# Patient Record
Sex: Male | Born: 1960 | Race: White | Hispanic: No | Marital: Single | Smoking: Never smoker
Health system: Southern US, Community
[De-identification: ages and names within clinical notes are randomized; demographics above are authoritative.]

## PROBLEM LIST (undated history)

## (undated) DIAGNOSIS — I272 Pulmonary hypertension, unspecified: Secondary | ICD-10-CM

## (undated) DIAGNOSIS — I10 Essential (primary) hypertension: Secondary | ICD-10-CM

## (undated) DIAGNOSIS — E119 Type 2 diabetes mellitus without complications: Secondary | ICD-10-CM

## (undated) DIAGNOSIS — G4733 Obstructive sleep apnea (adult) (pediatric): Secondary | ICD-10-CM

## (undated) DIAGNOSIS — I38 Endocarditis, valve unspecified: Secondary | ICD-10-CM

## (undated) DIAGNOSIS — I428 Other cardiomyopathies: Secondary | ICD-10-CM

## (undated) DIAGNOSIS — I453 Trifascicular block: Secondary | ICD-10-CM

## (undated) DIAGNOSIS — I4892 Unspecified atrial flutter: Secondary | ICD-10-CM

## (undated) DIAGNOSIS — I519 Heart disease, unspecified: Secondary | ICD-10-CM

## (undated) DIAGNOSIS — N289 Disorder of kidney and ureter, unspecified: Secondary | ICD-10-CM

## (undated) HISTORY — DX: Heart disease, unspecified: I51.9

## (undated) HISTORY — DX: Morbid (severe) obesity due to excess calories: E66.01

## (undated) HISTORY — DX: Unspecified atrial flutter: I48.92

## (undated) HISTORY — DX: Endocarditis, valve unspecified: I38

## (undated) HISTORY — DX: Essential (primary) hypertension: I10

## (undated) HISTORY — DX: Disorder of kidney and ureter, unspecified: N28.9

## (undated) HISTORY — DX: Pulmonary hypertension, unspecified: I27.20

## (undated) HISTORY — DX: Trifascicular block: I45.3

## (undated) HISTORY — DX: Type 2 diabetes mellitus without complications: E11.9

## (undated) HISTORY — DX: Obstructive sleep apnea (adult) (pediatric): G47.33

## (undated) HISTORY — DX: Other cardiomyopathies: I42.8

---

## 2004-10-31 ENCOUNTER — Ambulatory Visit: Payer: Self-pay | Admitting: Cardiology

## 2006-03-12 ENCOUNTER — Ambulatory Visit: Payer: Self-pay | Admitting: Cardiology

## 2006-03-15 ENCOUNTER — Ambulatory Visit: Payer: Self-pay | Admitting: Cardiology

## 2006-03-22 ENCOUNTER — Ambulatory Visit: Payer: Self-pay | Admitting: Cardiology

## 2006-03-29 ENCOUNTER — Ambulatory Visit: Payer: Self-pay | Admitting: Cardiology

## 2006-04-05 ENCOUNTER — Ambulatory Visit: Payer: Self-pay | Admitting: Cardiology

## 2006-04-12 ENCOUNTER — Ambulatory Visit: Payer: Self-pay | Admitting: Cardiology

## 2006-04-20 ENCOUNTER — Ambulatory Visit: Payer: Self-pay | Admitting: Cardiology

## 2006-04-26 ENCOUNTER — Ambulatory Visit: Payer: Self-pay | Admitting: Cardiology

## 2006-05-03 ENCOUNTER — Ambulatory Visit: Payer: Self-pay | Admitting: Cardiology

## 2006-05-10 ENCOUNTER — Ambulatory Visit: Payer: Self-pay | Admitting: Cardiology

## 2006-05-17 ENCOUNTER — Ambulatory Visit: Payer: Self-pay | Admitting: Cardiology

## 2006-05-24 ENCOUNTER — Ambulatory Visit: Payer: Self-pay | Admitting: Cardiology

## 2006-05-28 ENCOUNTER — Ambulatory Visit: Payer: Self-pay | Admitting: Cardiology

## 2006-05-31 ENCOUNTER — Ambulatory Visit: Payer: Self-pay | Admitting: Cardiology

## 2006-06-02 ENCOUNTER — Ambulatory Visit: Payer: Self-pay | Admitting: Cardiology

## 2006-06-09 ENCOUNTER — Ambulatory Visit: Payer: Self-pay | Admitting: Cardiology

## 2006-06-23 ENCOUNTER — Ambulatory Visit: Payer: Self-pay | Admitting: Cardiology

## 2006-07-07 ENCOUNTER — Ambulatory Visit: Payer: Self-pay | Admitting: Cardiology

## 2006-07-14 ENCOUNTER — Ambulatory Visit: Payer: Self-pay | Admitting: Cardiology

## 2006-07-28 ENCOUNTER — Ambulatory Visit: Payer: Self-pay | Admitting: Cardiology

## 2006-08-04 ENCOUNTER — Ambulatory Visit: Payer: Self-pay | Admitting: Cardiology

## 2006-08-16 ENCOUNTER — Ambulatory Visit: Payer: Self-pay | Admitting: Cardiology

## 2006-08-27 ENCOUNTER — Ambulatory Visit: Payer: Self-pay | Admitting: Cardiology

## 2006-09-06 ENCOUNTER — Encounter: Payer: Self-pay | Admitting: Cardiology

## 2006-09-07 ENCOUNTER — Ambulatory Visit: Payer: Self-pay | Admitting: Cardiology

## 2006-09-17 ENCOUNTER — Ambulatory Visit: Payer: Self-pay | Admitting: Cardiology

## 2006-09-27 ENCOUNTER — Ambulatory Visit: Payer: Self-pay | Admitting: Cardiology

## 2006-10-01 ENCOUNTER — Ambulatory Visit: Payer: Self-pay | Admitting: Cardiology

## 2006-10-01 ENCOUNTER — Inpatient Hospital Stay (HOSPITAL_BASED_OUTPATIENT_CLINIC_OR_DEPARTMENT_OTHER): Admission: RE | Admit: 2006-10-01 | Discharge: 2006-10-01 | Payer: Self-pay | Admitting: Cardiology

## 2006-10-15 ENCOUNTER — Ambulatory Visit: Payer: Self-pay | Admitting: Cardiology

## 2006-11-08 ENCOUNTER — Ambulatory Visit: Payer: Self-pay | Admitting: Cardiology

## 2006-11-15 ENCOUNTER — Ambulatory Visit: Payer: Self-pay | Admitting: Cardiology

## 2006-12-01 ENCOUNTER — Ambulatory Visit: Payer: Self-pay | Admitting: Cardiology

## 2006-12-22 ENCOUNTER — Ambulatory Visit: Payer: Self-pay | Admitting: Cardiology

## 2007-01-05 ENCOUNTER — Ambulatory Visit: Payer: Self-pay | Admitting: Physician Assistant

## 2007-01-13 ENCOUNTER — Ambulatory Visit: Payer: Self-pay | Admitting: Physician Assistant

## 2007-01-28 ENCOUNTER — Ambulatory Visit: Payer: Self-pay | Admitting: Physician Assistant

## 2007-02-07 ENCOUNTER — Ambulatory Visit: Payer: Self-pay | Admitting: Cardiology

## 2007-02-16 ENCOUNTER — Ambulatory Visit: Payer: Self-pay | Admitting: Cardiology

## 2007-02-21 ENCOUNTER — Ambulatory Visit: Payer: Self-pay | Admitting: Physician Assistant

## 2007-03-07 ENCOUNTER — Ambulatory Visit: Payer: Self-pay | Admitting: Cardiology

## 2007-04-04 ENCOUNTER — Ambulatory Visit: Payer: Self-pay | Admitting: Cardiology

## 2007-05-04 ENCOUNTER — Ambulatory Visit: Payer: Self-pay | Admitting: Physician Assistant

## 2007-05-18 ENCOUNTER — Ambulatory Visit: Payer: Self-pay | Admitting: Cardiology

## 2007-06-08 ENCOUNTER — Ambulatory Visit: Payer: Self-pay | Admitting: Cardiology

## 2007-06-22 ENCOUNTER — Ambulatory Visit: Payer: Self-pay | Admitting: Cardiology

## 2007-07-13 ENCOUNTER — Ambulatory Visit: Payer: Self-pay | Admitting: Cardiology

## 2007-08-10 ENCOUNTER — Ambulatory Visit: Payer: Self-pay | Admitting: Cardiology

## 2007-09-15 ENCOUNTER — Ambulatory Visit: Payer: Self-pay | Admitting: Cardiology

## 2007-10-13 ENCOUNTER — Ambulatory Visit: Payer: Self-pay | Admitting: Cardiology

## 2007-11-03 ENCOUNTER — Ambulatory Visit: Payer: Self-pay | Admitting: Cardiology

## 2008-02-10 ENCOUNTER — Ambulatory Visit: Payer: Self-pay | Admitting: Cardiology

## 2008-03-05 ENCOUNTER — Ambulatory Visit: Payer: Self-pay | Admitting: Cardiology

## 2008-03-23 ENCOUNTER — Ambulatory Visit: Payer: Self-pay | Admitting: Cardiology

## 2008-04-24 ENCOUNTER — Ambulatory Visit: Payer: Self-pay | Admitting: Cardiology

## 2008-05-08 ENCOUNTER — Ambulatory Visit: Payer: Self-pay | Admitting: Cardiology

## 2008-06-01 ENCOUNTER — Ambulatory Visit: Payer: Self-pay | Admitting: Cardiology

## 2008-06-22 ENCOUNTER — Ambulatory Visit: Payer: Self-pay | Admitting: Cardiology

## 2008-06-25 ENCOUNTER — Ambulatory Visit: Payer: Self-pay | Admitting: Cardiology

## 2008-06-29 ENCOUNTER — Ambulatory Visit: Payer: Self-pay | Admitting: Cardiology

## 2008-07-20 ENCOUNTER — Ambulatory Visit: Payer: Self-pay | Admitting: Cardiology

## 2008-08-21 ENCOUNTER — Ambulatory Visit: Payer: Self-pay | Admitting: Cardiology

## 2008-08-22 ENCOUNTER — Encounter: Payer: Self-pay | Admitting: Cardiology

## 2008-08-25 ENCOUNTER — Encounter: Payer: Self-pay | Admitting: Cardiology

## 2008-08-31 ENCOUNTER — Ambulatory Visit: Payer: Self-pay | Admitting: Cardiology

## 2008-09-04 ENCOUNTER — Ambulatory Visit: Payer: Self-pay | Admitting: Cardiology

## 2008-09-10 ENCOUNTER — Encounter: Payer: Self-pay | Admitting: Cardiology

## 2008-09-12 ENCOUNTER — Ambulatory Visit: Payer: Self-pay | Admitting: Cardiology

## 2008-10-02 ENCOUNTER — Ambulatory Visit: Payer: Self-pay | Admitting: Cardiology

## 2008-10-18 ENCOUNTER — Ambulatory Visit: Payer: Self-pay | Admitting: Cardiology

## 2008-10-23 ENCOUNTER — Ambulatory Visit: Payer: Self-pay | Admitting: Cardiology

## 2008-11-13 ENCOUNTER — Ambulatory Visit: Payer: Self-pay | Admitting: Cardiology

## 2008-11-15 ENCOUNTER — Encounter: Payer: Self-pay | Admitting: Cardiology

## 2008-12-04 ENCOUNTER — Ambulatory Visit: Payer: Self-pay | Admitting: Cardiology

## 2008-12-18 ENCOUNTER — Ambulatory Visit: Payer: Self-pay | Admitting: Cardiology

## 2008-12-24 ENCOUNTER — Encounter: Payer: Self-pay | Admitting: Cardiology

## 2009-01-15 ENCOUNTER — Ambulatory Visit: Payer: Self-pay | Admitting: Cardiology

## 2009-02-05 ENCOUNTER — Ambulatory Visit: Payer: Self-pay | Admitting: Cardiology

## 2009-03-05 ENCOUNTER — Ambulatory Visit: Payer: Self-pay

## 2009-04-01 ENCOUNTER — Encounter: Payer: Self-pay | Admitting: *Deleted

## 2009-04-02 ENCOUNTER — Ambulatory Visit: Payer: Self-pay | Admitting: Cardiology

## 2009-04-02 LAB — CONVERTED CEMR LAB: POC INR: 2.5

## 2009-04-30 ENCOUNTER — Ambulatory Visit: Payer: Self-pay | Admitting: Cardiology

## 2009-05-06 DIAGNOSIS — I10 Essential (primary) hypertension: Secondary | ICD-10-CM

## 2009-05-06 DIAGNOSIS — I429 Cardiomyopathy, unspecified: Secondary | ICD-10-CM

## 2009-05-06 DIAGNOSIS — I4892 Unspecified atrial flutter: Secondary | ICD-10-CM

## 2009-05-17 ENCOUNTER — Ambulatory Visit: Payer: Self-pay | Admitting: Cardiology

## 2009-05-17 DIAGNOSIS — I872 Venous insufficiency (chronic) (peripheral): Secondary | ICD-10-CM | POA: Insufficient documentation

## 2009-05-24 ENCOUNTER — Encounter: Payer: Self-pay | Admitting: Cardiology

## 2009-05-28 ENCOUNTER — Ambulatory Visit: Payer: Self-pay | Admitting: Cardiology

## 2009-05-28 LAB — CONVERTED CEMR LAB: POC INR: 2.2

## 2009-06-04 ENCOUNTER — Encounter (INDEPENDENT_AMBULATORY_CARE_PROVIDER_SITE_OTHER): Payer: Self-pay | Admitting: *Deleted

## 2009-06-25 ENCOUNTER — Ambulatory Visit: Payer: Self-pay | Admitting: Cardiology

## 2009-06-25 LAB — CONVERTED CEMR LAB: POC INR: 2.6

## 2009-07-26 ENCOUNTER — Ambulatory Visit: Payer: Self-pay | Admitting: Cardiology

## 2009-07-26 LAB — CONVERTED CEMR LAB: POC INR: 2.1

## 2009-09-03 ENCOUNTER — Ambulatory Visit: Payer: Self-pay | Admitting: Cardiology

## 2009-10-01 ENCOUNTER — Ambulatory Visit: Payer: Self-pay | Admitting: Cardiology

## 2009-10-22 ENCOUNTER — Ambulatory Visit: Payer: Self-pay | Admitting: Cardiology

## 2009-10-22 LAB — CONVERTED CEMR LAB: POC INR: 3.5

## 2009-11-08 ENCOUNTER — Ambulatory Visit: Payer: Self-pay | Admitting: Cardiology

## 2009-12-10 ENCOUNTER — Ambulatory Visit: Payer: Self-pay | Admitting: Cardiology

## 2009-12-18 ENCOUNTER — Encounter: Payer: Self-pay | Admitting: Cardiology

## 2009-12-24 ENCOUNTER — Ambulatory Visit: Payer: Self-pay | Admitting: Cardiology

## 2009-12-25 ENCOUNTER — Encounter (INDEPENDENT_AMBULATORY_CARE_PROVIDER_SITE_OTHER): Payer: Self-pay | Admitting: *Deleted

## 2010-01-07 ENCOUNTER — Ambulatory Visit: Payer: Self-pay | Admitting: Cardiology

## 2010-01-07 LAB — CONVERTED CEMR LAB: POC INR: 2.8

## 2010-01-15 ENCOUNTER — Encounter (INDEPENDENT_AMBULATORY_CARE_PROVIDER_SITE_OTHER): Payer: Self-pay | Admitting: *Deleted

## 2010-02-07 ENCOUNTER — Ambulatory Visit: Payer: Self-pay | Admitting: Cardiology

## 2010-03-07 ENCOUNTER — Ambulatory Visit: Payer: Self-pay | Admitting: Cardiology

## 2010-03-07 LAB — CONVERTED CEMR LAB: POC INR: 3

## 2010-04-04 ENCOUNTER — Ambulatory Visit: Payer: Self-pay | Admitting: Cardiology

## 2010-04-04 LAB — CONVERTED CEMR LAB: POC INR: 3

## 2010-05-02 ENCOUNTER — Ambulatory Visit: Payer: Self-pay | Admitting: Cardiology

## 2010-05-02 LAB — CONVERTED CEMR LAB: POC INR: 3.1

## 2010-06-03 ENCOUNTER — Ambulatory Visit: Payer: Self-pay | Admitting: Cardiology

## 2010-06-03 ENCOUNTER — Encounter: Payer: Self-pay | Admitting: Cardiology

## 2010-06-03 DIAGNOSIS — I4891 Unspecified atrial fibrillation: Secondary | ICD-10-CM

## 2010-06-04 ENCOUNTER — Encounter (INDEPENDENT_AMBULATORY_CARE_PROVIDER_SITE_OTHER): Payer: Self-pay | Admitting: *Deleted

## 2010-06-06 ENCOUNTER — Encounter: Payer: Self-pay | Admitting: Cardiology

## 2010-06-20 ENCOUNTER — Ambulatory Visit: Payer: Self-pay | Admitting: Cardiology

## 2010-07-01 ENCOUNTER — Encounter: Payer: Self-pay | Admitting: Cardiology

## 2010-07-04 ENCOUNTER — Encounter (INDEPENDENT_AMBULATORY_CARE_PROVIDER_SITE_OTHER): Payer: Self-pay | Admitting: *Deleted

## 2010-09-16 NOTE — Letter (Signed)
Summary: Appointment -missed  [facility] at [facility] [address]   [place], [place] [address]   Phone: [phone]  Fax: [phone]     July 01, 2010 MRN: [id number]     HARJOT DIBELLO [address] [place], [place]  [address]     Dear Mr. LEDFORD,  Our records indicate you missed your appointment on July 01, 2010                        with coumadin clinic.   It is very important that we reach you to reschedule this appointment. We look forward to participating in your health care needs.   Please contact us at the number listed above at your earliest convenience to reschedule this appointment.   Sincerely,    Glass blower/designer

## 2010-09-16 NOTE — Medication Information (Signed)
Summary: RX Folder/ PRADAXA  RX Folder/ PRADAXA   Imported By: Dorise Hiss 06/09/2010 15:18:21  _____________________________________________________________________  External Attachment:    Type:   Image     Comment:   External Document

## 2010-09-16 NOTE — Medication Information (Signed)
Summary: ccr-lr  Anticoagulant Therapy  Managed by: Vashti Hey, RN Referring MD: Lewayne Bunting PCP: Larina Earthly MD: Andee Lineman MD, Michelle Piper Indication 1: Atrial Fibrillation (ICD-427.31) [facility] [place] Site: [facility] INR POC 3.0  Dietary changes: no    Health status changes: no    Bleeding/hemorrhagic complications: no    Recent/future hospitalizations: no    Any changes in medication regimen? no    Recent/future dental: no  Any missed doses?: no       Is patient compliant with meds? yes       Allergies: No Known Drug Allergies  Anticoagulation Management History:      The patient is taking warfarin and comes in today for a routine follow up visit.  Negative risk factors for bleeding include an age less than 55 years old.  The bleeding index is 'low risk'.  Positive CHADS2 values include History of HTN.  Negative CHADS2 values include Age > 37 years old.  The start date was 07/17/2006.  Anticoagulation responsible provider: Andee Lineman MD, Michelle Piper.  INR POC: 3.0.  Cuvette Lot#: [id number].  Exp: 10/11.    Anticoagulation Management Assessment/Plan:      The patient's current anticoagulation dose is Warfarin sodium 5 mg tabs: take 2 tablets by mouth once daily as directed.  The target INR is 2 - 3.  The next INR is due 10/01/2009.  Anticoagulation instructions were given to patient.  Results were reviewed/authorized by Vashti Hey, RN.  He was notified by Vashti Hey RN.         Prior Anticoagulation Instructions: INR 2.1 Continue coumadin 10mg  once daily except 12.5mg  on Mondays  Current Anticoagulation Instructions: INR 3.0 Continue coumadin 10mg  once daily except 12.5mg  on Mondays

## 2010-09-16 NOTE — Medication Information (Signed)
Summary: ccr-lr  Anticoagulant Therapy  Managed by: Vashti Hey, RN Referring MD: Lewayne Bunting PCP: Larina Earthly MD: Andee Lineman MD, Michelle Piper Indication 1: Atrial Fibrillation (ICD-427.31) Lab Used: [facility] [place] Site: [facility] Clinic INR POC 2.9  Dietary changes: no    Health status changes: no    Bleeding/hemorrhagic complications: no    Recent/future hospitalizations: no    Any changes in medication regimen? no    Recent/future dental: no  Any missed doses?: no       Is patient compliant with meds? yes       Allergies: No Known Drug Allergies  Anticoagulation Management History:      The patient is taking warfarin and comes in today for a routine follow up visit.  Negative risk factors for bleeding include an age less than 32 years old.  The bleeding index is 'low risk'.  Positive CHADS2 values include History of HTN.  Negative CHADS2 values include Age > 21 years old.  The start date was 07/17/2006.  Anticoagulation responsible provider: Andee Lineman MD, Michelle Piper.  INR POC: 2.9.  Cuvette Lot#: [id number].  Exp: 03/2011.    Anticoagulation Management Assessment/Plan:      The patient's current anticoagulation dose is Warfarin sodium 5 mg tabs: take 2 tablets by mouth once daily as directed.  The target INR is 2 - 3.  The next INR is due 07/01/2010.  Anticoagulation instructions were given to patient.  Results were reviewed/authorized by Vashti Hey, RN.  He was notified by Vashti Hey RN.         Prior Anticoagulation Instructions: INR 3.1 Decrease dose to 10mg  once daily except 12.5mg  on Tuesdays  Current Anticoagulation Instructions: INR 2.9 Continue coumadin 10mg  once daily except 12.5mg  on Tuesdays

## 2010-09-16 NOTE — Letter (Signed)
Summary: Generic Engineer, agricultural at [facility] [address]   [place], [place] [address]   Phone: [phone]  Fax: [phone]        June 04, 2010 MRN: [id number]    DEAVON PODGORSKI [address] [place], [place]  [address]    Dear Mr. RUBEN,   Zigmund Gottron is a copy of your order for the 2 D echo ordered by Dr. Andee Lineman.  If this date and time are not suitable with you please call our office and I will be happy to re-schedule this time for you.        Sincerely,  Zachary George Patient Care Coordinator

## 2010-09-16 NOTE — Medication Information (Signed)
Summary: ccr-at [facility] appt-lr  Anticoagulant Therapy  Managed by: Vashti Hey, RN Referring MD: Lewayne Bunting PCP: Larina Earthly MD: Andee Lineman MD, Michelle Piper Indication 1: Atrial Fibrillation (ICD-427.31) Lab Used: [facility] [place] Site: [facility] INR POC 1.4  Dietary changes: no    Health status changes: no    Bleeding/hemorrhagic complications: no    Recent/future hospitalizations: no    Any changes in medication regimen? no    Recent/future dental: no  Any missed doses?: no       Is patient compliant with meds? yes       Allergies: No Known Drug Allergies  Anticoagulation Management History:      The patient is taking warfarin and comes in today for a routine follow up visit.  Negative risk factors for bleeding include an age less than 24 years old.  The bleeding index is 'low risk'.  Positive CHADS2 values include History of HTN.  Negative CHADS2 values include Age > 60 years old.  The start date was 07/17/2006.  Anticoagulation responsible provider: Andee Lineman MD, Michelle Piper.  INR POC: 1.4.  Cuvette Lot#: [id number].  Exp: 10/11.    Anticoagulation Management Assessment/Plan:      The patient's current anticoagulation dose is Warfarin sodium 5 mg tabs: take 2 tablets by mouth once daily as directed.  The target INR is 2 - 3.  The next INR is due 12/24/2009.  Anticoagulation instructions were given to patient.  Results were reviewed/authorized by Vashti Hey, RN.  He was notified by Vashti Hey RN.         Prior Anticoagulation Instructions: INR 2.7 Continue coumadin 10mg  once daily except 5mg  on Wednesdays and Saturdays  Current Anticoagulation Instructions: INR 1.4 Take coumadin 15mg  tonight then increase dose to 10mg  once daily

## 2010-09-16 NOTE — Medication Information (Signed)
Summary: ccr-lr  Anticoagulant Therapy  Managed by: Vashti Hey, RN Referring MD: Lewayne Bunting PCP: Larina Earthly MD: Antoine Poche MD, Fayrene Fearing Indication 1: Atrial Fibrillation (ICD-427.31) Lab Used: [facility] [place] Site: [facility] Care Clinic INR POC 3.5  Dietary changes: no    Health status changes: no    Bleeding/hemorrhagic complications: no    Recent/future hospitalizations: no    Any changes in medication regimen? no    Recent/future dental: no  Any missed doses?: no       Is patient compliant with meds? yes       Allergies: No Known Drug Allergies  Anticoagulation Management History:      The patient is taking warfarin and comes in today for a routine follow up visit.  Negative risk factors for bleeding include an age less than 52 years old.  The bleeding index is 'low risk'.  Positive CHADS2 values include History of HTN.  Negative CHADS2 values include Age > 52 years old.  The start date was 07/17/2006.  Anticoagulation responsible provider: Antoine Poche MD, Fayrene Fearing.  INR POC: 3.5.  Cuvette Lot#: [id number].  Exp: 10/11.    Anticoagulation Management Assessment/Plan:      The patient's current anticoagulation dose is Warfarin sodium 5 mg tabs: take 2 tablets by mouth once daily as directed.  The target INR is 2 - 3.  The next INR is due 10/22/2009.  Anticoagulation instructions were given to patient.  Results were reviewed/authorized by Vashti Hey, RN.  He was notified by Vashti Hey RN.         Prior Anticoagulation Instructions: INR 3.0 Continue coumadin 10mg  once daily except 12.5mg  on Mondays  Current Anticoagulation Instructions: INR 3.5 Pt has taken coumadin today Take coumadin 2.5mg  tomorrow, then decrease dose to 10mg  once daily

## 2010-09-16 NOTE — Medication Information (Signed)
Summary: ccr-lr  Anticoagulant Therapy  Managed by: Weston Brass, PharmD Referring MD: Lewayne Bunting PCP: Larina Earthly MD: Andee Lineman MD, Michelle Piper Indication 1: Atrial Fibrillation (ICD-427.31) Lab Used: [facility] [place] Site: [facility] Care Clinic INR POC 2.4  Dietary changes: no    Health status changes: no    Bleeding/hemorrhagic complications: no    Recent/future hospitalizations: no    Any changes in medication regimen? no    Recent/future dental: no  Any missed doses?: no       Is patient compliant with meds? yes       Allergies: No Known Drug Allergies  Anticoagulation Management History:      The patient is taking warfarin and comes in today for a routine follow up visit.  Negative risk factors for bleeding include an age less than 57 years old.  The bleeding index is 'low risk'.  Positive CHADS2 values include History of HTN.  Negative CHADS2 values include Age > 79 years old.  The start date was 07/17/2006.  Anticoagulation responsible provider: Andee Lineman MD, Michelle Piper.  INR POC: 2.4.  Cuvette Lot#: [id number].  Exp: 03/2011.    Anticoagulation Management Assessment/Plan:      The patient's current anticoagulation dose is Warfarin sodium 5 mg tabs: take 2 tablets by mouth once daily as directed.  The target INR is 2 - 3.  The next INR is due 03/07/2010.  Anticoagulation instructions were given to patient.  Results were reviewed/authorized by Weston Brass, PharmD.  He was notified by Weston Brass PharmD.         Prior Anticoagulation Instructions: INR 2.8 Continue coumadin 10mg  once daily except 12.5mg  on Tuesdays and Fridays  Current Anticoagulation Instructions: INR 2.4  Continue same dose of 2 tablets every day except 2 1/2 tablets on Tuesday and Friday.

## 2010-09-16 NOTE — Medication Information (Signed)
Summary: Coumadin Clinic  Anticoagulant Therapy  Managed by: Inactive Referring MD: Lewayne Bunting PCP: Larina Earthly MD: Andee Lineman MD, Michelle Piper Indication 1: Atrial Fibrillation (ICD-427.31) Lab Used: [facility] [place] Site: [facility] Care Clinic          Comments: Pt switched to Pradaxa per Dr Andee Lineman.  Allergies: No Known Drug Allergies  Anticoagulation Management History:      Negative risk factors for bleeding include an age less than 23 years old.  The bleeding index is 'low risk'.  Positive CHADS2 values include History of HTN.  Negative CHADS2 values include Age > 70 years old.  The start date was 07/17/2006.  Anticoagulation responsible provider: Andee Lineman MD, Michelle Piper.  Exp: 03/2011.    Anticoagulation Management Assessment/Plan:      The patient's current anticoagulation dose is Warfarin sodium 5 mg tabs: take 2 tablets by mouth once daily as directed.  The target INR is 2 - 3.  The next INR is due 07/01/2010.  Anticoagulation instructions were given to patient.  Results were reviewed/authorized by Inactive.         Prior Anticoagulation Instructions: INR 2.9 Continue coumadin 10mg  once daily except 12.5mg  on Tuesdays

## 2010-09-16 NOTE — Letter (Signed)
Summary: Appointment- Rescheduled  [facility] at [facility] [address]   [place], [place] [address]   Phone: [phone]  Fax: [phone]     January 15, 2010 MRN: [id number]     [name] [address] [place], [place]  [address]     Dear Mr. CROMARTIE,   Due to a change in our office schedule, your appointment on February 04, 2010 at 9:15 a.m. must be changed.  Your new appointment is February 07, 2010 at 9:15 a.m.   We look forward to participating in your health care needs.       Sincerely,  Glass blower/designer

## 2010-09-16 NOTE — Letter (Signed)
Summary: Engineer, materials at [facility]  [address]   [place], [place] [address]   Phone: [phone]  Fax: [phone]        July 04, 2010 MRN: [id number]   Carl ZANE [address] [place], [place]  [address]   Dear Mr. [name],  Your test ordered by Selena Batten has been reviewed by your physician (or physician assistant) and was found to be normal or stable. Your physician (or physician assistant) felt no changes were needed at this time.  __X__ Echocardiogram  ____ Cardiac Stress Test  ____ Lab Work  ____ Peripheral vascular study of arms, legs or neck  ____ CT scan or X-ray  ____ Lung or Breathing test  ____ Other:   Thank you.   Laray Anger, M.D., F.A.C.C.

## 2010-09-16 NOTE — Medication Information (Signed)
Summary: ccr-lr  Anticoagulant Therapy  Managed by: Vashti Hey, RN Referring MD: Lewayne Bunting PCP: Larina Earthly MD: Andee Lineman MD, Michelle Piper Indication 1: Atrial Fibrillation (ICD-427.31) Lab Used: [facility] [place] Site: [facility] Care Clinic INR POC 2.7  Dietary changes: no    Health status changes: no    Bleeding/hemorrhagic complications: no    Recent/future hospitalizations: no    Any changes in medication regimen? no    Recent/future dental: no  Any missed doses?: no       Is patient compliant with meds? yes       Allergies: No Known Drug Allergies  Anticoagulation Management History:      The patient is taking warfarin and comes in today for a routine follow up visit.  Negative risk factors for bleeding include an age less than 95 years old.  The bleeding index is 'low risk'.  Positive CHADS2 values include History of HTN.  Negative CHADS2 values include Age > 63 years old.  The start date was 07/17/2006.  Anticoagulation responsible provider: Andee Lineman MD, Michelle Piper.  INR POC: 2.7.  Cuvette Lot#: [id number].  Exp: 10/11.    Anticoagulation Management Assessment/Plan:      The patient's current anticoagulation dose is Warfarin sodium 5 mg tabs: take 2 tablets by mouth once daily as directed.  The target INR is 2 - 3.  The next INR is due 12/10/2009.  Anticoagulation instructions were given to patient.  Results were reviewed/authorized by Vashti Hey, RN.  He was notified by Vashti Hey RN.         Prior Anticoagulation Instructions: INR 3.5 Pt has lost 30+ pounds Decrease coumadin to 10mg  qd except 5mg  on Wednesdays and Saturdays   Current Anticoagulation Instructions: INR 2.7 Continue coumadin 10mg  once daily except 5mg  on Wednesdays and Saturdays

## 2010-09-16 NOTE — Medication Information (Signed)
Summary: ccr-lr  Anticoagulant Therapy  Managed by: Vashti Hey, RN Referring MD: Lewayne Bunting PCP: Larina Earthly MD: Andee Lineman MD, Michelle Piper Indication 1: Atrial Fibrillation (ICD-427.31) Lab Used: [facility] [place] Site: [facility] of Care Clinic INR POC 1.7  Dietary changes: no    Health status changes: no    Bleeding/hemorrhagic complications: no    Recent/future hospitalizations: no    Any changes in medication regimen? no    Recent/future dental: no  Any missed doses?: no       Is patient compliant with meds? yes       Allergies: No Known Drug Allergies  Anticoagulation Management History:      The patient is taking warfarin and comes in today for a routine follow up visit.  Negative risk factors for bleeding include an age less than 52 years old.  The bleeding index is 'low risk'.  Positive CHADS2 values include History of HTN.  Negative CHADS2 values include Age > 74 years old.  The start date was 07/17/2006.  Anticoagulation responsible provider: Andee Lineman MD, Michelle Piper.  INR POC: 1.7.  Cuvette Lot#: [id number].  Exp: 10/11.    Anticoagulation Management Assessment/Plan:      The patient's current anticoagulation dose is Warfarin sodium 5 mg tabs: take 2 tablets by mouth once daily as directed.  The target INR is 2 - 3.  The next INR is due 01/07/2010.  Anticoagulation instructions were given to patient.  Results were reviewed/authorized by Vashti Hey, RN.  He was notified by Vashti Hey RN.         Prior Anticoagulation Instructions: INR 1.4 Take coumadin 15mg  tonight then increase dose to 10mg  once daily   Current Anticoagulation Instructions: INR 1.7 Take coumadin 15mg  tonight then increase dose to 10mg  once daily except 12.5mg  on Tuesdays and Fridays

## 2010-09-16 NOTE — Medication Information (Signed)
Summary: ccr-lr  Anticoagulant Therapy  Managed by: Vashti Hey, RN Referring MD: Lewayne Bunting PCP: Larina Earthly MD: Andee Lineman MD, Michelle Piper Indication 1: Atrial Fibrillation (ICD-427.31) Lab Used: [facility] [place] Site: [facility] Clinic INR POC 3.0  Dietary changes: no    Health status changes: no    Bleeding/hemorrhagic complications: no    Recent/future hospitalizations: no    Any changes in medication regimen? no    Recent/future dental: no  Any missed doses?: no       Is patient compliant with meds? yes       Allergies: No Known Drug Allergies  Anticoagulation Management History:      The patient is taking warfarin and comes in today for a routine follow up visit.  Negative risk factors for bleeding include an age less than 65 years old.  The bleeding index is 'low risk'.  Positive CHADS2 values include History of HTN.  Negative CHADS2 values include Age > 3 years old.  The start date was 07/17/2006.  Anticoagulation responsible provider: Andee Lineman MD, Michelle Piper.  INR POC: 3.0.  Cuvette Lot#: [id number].  Exp: 03/2011.    Anticoagulation Management Assessment/Plan:      The patient's current anticoagulation dose is Warfarin sodium 5 mg tabs: take 2 tablets by mouth once daily as directed.  The target INR is 2 - 3.  The next INR is due 05/02/2010.  Anticoagulation instructions were given to patient.  Results were reviewed/authorized by Vashti Hey, RN.  He was notified by Vashti Hey RN.         Prior Anticoagulation Instructions: INR 3.0 Continue coumadin 10mg  once daily except 12.5mg  on Tuesdays and Fridays  Current Anticoagulation Instructions: Same as Prior Instructions.

## 2010-09-16 NOTE — Medication Information (Signed)
Summary: ccr-lr  Anticoagulant Therapy  Managed by: Vashti Hey, RN Referring MD: Lewayne Bunting PCP: Larina Earthly MD: Andee Lineman MD, Michelle Piper Indication 1: Atrial Fibrillation (ICD-427.31) Lab Used: [facility] [place] Site: [facility] Clinic INR POC 3.5  Dietary changes: no    Health status changes: no    Bleeding/hemorrhagic complications: no    Recent/future hospitalizations: no    Any changes in medication regimen? no    Recent/future dental: no  Any missed doses?: no       Is patient compliant with meds? yes       Allergies: No Known Drug Allergies  Anticoagulation Management History:      The patient is taking warfarin and comes in today for a routine follow up visit.  Negative risk factors for bleeding include an age less than 77 years old.  The bleeding index is 'low risk'.  Positive CHADS2 values include History of HTN.  Negative CHADS2 values include Age > 80 years old.  The start date was 07/17/2006.  Anticoagulation responsible provider: Andee Lineman MD, Michelle Piper.  INR POC: 3.5.  Cuvette Lot#: [id number].  Exp: 10/11.    Anticoagulation Management Assessment/Plan:      The patient's current anticoagulation dose is Warfarin sodium 5 mg tabs: take 2 tablets by mouth once daily as directed.  The target INR is 2 - 3.  The next INR is due 11/08/2009.  Anticoagulation instructions were given to patient.  Results were reviewed/authorized by Vashti Hey, RN.  He was notified by Vashti Hey RN.         Prior Anticoagulation Instructions: INR 3.5 Pt has taken coumadin today Take coumadin 2.5mg  tomorrow, then decrease dose to 10mg  once daily   Current Anticoagulation Instructions: INR 3.5 Pt has lost 30+ pounds Decrease coumadin to 10mg  qd except 5mg  on Wednesdays and Saturdays

## 2010-09-16 NOTE — Assessment & Plan Note (Signed)
Summary: 6 MO FU PER APRIL REMINDER-SRS  Medications Added CLONIDINE HCL 0.1 MG TABS (CLONIDINE HCL) take 2 tabs every morning and 1 tab at bedtime JANUVIA 100 MG TABS (SITAGLIPTIN PHOSPHATE) Take 1 tablet by mouth once a day DIOVAN 320 MG TABS (VALSARTAN) Take 1 tablet by mouth once a day AMLODIPINE BESYLATE 10 MG TABS (AMLODIPINE BESYLATE) Take 1 tablet by mouth once a day      Allergies Added: NKDA  Visit Type:  Follow-up Primary Provider:  Sherryll Burger  CC:  follow-up visit.  History of Present Illness: the patient is a 50 year old male with a history of nonischemic cardiac myopathy, hypertension, chronic diastolic heart failure and atrial flutter. The patient also has history of chronic venous insufficiency. Her last office visit he has weeping lesions and significant swelling of the lower extremities. He was given a combination of hydrochlorothiazide and Lasix. He  had a dramatic improvement in his lower extremity edemathe dropped 20 pounds in weight and has increased his exercise tolerance dramatically. He reports no shortness of breath orthopnea PND he reports no palpitations or syncope. The patient states he has felt the best he has felt in a long time. No recent blood work however has been done on high-dose diuretic therapy. EKG today demonstrates that he remains in normal sinus rhythm.  Preventive Screening-Counseling & Management  Alcohol-Tobacco     Smoking Status: never  Current Medications (verified): 1)  Warfarin Sodium 5 Mg Tabs (Warfarin Sodium) .... Take 2 Tablets By Mouth Once Daily As Directed 2)  Klor-Con M20 20 Meq Cr-Tabs (Potassium Chloride Crys Cr) .... Take 1 Tablet By Mouth Three Times A Day 3)  Novolog 100 Unit/ml Soln (Insulin Aspart) .... 35u Subcutaneously Two Times A Day 4)  Lantus 100 Unit/ml Soln (Insulin Glargine) .... 50u Subcutaneously Two Times A Day 5)  Clonidine Hcl 0.1 Mg Tabs (Clonidine Hcl) .... Take 2 Tabs Every Morning and 1 Tab At Bedtime 6)   Gemfibrozil 600 Mg Tabs (Gemfibrozil) .... Take 1 Tablet By Mouth Two Times A Day 7)  Glyburide-Metformin 5-500 Mg Tabs (Glyburide-Metformin) .... Take2 Tablet By Mouth Two Times A Day 8)  Niaspan 500 Mg Cr-Tabs (Niacin (Antihyperlipidemic)) .... Take 1 Tablet By Mouth Once A Day At Bedtime 9)  Furosemide 80 Mg Tabs (Furosemide) .... Take 1&1/2 Tablet By Mouth Two Times A Day 10)  Digoxin 0.25 Mg Tabs (Digoxin) .... Take 1 Tablet By Mouth Once A Day 11)  Allopurinol 100 Mg Tabs (Allopurinol) .... Take 1 Tablet By Mouth Once A Day 12)  Metoprolol Tartrate 100 Mg Tabs (Metoprolol Tartrate) .... Take 1 Tablet By Mouth Two Times A Day 13)  Lipitor 10 Mg Tabs (Atorvastatin Calcium) .... Take 1 Tablet By Mouth Once A Day 14)  Januvia 100 Mg Tabs (Sitagliptin Phosphate) .... Take 1 Tablet By Mouth Once A Day 15)  Hydrochlorothiazide 12.5 Mg Tabs (Hydrochlorothiazide) .... Take 1 Tablet By Mouth Two Times A Day 16)  Diovan 320 Mg Tabs (Valsartan) .... Take 1 Tablet By Mouth Once A Day 17)  Amlodipine Besylate 10 Mg Tabs (Amlodipine Besylate) .... Take 1 Tablet By Mouth Once A Day  Allergies (verified): No Known Drug Allergies  Comments:  Nurse/Medical Assistant: The patient's medications and allergies were reviewed with the patient and were updated in the Medication and Allergy Lists. List reviewed.  Past History:  Past Medical History: Last updated: 05/06/2009 HYPERTENSION, UNSPECIFIED (ICD-401.9) OBESITY-MORBID (>100') (ICD-278.01) ATRIAL FLUTTER (ICD-427.32) CARDIOMYOPATHY, SECONDARY (ICD-425.9) Type 2 diabetes mellitus OSA  Family  History: Last updated: 05/17/2009 noncontributory  Social History: Last updated: 05/06/2009 Disabled  Tobacco Use - No.  Alcohol Use - no  Risk Factors: Smoking Status: never (12/10/2009)  Clinical Review Panels:  Echocardiogram Echocardiogram 1. The estimated ejection fraction is 30-35%.          2. There is moderate to severely decreased left  ventricular systolic         function.          3. There is global hypokinesis of the left ventricle with minor         regional variation.         4. The left atrium is mildly dilated.         5. There is no evidence of aortic regurgitation.         6. There is no evidence of mitral regurgitation.         7. There is no evidence of tricuspid valve regurgitation.         8. The pericardium appears normal.         9. The inferior vena cava appears normal. (06/25/2008)    Review of Systems       The patient complains of weight gain/loss and leg swelling.  The patient denies fatigue, malaise, fever, vision loss, decreased hearing, hoarseness, chest pain, palpitations, shortness of breath, prolonged cough, wheezing, sleep apnea, coughing up blood, abdominal pain, blood in stool, nausea, vomiting, diarrhea, heartburn, incontinence, blood in urine, muscle weakness, joint pain, rash, skin lesions, headache, fainting, dizziness, depression, anxiety, enlarged lymph nodes, easy bruising or bleeding, and environmental allergies.    Vital Signs:  Patient profile:   50 year old male Height:      71 inches Weight:      302 pounds Pulse rate:   80 / minute BP sitting:   153 / 80  (left arm) Cuff size:   large  Vitals Entered By: Carlye Grippe (December 10, 2009 9:33 AM) CC: follow-up visit   Physical Exam  Additional Exam:  General: Well-developed, well-nourished in no distress head: Normocephalic and atraumatic eyes PERRLA/EOMI intact, conjunctiva and lids normal nose: No deformity or lesions mouth normal dentition, normal posterior pharynx neck: Supple, no JVD.  No masses, thyromegaly or abnormal cervical nodes lungs: Normal breath sounds bilaterally without wheezing.  Normal percussion heart: regular rate and rhythm with normal S1 and S2, no S3 or S4.  PMI is normal.  No pathological murmurs abdomen: Normal bowel sounds, abdomen is soft and nontender without masses, organomegaly or hernias  noted.  No hepatosplenomegaly musculoskeletal: Back normal, normal gait muscle strength and tone normal pulsus: Pulse is normal in all 4 extremities Extremities: 1+ peripheral pitting edema, chronic venous insufficiency neurologic: Alert and oriented x 3 skin: Intact without lesions or rashes cervical nodes: No significant adenopathy psychologic: Normal affect    EKG  Procedure date:  12/10/2009  Findings:      normal sinus rhythm normal EKG  Impression & Recommendations:  Problem # 1:  VENOUS INSUFFICIENCY, LEGS (ICD-459.81) patient has significant venous insufficiency of his lower extremity edema has significantly improved with combination of Lopid and Dyazide diuretic. We'll follow up with BMET  Problem # 2:  HYPERTENSION, UNSPECIFIED (ICD-401.9) blood pressure remains poorly controlled increase patient's clonidine 0.2 mg in the morning and 0.1 mg the afternoon His updated medication list for this problem includes:    Clonidine Hcl 0.1 Mg Tabs (Clonidine hcl) .[name]... Take 2 tabs every morning and 1 tab  at bedtime    Furosemide 80 Mg Tabs (Furosemide) .[name]... Take 1&1/2 tablet by mouth two times a day    Metoprolol Tartrate 100 Mg Tabs (Metoprolol tartrate) .[name]... Take 1 tablet by mouth two times a day    Hydrochlorothiazide 12.5 Mg Tabs (Hydrochlorothiazide) .[name]... Take 1 tablet by mouth two times a day    Diovan 320 Mg Tabs (Valsartan) .[name]... Take 1 tablet by mouth once a day    Amlodipine Besylate 10 Mg Tabs (Amlodipine besylate) .[name]... Take 1 tablet by mouth once a day  Future Orders: T-Basic Metabolic Panel [id number]) ... 12/17/2009  Problem # 3:  ATRIAL FLUTTER (ICD-427.32) patient remains in normal sinus rhythm. Continue warfarin His updated medication list for this problem includes:    Warfarin Sodium 5 Mg Tabs (Warfarin sodium) .[name]... Take 2 tablets by mouth once daily as directed    Digoxin 0.25 Mg Tabs (Digoxin) .[name]... Take 1 tablet by mouth once a day    Metoprolol  Tartrate 100 Mg Tabs (Metoprolol tartrate) .[name]... Take 1 tablet by mouth two times a day  Orders: EKG w/ Interpretation (93000)  Problem # 4:  CARDIOMYOPATHY, SECONDARY (ICD-425.9) no evidence of volume overload or heart failure. Continue current medical therapy His updated medication list for this problem includes:    Warfarin Sodium 5 Mg Tabs (Warfarin sodium) .[name]... Take 2 tablets by mouth once daily as directed    Furosemide 80 Mg Tabs (Furosemide) .[name]... Take 1&1/2 tablet by mouth two times a day    Digoxin 0.25 Mg Tabs (Digoxin) .[name]... Take 1 tablet by mouth once a day    Metoprolol Tartrate 100 Mg Tabs (Metoprolol tartrate) .[name]... Take 1 tablet by mouth two times a day    Hydrochlorothiazide 12.5 Mg Tabs (Hydrochlorothiazide) .[name]... Take 1 tablet by mouth two times a day    Diovan 320 Mg Tabs (Valsartan) .[name]... Take 1 tablet by mouth once a day    Amlodipine Besylate 10 Mg Tabs (Amlodipine besylate) .[name]... Take 1 tablet by mouth once a day  Patient Instructions: 1)  Increase Clonidine to 0.2mg  every morning and 0.1mg  at bedtime 2)  Labs:  BMET in one week 3)  Follow up in  6 months Prescriptions: CLONIDINE HCL 0.1 MG TABS (CLONIDINE HCL) take 2 tabs every morning and 1 tab at bedtime  #90 x 6   Entered by:   Hoover Brunette, LPN   Authorized by:   Lewayne Bunting, MD, [facility]   Signed by:   Hoover Brunette, LPN on 08/19/7251   Method used:   Electronically to        [facility] [address] (retail)       [address]       [place], [place]  [address]       Ph: [phone] or [phone]       Fax: [phone]   RxID:   [id number]

## 2010-09-16 NOTE — Assessment & Plan Note (Signed)
Summary: 68month/rm  Medications Added PRADAXA 150 MG CAPS (DABIGATRAN ETEXILATE MESYLATE) Take 1 tablet by mouth two times a day CLONIDINE HCL 0.2 MG TABS (CLONIDINE HCL) Take 1 tablet by mouth two times a day      Allergies Added: NKDA  Visit Type:  Follow-up Primary Provider:  Sherryll Burger   History of Present Illness: the patient is a 50 year old male with a history of nonischemic cardiomyopathy, ejection fraction 30-35%, hypertension, chronic diastolic heart failure and prior history of atrial flutter. Patient also has a history of chronic venous insufficiency. His lower extremity edema has dramatically improved with higher dose Lasix. He also wears compression stockings. He had recent blood work done which showed a normal creatinine and normal potassium despite being on 2 diuretics. He states it is overall doing well he has no chest pain shortness of breath orthopnea PND. The patient requested today in the office to be switched to dabigatran. I discussed with the patient the risk and benefits of the treatment and given his normal renal function he can be started on dabigatran 150 mg p.o. b.i.d. The patient is due for repeat echocardiogram to reassess his ejection fraction.  Preventive Screening-Counseling & Management  Alcohol-Tobacco     Smoking Status: never  Current Medications (verified): 1)  Pradaxa 150 Mg Caps (Dabigatran Etexilate Mesylate) .... Take 1 Tablet By Mouth Two Times A Day 2)  Klor-Con M20 20 Meq Cr-Tabs (Potassium Chloride Crys Cr) .... Take 1 Tablet By Mouth Three Times A Day 3)  Novolog 100 Unit/ml Soln (Insulin Aspart) .... 35u Subcutaneously Two Times A Day 4)  Lantus 100 Unit/ml Soln (Insulin Glargine) .... 50u Subcutaneously Two Times A Day 5)  Clonidine Hcl 0.2 Mg Tabs (Clonidine Hcl) .... Take 1 Tablet By Mouth Two Times A Day 6)  Gemfibrozil 600 Mg Tabs (Gemfibrozil) .... Take 1 Tablet By Mouth Two Times A Day 7)  Glyburide-Metformin 5-500 Mg Tabs  (Glyburide-Metformin) .... Take2 Tablet By Mouth Two Times A Day 8)  Niaspan 500 Mg Cr-Tabs (Niacin (Antihyperlipidemic)) .... Take 1 Tablet By Mouth Once A Day At Bedtime 9)  Furosemide 80 Mg Tabs (Furosemide) .... Take 1&1/2 Tablet By Mouth Two Times A Day 10)  Digoxin 0.25 Mg Tabs (Digoxin) .... Take 1 Tablet By Mouth Once A Day 11)  Allopurinol 100 Mg Tabs (Allopurinol) .... Take 1 Tablet By Mouth Once A Day 12)  Metoprolol Tartrate 100 Mg Tabs (Metoprolol Tartrate) .... Take 1 Tablet By Mouth Two Times A Day 13)  Lipitor 10 Mg Tabs (Atorvastatin Calcium) .... Take 1 Tablet By Mouth Once A Day 14)  Hydrochlorothiazide 12.5 Mg Tabs (Hydrochlorothiazide) .... Take 1 Tablet By Mouth Two Times A Day 15)  Diovan 320 Mg Tabs (Valsartan) .... Take 1 Tablet By Mouth Once A Day 16)  Amlodipine Besylate 10 Mg Tabs (Amlodipine Besylate) .... Take 1 Tablet By Mouth Once A Day  Allergies (verified): No Known Drug Allergies  Comments:  Nurse/Medical Assistant: The patient's medication bottles and allergies were reviewed with the patient and were updated in the Medication and Allergy Lists.  Past History:  Family History: Last updated: 05/17/2009 noncontributory  Social History: Last updated: 05/06/2009 Disabled  Tobacco Use - No.  Alcohol Use - no  Risk Factors: Smoking Status: never (06/03/2010)  Past Medical History: HYPERTENSION, UNSPECIFIED (ICD-401.9) OBESITY-MORBID (>100') (ICD-278.01) ATRIAL FLUTTER (ICD-427.32) CARDIOMYOPATHY, SECONDARY (ICD-425.9) Type 2 diabetes mellitus OSA Laboratory work May 2011 glucose 338, creatinine 1.15, potassium 4.6  Review of Systems  The patient complains of weight gain/loss.  The patient denies fatigue, malaise, fever, vision loss, decreased hearing, hoarseness, chest pain, palpitations, shortness of breath, prolonged cough, wheezing, sleep apnea, coughing up blood, abdominal pain, blood in stool, nausea, vomiting, diarrhea, heartburn,  incontinence, blood in urine, muscle weakness, joint pain, leg swelling, rash, skin lesions, headache, fainting, dizziness, depression, anxiety, enlarged lymph nodes, easy bruising or bleeding, and environmental allergies.    Vital Signs:  Patient profile:   50 year old male Height:      71 inches Weight:      307 pounds BMI:     42.97 Pulse rate:   78 / minute BP sitting:   145 / 79  (left arm) Cuff size:   large  Vitals Entered By: Carlye Grippe (June 03, 2010 10:38 AM)  Nutrition Counseling: Patient's BMI is greater than 25 and therefore counseled on weight management options.  Physical Exam  Additional Exam:  General: Well-developed, well-nourished in no distress head: Normocephalic and atraumatic eyes PERRLA/EOMI intact, conjunctiva and lids normal nose: No deformity or lesions mouth normal dentition, normal posterior pharynx neck: Supple, no JVD.  No masses, thyromegaly or abnormal cervical nodes lungs: Normal breath sounds bilaterally without wheezing.  Normal percussion heart: regular rate and rhythm with normal S1 and S2, no S3 or S4.  PMI is normal.  No pathological murmurs abdomen: Normal bowel sounds, abdomen is soft and nontender without masses, organomegaly or hernias noted.  No hepatosplenomegaly musculoskeletal: Back normal, normal gait muscle strength and tone normal pulsus: Pulse is normal in all 4 extremities Extremities: 1+ peripheral pitting edema, chronic venous insufficiency neurologic: Alert and oriented x 3 skin: Intact without lesions or rashes cervical nodes: No significant adenopathy psychologic: Normal affect    EKG  Procedure date:  06/03/2010  Findings:      normal sinus rhythm. Nonspecific interventricular conduction delay. Heart rate 74  Impression & Recommendations:  Problem # 1:  VENOUS INSUFFICIENCY, LEGS (ICD-459.81) much improved with wearing compression stockings. No lower extremity edema  Problem # 2:  HYPERTENSION,  UNSPECIFIED (ICD-401.9) blood pressure is under reasonable control and we will make no medication changes presently His updated medication list for this problem includes:    Clonidine Hcl 0.2 Mg Tabs (Clonidine hcl) .[name]... Take 1 tablet by mouth two times a day    Furosemide 80 Mg Tabs (Furosemide) .[name]... Take 1&1/2 tablet by mouth two times a day    Metoprolol Tartrate 100 Mg Tabs (Metoprolol tartrate) .[name]... Take 1 tablet by mouth two times a day    Hydrochlorothiazide 12.5 Mg Tabs (Hydrochlorothiazide) .[name]... Take 1 tablet by mouth two times a day    Diovan 320 Mg Tabs (Valsartan) .[name]... Take 1 tablet by mouth once a day    Amlodipine Besylate 10 Mg Tabs (Amlodipine besylate) .[name]... Take 1 tablet by mouth once a day  Problem # 3:  ATRIAL FLUTTER (ICD-427.32) patient remains in normal sinus rhythm.the patient was discontinue Coumadin and switch to dabigatran. I recommended to the patient to stop Coumadin for 5 days and then start dabigatran 150 mg p.o. b.i.d. His updated medication list for this problem includes:    Digoxin 0.25 Mg Tabs (Digoxin) .[name]... Take 1 tablet by mouth once a day    Metoprolol Tartrate 100 Mg Tabs (Metoprolol tartrate) .[name]... Take 1 tablet by mouth two times a day  Orders: EKG w/ Interpretation (93000)  Problem # 4:  CARDIOMYOPATHY, SECONDARY (ICD-425.9) the patient has no heart failure symptoms but will need  a follow up echocardiogram to reassess his ejection fraction His updated medication list for this problem includes:    Furosemide 80 Mg Tabs (Furosemide) .[name]... Take 1&1/2 tablet by mouth two times a day    Digoxin 0.25 Mg Tabs (Digoxin) .[name]... Take 1 tablet by mouth once a day    Metoprolol Tartrate 100 Mg Tabs (Metoprolol tartrate) .[name]... Take 1 tablet by mouth two times a day    Hydrochlorothiazide 12.5 Mg Tabs (Hydrochlorothiazide) .[name]... Take 1 tablet by mouth two times a day    Diovan 320 Mg Tabs (Valsartan) .[name]... Take 1 tablet by mouth once a day    Amlodipine  Besylate 10 Mg Tabs (Amlodipine besylate) .[name]... Take 1 tablet by mouth once a day  Other Orders: 2-D Echocardiogram (2D Echo)  Patient Instructions: 1)  Stop Coumadin 2)  Begin Pradaxa 150mg  two times a day - 5 days after stopping coumadin 3)  Echo 4)  Follow up in  6 months Prescriptions: PRADAXA 150 MG CAPS (DABIGATRAN ETEXILATE MESYLATE) Take 1 tablet by mouth two times a day  #60 x 6   Entered by:   Hoover Brunette, LPN   Authorized by:   Lewayne Bunting, MD, [facility]   Signed by:   Hoover Brunette, LPN on 14/78/2956   Method used:   Electronically to        [facility] [address] (retail)       [address]       [place], [place]  [address]       Ph: [phone] or [phone]       Fax: [phone]   RxID:   [id number]

## 2010-09-16 NOTE — Medication Information (Signed)
Summary: rov/sp  Anticoagulant Therapy  Managed by: Vashti Hey, RN Referring MD: Lewayne Bunting PCP: Larina Earthly MD: Andee Lineman MD, Michelle Piper Indication 1: Atrial Fibrillation (ICD-427.31) [facility] Used[facility] [place] Site: [facility] INR POC 3.0  Dietary changes: no    Health status changes: no    Bleeding/hemorrhagic complications: no    Recent/future hospitalizations: no    Any changes in medication regimen? no    Recent/future dental: no  Any missed doses?: no       Is patient compliant with meds? yes       Allergies: No Known Drug Allergies  Anticoagulation Management History:      The patient is taking warfarin and comes in today for a routine follow up visit.  Negative risk factors for bleeding include an age less than 51 years old.  The bleeding index is 'low risk'.  Positive CHADS2 values include History of HTN.  Negative CHADS2 values include Age > 62 years old.  The start date was 07/17/2006.  Anticoagulation responsible provider: Andee Lineman MD, Michelle Piper.  INR POC: 3.0.  Cuvette Lot#: [id number].  Exp: 03/2011.    Anticoagulation Management Assessment/Plan:      The patient's current anticoagulation dose is Warfarin sodium 5 mg tabs: take 2 tablets by mouth once daily as directed.  The target INR is 2 - 3.  The next INR is due 04/04/2010.  Anticoagulation instructions were given to patient.  Results were reviewed/authorized by Vashti Hey, RN.  He was notified by Vashti Hey RN.         Prior Anticoagulation Instructions: INR 2.4  Continue same dose of 2 tablets every day except 2 1/2 tablets on Tuesday and Friday.   Current Anticoagulation Instructions: INR 3.0 Continue coumadin 10mg  once daily except 12.5mg  on Tuesdays and Fridays

## 2010-09-16 NOTE — Medication Information (Signed)
Summary: ccr-lr  Anticoagulant Therapy  Managed by: Vashti Hey, RN Referring MD: Lewayne Bunting PCP: Larina Earthly MD: Andee Lineman MD, Michelle Piper Indication 1: Atrial Fibrillation (ICD-427.31) Lab Used[facility] [place] Site: [facility] INR POC 3.1  Dietary changes: no    Health status changes: no    Bleeding/hemorrhagic complications: no    Recent/future hospitalizations: no    Any changes in medication regimen? no    Recent/future dental: no  Any missed doses?: no       Is patient compliant with meds? yes       Allergies: No Known Drug Allergies  Anticoagulation Management History:      The patient is taking warfarin and comes in today for a routine follow up visit.  Negative risk factors for bleeding include an age less than 52 years old.  The bleeding index is 'low risk'.  Positive CHADS2 values include History of HTN.  Negative CHADS2 values include Age > 52 years old.  The start date was 07/17/2006.  Anticoagulation responsible Tatanisha Cuthbert: Andee Lineman MD, Michelle Piper.  INR POC: 3.1.  Cuvette Lot#: [id number].  Exp: 03/2011.    Anticoagulation Management Assessment/Plan:      The patient's current anticoagulation dose is Warfarin sodium 5 mg tabs: take 2 tablets by mouth once daily as directed.  The target INR is 2 - 3.  The next INR is due 06/03/2010.  Anticoagulation instructions were given to patient.  Results were reviewed/authorized by Vashti Hey, RN.  He was notified by Vashti Hey RN.         Prior Anticoagulation Instructions: INR 3.0 Continue coumadin 10mg  once daily except 12.5mg  on Tuesdays and Fridays  Current Anticoagulation Instructions: INR 3.1 Decrease dose to 10mg  once daily except 12.5mg  on Tuesdays

## 2010-09-16 NOTE — Letter (Signed)
Summary: Engineer, materials at [facility]  [address]   [place], [place] [address]   Phone: [phone]  Fax: [phone]        Dec 25, 2009 MRN: [id number]   [name] [address] [place], [place]  [address]   Dear Mr. [name],  Your test ordered by [name] has been reviewed by your physician (or physician assistant) and was found to be normal or stable. Your physician (or physician assistant) felt no changes were needed at this time.  ____ Echocardiogram  ____ Cardiac Stress Test  __X__ Lab Work  - poorly controlled blood sugar, follow with primary MD   ____ Peripheral vascular study of arms, legs or neck  ____ CT scan or X-ray  ____ Lung or Breathing test  ____ Other:   Thank you.   Hoover Brunette, LPN    Duane Boston, M.D., F.A.C.C. Thressa Sheller, M.D., F.A.C.C. Oneal Grout, M.D., F.A.C.C. Cheree Ditto, M.D., F.A.C.C. Daiva Nakayama, M.D., F.A.C.C. Kenney Houseman, M.D., F.A.C.C. Jeanne Ivan, PA-C

## 2010-09-16 NOTE — Medication Information (Signed)
Summary: ccr-lr  Anticoagulant Therapy  Managed by: Vashti Hey, RN Referring MD: Lewayne Bunting PCP: Larina Earthly MD: Andee Lineman MD, Michelle Piper Indication 1: Atrial Fibrillation (ICD-427.31) Lab Used: [facility] [place] Site: [facility] Clinic INR POC 2.8  Dietary changes: no    Health status changes: no    Bleeding/hemorrhagic complications: no    Recent/future hospitalizations: no    Any changes in medication regimen? no    Recent/future dental: no  Any missed doses?: no       Is patient compliant with meds? yes       Allergies: No Known Drug Allergies  Anticoagulation Management History:      The patient is taking warfarin and comes in today for a routine follow up visit.  Negative risk factors for bleeding include an age less than 12 years old.  The bleeding index is 'low risk'.  Positive CHADS2 values include History of HTN.  Negative CHADS2 values include Age > 77 years old.  The start date was 07/17/2006.  Anticoagulation responsible provider: Andee Lineman MD, Michelle Piper.  INR POC: 2.8.  Cuvette Lot#: [id number].  Exp: 10/11.    Anticoagulation Management Assessment/Plan:      The patient's current anticoagulation dose is Warfarin sodium 5 mg tabs: take 2 tablets by mouth once daily as directed.  The target INR is 2 - 3.  The next INR is due 02/04/2010.  Anticoagulation instructions were given to patient.  Results were reviewed/authorized by Vashti Hey, RN.  He was notified by Vashti Hey RN.         Prior Anticoagulation Instructions: INR 1.7 Take coumadin 15mg  tonight then increase dose to 10mg  once daily except 12.5mg  on Tuesdays and Fridays  Current Anticoagulation Instructions: INR 2.8 Continue coumadin 10mg  once daily except 12.5mg  on Tuesdays and Fridays

## 2010-12-09 ENCOUNTER — Encounter: Payer: Self-pay | Admitting: *Deleted

## 2010-12-10 ENCOUNTER — Ambulatory Visit (INDEPENDENT_AMBULATORY_CARE_PROVIDER_SITE_OTHER): Payer: Medicare Other | Admitting: Cardiology

## 2010-12-10 ENCOUNTER — Encounter: Payer: Self-pay | Admitting: Cardiology

## 2010-12-10 VITALS — BP 158/78 | HR 84 | Ht 72.0 in | Wt 295.0 lb

## 2010-12-10 DIAGNOSIS — I10 Essential (primary) hypertension: Secondary | ICD-10-CM

## 2010-12-10 DIAGNOSIS — I4892 Unspecified atrial flutter: Secondary | ICD-10-CM

## 2010-12-10 DIAGNOSIS — Z7901 Long term (current) use of anticoagulants: Secondary | ICD-10-CM

## 2010-12-10 DIAGNOSIS — I4891 Unspecified atrial fibrillation: Secondary | ICD-10-CM

## 2010-12-10 DIAGNOSIS — I429 Cardiomyopathy, unspecified: Secondary | ICD-10-CM

## 2010-12-10 MED ORDER — DABIGATRAN ETEXILATE MESYLATE 150 MG PO CAPS: 150.0000 mg | ORAL_CAPSULE | Freq: Two times a day (BID) | ORAL | Status: DC

## 2010-12-10 MED ORDER — DIGOXIN 250 MCG PO TABS: 250.0000 ug | ORAL_TABLET | Freq: Every day | ORAL | Status: DC

## 2010-12-10 MED ORDER — NIACIN ER (ANTIHYPERLIPIDEMIC) 500 MG PO TBCR: 500.0000 mg | EXTENDED_RELEASE_TABLET | Freq: Every day | ORAL | Status: DC

## 2010-12-10 MED ORDER — PRAZOSIN HCL 1 MG PO CAPS: 1.0000 mg | ORAL_CAPSULE | Freq: Two times a day (BID) | ORAL | Status: DC

## 2010-12-10 NOTE — Progress Notes (Signed)
HPI The patient is a 50 year old male with a history of nonischemic cardiomyopathy ejection fraction 30-35%, hypertension chronic diastolic heart failure prior history of atrial flutter. EKG today demonstrates normal sinus rhythm otherwise normal tracing. The patient has history of chronic venous insufficiency causing lower extremity edema which has significantly improved however with high dose Lasix. He also wears compression stockings. Laboratory work has been followed and has a normal potassium despite being on 2 diuretics. 3 previous office visit the patient was also switched to dabigatran 150 mouth twice a day. The patient was scheduled for repeat echocardiogram to reassess his ejection fraction. The patient is doing well. He denies any chest pain. He has no shortness of breath. He has lost 30 pounds in the last year and a half. He has no residual peripheral edema.  No Known Allergies  Current Outpatient Prescriptions on File Prior to Visit  Medication Sig Dispense Refill  . allopurinol (ZYLOPRIM) 100 MG tablet Take 100 mg by mouth daily.        [name] amLODipine (NORVASC) 10 MG tablet Take 10 mg by mouth daily.        [name] atorvastatin (LIPITOR) 10 MG tablet Take 10 mg by mouth daily.        . cloNIDine (CATAPRES) 0.2 MG tablet Take 0.2 mg by mouth 2 (two) times daily.        . furosemide (LASIX) 80 MG tablet Take 120 mg by mouth 2 (two) times daily.        [name] gemfibrozil (LOPID) 600 MG tablet Take 600 mg by mouth 2 (two) times daily before a meal.        . glyBURIDE-metformin (GLUCOVANCE) 5-500 MG per tablet Take 2 tablets by mouth 2 (two) times daily with a meal.        . hydrochlorothiazide (,MICROZIDE/HYDRODIURIL,) 12.5 MG capsule Take 12.5 mg by mouth 2 (two) times daily.        . insulin aspart (NOVOLOG) 100 UNIT/ML injection Inject 20 Units into the skin 2 (two) times daily before a meal.       . insulin glargine (LANTUS) 100 UNIT/ML injection Inject 80 Units into the skin every morning.         . metoprolol (LOPRESSOR) 100 MG tablet Take 100 mg by mouth 2 (two) times daily.        . potassium chloride SA (K-DUR,KLOR-CON) 20 MEQ tablet Take 20 mEq by mouth 3 (three) times daily.        . valsartan (DIOVAN) 320 MG tablet Take 320 mg by mouth daily.        [name] DISCONTD: dabigatran (PRADAXA) 150 MG CAPS Take 150 mg by mouth every 12 (twelve) hours.        [name] DISCONTD: digoxin (LANOXIN) 0.25 MG tablet Take 250 mcg by mouth daily.        [name] DISCONTD: niacin (NIASPAN) 500 MG CR tablet Take 500 mg by mouth at bedtime.          Past Medical History  Diagnosis Date  . Unspecified essential hypertension   . Morbid obesity   . Atrial flutter  10/01/2006     CARDIAC CATHETERIZATION  . Secondary cardiomyopathy, unspecified   . Type II or unspecified type diabetes mellitus without mention of complication, not stated as uncontrolled   . OSA (obstructive sleep apnea)   . Nonischemic cardiomyopathy     ejection fraction 30-35%,   . Shortness of breath   . Acute on chronic systolic heart failure   .  Unspecified disorder of kidney and ureter     No past surgical history on file.  Family History  Problem Relation Age of Onset  . Asthma Father 14    DECEASED    History   Social History  . Marital Status: Married    Spouse Name: N/A    Number of Children: N/A  . Years of Education: N/A   Occupational History  . DISABLED    Social History Main Topics  . Smoking status: Never Smoker   . Smokeless tobacco: Never Used  . Alcohol Use: No  . Drug Use: No  . Sexually Active: Not on file   Other Topics Concern  . Not on file   Social History Narrative  . No narrative on file   Review of systems:Pertinent positives as outlined above. The remainder of the 18  point review of systems is negative  PHYSICAL EXAM BP 158/78  Pulse 84  Ht 6' (1.829 m)  Wt 295 lb (133.811 kg)  BMI 40.01 kg/m2  General: Well-developed, well-nourished in no distress Head: Normocephalic and  atraumatic Eyes:PERRLA/EOMI intact, conjunctiva and lids normal Ears: No deformity or lesions Mouth:normal dentition, normal posterior pharynx Neck: Supple, no JVD.  No masses, thyromegaly or abnormal cervical nodes Lungs: Normal breath sounds bilaterally without wheezing.  Normal percussion Cardiac: regular rate and rhythm with normal S1 and S2, no S3 or S4.  PMI is normal.  No pathological murmurs Abdomen: Normal bowel sounds, abdomen is soft and nontender without masses, organomegaly or hernias noted.  No hepatosplenomegaly MSK: Back normal, normal gait muscle strength and tone normal Vascular: Pulse is normal in all 4 extremities Extremities: No peripheral pitting edema Neurologic: Alert and oriented x 3 Skin: Intact without lesions or rashes Lymphatics: No significant adenopathy Psychologic: Normal affect  ECG: Normal sinus rhythm no acute abnormalities.  ASSESSMENT AND PLAN

## 2010-12-10 NOTE — Assessment & Plan Note (Signed)
Patient remains in normal sinus rhythm. He reports no palpitations.

## 2010-12-10 NOTE — Assessment & Plan Note (Signed)
Blood pressure is poorly controlled. The patient actually confirms that his blood pressure readings have been high at home also. We will start him on praises in 1 mg by mouth twice a day. I've asked the patient to monitor blood pressure and to forward it to our our practice

## 2010-12-10 NOTE — Assessment & Plan Note (Signed)
The patient is doing well. He reports no heart failure symptoms. He has lost a significant amount of weight. He has improved with Lasix.

## 2010-12-10 NOTE — Patient Instructions (Signed)
   Begin Prazosin 1mg  twice a day   Your physician recommends that you go to the [facility] for lab work - CBC, BMET If the results of your test are normal or stable, you will receive a letter.  If they are abnormal, the nurse will contact you by phone. Your physician wants you to follow up in: 6 months.  You will receive a reminder letter in the mail one-two months in advance.  If you don't receive a letter, please call our office to schedule the follow up appointment

## 2010-12-30 NOTE — Assessment & Plan Note (Signed)
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  The patient is status post successful cardioversion in  November 2007 and has maintained normal sinus rhythm.  He denies any  chest pain or shortness of breath.  He is now able to walk 2 miles a  day, a dramatic improvement from last year.  He does this on a daily  basis and walks at the track.  He denies any chest pain, orthopnea, PND,  palpitations, or syncope.   MEDICATIONS:  Listed in the chart and include from a cardiac perspective  Diovan 320 p.o. daily, Lasix 120 mg p.o. b.i.d., warfarin as directed,  Lipitor 10 mg p.o. daily, digoxin 0.25 mg p.o. daily, metoprolol 200 mg  p.o. daily, and amlodipine 5 mg daily.   PHYSICAL EXAMINATION:  VITAL SIGNS:  Blood pressure 128/84, heart rate  117, and weight is 309 pounds.  NECK:  Normal carotid upstrokes.  No carotid bruits.  LUNGS:  Clear breath sounds bilaterally.  HEART:  Regular rate and rhythm.  Normal sinus rhythm.  No murmurs, rub,  or gallops.  ABDOMEN:  Soft, nontender, no rebound or guarding.  Good bowel sounds.  EXTREMITIES:  No cyanosis, clubbing, or edema.   PROBLEM LIST:  1. Nonischemic cardiomyopathy.      a.     Normal coronary arteries by catheterization.      b.     Ejection fraction 30%.  2. History of atrial flutter status post successful cardioversion,      November 2007.  3. Chronic Coumadin anticoagulation.  4. CHADS score 3.  5. Obstructive sleep apnea, on CPAP.  6. Morbid obesity.  7. Type 2 diabetes mellitus.  8. Hypertension,  uncontrolled.   PLAN:  1. The patient's blood pressure repeat was 175/95 and it is largely      uncontrolled.  I have increased his Norvasc to 10 mg p.o. daily.  2. Continues his current medical regimen with close follow up of blood      pressure with possible      change of Lasix to hydrochlorothiazide for better blood pressure      control.  3. The patient will have laboratory work done.  This can be done at      next visit with CBC, CMET and thyroid function study as well as      lipid panel.     Learta Codding, MD,FACC  Electronically Signed    GED/MedQ  DD: 02/10/2008  DT: 02/11/2008  Job #: [id number]

## 2010-12-30 NOTE — Assessment & Plan Note (Signed)
[facility]                          [facility] NOTE   NAME:[name], [name]                      MRN:          [id number]  DATE:09/04/2008                            DOB:          11/29/1960    HISTORY OF PRESENT ILLNESS:  The patient is a 50 year old male with  history of nonischemic cardiomyopathy.  Ejection fraction was recently  noted at 35% by echocardiogram in November 2009.  He also has a history  of atrial flutter.  He has maintained normal sinus rhythm after  cardioversion.  The patient states that over the last 3 weeks he  developed congestion.  Ever since that time he felt he has put on weight  and indeed his weight has increased by almost 26 pounds since his last  office visit.  He is feeling more short of breath.  He has orthopnea and  PND.  His abdominal girth has increased and he has significant lower  extremity edema.  He went to see Dr. Sherril Croon and he was switched to  [facility], but unfortunately he has not had much benefit in regards to  diuresis.  He used to be on Lasix 120 mg twice a day prior to that.   MEDICATIONS:  1. NovoLog 25 units daily.  2. Diovan 320 mg p.o. daily.  3. Warfarin 5 mg as directed.  4. Lipitor 10 mg p.o. daily.  5. Digoxin 0.25 mg daily.  6. Januvia 100 mg p.o. daily.  7. Metoprolol 100 mg p.o. b.i.d.  8. Glyburide/metformin 5/500 2 tabs p.o. b.i.d.  9. Gemfibrozil 600 mg p.o. b.i.d.  10.Niaspan 500 mg p.o. at bedtime.  11.Amlodipine 10 mg p.o. daily.  12.Furosemide 20 mg 3 times p.o. b.i.d.  13.Allopurinol 100 mg p.o. daily.  14.Potassium 20 mEq p.o. daily.  15.Lantus 50 units daily.   PHYSICAL EXAMINATION:  VITAL SIGNS:  Blood pressure 156/80, heart rate  80, and weight 330 pounds.  NECK:  No carotid upstroke.  JVD is markedly elevated.  No thyromegaly.  Normal carotid upstroke.  LUNGS:  Crackles bilaterally at both bases.  HEART:  Regular rate and rhythm.  Normal S1 and S2.  There is an  S3  present.  There is no pathological murmurs.  ABDOMEN:  Markedly distended with no rebound or guarding.  EXTREMITIES:  He has 3-4+ pitting edema.   PROBLEMS:  1. Volume overload in the setting of nonischemic cardiomyopathy by      catheterization.  2. Ejection fraction 35%.  3. Atrial flutter, status post successful cardioversion in November      2007.  4. Chronic Coumadin anticoagulation.  5. [place] score of 3.  6. Obstructive sleep apnea on CPAP.  7. Morbid obesity.  8. Type 2 diabetes mellitus.  9. Hypertension, uncontrolled.   PLAN:  1. The patient is clearly markedly volume overloaded.  He is not      responding to his Demadex.  We will use a combination of      hydrochlorothiazide and Lasix.  I have asked him to increase his      potassium to 20 mEq p.o. t.i.d.  and we will do a BMET next Monday.  2. The patient was also warned that if he starts feeling weak with      this regimen that he might be losing a significant amount of      potassium, he needs to see [place] earlier and also set up an RN visit      in 10 days for him and a followup visit with me in 1 month.  3. I told him that I think he should rapidly improve on this more      aggressive diuretic regimen.     Learta Codding, MD,FACC  Electronically Signed    GED/MedQ  DD: 09/04/2008  DT: 09/04/2008  Job #: [id number]

## 2011-01-01 ENCOUNTER — Other Ambulatory Visit: Payer: Self-pay | Admitting: Cardiology

## 2011-01-02 NOTE — Assessment & Plan Note (Signed)
[facility]                          [facility]   NAME:Oberholzer, STEPEN PRINS                      MRN:          [id number]  DATE:11/08/2006                            DOB:          [birth year]    PRIMARY CARDIOLOGIST:  Learta Codding, MD, [facility]   REASON FOR VISIT:  Post-catheterization followup. Please refer to Dr.  Margarita Mail complete clinic note of February 11 for full details.   The patient returns after undergoing elective cardiac catheterization on  February 15, by Dr. Randa Evens, in followup of persistent  cardiomyopathy with no previous history of cardiac catheterization. The  angiogram revealed normal coronary arteries, but with persistent LV  dysfunction (EF of 30%) with global hypokinesis. No significant valvular  abnormalities were noted. Dr. Samule Ohm concluded that this represented non-  ischemic cardiomyopathy and recommended continued medical therapy.   The patient now presents at followup. He continues to feel great after  undergoing successful D/C cardioversion in November 2007. He has resumed  Coumadin since his catheterization, with an INR level of 4.5 today. He  notes no complications of the right groin incision site.   CURRENT MEDICATIONS:  Extensive, as previously outlined.   PHYSICAL EXAMINATION:  Blood pressure is 150/86, pulse 89 and regular.  Weight is 315.6.  GENERAL: A 50 year old male, obese, sitting upright in no distress.  HEENT: Normocephalic, atraumatic.  NECK: Palpable bilateral carotid pulses without bruits.  LUNGS:  Clear to auscultation in all fields.  HEART: Regular rate and rhythm (S1, S2). Soft S4. No murmurs.  ABDOMEN: Protuberant, nontender.  EXTREMITIES: Right groin stable with no ecchymosis, hematoma, or bruit  on auscultation; intact femoral pulse. Minimally palpable right  posterior tibialis pulses with chronic bilateral lower extremity edema  and chronic venous stasis changes.   IMPRESSION:  1. Non-ischemic cardiomyopathy.      a.     Angiographically normal coronary arteries by recent       catheterization.      b.     Ejection fraction of 30% with global hypokinesis.  2. History of atrial flutter.      a.     Status post successful D/C cardioversion in November of       2007.  3. Chronic Coumadin anticoagulation.      a.     [place] score: 3.  4. Obstructive sleep apnea/on continuous positive airway pressure      (CPAP).  5. Morbid obesity.  6. Type 2 diabetes mellitus.  7. Hypertension.   PLAN:  1. Discontinue aspirin, given that the patient is on longterm Coumadin      and has no documented coronary artery disease.  2. Reassess left ventricular function in 3 months with a repeat 2D      echo. If persistently depressed, then we will need to consider      proceeding with implantable cardioverter defibrillator (ICD)      implantation.  3. Schedule return clinic followup with Dr. Andee Lineman in three months for      review of echo results and further recommendations.      Gene Serpe,  PA-C  Electronically Signed      Learta Codding, MD,FACC  Electronically Signed   GS/MedQ  DD: 11/08/2006  DT: 11/08/2006  Job #: [id number]   cc:   Kirstie Peri, MD

## 2011-01-02 NOTE — Cardiovascular Report (Signed)
NAMEJOESEPH, VERVILLE NO.:  [id number]   MEDICAL RECORD NO.:  [id number]          PATIENT TYPE:  OIB   LOCATION:  1961                         FACILITY:  [facility]   PHYSICIAN:  Salvadore Farber, MD  DATE OF BIRTH:  12/19/1960   DATE OF PROCEDURE:  10/01/2006  DATE OF DISCHARGE:                            CARDIAC CATHETERIZATION   PROCEDURE:  Left heart catheterization, left ventriculography, coronary  angiography.   INDICATIONS:  [name] is an obese 50 year old gentleman with  hypertension, hypercholesterolemia, and diabetes mellitus.  He recently  suffered atrial flutter for which he was cardioverted.  However, after  cardioversion, he remains dyspneic with exertion. Echo demonstrated an  ejection fraction of 30%. Adenosine Cardiolite demonstrated a dilated  left ventricle and ejection fraction of 32% and several areas of  decreased tracer uptake in both the inferior and anterior walls.  He was  therefore referred for diagnostic angiography to exclude an ischemic  etiology to his cardiomyopathy.   PROCEDURAL TECHNIQUE:  Informed consent was obtained.  Under 1%  lidocaine local anesthesia, a 4-French sheath was placed in the right  common femoral artery using modified Seldinger technique.  Diagnostic  angiography and ventriculography were performed using JL-5, 3-D RCA, and  pigtail catheters.  All catheter exchanges were performed over a wire.  The sheath was removed and manual compression applied.  Complete  hemostasis was obtained.  The patient was then transferred to holding  room in stable condition having tolerated the procedure well.   COMPLICATIONS:  None.   FINDINGS:  1. LV:  138/8/20.  EF 30% with global hypokinesis.  2. No aortic stenosis or mitral regurgitation.  3. Left main:  Angiographically normal.  4. LAD:  Moderate-sized vessel giving rise to a single diagonal.  It      is angiographically normal.  5. Circumflex:  Large, dominant vessel  giving rise to two obtuse      marginals, a posterior left ventricular branch and a PDA.  It is      angiographically normal.  6. RCA:  Moderate-sized nondominant vessel.  It is angiographically      normal.   IMPRESSION/RECOMMENDATIONS:  1. Angiographically normal coronary arteries.  2. EF 30% with global hypokinesis.  3. Moderately elevated left ventricular end-diastolic pressure.  4. No mitral regurgitation.   The patient thus has a nonischemic cardiomyopathy.  Will continue  current medical therapy.      Salvadore Farber, MD  Electronically Signed     WED/MEDQ  D:  10/01/2006  T:  10/01/2006  Job:  [id number]   cc:   Learta Codding, MD,FACC  Kirstie Peri, MD

## 2011-01-02 NOTE — Assessment & Plan Note (Signed)
[facility]                          [facility]   NAME:[name], [name]                        MRN:          [id number]  DATE:08/27/2006                            DOB:          [birth year]    HISTORY OF PRESENT ILLNESS:  The patient is a 50 year old male with a  presumed nonischemic cardiomyopathy, previously followed by Dr.  Dietrich Pates. The patient was seen several months ago by Dr. Myrtis Ser and found  to be in atrial flutter. The patient has been started on Coumadin and in  the interim has undergone a T cardioversion. The patient has maintained  normal sinus rhythm. He states that he has dramatically improved. He is  now able to walk a mile and a half everyday without any dyspnea. He  denies any orthopnea, or PND. He also had an Apnea Linkwhich was  suggestive of obstructive sleep apnea. The patient tells me that he  never had  a prior cardiac catheterization or a Cardiolite study . In  essence we really do not known if the patient has an  ischemic or  nonischemic cardiomyopathy. Of note that the patient does have  underlying diabetes mellitus. He had a follow up echocardiographic study  done after his cardioversion. The EF remained around 25%.   MEDICATIONS:  1. Lantus 40 units b.i.d.  2. Novolog 30 units b.i.d.  3. Byetta 10 mg p.o. b.i.d.  4. Furosemide 120 b.i.d.  5. Glyburide/Metformin  5/500 two b.i.d.  6. Warfarin 5 mg as directed.  7. Allopurinol 100 mg p.o. q. Day.  8. Diovan 160 mg p.o. q. Day.  9. Lipitor 10 mg p.o. q. Day.  10.Digoxin 0.25 q. Day.  11.Gemfibrozil 600 mg p.o. q. Day.  12.Metoprolol 20 mg p.o. q. Day.  13.Amlodipine 5 mg q. Day.  14.Aspirin 81 mg p.o. q. Day.  15.Multivitamin.   PHYSICAL EXAMINATION:  VITAL SIGNS: Blood pressure is 130/72, heart rate  is 85 beats per minute.  GENERAL: Overweight white male with no apparent distress.  HEENT: Pupils are equal.  NECK: Supple, normal carotid upstrokes. No  carotid bruits.  LUNGS: Clear breath sounds bilaterally.  HEART: Regular rate and rhythm, normal S1, S2, no murmurs.  ABDOMEN: Soft.  EXTREMITY EXAM: No cyanosis, clubbing, or edema.   PROBLEM LIST:  1. Dilated cardiomyopathy of unclear etiology, ischemic versus      nonischemic.      a.     Ejection fraction at 25% post cardiac catheterization.      b.     Segmental wall motion abnormalities noted during his last       echocardiographic study.  2. Atrial flutter status post cardioversion.  3. Coumadin anticoagulation.  4. Possible sleep apnea with positive apnea on monitor.  5. History of clinical congestive heart failure, improved.  6. Diabetes mellitus.   PLAN:  1. The patient has markedly improved and has remained in normal sinus      rhythm. He can continue on his current medical regimen.  2. INR is controlled and is at 2.5 today and Coumadin can be  continued.  3. Patient will be set up for formal sleep study as his apnea      ApneaLink  monitor is suggestive of obstructive sleep apnea. This      may be contributing to his cardiomyopathy and/or his hypertension.  4. The patient was formerly seen by Dr. Dietrich Pates.  I have reviewed the      records from [place] and there is no evidence that a Cardiolite      study or ischemia workup was done. The last echocardiographic study      suggested wall motion abnormalities and is concerning for ischemic      heart disease. I have taken the liberty to schedule the patient for      a adenosine Cardiolite study with low level exercise. If abnormal      we would likely proceed with cardiac catheterization to establish      etiology of the patient's cardiomyopathy.  5. Followup echocardiographic study will be done in 3 months to see if      there is any improvement in his ejection fraction post atrial      flutter cardioversion.  6. Aspirin can be discontinued in the meanwhile while patient is on      Coumadin.  7. Patient will  followup with [place] in 3 months.     Learta Codding, MD,FACC  Electronically Signed    GED/MedQ  DD: 08/27/2006  DT: 08/27/2006  Job #: [id number]

## 2011-01-02 NOTE — Assessment & Plan Note (Signed)
[facility]                            [facility]   NAME:Orser, Wyvonne Lenz                       MRN:          [id number]  DATE:03/12/2006                            DOB:          [birth year]    Mr. Molinari has a history of a cardiomyopathy.  He has seen Dr. Dietrich Pates in  the past in the [place] office.  I have one letter from there.  I do not  have all of the rest of the records.  Mr. Diguglielmo lives in [place] and he would  like to have his care here and he is seeing Dr. Sherryll Burger for primary care.   The patient has increased shortness of breath over the past 6-8 weeks.  He  had an echo done on February 08, 2006 showing that his ejection fraction is in  the 20% range.  There was question that his rhythm at that time was atrial  flutter.  The patient gives a history of PND and orthopnea and exertional  shortness of breath.   PAST MEDICAL HISTORY:   ALLERGIES:  No known drug allergies.   MEDICATIONS:  1.  Allopurinol.  2.  Glyburide.  3.  Metformin.  4.  Gemfibrozil.  5.  Lipitor.  6.  Spironolactone.  7.  Diovan.  8.  Toprol-XL 200 mg.  9.  Amlodipine.  10. Lasix 80 b.i.d.  11. Potassium 20.  12. Bieta for diabetes.  13. Lantus.  14. NovoLog.   OTHER MEDICAL PROBLEMS:  See the complete list below.   SOCIAL HISTORY:  The patient does not smoke.  He is disabled.   FAMILY HISTORY:  Father died at age 52, possibly of asthma.   REVIEW OF SYSTEMS:  Today, he is not having any major GI or GU symptoms.  His main symptoms are related to his shortness of breath.   Otherwise, review of systems is negative.   PHYSICAL EXAM:  Blood pressure today is 118/72.  His pulse is 115.  Weight  is 317 pounds.  The patient is oriented to person, time and place and his  affect is normal.  LUNGS:  Reveal question of rales.  HEENT:  Reveals no xanthelasma.  He has normal extraocular motion.  There is  no jugular venous distension.  There are no carotid  bruits.  CARDIAC:  Exam reveals an S1, S2, with tachycardia.  ABDOMEN:  Obese but soft.  He does have 1-2+ peripheral edema.   EKG reveals that there is a rate of 115 and this may well be atrial flutter.   PROBLEMS:  1.  History of nonischemic cardiomyopathy.  Recent echo showed 20%.  We will      obtain more of the records from the [place] office.  2.  Obesity.  3.  Hypertension.  4.  Diabetes.  5.  Increased heart rate, most probably atrial flutter.  I want to add      Lanoxin because I am hesitant to add Cardizem with his severe left      ventricular dysfunction.  However, I need to document his recent  creatinine and potassium before this can be started.  We are waiting for      some of these labs and if we cannot obtain them, will check them today.      His Lasix dose will be increased from 80 b.i.d. to 120 mg b.i.d.  The      patient does drink an excessive amount of fluid and I have asked him to      cut this down despite his thirst.  I have encouraged him to get sugar-      free candy for his thirst.  Because of the change in the medicines and      the tachycardia and his marked shortness of breath, we will see him back      in the office on Monday or Tuesday of next week for very early follow up      to continue to manage his problems.                                   Luis Abed, MD, [facility]   JDK/MedQ  DD:  03/12/2006  DT:  03/12/2006  Job #:  [id number]   cc:   Dr. Sherryll Burger

## 2011-01-02 NOTE — Assessment & Plan Note (Signed)
[facility]                          [facility] NOTE   NAME:[name], [name]                        MRN:          [id number]  DATE:09/27/2006                            DOB:          11/14/1960    PRIMARY CARE PHYSICIAN:  Kirstie Peri, M.D.   HISTORY OF PRESENT ILLNESS:  The patient is a 50 year old male with a  history of previously presumed nonischemic cardiomyopathy.  He was  previously followed by Dr. Dietrich Pates and more recently by Dr. Myrtis Ser.  During the last visit with Dr. Myrtis Ser the patient was found to be in  atrial flutter.  The patient was started on Coumadin and in the interim  has already undergone D cardioversion which I performed in November  2007.  The patient has maintained normal sinus rhythm.  Prior to his  cardioversion the patient was rather dyspneic on minimal exertion but  felt that he had significant improvement after he maintained normal  sinus rhythm.  I saw him back in followup on August 27, 2006.  At that  time he stated he was able to walk a mile-and-a-half every day without  any chest pain or dyspnea.  We were also concerned at that time the  patient had sleep apnea and an apnea link was applied.  This was very  suggestive of sleep apnea.  Subsequently, the patient underwent sleep  study which was markedly positive.  He has been scheduled for titration  of a CPAP mask later this month.   However, I was concerned about this patient having an ischemic  cardiomyopathy given his multiple cardiac risk factors.  Also, more  recently, he now has developed some increased dyspnea on exertion  without any frank chest pain.  Because of the fact that the patient  never had an ischemia evaluation, and also because a prior  echocardiographic study demonstrated segmental abnormalities, we  performed a nuclear imaging study.  The study was read by Dr. Myrtis Ser and  this was an adenosine infusion study.  Dr. Myrtis Ser reported that the  nuclear images revealed a dilated ventricle with an ejection fraction of  32%.  He felt that there were several areas of decreased tracer uptake  both in inferior wall and anterior wall that could be suggestive of  ischemia.   The patient now returns to the office for followup and to discuss these  results with anticipation of proceeding with a cardiac catheterization.   MEDICATIONS:  1. Lantus insulin 40 units b.i.d.  2. NovoLog 30 units b.i.d.  3. Byetta 10 mg p.o. b.i.d.  4. Furosemide 120 mg p.o. b.i.d.  5. Glyburide/metformin 5/500 two tablets b.i.d.  6. Warfarin as directed.  7. Allopurinol 100 mg p.o. daily.  8. Diovan 160 mg p.o. daily.  9. Lipitor 10 mg p.o. daily.  10.Digoxin 0.25 mg p.o. daily.  11.Gemfibrozil 600 mg p.o. daily.  12.Metoprolol XL 200 mg p.o. daily.  13.Amlodipine 5 mg p.o. daily.  14.Aspirin 81 mg a day.  15.Multivitamin.   PHYSICAL EXAMINATION:  VITAL SIGNS:  Blood pressure 160/94, heart rate  is 96 beats per minute  GENERAL:  Overweight white male but in no apparent distress.  HEENT:  Pupils isocoric, conjunctivae clear.  NECK:  Supple, normal carotid upstroke, no definite carotid bruits.  JVD  is approximately 7 cm.  LUNGS:  Clear breath sounds bilaterally.  HEART:  Regular rate and rhythm.  Distant heart sounds with normal S1,  S2.  No pathological murmurs.  ABDOMEN:  Soft and nontender with no rebound or guarding but quite  protuberant.  EXTREMITIES:  Reveals no cyanosis, clubbing or edema.  Dorsalis pedis  and posterior tibial pulses are palpable.   PROBLEM LIST:  1. Dilated cardiomyopathy of unclear etiology.      a.     Rule out ischemic versus nonischemic cardiomyopathy.      b.     Ejection fraction 25% post cardioversion.      c.     Abnormal Cardiolite imaging study with an ejection fraction       of 32% and decreased uptake in anterior and inferior wall       suggestive of ischemia.      d.     Segmental wall motion abnormalities  noted during       echocardiography.  2. History of atrial flutter status post cardioversion November 2007.  3. Coumadin anticoagulation.  4. Sleep apnea.      a.     Positive apnea link.      b.     Positive formal sleep study, scheduled for titration of       CPAP.  5. History of clinical congestive heart failure; see details above.  6. Diabetes mellitus.  7. Hypertension.   PLAN:  1. I recommended to the patient today to proceed with cardiac      catheterization given his abnormal echocardiographic study with      wall motion abnormalities as well as Cardiolite study with multiple      areas suggestive of ischemia.  Previously, the thought was this      patient maybe could have had a tachycardia-induced cardiomyopathy      or heart failure associated with sleep apnea.  I think this is less      likely in that I am concerned that the patient has significant      coronary artery disease.  2. INR will be checked today in the office and I anticipate the      patient can stop his Coumadin and cardiac catheterization can be      scheduled for this Friday.  3. A 12-lead electrocardiogram will also be obtained in the office      today as the patient is somewhat tachycardic, but I do not think he      is back in atrial flutter.  4. Metformin will be held for the patient's cardiac catheterization.      Aspirin was discontinued in the interim while the patient was on      Coumadin.  5. The patient will see me back in 2 weeks and in the interim will      proceed with a left and right heart catheterization.     Learta Codding, MD,FACC  Electronically Signed    GED/MedQ  DD: 09/27/2006  DT: 09/27/2006  Job #: [id number]   cc:   Kirstie Peri, MD

## 2011-01-07 ENCOUNTER — Other Ambulatory Visit: Payer: Self-pay | Admitting: Cardiology

## 2011-02-16 ENCOUNTER — Other Ambulatory Visit: Payer: Self-pay | Admitting: Cardiology

## 2011-05-20 ENCOUNTER — Other Ambulatory Visit: Payer: Self-pay | Admitting: Cardiology

## 2011-05-28 ENCOUNTER — Encounter: Payer: Self-pay | Admitting: Cardiology

## 2011-05-28 ENCOUNTER — Ambulatory Visit (INDEPENDENT_AMBULATORY_CARE_PROVIDER_SITE_OTHER): Payer: Medicare Other | Admitting: Cardiology

## 2011-05-28 VITALS — BP 126/80 | HR 77 | Ht 72.0 in | Wt 247.0 lb

## 2011-05-28 DIAGNOSIS — I4892 Unspecified atrial flutter: Secondary | ICD-10-CM

## 2011-05-28 DIAGNOSIS — I429 Cardiomyopathy, unspecified: Secondary | ICD-10-CM

## 2011-05-28 DIAGNOSIS — I10 Essential (primary) hypertension: Secondary | ICD-10-CM

## 2011-05-28 DIAGNOSIS — R7989 Other specified abnormal findings of blood chemistry: Secondary | ICD-10-CM

## 2011-05-28 DIAGNOSIS — R0602 Shortness of breath: Secondary | ICD-10-CM

## 2011-05-28 DIAGNOSIS — I5022 Chronic systolic (congestive) heart failure: Secondary | ICD-10-CM

## 2011-05-28 DIAGNOSIS — I4891 Unspecified atrial fibrillation: Secondary | ICD-10-CM

## 2011-05-28 NOTE — Assessment & Plan Note (Signed)
I suspect the patient's ejection fraction may have significantly improved in the setting of a dramatic lifestyle changes with weight loss and blood pressure control. We will obtain a one-time cardiac MRI with contrast enhancement to evaluate ejection fraction. In the past I have specifically restrain myself for recommending an EP evaluation for possible ICD implantation. Given the fact that he is a nonischemic cardiomyopathy with stable ejection fraction as matter fact with slight improvement in ejection fraction I felt the best option was to have the patient continue with therapeutic lifestyle changes and then make a decision based on cardiac MRI which will be performed the next couple weeks.

## 2011-05-28 NOTE — Assessment & Plan Note (Signed)
Patient remains in normal sinus rhythm. 

## 2011-05-28 NOTE — Assessment & Plan Note (Signed)
Blood pressure well controlled. May need to decrease blood pressure medications in the future.

## 2011-05-28 NOTE — Patient Instructions (Signed)
   Cardiac MRI If the results of your test are normal or stable, you will receive a letter.  If they are abnormal, the nurse will contact you by phone. Your physician wants you to follow up in:  1 year.  You will receive a reminder letter in the mail one-two months in advance.  If you don't receive a letter, please call our office to schedule the follow up appointment

## 2011-05-28 NOTE — Progress Notes (Signed)
History of present illness:  The patient is a 50 year old male with a prior history of nonischemic cardiomyopathy, status post cardiac catheterization in 2008. His ejection fraction has been 30-35%. He has not required any admissions for heart failure. He has a history of atrial flutter but has remained in normal sinus rhythm. He is also on [facility] therapy with dabigatran. He reports no bleeding complications. The patient also has obstructive sleep apnea and is on CPAP.  The patient is doing remarkably well. His lower extremity edema has for the most part resolved. In the last office visit he lost 30 pounds and now he has lost another 60 pounds. He states that he is not snacking anymore and limits himself to 3 meals a day and has started exercising. The patient has made a remarkable turnaround in his lifestyle and management of his weight.  He denies any orthopnea PND palpitations or syncope. He stable from a cardiovascular perspective in his electrocardiac today also showed maintenance of normal sinus rhythm.   Allergies, family history and social history: As documented in chart and reviewed  Medications: Documented and reviewed in chart.  Past medical history: Reviewed and see problem list below   Review of systems: No nausea and vomiting. No melena hematochezia. No dysuria frequency. No palpitations or syncope. No visual changes.   Physical examination : Vital signs documented below General: Well-nourished white male with significant voluntary weight loss in no distress HEENT: Normal carotid upstroke no carotid bruits. No thyromegaly nonnodular thyroid. EOMI, PERRLA Lungs: Clear breath sounds bilaterally, no wheezing Heart: Regular rate and rhythm with normal S1-S2 no murmur rubs or gallops Abdomen: Soft nontender no rebound or guarding and good bowel sounds Extremity exam: No cyanosis clubbing or edema Neurologic: Alert and oriented x3, grossly nonfocal Psychiatric: Normal  affect Vascular exam: Normal peripheral pulses bilaterally Skin: oker dermatitis both lower extremities secondary to chronic venous insufficiency

## 2011-06-03 ENCOUNTER — Other Ambulatory Visit: Payer: Self-pay | Admitting: Cardiology

## 2011-06-03 DIAGNOSIS — R0602 Shortness of breath: Secondary | ICD-10-CM

## 2011-06-09 ENCOUNTER — Telehealth: Payer: Self-pay | Admitting: Cardiology

## 2011-06-19 ENCOUNTER — Other Ambulatory Visit: Payer: Self-pay | Admitting: *Deleted

## 2011-06-19 MED ORDER — FUROSEMIDE 80 MG PO TABS: 80.0000 mg | ORAL_TABLET | Freq: Two times a day (BID) | ORAL | Status: DC

## 2011-06-19 NOTE — Progress Notes (Signed)
Addended by: Murriel Hopper on: 06/19/2011 11:35 AM   Modules accepted: Orders

## 2011-06-23 NOTE — Telephone Encounter (Signed)
MRI was cancelled at this time.

## 2011-06-30 ENCOUNTER — Other Ambulatory Visit: Payer: Self-pay | Admitting: *Deleted

## 2011-06-30 DIAGNOSIS — R7989 Other specified abnormal findings of blood chemistry: Secondary | ICD-10-CM

## 2011-06-30 DIAGNOSIS — Z79899 Other long term (current) drug therapy: Secondary | ICD-10-CM

## 2011-06-30 DIAGNOSIS — E875 Hyperkalemia: Secondary | ICD-10-CM

## 2011-06-30 DIAGNOSIS — I4891 Unspecified atrial fibrillation: Secondary | ICD-10-CM

## 2011-06-30 MED ORDER — DIGOXIN 250 MCG PO TABS: ORAL_TABLET | ORAL | Status: DC

## 2011-07-08 ENCOUNTER — Other Ambulatory Visit: Payer: Self-pay | Admitting: *Deleted

## 2011-07-10 ENCOUNTER — Encounter: Payer: Self-pay | Admitting: Cardiology

## 2011-07-10 ENCOUNTER — Other Ambulatory Visit: Payer: Self-pay | Admitting: Cardiology

## 2011-07-15 ENCOUNTER — Ambulatory Visit (HOSPITAL_COMMUNITY)
Admission: RE | Admit: 2011-07-15 | Discharge: 2011-07-15 | Disposition: A | Discharge status: Home / Self Care | Payer: Medicare Other | Source: Ambulatory Visit | Attending: Cardiology | Admitting: Cardiology

## 2011-07-15 DIAGNOSIS — I428 Other cardiomyopathies: Secondary | ICD-10-CM

## 2011-07-15 MED ORDER — GADOBENATE DIMEGLUMINE 529 MG/ML IV SOLN
40.0000 mL | Freq: Once | INTRAVENOUS | Status: AC
  Administered 2011-07-15: 40 mL via INTRAVENOUS

## 2011-07-20 ENCOUNTER — Other Ambulatory Visit: Payer: Self-pay | Admitting: Cardiology

## 2011-07-27 ENCOUNTER — Institutional Professional Consult (permissible substitution): Payer: Medicare Other | Admitting: Internal Medicine

## 2011-07-29 ENCOUNTER — Ambulatory Visit (INDEPENDENT_AMBULATORY_CARE_PROVIDER_SITE_OTHER): Payer: Medicare Other | Admitting: Internal Medicine

## 2011-07-29 ENCOUNTER — Encounter: Payer: Self-pay | Admitting: Internal Medicine

## 2011-07-29 DIAGNOSIS — I519 Heart disease, unspecified: Secondary | ICD-10-CM

## 2011-07-29 DIAGNOSIS — I10 Essential (primary) hypertension: Secondary | ICD-10-CM

## 2011-07-29 DIAGNOSIS — I429 Cardiomyopathy, unspecified: Secondary | ICD-10-CM

## 2011-07-29 NOTE — Assessment & Plan Note (Signed)
Stable No change required today  

## 2011-07-29 NOTE — Assessment & Plan Note (Signed)
As above.

## 2011-07-29 NOTE — Progress Notes (Signed)
Primary Care Physician: Kirstie Peri, MD Referring Physician:  Dr Lucia Gaskins [name] is a 50 y.o. male with a h/o chronic systolic dysfunction and a nonischemic CM (EF 31%) who presents today for EP consultation.  He reports having a nonischemic CM for about 20 years.  He was initially managed by Dr Dietrich Pates.  He underwent cath by Dr Samule Ohm 10/04/2006 which revealed EF 30% with angiographically normal coronary arteries.  He has been managed with optimal therapy but continues to have a depressed ejection fraction.  Recent MRI reveals EF 315. He reports that he can walk 1-2 miles at a slow pace but becomes SOB with moderate activity.  He denies symptoms of palpitations, chest pain, orthopnea, PND, lower extremity edema, dizziness, presyncope, syncope, or neurologic sequela. The patient is tolerating medications without difficulties and is otherwise without complaint today.   Past Medical History  Diagnosis Date  . Hypertension   . Morbid obesity   . Atrial flutter  10/01/2006     CARDIAC CATHETERIZATION  . Type II or unspecified type diabetes mellitus without mention of complication, not stated as uncontrolled   . OSA (obstructive sleep apnea)     compliant with CPAP  . Nonischemic cardiomyopathy     ejection fraction 30-35%,   . Chronic systolic dysfunction of left ventricle   . Unspecified disorder of kidney and ureter    Past Surgical History  Procedure Date  . None     Current Outpatient Prescriptions  Medication Sig Dispense Refill  . allopurinol (ZYLOPRIM) 100 MG tablet Take 100 mg by mouth daily.        [name] amLODipine (NORVASC) 10 MG tablet Take 10 mg by mouth daily.        [name] atorvastatin (LIPITOR) 10 MG tablet Take 10 mg by mouth daily.        . cloNIDine (CATAPRES) 0.2 MG tablet Take 0.2 mg by mouth 2 (two) times daily.        . dabigatran (PRADAXA) 150 MG CAPS Take 1 capsule (150 mg total) by mouth every 12 (twelve) hours.  60 capsule  6  . digoxin (LANOXIN) 0.25 MG tablet  Take 1/2 tab (0.125mg ) daily      . furosemide (LASIX) 80 MG tablet Take 1 tablet (80 mg total) by mouth 2 (two) times daily.      [name] gemfibrozil (LOPID) 600 MG tablet Take 600 mg by mouth 2 (two) times daily before a meal.        . glyBURIDE-metformin (GLUCOVANCE) 5-500 MG per tablet Take 2 tablets by mouth 2 (two) times daily with a meal.        . hydrochlorothiazide (,MICROZIDE/HYDRODIURIL,) 12.5 MG capsule TAKE (1) CAPSULE TWICE DAILY.  60 capsule  6  . insulin aspart (NOVOLOG) 100 UNIT/ML injection Inject 10 Units into the skin 2 (two) times daily before a meal.       . insulin detemir (LEVEMIR) 100 UNIT/ML injection Inject 70 Units into the skin 2 (two) times daily.        . metoprolol (LOPRESSOR) 100 MG tablet Take 100 mg by mouth 2 (two) times daily.        [name] NIASPAN 500 MG CR tablet TAKE ONE TABLET AT BEDTIME  30 each  6  . prazosin (MINIPRESS) 1 MG capsule TAKE (1) CAPSULE TWICE DAILY.  60 capsule  6  . valsartan (DIOVAN) 320 MG tablet Take 320 mg by mouth daily.          No Known  Allergies  History   Social History  . Marital Status: Married    Spouse Name: N/A    Number of Children: N/A  . Years of Education: N/A   Occupational History  . DISABLED    Social History Main Topics  . Smoking status: Never Smoker   . Smokeless tobacco: Never Used  . Alcohol Use: No  . Drug Use: No  . Sexually Active: Not on file   Other Topics Concern  . Not on file   Social History Narrative   Pt lives in [place] alone.  Disabled.  Previously worked in Designer, fashion/clothing and drove a truck.    Family History  Problem Relation Age of Onset  . Asthma Father 61    DECEASED  . Diabetes      ROS- All systems are reviewed and negative except as per the HPI above  Physical Exam: Filed Vitals:   07/29/11 1420  BP: 140/64  Pulse: 81  Height: 5\' 11"  (1.803 m)  Weight: 264 lb (119.75 kg)    GEN- The patient is well appearing, alert and oriented x 3 today.   Head- normocephalic,  atraumatic Eyes-  Sclera clear, conjunctiva pink Ears- hearing intact Oropharynx- clear Neck- supple, no JVP Lymph- no cervical lymphadenopathy Lungs- Clear to ausculation bilaterally, normal work of breathing Heart- Regular rate and rhythm, no murmurs, rubs or gallops, PMI not laterally displaced GI- soft, NT, ND, + BS Extremities- no clubbing, cyanosis, or edema MS- no significant deformity or atrophy Skin- no rash or lesion Psych- euthymic mood, full affect Neuro- strength and sensation are intact  EKG today reveals sinus rhythm 81 bpm, LA, incomplete LBBB (QRS ) Echo and MRI reviewed Assessment and Plan:

## 2011-07-29 NOTE — Assessment & Plan Note (Signed)
The patient has a nonischemic CM (EF 31%) and NYHA Class II/III CHF.[name] At this time, he meets SCD-HeFT criteria for ICD implantation for primary prevention of sudden death.  Though he has an incomplete LBBB, his QRS is only .  He is therefore not a candidate for CRT.  Risks, benefits, alternatives to ICD implantation were discussed in detail with the patient today. The patient  understands that the risks include but are not limited to bleeding, infection, pneumothorax, perforation, tamponade, vascular damage, renal failure, MI, stroke, death, inappropriate shocks, and lead dislodgement and wishes to further contemplate this option.  He states that he is "leaning towards not having an ICD implanted". I have provided him with an educational handout about ICDs.  He will further contemplate this option and contact me if he wishes to proceed. He will resume regular cardiology care with Dr Andee Lineman.

## 2011-07-29 NOTE — Patient Instructions (Signed)
Your physician recommends that you schedule a follow-up appointment as needed  

## 2011-08-06 ENCOUNTER — Other Ambulatory Visit: Payer: Self-pay | Admitting: Cardiology

## 2011-08-12 ENCOUNTER — Other Ambulatory Visit: Payer: Self-pay | Admitting: Cardiology

## 2011-09-28 ENCOUNTER — Other Ambulatory Visit: Payer: Self-pay | Admitting: Cardiology

## 2011-09-29 ENCOUNTER — Other Ambulatory Visit: Payer: Self-pay | Admitting: Cardiology

## 2011-12-25 ENCOUNTER — Telehealth: Payer: Self-pay | Admitting: *Deleted

## 2011-12-25 DIAGNOSIS — R0602 Shortness of breath: Secondary | ICD-10-CM

## 2011-12-25 DIAGNOSIS — R609 Edema, unspecified: Secondary | ICD-10-CM

## 2011-12-25 NOTE — Telephone Encounter (Signed)
Patient given appointment to see Gene next Wednesday on 12/30/11 @1 :40pm. Patient informed. Nurse informed patient that if he get's increased sob or swelling becomes intolerable, he should go to ED for evaluation.

## 2011-12-25 NOTE — Telephone Encounter (Signed)
Patient left message on nurse's voicemail this morning at 11:17 am requesting an appointment or to fluid medications adjusted because of some increase swelling in ankles and feet. Patient denied having chest pain, dizziness but said he was having small amount of sob.

## 2011-12-27 NOTE — Telephone Encounter (Signed)
Patient should come on Monday to Short Stay and get 80mg  IV lasix and PO HCTZ 50 mg 30 min befor IV lasix. The have labs in the AM stat the day he sees Gene, BMET and BNP.

## 2011-12-28 NOTE — Telephone Encounter (Signed)
Patient notified and verbalized understanding.   Placed call to day hospital & patient may come on over now.  Will notify pt and send order.

## 2011-12-28 NOTE — Telephone Encounter (Signed)
Addended by: Murriel Hopper on: 12/28/2011 12:29 PM   Modules accepted: Orders

## 2011-12-28 NOTE — Telephone Encounter (Signed)
Please handle this for me.

## 2011-12-28 NOTE — Telephone Encounter (Signed)
Left message to return call 

## 2011-12-29 NOTE — Telephone Encounter (Signed)
Addended by: Murriel Hopper on: 12/29/2011 02:18 PM   Modules accepted: Orders

## 2011-12-29 NOTE — Telephone Encounter (Signed)
Orders for BMET & BNP sent to lab.  To be done prior to OV tomorrow evening with Gene.

## 2011-12-30 ENCOUNTER — Ambulatory Visit (INDEPENDENT_AMBULATORY_CARE_PROVIDER_SITE_OTHER): Payer: Medicare Other | Admitting: Physician Assistant

## 2011-12-30 ENCOUNTER — Encounter: Payer: Self-pay | Admitting: Physician Assistant

## 2011-12-30 ENCOUNTER — Encounter: Payer: Self-pay | Admitting: Cardiology

## 2011-12-30 VITALS — BP 119/66 | HR 100 | Temp 98.5°F | Ht 72.0 in | Wt 284.0 lb

## 2011-12-30 DIAGNOSIS — R600 Localized edema: Secondary | ICD-10-CM

## 2011-12-30 DIAGNOSIS — I10 Essential (primary) hypertension: Secondary | ICD-10-CM

## 2011-12-30 DIAGNOSIS — I4891 Unspecified atrial fibrillation: Secondary | ICD-10-CM

## 2011-12-30 DIAGNOSIS — R609 Edema, unspecified: Secondary | ICD-10-CM

## 2011-12-30 DIAGNOSIS — I4892 Unspecified atrial flutter: Secondary | ICD-10-CM

## 2011-12-30 DIAGNOSIS — I5023 Acute on chronic systolic (congestive) heart failure: Secondary | ICD-10-CM

## 2011-12-30 NOTE — Assessment & Plan Note (Signed)
Well-controlled. Continue current hypertensive regimen.

## 2011-12-30 NOTE — Assessment & Plan Note (Addendum)
Patient appears to have recurrent atrial flutter, with atypical pattern, at approximately 100 bpm. Plan is to hospitalize today and proceed with DCCV tomorrow, with Dr. Andee Lineman. Patient has been treated with long term Pradaxa. Will check TSH level.

## 2011-12-30 NOTE — Assessment & Plan Note (Addendum)
Patient will be admitted directly to [facility] for aggressive IV Lasix diuresis with 80 mg twice a day. A 2-D echo will be ordered for reassessment of LVF. Patient will need close monitoring of volume status with strict I/Os and daily weights. Followup labs in a.m. for monitoring of electrolytes and renal function. Will check a BNP level, as well. Patient presents with 20 pound weight gain, and I suspect that this is secondary to development of recurrent atrial dysrhythmia (probable atypical atrial flutter), with underlying known cardiomyopathy (EF approximately 30%).  Following discussion with Dr. Andee Lineman, additional recommendation as follows: Addition of Aldactone 25 mg daily, hold Norvasc, and insure Pradaxa dose taken this evening and in a.m. Will also add Unasyn 3 g IV every 6 hours for treatment of cellulitis.

## 2011-12-30 NOTE — Patient Instructions (Signed)
   Admit to [facility] today.

## 2011-12-30 NOTE — Progress Notes (Addendum)
HPI: Patient seen as an add-on for evaluation of dyspnea and progressive LE edema.  He states that he was in his USOH until approximately 2 weeks ago, when he developed worsening bilateral LE edema, particularly on the left. He has also experienced worsening DOE, but denies PND orthopnea. He has always slept in a recliner. He denies chest pain. He also reports palpitations in recent past, referred to as "fluttering".  Patient reports history of atrial fibrillation, status post successful DCCV approximately 5 years ago, here at [facility]. He was in NSR when last seen here in clinic, October '12, by Dr. Andee Lineman. He was subsequently seen by Dr. Johney Frame in December for EP evaluation. He was noted to have had NICM for approximately 20 years, with angiographically normal coronary arteries; EF 30%, by catheterization, in 2008. A recent MRI yielded EF 31%. Although the patient met criteria for ICD implantation for primary prevention of SCD, he was not a candidate for CRT, given that QRS was only 0.12 seconds. Dr. Johney Frame also noted that the patient was disinclined to proceed with an ICD, at that point in time.  Patient was treated with 80 mg IV Lasix/50 mg PO HCTZ yesterday in Short Stay, per Dr. Margarita Mail recommendation. Followup labs this a.m.: Potassium 3.9, BUN 27, creatinine 0.9. BNP 250.  Patient has gained 20 pounds since last OV.  No Known Allergies  Current Outpatient Prescriptions  Medication Sig Dispense Refill  . allopurinol (ZYLOPRIM) 100 MG tablet Take 100 mg by mouth daily.        [name] amLODipine (NORVASC) 10 MG tablet Take 10 mg by mouth daily.        [name] atorvastatin (LIPITOR) 10 MG tablet Take 10 mg by mouth daily.        . cloNIDine (CATAPRES) 0.2 MG tablet Take 0.2 mg by mouth 2 (two) times daily.        . digoxin (LANOXIN) 0.25 MG tablet Take one-half by mouth daily   15 tablet  6  . furosemide (LASIX) 80 MG tablet Take 1 tablet (80 mg total) by mouth 2 (two) times daily.  60 tablet  6  .  gemfibrozil (LOPID) 600 MG tablet Take 600 mg by mouth 2 (two) times daily before a meal.        . glyBURIDE-metformin (GLUCOVANCE) 5-500 MG per tablet Take 2 tablets by mouth 2 (two) times daily with a meal.        . hydrochlorothiazide (,MICROZIDE/HYDRODIURIL,) 12.5 MG capsule TAKE (1) CAPSULE TWICE DAILY.  60 capsule  6  . insulin aspart (NOVOLOG) 100 UNIT/ML injection Inject 10 Units into the skin 2 (two) times daily before a meal.       . insulin detemir (LEVEMIR) 100 UNIT/ML injection Inject 70 Units into the skin 2 (two) times daily.        . metoprolol (LOPRESSOR) 100 MG tablet Take 100 mg by mouth 2 (two) times daily.        [name] NIASPAN 500 MG CR tablet TAKE ONE TABLET AT BEDTIME  30 each  6  . PRADAXA 150 MG CAPS TAKE 1 CAPSULE EVERY 12 HOURS  60 capsule  6  . prazosin (MINIPRESS) 1 MG capsule TAKE (1) CAPSULE TWICE DAILY.  60 capsule  6  . valsartan (DIOVAN) 320 MG tablet Take 320 mg by mouth daily.          Past Medical History  Diagnosis Date  . Hypertension   . Morbid obesity   . Atrial flutter  10/01/2006  CARDIAC CATHETERIZATION  . Type II or unspecified type diabetes mellitus without mention of complication, not stated as uncontrolled   . OSA (obstructive sleep apnea)     compliant with CPAP  . Nonischemic cardiomyopathy     ejection fraction 30-35%,   . Chronic systolic dysfunction of left ventricle   . Unspecified disorder of kidney and ureter     Past Surgical History  Procedure Date  . None     History   Social History  . Marital Status: Married    Spouse Name: N/A    Number of Children: N/A  . Years of Education: N/A   Occupational History  . DISABLED    Social History Main Topics  . Smoking status: Never Smoker   . Smokeless tobacco: Never Used  . Alcohol Use: No  . Drug Use: No  . Sexually Active: Not on file   Other Topics Concern  . Not on file   Social History Narrative   Pt lives in [place] alone.  Disabled.  Previously worked in  Designer, fashion/clothing and drove a truck.    Family History  Problem Relation Age of Onset  . Asthma Father 8    DECEASED  . Diabetes      ROS: no nausea, vomiting; no fever, chills; no melena, hematochezia; no claudication  PHYSICAL EXAM: BP 119/66  Pulse 100  Temp 98.5 F (36.9 C)  Ht 6' (1.829 m)  Wt 284 lb (128.822 kg)  BMI 38.52 kg/m2  SpO2 97% GENERAL: 51 year old male, obese; NAD HEENT: NCAT, PERRLA, EOMI; sclera clear; no xanthelasma NECK: Positive JVD; no carotid bruits LUNGS: Diminished breath sounds, but no crackles or wheezes  CARDIAC: Regular rhythm, increased rate (S1, S2); 2-3/6 systolic ejection murmur at base; no diastolic blow; no S3 gallop ABDOMEN: Protuberant EXTREMETIES: 3-4+ bilateral peripheral edema (L > R); moderate size area of erythema on the medial aspect of LLB SKIN: warm/dry; no obvious rash/lesions MUSCULOSKELETAL: no joint deformity NEURO: no focal deficit; NL affect   EKG: reviewed and available in Electronic Records   ASSESSMENT & PLAN:  No problem-specific assessment & plan notes found for this encounter.   Gene Yola Paradiso, PAC  Patient seen and examined with Gene Gabby Rackers, PA-C.  Counseling was provided regarding the current medical condition and included: . Diagnosis, impressions, prognosis, recommended diagnostic studies  . Risks and benefits of treatment options  . Instructions for management, treatment and/or follow-up care  . Importance of compliance with treatment, risk factor reduction  . Patient and/or family education    Time spent counseling was 15 minutes and recorded in the Problem List documeted by Gene Emmanuelle Hibbitts , PA-C  [facility] 01/03/2012 12:50 PM

## 2011-12-30 NOTE — Assessment & Plan Note (Signed)
Patient has asymmetric LE edema, worse on left. We'll order venous Dopplers to rule out DVT. Patient also has probable cellulitis of LLE, of which we will defer management to Dr. Sherryll Burger.

## 2011-12-31 DIAGNOSIS — I5023 Acute on chronic systolic (congestive) heart failure: Secondary | ICD-10-CM

## 2012-01-04 ENCOUNTER — Other Ambulatory Visit: Payer: Self-pay | Admitting: *Deleted

## 2012-01-04 DIAGNOSIS — R609 Edema, unspecified: Secondary | ICD-10-CM

## 2012-01-04 DIAGNOSIS — I2510 Atherosclerotic heart disease of native coronary artery without angina pectoris: Secondary | ICD-10-CM

## 2012-01-04 DIAGNOSIS — I10 Essential (primary) hypertension: Secondary | ICD-10-CM

## 2012-01-07 ENCOUNTER — Ambulatory Visit (INDEPENDENT_AMBULATORY_CARE_PROVIDER_SITE_OTHER): Payer: Medicare Other | Admitting: *Deleted

## 2012-01-07 ENCOUNTER — Encounter: Payer: Self-pay | Admitting: *Deleted

## 2012-01-07 VITALS — BP 107/65 | Ht 72.0 in | Wt 276.0 lb

## 2012-01-07 DIAGNOSIS — I4891 Unspecified atrial fibrillation: Secondary | ICD-10-CM

## 2012-01-07 NOTE — Progress Notes (Signed)
Patient presents to office today for EKG per phone dictation. EKG done and routed to MD.

## 2012-01-12 ENCOUNTER — Telehealth: Payer: Self-pay | Admitting: *Deleted

## 2012-01-12 DIAGNOSIS — E875 Hyperkalemia: Secondary | ICD-10-CM

## 2012-01-12 NOTE — Telephone Encounter (Signed)
Message copied by Eustace Moore on Tue Jan 12, 2012  3:39 PM ------      Message from: Learta Codding      Created: Sat Jan 09, 2012  7:49 PM       Decrease K in diet. Repeat BMET in 3-5 days to recheck K+

## 2012-01-12 NOTE — Telephone Encounter (Signed)
Left message for patient to call office on both home and mobile numbers.

## 2012-01-13 NOTE — Telephone Encounter (Signed)
Patient informed and verbalized understanding of plan. Lab order faxed to [facility] lab. 

## 2012-01-29 ENCOUNTER — Telehealth: Payer: Self-pay | Admitting: *Deleted

## 2012-01-29 NOTE — Telephone Encounter (Signed)
Message copied by Eustace Moore on Fri Jan 29, 2012  9:26 AM ------      Message from: Learta Codding      Created: Tue Jan 19, 2012  7:35 PM       Labs excellent creatine improved k stable ntd

## 2012-01-29 NOTE — Telephone Encounter (Signed)
Patient informed. 

## 2012-02-06 ENCOUNTER — Other Ambulatory Visit: Payer: Self-pay | Admitting: Cardiology

## 2012-02-15 ENCOUNTER — Ambulatory Visit (INDEPENDENT_AMBULATORY_CARE_PROVIDER_SITE_OTHER): Payer: Medicare Other | Admitting: Internal Medicine

## 2012-02-15 ENCOUNTER — Other Ambulatory Visit: Payer: Self-pay | Admitting: *Deleted

## 2012-02-15 ENCOUNTER — Encounter: Payer: Self-pay | Admitting: *Deleted

## 2012-02-15 ENCOUNTER — Encounter: Payer: Self-pay | Admitting: Internal Medicine

## 2012-02-15 VITALS — BP 119/70 | HR 63 | Ht 71.0 in | Wt 290.4 lb

## 2012-02-15 DIAGNOSIS — I10 Essential (primary) hypertension: Secondary | ICD-10-CM

## 2012-02-15 DIAGNOSIS — I4892 Unspecified atrial flutter: Secondary | ICD-10-CM

## 2012-02-15 DIAGNOSIS — I5023 Acute on chronic systolic (congestive) heart failure: Secondary | ICD-10-CM

## 2012-02-15 DIAGNOSIS — Z01810 Encounter for preprocedural cardiovascular examination: Secondary | ICD-10-CM

## 2012-02-15 DIAGNOSIS — R0602 Shortness of breath: Secondary | ICD-10-CM

## 2012-02-15 MED ORDER — FUROSEMIDE 80 MG PO TABS: 80.0000 mg | ORAL_TABLET | Freq: Three times a day (TID) | ORAL | Status: DC

## 2012-02-15 NOTE — Progress Notes (Signed)
 Primary Care Physician: SHAH,ASHISH, MD Referring Physician:  Dr DeGent   [name] is a 51 y.o. male with a h/o nonischemic CM who presents with recently diagnosed atrial flutter.  He has been seen by me 53/12 at which time we discussed ICD implantation.  He declined ICD at that time.  He did well until May when he developed progressive SOB and edema.  He was noted to have typical appearing atrial flutter and was therefore cardioverted by Dr DeGent at [facility].  He was placed on pradaxa and amiodarone.  He was also placed on lasix with plans to see me today for EP consultation regarding his atrial flutter. He remains in sinus rhythm but continues to have difficulty with volume overload.  He has gained an additional 6 pounds since his discharge.  He reports moderate SOB with activity as well as worsening BLE edema.  He reports compliance with lasix.  Today, he denies symptoms of palpitations, chest pain,  orthopnea, PND,   presyncope, syncope, or neurologic sequela. The patient is tolerating medications without difficulties and is otherwise without complaint today.   Past Medical History  Diagnosis Date  . Hypertension   . Morbid obesity   . Atrial flutter  10/01/2006, 12/30/11  . Type II or unspecified type diabetes mellitus without mention of complication, not stated as uncontrolled   . OSA (obstructive sleep apnea)     compliant with CPAP  . Nonischemic cardiomyopathy     ejection fraction 30-35%,   . Chronic systolic dysfunction of left ventricle   . Unspecified disorder of kidney and ureter   . RBBB, anterior fascicular block and incomplete LBBB    Past Surgical History  Procedure Date  . None     Current Outpatient Prescriptions  Medication Sig Dispense Refill  . allopurinol (ZYLOPRIM) 100 MG tablet Take 100 mg by mouth daily.        . amiodarone (PACERONE) 200 MG tablet Take 200 mg by mouth 2 (two) times daily.      . amLODipine (NORVASC) 10 MG tablet Take 10 mg by  mouth daily.        . atorvastatin (LIPITOR) 10 MG tablet Take 10 mg by mouth daily.        . cloNIDine (CATAPRES) 0.2 MG tablet Take 0.2 mg by mouth 2 (two) times daily.        . digoxin (LANOXIN) 0.25 MG tablet Take one-half by mouth daily   15 tablet  6  . furosemide (LASIX) 80 MG tablet Take 1 tablet (80 mg total) by mouth 2 (two) times daily.  60 tablet  6  . gemfibrozil (LOPID) 600 MG tablet Take 600 mg by mouth 2 (two) times daily before a meal.        . glimepiride (AMARYL) 2 MG tablet Take 2 mg by mouth daily before breakfast.      . glyBURIDE-metformin (GLUCOVANCE) 5-500 MG per tablet Take 2 tablets by mouth 2 (two) times daily with a meal.        . insulin aspart (NOVOLOG) 100 UNIT/ML injection Inject 10 Units into the skin 2 (two) times daily before a meal.       . insulin detemir (LEVEMIR) 100 UNIT/ML injection Inject 70 Units into the skin 2 (two) times daily.        . metoprolol (LOPRESSOR) 100 MG tablet Take 100 mg by mouth 2 (two) times daily.        . NIASPAN 500 MG CR tablet TAKE   ONE TABLET AT BEDTIME  30 each  6  . PRADAXA 150 MG CAPS TAKE 1 CAPSULE EVERY 12 HOURS  60 capsule  6  . prazosin (MINIPRESS) 1 MG capsule TAKE (1) CAPSULE TWICE DAILY.  60 capsule  6  . saxagliptin HCl (ONGLYZA) 5 MG TABS tablet Take 5 mg by mouth daily.      . valsartan (DIOVAN) 320 MG tablet Take 320 mg by mouth daily.          No Known Allergies  History   Social History  . Marital Status: Married    Spouse Name: N/A    Number of Children: N/A  . Years of Education: N/A   Occupational History  . DISABLED    Social History Main Topics  . Smoking status: Never Smoker   . Smokeless tobacco: Never Used  . Alcohol Use: No  . Drug Use: No  . Sexually Active: Not on file   Other Topics Concern  . Not on file   Social History Narrative   Pt lives in [place] alone.  Disabled.  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    Family History  Problem Relation Age of Onset  .  Asthma Father 33    DECEASED  . Diabetes      ROS- All systems are reviewed and negative except as per the HPI above  Physical Exam: Filed Vitals:   02/15/12 0845  BP: 119/70  Pulse: 63  Height: 5' 11" (1.803 m)  Weight: 290 lb 6.4 oz (131.725 kg)    GEN- The patient is overweight appearing, alert and oriented x 3 today.   Head- normocephalic, atraumatic Eyes-  Sclera clear, conjunctiva pink Ears- hearing intact Oropharynx- clear Neck- supple, JVP 10cm Lungs- Clear to ausculation bilaterally, normal work of breathing Heart- Regular rate and rhythm, no murmurs, rubs or gallops, PMI not laterally displaced GI- soft, NT, ND, + BS Extremities- no clubbing, cyanosis, 2+ pitting edema MS- no significant deformity or atrophy Skin- no rash or lesion Psych- euthymic mood, full affect Neuro- strength and sensation are intact  EKG today revals sinus rhythm 62 bpm, PR 242, incomplete LBBB (QRS 118), Qtc 466  Assessment and Plan:  

## 2012-02-15 NOTE — Assessment & Plan Note (Signed)
The patient has recurrent typical appearing atrial flutter.  He does not tolerate atrial flutter due to worsening CHF. Therapeutic strategies for atrial flutter including medicine and ablation were discussed in detail with the patient today. Risk, benefits, and alternatives to EP study and radiofrequency ablation were also discussed in detail today. These risks include but are not limited to stroke, bleeding, vascular damage, tamponade, perforation, damage to the heart and other structures, AV block requiring pacemaker, worsening renal function, and death.  I have informed him that with first degree AV block and incomplete LBBB, risks for AV block are increased.   The patient understands these risk and wishes to proceed.  We will therefore proceed with catheter ablation at the next available time.  He will require anesthesia for the procedure. I have informed him that he should continue uninterupted pradaxa in the interim.

## 2012-02-15 NOTE — Assessment & Plan Note (Signed)
He remains volume overloaded. I have spoken with Dr Andee Lineman this am.  Presently, our plan is to give IV lasix today.  He will also increase lasix to 80mg  BID and return for BMET and follow-up with Dr Andee Lineman later this week.

## 2012-02-15 NOTE — Patient Instructions (Addendum)
   [facility] Las[facility] 80[facility]    Flutter Ablation - see info sheet given

## 2012-02-15 NOTE — Assessment & Plan Note (Signed)
Stable No change required today  

## 2012-02-22 ENCOUNTER — Other Ambulatory Visit: Payer: Self-pay | Admitting: Cardiology

## 2012-02-22 ENCOUNTER — Encounter (HOSPITAL_COMMUNITY): Payer: Self-pay | Admitting: Pharmacy Technician

## 2012-02-26 ENCOUNTER — Encounter: Payer: Self-pay | Admitting: Internal Medicine

## 2012-03-03 MED ORDER — SODIUM CHLORIDE 0.9 % IV SOLN
INTRAVENOUS | Status: DC
  Administered 2012-03-04: 1000 mL via INTRAVENOUS

## 2012-03-04 ENCOUNTER — Ambulatory Visit (HOSPITAL_COMMUNITY)
Admission: RE | Admit: 2012-03-04 | Discharge: 2012-03-04 | Disposition: A | Discharge status: Home / Self Care | Payer: Medicare Other | Source: Ambulatory Visit | Attending: Internal Medicine | Admitting: Internal Medicine

## 2012-03-04 ENCOUNTER — Encounter (HOSPITAL_COMMUNITY): Payer: Self-pay | Admitting: Anesthesiology

## 2012-03-04 ENCOUNTER — Ambulatory Visit (HOSPITAL_COMMUNITY): Payer: Medicare Other | Admitting: Anesthesiology

## 2012-03-04 ENCOUNTER — Encounter (HOSPITAL_COMMUNITY)
Admission: RE | Disposition: A | Discharge status: Home / Self Care | Payer: Self-pay | Source: Ambulatory Visit | Attending: Internal Medicine

## 2012-03-04 DIAGNOSIS — I4510 Unspecified right bundle-branch block: Secondary | ICD-10-CM | POA: Insufficient documentation

## 2012-03-04 DIAGNOSIS — Z7901 Long term (current) use of anticoagulants: Secondary | ICD-10-CM

## 2012-03-04 DIAGNOSIS — I519 Heart disease, unspecified: Secondary | ICD-10-CM | POA: Diagnosis present

## 2012-03-04 DIAGNOSIS — I4892 Unspecified atrial flutter: Secondary | ICD-10-CM | POA: Diagnosis present

## 2012-03-04 DIAGNOSIS — I428 Other cardiomyopathies: Secondary | ICD-10-CM | POA: Insufficient documentation

## 2012-03-04 DIAGNOSIS — G4733 Obstructive sleep apnea (adult) (pediatric): Secondary | ICD-10-CM | POA: Insufficient documentation

## 2012-03-04 DIAGNOSIS — I5022 Chronic systolic (congestive) heart failure: Secondary | ICD-10-CM | POA: Insufficient documentation

## 2012-03-04 DIAGNOSIS — I429 Cardiomyopathy, unspecified: Secondary | ICD-10-CM | POA: Diagnosis present

## 2012-03-04 DIAGNOSIS — E119 Type 2 diabetes mellitus without complications: Secondary | ICD-10-CM | POA: Insufficient documentation

## 2012-03-04 DIAGNOSIS — I10 Essential (primary) hypertension: Secondary | ICD-10-CM | POA: Diagnosis present

## 2012-03-04 DIAGNOSIS — I509 Heart failure, unspecified: Secondary | ICD-10-CM | POA: Insufficient documentation

## 2012-03-04 HISTORY — PX: ATRIAL FLUTTER ABLATION: SHX5733

## 2012-03-04 LAB — GLUCOSE, CAPILLARY: Glucose-Capillary: 209 mg/dL — ABNORMAL HIGH (ref 70–99)

## 2012-03-04 LAB — BASIC METABOLIC PANEL
CO2: 22 mEq/L (ref 19–32)
Calcium: 9.2 mg/dL (ref 8.4–10.5)
Glucose, Bld: 187 mg/dL — ABNORMAL HIGH (ref 70–99)
Sodium: 136 mEq/L (ref 135–145)

## 2012-03-04 SURGERY — ATRIAL FLUTTER ABLATION
Anesthesia: Monitor Anesthesia Care

## 2012-03-04 MED ORDER — ATORVASTATIN CALCIUM 10 MG PO TABS: 10.0000 mg | ORAL_TABLET | Freq: Every day | ORAL | Status: DC

## 2012-03-04 MED ORDER — ONDANSETRON HCL 4 MG/2ML IJ SOLN: 4.0000 mg | Freq: Four times a day (QID) | INTRAMUSCULAR | Status: DC | PRN

## 2012-03-04 MED ORDER — IRBESARTAN 300 MG PO TABS: 300.0000 mg | ORAL_TABLET | Freq: Every day | ORAL | Status: DC

## 2012-03-04 MED ORDER — SODIUM CHLORIDE 0.9 % IV SOLN
INTRAVENOUS | Status: DC | PRN
  Administered 2012-03-04: 07:00:00 via INTRAVENOUS

## 2012-03-04 MED ORDER — AMLODIPINE BESYLATE 10 MG PO TABS
10.0000 mg | ORAL_TABLET | Freq: Every day | ORAL | Status: DC
  Administered 2012-03-04: 10 mg via ORAL
  Filled 2012-03-04: qty 1

## 2012-03-04 MED ORDER — HYDROCODONE-ACETAMINOPHEN 5-325 MG PO TABS: 1.0000 | ORAL_TABLET | ORAL | Status: DC | PRN

## 2012-03-04 MED ORDER — METOPROLOL TARTRATE 100 MG PO TABS
100.0000 mg | ORAL_TABLET | Freq: Two times a day (BID) | ORAL | Status: DC
  Administered 2012-03-04: 100 mg via ORAL
  Filled 2012-03-04 (×2): qty 1

## 2012-03-04 MED ORDER — GLYBURIDE 5 MG PO TABS
10.0000 mg | ORAL_TABLET | Freq: Two times a day (BID) | ORAL | Status: DC
  Administered 2012-03-04: 10 mg via ORAL
  Filled 2012-03-04 (×2): qty 2

## 2012-03-04 MED ORDER — SODIUM CHLORIDE 0.9 % IJ SOLN: 3.0000 mL | Freq: Two times a day (BID) | INTRAMUSCULAR | Status: DC

## 2012-03-04 MED ORDER — SODIUM CHLORIDE 0.9 % IV SOLN: 250.0000 mL | INTRAVENOUS | Status: DC | PRN

## 2012-03-04 MED ORDER — PRAZOSIN HCL 1 MG PO CAPS
1.0000 mg | ORAL_CAPSULE | Freq: Two times a day (BID) | ORAL | Status: DC
  Filled 2012-03-04 (×2): qty 1

## 2012-03-04 MED ORDER — FENTANYL CITRATE 0.05 MG/ML IJ SOLN
INTRAMUSCULAR | Status: DC | PRN
  Administered 2012-03-04 (×2): 50 ug via INTRAVENOUS

## 2012-03-04 MED ORDER — PROPOFOL 10 MG/ML IV EMUL
INTRAVENOUS | Status: DC | PRN
  Administered 2012-03-04: 200 mg via INTRAVENOUS

## 2012-03-04 MED ORDER — GLYBURIDE-METFORMIN 5-500 MG PO TABS: 2.0000 | ORAL_TABLET | Freq: Two times a day (BID) | ORAL | Status: DC

## 2012-03-04 MED ORDER — INSULIN DETEMIR 100 UNIT/ML ~~LOC~~ SOLN
62.0000 [IU] | Freq: Two times a day (BID) | SUBCUTANEOUS | Status: DC
  Administered 2012-03-04: 62 [IU] via SUBCUTANEOUS

## 2012-03-04 MED ORDER — SODIUM CHLORIDE 0.9 % IJ SOLN: 3.0000 mL | INTRAMUSCULAR | Status: DC | PRN

## 2012-03-04 MED ORDER — CLONIDINE HCL 0.2 MG PO TABS
0.2000 mg | ORAL_TABLET | Freq: Two times a day (BID) | ORAL | Status: DC
  Filled 2012-03-04: qty 1

## 2012-03-04 MED ORDER — ALLOPURINOL 300 MG PO TABS: 300.0000 mg | ORAL_TABLET | Freq: Every day | ORAL | Status: DC

## 2012-03-04 MED ORDER — INSULIN DETEMIR 100 UNIT/ML ~~LOC~~ SOLN
70.0000 [IU] | Freq: Two times a day (BID) | SUBCUTANEOUS | Status: DC
  Filled 2012-03-04: qty 10

## 2012-03-04 MED ORDER — METFORMIN HCL 500 MG PO TABS
1000.0000 mg | ORAL_TABLET | Freq: Two times a day (BID) | ORAL | Status: DC
  Administered 2012-03-04: 1000 mg via ORAL
  Filled 2012-03-04 (×2): qty 2

## 2012-03-04 MED ORDER — FUROSEMIDE 80 MG PO TABS
80.0000 mg | ORAL_TABLET | Freq: Three times a day (TID) | ORAL | Status: DC
  Administered 2012-03-04: 80 mg via ORAL
  Filled 2012-03-04 (×2): qty 1

## 2012-03-04 MED ORDER — GLYBURIDE 5 MG PO TABS
5.0000 mg | ORAL_TABLET | Freq: Two times a day (BID) | ORAL | Status: DC
  Filled 2012-03-04 (×2): qty 1

## 2012-03-04 MED ORDER — SUCCINYLCHOLINE CHLORIDE 20 MG/ML IJ SOLN
INTRAMUSCULAR | Status: DC | PRN
  Administered 2012-03-04: 100 mg via INTRAVENOUS

## 2012-03-04 MED ORDER — LINAGLIPTIN 5 MG PO TABS
5.0000 mg | ORAL_TABLET | Freq: Every day | ORAL | Status: DC
  Administered 2012-03-04: 5 mg via ORAL
  Filled 2012-03-04: qty 1

## 2012-03-04 MED ORDER — GEMFIBROZIL 600 MG PO TABS
600.0000 mg | ORAL_TABLET | Freq: Two times a day (BID) | ORAL | Status: DC
  Administered 2012-03-04: 600 mg via ORAL
  Filled 2012-03-04 (×2): qty 1

## 2012-03-04 MED ORDER — ACETAMINOPHEN 325 MG PO TABS: 650.0000 mg | ORAL_TABLET | ORAL | Status: DC | PRN

## 2012-03-04 MED ORDER — LIDOCAINE HCL (CARDIAC) 20 MG/ML IV SOLN
INTRAVENOUS | Status: DC | PRN
  Administered 2012-03-04: 50 mg via INTRAVENOUS

## 2012-03-04 MED ORDER — MIDAZOLAM HCL 5 MG/5ML IJ SOLN
INTRAMUSCULAR | Status: DC | PRN
  Administered 2012-03-04: 1 mg via INTRAVENOUS

## 2012-03-04 MED ORDER — DIGOXIN 125 MCG PO TABS: 0.1250 mg | ORAL_TABLET | Freq: Every day | ORAL | Status: DC

## 2012-03-04 MED ORDER — GLIMEPIRIDE 2 MG PO TABS
2.0000 mg | ORAL_TABLET | Freq: Every day | ORAL | Status: DC
  Administered 2012-03-04: 2 mg via ORAL
  Filled 2012-03-04: qty 1

## 2012-03-04 MED ORDER — LINAGLIPTIN 5 MG PO TABS
5.0000 mg | ORAL_TABLET | Freq: Every day | ORAL | Status: DC
  Filled 2012-03-04: qty 1

## 2012-03-04 MED ORDER — ONDANSETRON HCL 4 MG/2ML IJ SOLN
INTRAMUSCULAR | Status: DC | PRN
  Administered 2012-03-04: 4 mg via INTRAVENOUS

## 2012-03-04 MED ORDER — NIACIN ER (ANTIHYPERLIPIDEMIC) 500 MG PO TBCR
500.0000 mg | EXTENDED_RELEASE_TABLET | Freq: Every day | ORAL | Status: DC
  Filled 2012-03-04: qty 1

## 2012-03-04 MED ORDER — METFORMIN HCL 500 MG PO TABS
500.0000 mg | ORAL_TABLET | Freq: Two times a day (BID) | ORAL | Status: DC
  Filled 2012-03-04 (×2): qty 1

## 2012-03-04 MED ORDER — INSULIN ASPART 100 UNIT/ML ~~LOC~~ SOLN: 10.0000 [IU] | Freq: Two times a day (BID) | SUBCUTANEOUS | Status: DC

## 2012-03-04 MED ORDER — BUPIVACAINE HCL (PF) 0.25 % IJ SOLN
INTRAMUSCULAR | Status: AC
  Filled 2012-03-04: qty 60

## 2012-03-04 NOTE — Transfer of Care (Signed)
Immediate Anesthesia Transfer of Care Note  Patient: [name]  Procedure(s) Performed: Procedure(s) (LRB): ATRIAL FLUTTER ABLATION (N/A)  Patient Location: PACU  Anesthesia Type: General  Level of Consciousness: awake, alert  and oriented  Airway & Oxygen Therapy: Patient Spontanous Breathing and Patient connected to face mask oxygen  Post-op Assessment: Report given to PACU RN and Post -op Vital signs reviewed and stable  Post vital signs: Reviewed and stable  Complications: No apparent anesthesia complications

## 2012-03-04 NOTE — Preoperative (Signed)
Beta Blockers   Reason not to administer Beta Blockers:Lopressor last pm 2100

## 2012-03-04 NOTE — Op Note (Signed)
NAMEJOAB, CARDEN NO.:  [id number]  MEDICAL RECORD NO.:  [id number]  LOCATION:  [facility]                         FACILITY:  [facility]  PHYSICIAN:  Hillis Range, MD       DATE OF BIRTH:  02/25/1961  DATE OF PROCEDURE: DATE OF DISCHARGE:                              OPERATIVE REPORT   PREPROCEDURE DIAGNOSIS:  Atrial flutter.  POSTPROCEDURE DIAGNOSIS:  Atrial flutter.  PROCEDURES: 1. Comprehensive electrophysiology study. 2. Coronary sinus pacing and recording. 3. Mapping of supraventricular tachycardia. 4. Mapping of atrial flutter. 5. Ablation of atrial flutter. 6. Arrhythmia induction with pacing.  INTRODUCTION:  Mr. Hilliker is a pleasant 51 year old gentleman with a longstanding history of chronic systolic dysfunction.  He recently presented with acute decompensated congestive heart failure and was found to have typical atrial flutter.  He underwent cardioversion by Dr. Andee Lineman and was placed on amiodarone.  He was initiated on Pradaxa for anticoagulation.  He presents today for EP study and radiofrequency ablation.  DESCRIPTION OF PROCEDURE:  Informed written consent was obtained, and the patient was brought to the Electrophysiology Lab in the fasting state.  He was adequately sedated with general anesthesia as outlined in the anesthesia report.  The patient's right groin was prepped and draped in the usual sterile fashion by the EP lab staff.  Using a percutaneous Seldinger technique, one 6, one 7, and one 8-French hemostasis sheaths were placed in the right common femoral vein.  A 6-French decapolar Polaris X catheter was introduced through the right common femoral vein and advanced into the coronary sinus for recording and pacing from this location.  A 6-French quadripolar Josephson catheter was introduced through the right common femoral vein and advanced into the right ventricle for recording and pacing, but this catheter was then pulled back to the His  bundle location.  The patient presented to the electrophysiology lab in sinus rhythm.  His PR interval measured 199 msec with a QRS duration of 137 msec and a QT interval 434 msec.  His RR interval measured 815 msec.  His AH interval measured 118 msec with an HV interval of 54 msec.  Ventricular pacing was performed which revealed VA dissociation at baseline.  Rapid atrial pacing was performed which revealed an AV Wenckebach cycle length of 430 msec.  There was no evidence of PR greater than RR.  Rapid atrial pacing was continued down to a cycle length of 200 msec with no arrhythmias observed.  Multiple attempts to induce atrial flutter were made, however, this was unsuccessful today, likely due to the patient's recently initiation on amiodarone.  As the patient has typical atrial flutter documented, I felt that the most prudent approach at this point was to proceed with cavotricuspid isthmus ablation.  A 7-French [employer] II XP 8-mm ablation catheter was introduced through the right common femoral vein and advanced into the right atrium.  Atrial mapping along the cavotricuspid isthmus was performed.  This demonstrated a rather long isthmus.  A series of 8 radiofrequency applications were delivered along the cavotricuspid isthmus with a target temperature of 60 degrees at 50 watts for 120 seconds each.  During the 8th radiofrequency application, complete  bidirectional isthmus block was achieved with a stimulus to earliest atrial activation recorded bidirectional across the isthmus measuring 190 msec.  A dual decapolar circular mapping catheter was introduced through the right common femoral vein and advanced into the right atrium.  This Halo catheter was then positioned around the cavotricuspid isthmus.  Differential atrial pacing was again performed which revealed complete bidirectional isthmus block.  The patient was observed for 20 minutes without return of conduction  through the isthmus.  Following ablation, the AH interval measured 98 msec with an HV interval of 53 msec.  The AV Wenckebach cycle length remained 430 msec.  Rapid atrial pacing was again performed down to a cycle length of 250 msec without any arrhythmias observed today.  The procedure was therefore considered completed.  All catheters were removed and the sheaths were aspirated and flushed.  The sheaths were removed and hemostasis was assured.  There were no early apparent complications.  CONCLUSIONS: 1. Sinus rhythm upon presentation. 2. Atrial flutter, not induced today, likely due to chronic amiodarone     therapy. 3. Empiric cavotricuspid isthmus ablation was performed with complete     bidirectional isthmus block achieved. 4. No inducible arrhythmias following ablation. 5. No early apparent complications.     Hillis Range, MD     JA/MEDQ  D:  03/04/2012  T:  03/04/2012  Job:  [id number]  cc:   Learta Codding, MD,FACC

## 2012-03-04 NOTE — Brief Op Note (Signed)
03/04/2012  9:35 AM  PATIENT:  [name]  51 y.o. male  PRE-OPERATIVE DIAGNOSIS:  aflutter  POST-OPERATIVE DIAGNOSIS:  Atrial flutter  PROCEDURE:  Procedure(s) (LRB): ATRIAL FLUTTER ABLATION (N/A)  SURGEON:  Surgeon(s) and Role:    * Hillis Range, MD - Primary  PHYSICIAN ASSISTANT:   ASSISTANTS: none   ANESTHESIA:   general  EBL:     BLOOD ADMINISTERED:none  DRAINS: none   LOCAL MEDICATIONS USED:  NONE  SPECIMEN:  No Specimen  DISPOSITION OF SPECIMEN:  N/A  COUNTS:  YES  TOURNIQUET:  * No tourniquets in log *  DICTATION: .Other Dictation: Dictation Number [id number]  PLAN OF CARE: Place in observation  PATIENT DISPOSITION:  PACU - hemodynamically stable.   Delay start of Pharmacological VTE agent (>24hrs) due to surgical blood loss or risk of bleeding: not applicable

## 2012-03-04 NOTE — H&P (View-Only) (Signed)
Primary Care Physician: Kirstie Peri, MD Referring Physician:  Dr Lucia Gaskins [name] is a 51 y.o. male with a h/o nonischemic CM who presents with recently diagnosed atrial flutter.  He has been seen by me 12/12 at which time we discussed ICD implantation.  He declined ICD at that time.  He did well until May when he developed progressive SOB and edema.  He was noted to have typical appearing atrial flutter and was therefore cardioverted by Dr Andee Lineman at [facility].  He was placed on pradaxa and amiodarone.  He was also placed on lasix with plans to see me today for EP consultation regarding his atrial flutter. He remains in sinus rhythm but continues to have difficulty with volume overload.  He has gained an additional 6 pounds since his discharge.  He reports moderate SOB with activity as well as worsening BLE edema.  He reports compliance with lasix.  Today, he denies symptoms of palpitations, chest pain,  orthopnea, PND,   presyncope, syncope, or neurologic sequela. The patient is tolerating medications without difficulties and is otherwise without complaint today.   Past Medical History  Diagnosis Date  . Hypertension   . Morbid obesity   . Atrial flutter  10/01/2006, 12/30/11  . Type II or unspecified type diabetes mellitus without mention of complication, not stated as uncontrolled   . OSA (obstructive sleep apnea)     compliant with CPAP  . Nonischemic cardiomyopathy     ejection fraction 30-35%,   . Chronic systolic dysfunction of left ventricle   . Unspecified disorder of kidney and ureter   . RBBB, anterior fascicular block and incomplete LBBB    Past Surgical History  Procedure Date  . None     Current Outpatient Prescriptions  Medication Sig Dispense Refill  . allopurinol (ZYLOPRIM) 100 MG tablet Take 100 mg by mouth daily.        [name] amiodarone (PACERONE) 200 MG tablet Take 200 mg by mouth 2 (two) times daily.      [name] amLODipine (NORVASC) 10 MG tablet Take 10 mg by  mouth daily.        [name] atorvastatin (LIPITOR) 10 MG tablet Take 10 mg by mouth daily.        . cloNIDine (CATAPRES) 0.2 MG tablet Take 0.2 mg by mouth 2 (two) times daily.        . digoxin (LANOXIN) 0.25 MG tablet Take one-half by mouth daily   15 tablet  6  . furosemide (LASIX) 80 MG tablet Take 1 tablet (80 mg total) by mouth 2 (two) times daily.  60 tablet  6  . gemfibrozil (LOPID) 600 MG tablet Take 600 mg by mouth 2 (two) times daily before a meal.        . glimepiride (AMARYL) 2 MG tablet Take 2 mg by mouth daily before breakfast.      . glyBURIDE-metformin (GLUCOVANCE) 5-500 MG per tablet Take 2 tablets by mouth 2 (two) times daily with a meal.        . insulin aspart (NOVOLOG) 100 UNIT/ML injection Inject 10 Units into the skin 2 (two) times daily before a meal.       . insulin detemir (LEVEMIR) 100 UNIT/ML injection Inject 70 Units into the skin 2 (two) times daily.        . metoprolol (LOPRESSOR) 100 MG tablet Take 100 mg by mouth 2 (two) times daily.        [name] NIASPAN 500 MG CR tablet TAKE  ONE TABLET AT BEDTIME  30 each  6  . PRADAXA 150 MG CAPS TAKE 1 CAPSULE EVERY 12 HOURS  60 capsule  6  . prazosin (MINIPRESS) 1 MG capsule TAKE (1) CAPSULE TWICE DAILY.  60 capsule  6  . saxagliptin HCl (ONGLYZA) 5 MG TABS tablet Take 5 mg by mouth daily.      . valsartan (DIOVAN) 320 MG tablet Take 320 mg by mouth daily.          No Known Allergies  History   Social History  . Marital Status: Married    Spouse Name: N/A    Number of Children: N/A  . Years of Education: N/A   Occupational History  . DISABLED    Social History Main Topics  . Smoking status: Never Smoker   . Smokeless tobacco: Never Used  . Alcohol Use: No  . Drug Use: No  . Sexually Active: Not on file   Other Topics Concern  . Not on file   Social History Narrative   Pt lives in [place] alone.  Disabled.  Previously worked in Designer, fashion/clothing and drove a truck.    Family History  Problem Relation Age of Onset  .  Asthma Father 63    DECEASED  . Diabetes      ROS- All systems are reviewed and negative except as per the HPI above  Physical Exam: Filed Vitals:   02/15/12 0845  BP: 119/70  Pulse: 63  Height: 5\' 11"  (1.803 m)  Weight: 290 lb 6.4 oz (131.725 kg)    GEN- The patient is overweight appearing, alert and oriented x 3 today.   Head- normocephalic, atraumatic Eyes-  Sclera clear, conjunctiva pink Ears- hearing intact Oropharynx- clear Neck- supple, JVP 10cm Lungs- Clear to ausculation bilaterally, normal work of breathing Heart- Regular rate and rhythm, no murmurs, rubs or gallops, PMI not laterally displaced GI- soft, NT, ND, + BS Extremities- no clubbing, cyanosis, 2+ pitting edema MS- no significant deformity or atrophy Skin- no rash or lesion Psych- euthymic mood, full affect Neuro- strength and sensation are intact  EKG today revals sinus rhythm 62 bpm, PR 242, incomplete LBBB (QRS 118), Qtc 466  Assessment and Plan:

## 2012-03-04 NOTE — Interval H&P Note (Signed)
History and Physical Interval Note:  03/04/2012 7:20 AM  [name]  has presented today for surgery, with the diagnosis of aflutter  The various methods of treatment have been discussed with the patient and family. After consideration of risks, benefits and other options for treatment, the patient has consented to  Procedure(s) (LRB): ATRIAL FLUTTER ABLATION (N/A) as a surgical intervention .  The patient's history has been reviewed, patient examined, no change in status, stable for surgery.  I have reviewed the patient's chart and labs.  Questions were answered to the patient's satisfaction.     Hillis Range

## 2012-03-04 NOTE — Progress Notes (Signed)
Doing well s/p ablation No concerns He wants to go home.  We will stop amiodarone and pradaxa. Follow-up with me in 4 weeks.

## 2012-03-04 NOTE — Anesthesia Preprocedure Evaluation (Addendum)
Anesthesia Evaluation  Patient identified by MRN, date of birth, ID band Patient awake    Reviewed: Allergy & Precautions, H&P , NPO status , Patient's Chart, lab work & pertinent test results  Airway Mallampati: II TM Distance: >3 FB Neck ROM: full    Dental   Pulmonary shortness of breath, sleep apnea ,          Cardiovascular hypertension, +CHF + dysrhythmias Rhythm:irregular Rate:Normal     Neuro/Psych    GI/Hepatic   Endo/Other  Well Controlled, Type 2, Oral Hypoglycemic Agents and Insulin DependentMorbid obesity  Renal/GU      Musculoskeletal   Abdominal   Peds  Hematology   Anesthesia Other Findings   Reproductive/Obstetrics                          Anesthesia Physical Anesthesia Plan  ASA: III  Anesthesia Plan: General   Post-op Pain Management:    Induction: Intravenous  Airway Management Planned: Oral ETT and LMA  Additional Equipment:   Intra-op Plan:   Post-operative Plan: Extubation in OR  Informed Consent: I have reviewed the patients History and Physical, chart, labs and discussed the procedure including the risks, benefits and alternatives for the proposed anesthesia with the patient or authorized representative who has indicated his/her understanding and acceptance.     Plan Discussed with: CRNA, Anesthesiologist and Surgeon  Anesthesia Plan Comments:         Anesthesia Quick Evaluation

## 2012-03-04 NOTE — Progress Notes (Signed)
D/c instructions were reviewed with pt, education provided. Pt verbalized understanding of all instructions. Pt d/c'd via wheelchair with belongings with family.

## 2012-03-04 NOTE — Progress Notes (Signed)
Pt ambulated in hall with RN, tolerated well. Denied CP, SOB or dizziness. Has eaten dinner, site at level 0. Pt called his family and they will be here to pick him up.

## 2012-03-04 NOTE — Anesthesia Postprocedure Evaluation (Signed)
Anesthesia Post Note  Patient: [name]  Procedure(s) Performed: Procedure(s) (LRB): ATRIAL FLUTTER ABLATION (N/A)  Anesthesia type: General  Patient location: PACU  Post pain: Pain level controlled and Adequate analgesia  Post assessment: Post-op Vital signs reviewed, Patient's Cardiovascular Status Stable, Respiratory Function Stable, Patent Airway and Pain level controlled  Last Vitals:  Filed Vitals:   03/04/12 0548  BP: 161/88  Pulse: 97  Temp: 36.8 C  Resp: 18    Post vital signs: Reviewed and stable  Level of consciousness: awake, alert  and oriented  Complications: No apparent anesthesia complications

## 2012-03-07 ENCOUNTER — Other Ambulatory Visit: Payer: Self-pay | Admitting: Cardiology

## 2012-03-09 ENCOUNTER — Ambulatory Visit (INDEPENDENT_AMBULATORY_CARE_PROVIDER_SITE_OTHER): Payer: Medicare Other | Admitting: Physician Assistant

## 2012-03-09 ENCOUNTER — Encounter: Payer: Self-pay | Admitting: Physician Assistant

## 2012-03-09 VITALS — BP 136/75 | HR 79 | Ht 71.0 in | Wt 301.0 lb

## 2012-03-09 DIAGNOSIS — I4891 Unspecified atrial fibrillation: Secondary | ICD-10-CM

## 2012-03-09 DIAGNOSIS — I429 Cardiomyopathy, unspecified: Secondary | ICD-10-CM

## 2012-03-09 DIAGNOSIS — R600 Localized edema: Secondary | ICD-10-CM

## 2012-03-09 DIAGNOSIS — I4892 Unspecified atrial flutter: Secondary | ICD-10-CM

## 2012-03-09 DIAGNOSIS — R0602 Shortness of breath: Secondary | ICD-10-CM

## 2012-03-09 DIAGNOSIS — E785 Hyperlipidemia, unspecified: Secondary | ICD-10-CM

## 2012-03-09 DIAGNOSIS — R609 Edema, unspecified: Secondary | ICD-10-CM

## 2012-03-09 MED ORDER — SPIRONOLACTONE 25 MG PO TABS: 25.0000 mg | ORAL_TABLET | Freq: Every day | ORAL | Status: DC

## 2012-03-09 MED ORDER — TORSEMIDE 20 MG PO TABS: 40.0000 mg | ORAL_TABLET | Freq: Two times a day (BID) | ORAL | Status: DC

## 2012-03-09 NOTE — Progress Notes (Signed)
Primary Cardiologist: Lewayne Bunting, MD   HPI: Patient presents for early scheduled followup, per Dr. Johney Frame, for continued close monitoring/management of A/C SHF, in context of NICM. He also underwent recent successful RF ablation of recurrent, typical atrial flutter, by Dr. Johney Frame, on July 19.    when last seen, patient had a weight of 290 pounds, and appeared volume overloaded. Lasix was increased to current 3 times a day dosing regimen. Nevertheless, patient reports today that he notes no appreciable difference. In fact, his weight has increased (11 pounds by today's weight). Clinically, he denies any PND, orthopnea, or exacerbation of his chronic LE edema. He also reports only mild DOE, when walking a little brown.  Following his recent ablation procedure, he denies any tachycardia palpitations. He denies any complications of right groin incision site.  12-lead EKG today indicates NSR at 79 bpm, with first-degree AV block.   No Known Allergies  Current Outpatient Prescriptions  Medication Sig Dispense Refill  . allopurinol (ZYLOPRIM) 300 MG tablet Take 300 mg by mouth daily.      [name] atorvastatin (LIPITOR) 10 MG tablet Take 10 mg by mouth daily.        . cloNIDine (CATAPRES) 0.2 MG tablet Take 0.2 mg by mouth 2 (two) times daily.        . digoxin (LANOXIN) 0.25 MG tablet TAKE ONE-HALF (1/2) TABLET ONCE DAILY  15 tablet  3  . gemfibrozil (LOPID) 600 MG tablet Take 600 mg by mouth 2 (two) times daily before a meal.        . glimepiride (AMARYL) 2 MG tablet Take 2 mg by mouth daily before supper.       . glyBURIDE-metformin (GLUCOVANCE) 5-500 MG per tablet Take 2 tablets by mouth 2 (two) times daily with a meal.        . insulin aspart (NOVOLOG) 100 UNIT/ML injection Inject 10 Units into the skin 2 (two) times daily before a meal.       . insulin detemir (LEVEMIR) 100 UNIT/ML injection Inject 62 Units into the skin 2 (two) times daily.      . metoprolol (LOPRESSOR) 100 MG tablet Take 100 mg by  mouth 2 (two) times daily.        . niacin (NIASPAN) 500 MG CR tablet Take 500 mg by mouth at bedtime.      . prazosin (MINIPRESS) 1 MG capsule Take 1 mg by mouth 2 (two) times daily.      . saxagliptin HCl (ONGLYZA) 5 MG TABS tablet Take 5 mg by mouth daily.      . valsartan (DIOVAN) 320 MG tablet Take 320 mg by mouth daily.        [name] spironolactone (ALDACTONE) 25 MG tablet Take 1 tablet (25 mg total) by mouth daily.  30 tablet  6  . torsemide (DEMADEX) 20 MG tablet Take 2 tablets (40 mg total) by mouth 2 (two) times daily.  120 tablet  6    Past Medical History  Diagnosis Date  . Hypertension   . Morbid obesity   . Atrial flutter  10/01/2006, 12/30/11  . Type II or unspecified type diabetes mellitus without mention of complication, not stated as uncontrolled   . OSA (obstructive sleep apnea)     compliant with CPAP  . Nonischemic cardiomyopathy     ejection fraction 30-35%,   . Chronic systolic dysfunction of left ventricle   . Unspecified disorder of kidney and ureter   . RBBB, anterior fascicular block and incomplete  LBBB     Past Surgical History  Procedure Date  . None     History   Social History  . Marital Status: Married    Spouse Name: N/A    Number of Children: N/A  . Years of Education: N/A   Occupational History  . DISABLED    Social History Main Topics  . Smoking status: Never Smoker   . Smokeless tobacco: Never Used  . Alcohol Use: No  . Drug Use: No  . Sexually Active: Not on file   Other Topics Concern  . Not on file   Social History Narrative   Pt lives in [place] alone.  Disabled.  Previously worked in Designer, fashion/clothing and drove a truck.    Family History  Problem Relation Age of Onset  . Asthma Father 58    DECEASED  . Diabetes      ROS: no nausea, vomiting; no fever, chills; no melena, hematochezia; no claudication  PHYSICAL EXAM: BP 136/75  Pulse 79  Ht 5\' 11"  (1.803 m)  Wt 301 lb (136.533 kg)  BMI 41.98 kg/m2 GENERAL:  51 year old,  morbidly obese, sitting upright; NAD HEENT: NCAT, PERRLA, EOMI; sclera clear; no xanthelasma NECK: palpable bilateral carotid pulses, no bruits; no JVD; no TM LUNGS: CTA bilaterally CARDIAC: RRR (S1, S2);  short, 2-3/4 systolic ejection murmur at base; no rubs or gallops ABDOMEN: soft, non-tender; intact BS EXTREMETIES: 3-4+ bilateral pitting peripheral edema;bilateral erythema  SKIN: warm/dry; no obvious rash/lesions MUSCULOSKELETAL: no joint deformity NEURO: no focal deficit; NL affect   EKG: reviewed and available in Electronic Records   ASSESSMENT & PLAN:  Atrial flutter Maintaining NSR, status post recent RF ablation, 7/19, by Dr. Johney Frame. Patient was taken off Pradaxa, per Dr. Johney Frame. He is to followup with him next month, as scheduled.  Volume overload Patient has massive volume overload and, in fact, has actually gained 11 pounds since last OV, despite having Lasix increased to 3 times a day dosing regimen. I feel that this is his main problem, and that he actually is not in decompensated heart failure. He also informs me today that he has been on Lasix for approximately 20 years. I suspect that he has become less responsive to this medication, and therefore recommend substituting with Demadex 40 mg twice a day. We'll also place him back on Aldactone 25 mg daily, which I recommended when I last saw him in May. Recent followup labs yielded normal renal function, potassium 3.7. Will check labs today with CMET/BNP level, and repeat labs in one week. Will arrange for early clinic followup with me in 2 weeks.  Lower extremity edema Patient has long-standing, chronic bilateral LE edema. I not that he is on Norvasc, which he states he has been on for several years. I will discontinue this medication, given its increased risk for peripheral edema, and utilized other medications for management of his cardiomyopathy and HTN.  HLD (hyperlipidemia) Will reassess lipid status with a FLP. Patient  remains on low-dose Lipitor, and has DM, but no known CAD.[name] Recommended target LDL of 100 or less, if feasible.    Gene Reizy Dunlow, PAC

## 2012-03-09 NOTE — Assessment & Plan Note (Signed)
Patient has long-standing, chronic bilateral LE edema. I not that he is on Norvasc, which he states he has been on for several years. I will discontinue this medication, given its increased risk for peripheral edema, and utilized other medications for management of his cardiomyopathy and HTN.

## 2012-03-09 NOTE — Assessment & Plan Note (Signed)
Will reassess lipid status with a FLP. Patient remains on low-dose Lipitor, and has DM, but no known CAD.[name] Recommended target LDL of 100 or less, if feasible.

## 2012-03-09 NOTE — Patient Instructions (Signed)
   Stop Lasix  Begin Demadex (Torsemide) 40mg  twice a day    Begin Aldactone (Spironolactone) 25mg  daily  Stop Norvasc  Labs:  TODAY - CMET & BNP   Labs:  Do in 7-10 days - fasting lipid panel, BMET, & BNP  No potassium   Office will contact with results Follow up in  2 weeks

## 2012-03-09 NOTE — Assessment & Plan Note (Signed)
Maintaining NSR, status post recent RF ablation, 7/19, by Dr. Johney Frame. Patient was taken off Pradaxa, per Dr. Johney Frame. He is to followup with him next month, as scheduled.

## 2012-03-09 NOTE — Assessment & Plan Note (Signed)
Patient has massive volume overload and, in fact, has actually gained 11 pounds since last OV, despite having Lasix increased to 3 times a day dosing regimen. I feel that this is his main problem, and that he actually is not in decompensated heart failure. He also informs me today that he has been on Lasix for approximately 20 years. I suspect that he has become less responsive to this medication, and therefore recommend substituting with Demadex 40 mg twice a day. We'll also place him back on Aldactone 25 mg daily, which I recommended when I last saw him in May. Recent followup labs yielded normal renal function, potassium 3.7. Will check labs today with CMET/BNP level, and repeat labs in one week. Will arrange for early clinic followup with me in 2 weeks.

## 2012-03-10 ENCOUNTER — Telehealth: Payer: Self-pay | Admitting: *Deleted

## 2012-03-10 NOTE — Telephone Encounter (Signed)
Message copied by Lesle Chris on Thu Mar 10, 2012  1:39 PM ------      Message from: Prescott Parma C      Created: Thu Mar 10, 2012 12:23 PM       Labs reviewed, stable. Continue current medication regimen, pending repeat labs in 1 week.

## 2012-03-10 NOTE — Telephone Encounter (Signed)
Notes Recorded by Lesle Chris, LPN on 5/78/4696 at 1:38 PM Patient notified via voice mail.

## 2012-03-18 ENCOUNTER — Telehealth: Payer: Self-pay | Admitting: *Deleted

## 2012-03-18 NOTE — Telephone Encounter (Signed)
Message copied by Lesle Chris on Fri Mar 18, 2012  2:29 PM ------      Message from: Rande Brunt      Created: Fri Mar 18, 2012 12:50 PM       Renal fxn stable, but BNP up from 250 to 470. Will reassess clinical status during early f/u visit. LDL 77, continue current dose statin.

## 2012-03-18 NOTE — Telephone Encounter (Signed)
Notes Recorded by Lesle Chris, LPN on 10/17/4399 at 2:28 PM Patient notified and verbalized understanding. Already has follow up scheduled with Gene for 8/6. Notes Recorded by Lesle Chris, LPN on 0/09/7251 at 1:14 PM Left message to return call.

## 2012-03-23 ENCOUNTER — Encounter: Payer: Self-pay | Admitting: Physician Assistant

## 2012-03-23 ENCOUNTER — Ambulatory Visit (INDEPENDENT_AMBULATORY_CARE_PROVIDER_SITE_OTHER): Payer: Medicare Other | Admitting: Physician Assistant

## 2012-03-23 VITALS — BP 130/82 | HR 73 | Ht 71.0 in | Wt 322.8 lb

## 2012-03-23 DIAGNOSIS — R0602 Shortness of breath: Secondary | ICD-10-CM

## 2012-03-23 DIAGNOSIS — I4891 Unspecified atrial fibrillation: Secondary | ICD-10-CM

## 2012-03-23 DIAGNOSIS — I4892 Unspecified atrial flutter: Secondary | ICD-10-CM

## 2012-03-23 DIAGNOSIS — I428 Other cardiomyopathies: Secondary | ICD-10-CM

## 2012-03-23 DIAGNOSIS — R609 Edema, unspecified: Secondary | ICD-10-CM

## 2012-03-23 DIAGNOSIS — R601 Generalized edema: Secondary | ICD-10-CM

## 2012-03-23 DIAGNOSIS — I429 Cardiomyopathy, unspecified: Secondary | ICD-10-CM

## 2012-03-23 DIAGNOSIS — I10 Essential (primary) hypertension: Secondary | ICD-10-CM

## 2012-03-23 MED ORDER — TORSEMIDE 20 MG PO TABS: 60.0000 mg | ORAL_TABLET | Freq: Two times a day (BID) | ORAL | Status: DC

## 2012-03-23 MED ORDER — METOLAZONE 5 MG PO TABS: 5.0000 mg | ORAL_TABLET | Freq: Every day | ORAL | Status: DC

## 2012-03-23 NOTE — Patient Instructions (Addendum)
   Increase Demadex to 60mg  twice a day  - start the evening  Begin Zaroxolyn 5mg  daily - first dose in the morning  Labs:  BMET, BNP - do on Friday, 8/9 - morning   Decrease fluid intake  No added salt  Elevate legs when sitting Follow up on Friday, 8/9

## 2012-03-23 NOTE — Assessment & Plan Note (Signed)
After conferring with Dr. Myrtis Ser, recommendation is as follows: We'll increase Demadex to 60 mg twice a day, and add Zaroxolyn 5 mg every morning. Followup BMET/BNP profile Friday morning, prior to return OV with me. Patient once again advised to significantly limit his overall fluid intake, refrain from added salt, and elevate his legs up when seated. Of note, if we are unable to significantly reduce his market volume overload on an outpatient basis, then we will need to hospitalize him for aggressive diuretic management. The patient was in agreement with this plan.

## 2012-03-23 NOTE — Assessment & Plan Note (Signed)
Maintaining NSR by EKG, following recent RFA procedure. Patient to followup with Dr. Johney Frame on August 21, as scheduled.

## 2012-03-23 NOTE — Progress Notes (Addendum)
Primary Cardiologist: Lewayne Bunting, MD   HPI: Patient presents for early scheduled followup. When last seen, I substituted Lasix with Demadex for improved diuretic response, given that patient had actually gained 11 pounds since previous OV, and had been on Lasix for approximately 20 years.  Followup labs stable, with BUN 17, creatinine 0.9, and potassium 3.9. BNP, however, trended upward from 250 to 470, when last checked on August 1.  Clinically, unfortunately, patient continues to gain weight, now presenting with an additional 21 pounds (32 pounds since July 1). He is taking Demadex and reporting good diuretic response. However, he continues to experience progressive LE edema, SOB, and worsening orthopnea. He has been sleeping in a chair for several months now, even before his recent RFA procedure. He denies PND, CP, or tachycardia palpitations.  12-lead EKG today, reviewed by me, demonstrates continued NSR, following recent RFA procedure.  No Known Allergies  Current Outpatient Prescriptions  Medication Sig Dispense Refill  . allopurinol (ZYLOPRIM) 300 MG tablet Take 300 mg by mouth daily.      [name] atorvastatin (LIPITOR) 10 MG tablet Take 10 mg by mouth daily.        . cloNIDine (CATAPRES) 0.2 MG tablet Take 0.2 mg by mouth 2 (two) times daily.        [name] COLCRYS 0.6 MG tablet Take 0.6 mg by mouth as needed.       . digoxin (LANOXIN) 0.25 MG tablet TAKE ONE-HALF (1/2) TABLET ONCE DAILY  15 tablet  3  . gemfibrozil (LOPID) 600 MG tablet Take 600 mg by mouth 2 (two) times daily before a meal.        . glimepiride (AMARYL) 2 MG tablet Take 2 mg by mouth daily before supper.       . glyBURIDE-metformin (GLUCOVANCE) 5-500 MG per tablet Take 2 tablets by mouth 2 (two) times daily with a meal.        . HUMALOG 100 UNIT/ML injection Inject 20 Units into the skin 2 (two) times daily.       . insulin aspart (NOVOLOG) 100 UNIT/ML injection Inject 10 Units into the skin 2 (two) times daily before a meal.        . insulin detemir (LEVEMIR) 100 UNIT/ML injection Inject 62 Units into the skin 2 (two) times daily.      . metoprolol (LOPRESSOR) 100 MG tablet Take 100 mg by mouth 2 (two) times daily.        . niacin (NIASPAN) 500 MG CR tablet Take 500 mg by mouth at bedtime.      . prazosin (MINIPRESS) 1 MG capsule Take 1 mg by mouth 2 (two) times daily.      . saxagliptin HCl (ONGLYZA) 5 MG TABS tablet Take 5 mg by mouth daily.      [name] spironolactone (ALDACTONE) 25 MG tablet Take 1 tablet (25 mg total) by mouth daily.  30 tablet  6  . torsemide (DEMADEX) 20 MG tablet Take 3 tablets (60 mg total) by mouth 2 (two) times daily.  180 tablet  6  . valsartan (DIOVAN) 320 MG tablet Take 320 mg by mouth daily.        [name] DISCONTD: torsemide (DEMADEX) 20 MG tablet Take 2 tablets (40 mg total) by mouth 2 (two) times daily.  120 tablet  6  . metolazone (ZAROXOLYN) 5 MG tablet Take 1 tablet (5 mg total) by mouth daily.  30 tablet  6    Past Medical History  Diagnosis Date  .  Hypertension   . Morbid obesity   . Atrial flutter  10/01/2006, 12/30/11    Status post RF ablation, 7/13, Dr. Johney Frame  . Type II or unspecified type diabetes mellitus without mention of complication, not stated as uncontrolled   . OSA (obstructive sleep apnea)     compliant with CPAP  . Nonischemic cardiomyopathy     Normal coronary arteries, 2008  . Chronic systolic dysfunction of left ventricle     EF 20-25%, global HK; mild RVD, 2-D echo, 5/13  . Unspecified disorder of kidney and ureter   . RBBB, anterior fascicular block and incomplete LBBB   . Pulmonary hypertension     RVSP 50-55 mmHg, 5/13  . Valvular heart disease     Mild MR/TR, 2-D echo, 5/13    Past Surgical History  Procedure Date  . None     History   Social History  . Marital Status: Married    Spouse Name: N/A    Number of Children: N/A  . Years of Education: N/A   Occupational History  . DISABLED    Social History Main Topics  . Smoking status: Never  Smoker   . Smokeless tobacco: Never Used  . Alcohol Use: No  . Drug Use: No  . Sexually Active: Not on file   Other Topics Concern  . Not on file   Social History Narrative   Pt lives in [place] alone.  Disabled.  Previously worked in Designer, fashion/clothing and drove a truck.    Family History  Problem Relation Age of Onset  . Asthma Father 82    DECEASED  . Diabetes      ROS: no nausea, vomiting; no fever, chills; no melena, hematochezia; no claudication  PHYSICAL EXAM: BP 130/82  Pulse 73  Ht 5\' 11"  (1.803 m)  Wt 322 lb 12.8 oz (146.421 kg)  BMI 45.02 kg/m2  SpO2 98% GENERAL: 51 year old, morbidly obese, sitting upright; NAD  HEENT: NCAT, PERRLA, EOMI; sclera clear; no xanthelasma  NECK: palpable bilateral carotid pulses, no bruits; no JVD; no TM  LUNGS: CTA bilaterally  CARDIAC: RRR (S1, S2); short, 2-3/4 systolic ejection murmur at base; no rubs or gallops  ABDOMEN: Protuberant, diffuse pitting edema  EXTREMETIES: 3-4+ bilateral pitting peripheral edema above the knees;bilateral LE erythema; CVI changes  SKIN: warm/dry; no obvious rash/lesions  MUSCULOSKELETAL: no joint deformity  NEURO: no focal deficit; NL affect    EKG: reviewed and available in Electronic Records   ASSESSMENT & PLAN:  Volume overload After conferring with Dr. Myrtis Ser, recommendation is as follows: We'll increase Demadex to 60 mg twice a day, and add Zaroxolyn 5 mg every morning. Followup BMET/BNP profile Friday morning, prior to return OV with me. Patient once again advised to significantly limit his overall fluid intake, refrain from added salt, and elevate his legs up when seated. Of note, if we are unable to significantly reduce his market volume overload on an outpatient basis, then we will need to hospitalize him for aggressive diuretic management. The patient was in agreement with this plan.  Atrial flutter Maintaining NSR by EKG, following recent RFA procedure. Patient to followup with Dr. Johney Frame on  August 21, as scheduled.    Gene Lion Fernandez, PAC I discussed the case completely with Mr.Joziah Dollins. I examined the patient. I'm hopeful that we can obtain significant diuresis with a higher dose of diuretics. Jerral Bonito, MD

## 2012-03-25 ENCOUNTER — Ambulatory Visit (INDEPENDENT_AMBULATORY_CARE_PROVIDER_SITE_OTHER): Payer: Medicare Other | Admitting: Physician Assistant

## 2012-03-25 VITALS — BP 130/79 | HR 84 | Ht 71.0 in | Wt 313.0 lb

## 2012-03-25 DIAGNOSIS — I4891 Unspecified atrial fibrillation: Secondary | ICD-10-CM

## 2012-03-25 DIAGNOSIS — R601 Generalized edema: Secondary | ICD-10-CM

## 2012-03-25 DIAGNOSIS — R609 Edema, unspecified: Secondary | ICD-10-CM

## 2012-03-25 DIAGNOSIS — R0602 Shortness of breath: Secondary | ICD-10-CM

## 2012-03-25 NOTE — Progress Notes (Signed)
Primary Cardiologist: Lewayne Bunting, MD   HPI: Patient presents for 48-hour followup for continued close monitoring/management of anasarca/marked volume overload. Despite recent substitution of Lasix with Demadex, patient has continued to gain weight with a total of 32 pounds since July 1. At last OV, we added Zaroxolyn 5 mg daily and increased Demadex to 60 mg twice a day.   Followup labs: Potassium 4.0, BUN 17, creatinine 1.1 (0.9). BNP up slightly to 550 (470)  Clinically, patient reports significant improvement in energy and baseline DOE. He slept well last night, reporting no orthopnea. He has noted decreased abdominal girth and LE edema. He has diuresed 9 pounds, since last OV.    No Known Allergies  Current Outpatient Prescriptions  Medication Sig Dispense Refill  . allopurinol (ZYLOPRIM) 300 MG tablet Take 300 mg by mouth daily.      [name] atorvastatin (LIPITOR) 10 MG tablet Take 10 mg by mouth daily.        . cloNIDine (CATAPRES) 0.2 MG tablet Take 0.2 mg by mouth 2 (two) times daily.        [name] COLCRYS 0.6 MG tablet Take 0.6 mg by mouth as needed.       . digoxin (LANOXIN) 0.25 MG tablet TAKE ONE-HALF (1/2) TABLET ONCE DAILY  15 tablet  3  . gemfibrozil (LOPID) 600 MG tablet Take 600 mg by mouth 2 (two) times daily before a meal.        . glimepiride (AMARYL) 2 MG tablet Take 2 mg by mouth daily before supper.       . glyBURIDE-metformin (GLUCOVANCE) 5-500 MG per tablet Take 2 tablets by mouth 2 (two) times daily with a meal.        . HUMALOG 100 UNIT/ML injection Inject 20 Units into the skin 2 (two) times daily.       . insulin aspart (NOVOLOG) 100 UNIT/ML injection Inject 10 Units into the skin 2 (two) times daily before a meal.       . insulin detemir (LEVEMIR) 100 UNIT/ML injection Inject 62 Units into the skin 2 (two) times daily.      . metolazone (ZAROXOLYN) 5 MG tablet Take 1 tablet (5 mg total) by mouth daily.  30 tablet  6  . metoprolol (LOPRESSOR) 100 MG tablet Take 100 mg by  mouth 2 (two) times daily.        . niacin (NIASPAN) 500 MG CR tablet Take 500 mg by mouth at bedtime.      . prazosin (MINIPRESS) 1 MG capsule Take 1 mg by mouth 2 (two) times daily.      . saxagliptin HCl (ONGLYZA) 5 MG TABS tablet Take 5 mg by mouth daily.      [name] spironolactone (ALDACTONE) 25 MG tablet Take 1 tablet (25 mg total) by mouth daily.  30 tablet  6  . torsemide (DEMADEX) 20 MG tablet Take 3 tablets (60 mg total) by mouth 2 (two) times daily.  180 tablet  6  . valsartan (DIOVAN) 320 MG tablet Take 320 mg by mouth daily.          Past Medical History  Diagnosis Date  . Hypertension   . Morbid obesity   . Atrial flutter  10/01/2006, 12/30/11    Status post RF ablation, 7/13, Dr. Johney Frame  . Type II or unspecified type diabetes mellitus without mention of complication, not stated as uncontrolled   . OSA (obstructive sleep apnea)     compliant with CPAP  . Nonischemic cardiomyopathy  Normal coronary arteries, 2008  . Chronic systolic dysfunction of left ventricle     EF 20-25%, global HK; mild RVD, 2-D echo, 5/13  . Unspecified disorder of kidney and ureter   . RBBB, anterior fascicular block and incomplete LBBB   . Pulmonary hypertension     RVSP 50-55 mmHg, 5/13  . Valvular heart disease     Mild MR/TR, 2-D echo, 5/13    Past Surgical History  Procedure Date  . None     History   Social History  . Marital Status: Married    Spouse Name: N/A    Number of Children: N/A  . Years of Education: N/A   Occupational History  . DISABLED    Social History Main Topics  . Smoking status: Never Smoker   . Smokeless tobacco: Never Used  . Alcohol Use: No  . Drug Use: No  . Sexually Active: Not on file   Other Topics Concern  . Not on file   Social History Narrative   Pt lives in [place] alone.  Disabled.  Previously worked in Designer, fashion/clothing and drove a truck.    Family History  Problem Relation Age of Onset  . Asthma Father 55    DECEASED  . Diabetes       ROS: no nausea, vomiting; no fever, chills; no melena, hematochezia; no claudication  PHYSICAL EXAM: BP 130/79  Pulse 84  Ht 5\' 11"  (1.803 m)  Wt 313 lb (141.976 kg)  BMI 43.65 kg/m2  SpO2 96% GENERAL: 51 year old, morbidly obese, sitting upright; NAD  HEENT: NCAT, PERRLA, EOMI; sclera clear; no xanthelasma  NECK: palpable bilateral carotid pulses, no bruits; no JVD; no TM  LUNGS: CTA bilaterally  CARDIAC: RRR (S1, S2); short, 2-3/4 systolic ejection murmur at base; no rubs or gallops  ABDOMEN: Protuberant, diffuse pitting edema  EXTREMETIES: 3-4+ bilateral pitting peripheral edema above the knees;bilateral LE erythema; CVI changes  SKIN: warm/dry; no obvious rash/lesions  MUSCULOSKELETAL: no joint deformity  NEURO: no focal deficit; NL affect    EKG: reviewed and available in Electronic Records   ASSESSMENT & PLAN:  Anasarca Following review Dr. Myrtis Ser, recommendation is as follows: Continue Demadex at recent increased dosing regimen of 60 twice a day. However, will discontinue Zaroxolyn, so as to avoid marked overdiuresis (9 pounds/48 hrs). We'll plan early followup next week, again with a BMET/BNP level on morning of scheduled OV.    Gene Jasilyn Holderman, PAC

## 2012-03-25 NOTE — Patient Instructions (Addendum)
Your physician recommends that you schedule a follow-up appointment next week with Dr. Myrtis Ser. Your physician has recommended you make the following change in your medication: Stop metolazone(zaroxolyn). All other medications will remain the same. Your physician recommends that you return for lab work on the morning of your appointment that will be scheduled for next week at [facility].

## 2012-03-25 NOTE — Assessment & Plan Note (Signed)
Following review Dr. Myrtis Ser, recommendation is as follows: Continue Demadex at recent increased dosing regimen of 60 twice a day. However, will discontinue Zaroxolyn, so as to avoid marked overdiuresis (9 pounds/48 hrs). We'll plan early followup next week, again with a BMET/BNP level on morning of scheduled OV.

## 2012-04-01 ENCOUNTER — Encounter: Payer: Self-pay | Admitting: Physician Assistant

## 2012-04-01 ENCOUNTER — Ambulatory Visit (INDEPENDENT_AMBULATORY_CARE_PROVIDER_SITE_OTHER): Payer: Medicare Other | Admitting: Physician Assistant

## 2012-04-01 VITALS — BP 135/75 | HR 81 | Ht 71.0 in | Wt 293.8 lb

## 2012-04-01 DIAGNOSIS — R0602 Shortness of breath: Secondary | ICD-10-CM

## 2012-04-01 DIAGNOSIS — I509 Heart failure, unspecified: Secondary | ICD-10-CM

## 2012-04-01 DIAGNOSIS — R601 Generalized edema: Secondary | ICD-10-CM

## 2012-04-01 DIAGNOSIS — R609 Edema, unspecified: Secondary | ICD-10-CM

## 2012-04-01 NOTE — Assessment & Plan Note (Signed)
Patient continues to diuresis very nicely with recent addition of Demadex (substituted for long-standing Lasix therapy), having now diuresed 29 pounds from his peak of 322 pounds, on August 7. We'll continue this current diuretic regimen, which includes Aldactone, and repeat BMET/BNP level in 2 weeks. We'll plan return clinic followup with myself/Dr. Degent in one month, for continued close monitoring and management.

## 2012-04-01 NOTE — Progress Notes (Signed)
Primary Cardiologist: Lewayne Bunting, MD   HPI: Presents for early scheduled followup. When last seen, we recommended continuing Demadex at 60 twice a day and DC'ing Zaroxolyn, after only 2 days on the latter. This was following his excellent diuretic response with the addition of Zaroxolyn, presented with a 9 pound weight loss over the preceding 48 hours.   Followup labs, this a.m.: Potassium 4.2, BUN 27, creatinine 1.0; BNP 630 (550)  Since his last OV one week ago, he continues to report significant overall improvement. He has now lost an additional 20 pounds. He reports increased energy, with much less DOE, continued improvement of peripheral edema, and no orthopnea or PND. He continues to weigh himself daily, and refrains from added salt in his diet.  No Known Allergies  Current Outpatient Prescriptions  Medication Sig Dispense Refill  . allopurinol (ZYLOPRIM) 300 MG tablet Take 300 mg by mouth daily.      [name] atorvastatin (LIPITOR) 10 MG tablet Take 10 mg by mouth daily.        . cloNIDine (CATAPRES) 0.2 MG tablet Take 0.2 mg by mouth 2 (two) times daily.        [name] COLCRYS 0.6 MG tablet Take 0.6 mg by mouth as needed.       . digoxin (LANOXIN) 0.25 MG tablet TAKE ONE-HALF (1/2) TABLET ONCE DAILY  15 tablet  3  . gemfibrozil (LOPID) 600 MG tablet Take 600 mg by mouth 2 (two) times daily before a meal.        . glimepiride (AMARYL) 2 MG tablet Take 2 mg by mouth daily before supper.       . glyBURIDE-metformin (GLUCOVANCE) 5-500 MG per tablet Take 2 tablets by mouth 2 (two) times daily with a meal.        . HUMALOG 100 UNIT/ML injection Inject 20 Units into the skin 2 (two) times daily.       . insulin aspart (NOVOLOG) 100 UNIT/ML injection Inject 10 Units into the skin 2 (two) times daily before a meal.       . insulin detemir (LEVEMIR) 100 UNIT/ML injection Inject 62 Units into the skin 2 (two) times daily.      . metoprolol (LOPRESSOR) 100 MG tablet Take 100 mg by mouth 2 (two) times daily.         . niacin (NIASPAN) 500 MG CR tablet Take 500 mg by mouth at bedtime.      . prazosin (MINIPRESS) 1 MG capsule Take 1 mg by mouth 2 (two) times daily.      . saxagliptin HCl (ONGLYZA) 5 MG TABS tablet Take 5 mg by mouth daily.      [name] torsemide (DEMADEX) 20 MG tablet Take 3 tablets (60 mg total) by mouth 2 (two) times daily.  180 tablet  6  . valsartan (DIOVAN) 320 MG tablet Take 320 mg by mouth daily.        [name] spironolactone (ALDACTONE) 25 MG tablet Take 1 tablet (25 mg total) by mouth daily.  30 tablet  6    Past Medical History  Diagnosis Date  . Hypertension   . Morbid obesity   . Atrial flutter  10/01/2006, 12/30/11    Status post RF ablation, 7/13, Dr. Johney Frame  . Type II or unspecified type diabetes mellitus without mention of complication, not stated as uncontrolled   . OSA (obstructive sleep apnea)     compliant with CPAP  . Nonischemic cardiomyopathy     Normal coronary arteries, 2008  .  Chronic systolic dysfunction of left ventricle     EF 20-25%, global HK; mild RVD, 2-D echo, 5/13  . Unspecified disorder of kidney and ureter   . RBBB, anterior fascicular block and incomplete LBBB   . Pulmonary hypertension     RVSP 50-55 mmHg, 5/13  . Valvular heart disease     Mild MR/TR, 2-D echo, 5/13    Past Surgical History  Procedure Date  . None     History   Social History  . Marital Status: Married    Spouse Name: N/A    Number of Children: N/A  . Years of Education: N/A   Occupational History  . DISABLED    Social History Main Topics  . Smoking status: Never Smoker   . Smokeless tobacco: Never Used  . Alcohol Use: No  . Drug Use: No  . Sexually Active: Not on file   Other Topics Concern  . Not on file   Social History Narrative   Pt lives in [place] alone.  Disabled.  Previously worked in Designer, fashion/clothing and drove a truck.    Family History  Problem Relation Age of Onset  . Asthma Father 67    DECEASED  . Diabetes      ROS: no nausea, vomiting; no  fever, chills; no melena, hematochezia; no claudication  PHYSICAL EXAM: BP 135/75  Pulse 81  Ht 5\' 11"  (1.803 m)  Wt 293 lb 12.8 oz (133.267 kg)  BMI 40.98 kg/m2  SpO2 96% GENERAL: 51 year old, morbidly obese, sitting upright; NAD  HEENT: NCAT, PERRLA, EOMI; sclera clear; no xanthelasma  NECK: palpable bilateral carotid pulses, no bruits; no JVD; no TM  LUNGS: CTA bilaterally  CARDIAC: RRR (S1, S2); short, 2-3/4 systolic ejection murmur at base; no rubs or gallops  ABDOMEN: Protuberant  EXTREMETIES: Trace-1+ bilateral peripheral edema above the knees;bilateral LE erythema; CVI changes  SKIN: warm/dry; no obvious rash/lesions  MUSCULOSKELETAL: no joint deformity  NEURO: no focal deficit; NL affect    EKG:    ASSESSMENT & PLAN:  Anasarca Patient continues to diuresis very nicely with recent addition of Demadex (substituted for long-standing Lasix therapy), having now diuresed 29 pounds from his peak of 322 pounds, on August 7. We'll continue this current diuretic regimen, which includes Aldactone, and repeat BMET/BNP level in 2 weeks. We'll plan return clinic followup with myself/Dr. Degent in one month, for continued close monitoring and management.    Gene Fredrica Capano, PAC

## 2012-04-01 NOTE — Patient Instructions (Addendum)
Your physician recommends that you schedule a follow-up appointment in: 1 month. Your physician recommends that you continue on your current medications as directed. Please refer to the Current Medication list given to you today.  Your physician recommends that you return for lab work in: around 04/15/12 at [facility].

## 2012-04-05 ENCOUNTER — Telehealth: Payer: Self-pay | Admitting: *Deleted

## 2012-04-05 NOTE — Telephone Encounter (Signed)
Notes Recorded by Prescott Parma, PA on 04/04/2012 at 11:00 AM Stable labs. Will repeat labs at next visit. Continue current medication regimen.   Patient notified via voice mail.

## 2012-04-05 NOTE — Telephone Encounter (Signed)
Patient informed. 

## 2012-04-05 NOTE — Telephone Encounter (Signed)
Message copied by Eustace Moore on Tue Apr 05, 2012  3:21 PM ------      Message from: Eustace Moore      Created: Mon Apr 04, 2012  1:19 PM                   ----- Message -----         From: Prescott Parma, PA         Sent: 04/04/2012  11:00 AM           To: Lesle Chris, LPN            Stable labs. Will repeat labs at next visit. Continue current medication regimen.

## 2012-04-06 ENCOUNTER — Ambulatory Visit (INDEPENDENT_AMBULATORY_CARE_PROVIDER_SITE_OTHER): Payer: Medicare Other | Admitting: Internal Medicine

## 2012-04-06 ENCOUNTER — Encounter: Payer: Self-pay | Admitting: Internal Medicine

## 2012-04-06 VITALS — BP 145/82 | HR 91 | Ht 71.0 in | Wt 292.8 lb

## 2012-04-06 DIAGNOSIS — I5023 Acute on chronic systolic (congestive) heart failure: Secondary | ICD-10-CM

## 2012-04-06 DIAGNOSIS — I4892 Unspecified atrial flutter: Secondary | ICD-10-CM

## 2012-04-06 NOTE — Assessment & Plan Note (Signed)
Continues to slowly improve with diuresis He has scheduled follow-up with Dr Andee Lineman Nira Retort Serpe   He will need to have repeat echo 3 months post ablation.  IF his EF remains <35% then he could be considered for ICD implantation at that point. I would be happy to see him again in consultation should that be the case.

## 2012-04-06 NOTE — Assessment & Plan Note (Signed)
Doing well s/p ablation He is now off of anticoagulation  If he develops afib in the future, then he would again require anticoagulation

## 2012-04-06 NOTE — Assessment & Plan Note (Signed)
Weight loss advised  Follow-up with Dr Andee Lineman  I will see as needed

## 2012-04-06 NOTE — Patient Instructions (Addendum)
Your physician recommends that you schedule a follow-up appointment in: as needed with Dr Allred  Your physician recommends that you continue on your current medications as directed. Please refer to the Current Medication list given to you today.   

## 2012-04-06 NOTE — Progress Notes (Signed)
PCP: Kirstie Peri, MD Primary Cardiologist:  Dr Lucia Gaskins [name] is a 51 y.o. male who presents today for electrophysiology followup.  Since his recent atrial flutter ablation, he has had no symptoms of arrhythmia.  He has had progressive volume overload and has required aggressive diuresis under the direction of Dr Myrtis Ser.  He has now returned to his baseline weight.  He reports that his breathing is "much better".  Edema continues to improve.   Today, he denies symptoms of palpitations, chest pain, dizziness, presyncope, or syncope.  The patient is otherwise without complaint today.   Past Medical History  Diagnosis Date  . Hypertension   . Morbid obesity   . Atrial flutter  10/01/2006, 12/30/11    Status post RF ablation, 7/13, Dr. Johney Frame  . Type II or unspecified type diabetes mellitus without mention of complication, not stated as uncontrolled   . OSA (obstructive sleep apnea)     compliant with CPAP  . Nonischemic cardiomyopathy     Normal coronary arteries, 2008  . Chronic systolic dysfunction of left ventricle     EF 20-25%, global HK; mild RVD, 2-D echo, 5/13  . Unspecified disorder of kidney and ureter   . RBBB, anterior fascicular block and incomplete LBBB   . Pulmonary hypertension     RVSP 50-55 mmHg, 5/13  . Valvular heart disease     Mild MR/TR, 2-D echo, 5/13   Past Surgical History  Procedure Date  . None     Current Outpatient Prescriptions  Medication Sig Dispense Refill  . allopurinol (ZYLOPRIM) 300 MG tablet Take 300 mg by mouth daily.      [name] atorvastatin (LIPITOR) 10 MG tablet Take 10 mg by mouth daily.        . cloNIDine (CATAPRES) 0.2 MG tablet Take 0.2 mg by mouth 2 (two) times daily.        [name] COLCRYS 0.6 MG tablet Take 0.6 mg by mouth as needed.       . digoxin (LANOXIN) 0.25 MG tablet TAKE ONE-HALF (1/2) TABLET ONCE DAILY  15 tablet  3  . gemfibrozil (LOPID) 600 MG tablet Take 600 mg by mouth 2 (two) times daily before a meal.        . glimepiride  (AMARYL) 2 MG tablet Take 2 mg by mouth daily before supper.       . glyBURIDE-metformin (GLUCOVANCE) 5-500 MG per tablet Take 2 tablets by mouth 2 (two) times daily with a meal.        . HUMALOG 100 UNIT/ML injection Inject 20 Units into the skin 2 (two) times daily.       . insulin aspart (NOVOLOG) 100 UNIT/ML injection Inject 10 Units into the skin 2 (two) times daily before a meal.       . insulin detemir (LEVEMIR) 100 UNIT/ML injection Inject 62 Units into the skin 2 (two) times daily.      . metoprolol (LOPRESSOR) 100 MG tablet Take 100 mg by mouth 2 (two) times daily.        . niacin (NIASPAN) 500 MG CR tablet Take 500 mg by mouth at bedtime.      . prazosin (MINIPRESS) 1 MG capsule Take 1 mg by mouth 2 (two) times daily.      . saxagliptin HCl (ONGLYZA) 5 MG TABS tablet Take 5 mg by mouth daily.      [name] spironolactone (ALDACTONE) 25 MG tablet Take 1 tablet (25 mg total) by mouth daily.  30 tablet  6  . torsemide (DEMADEX) 20 MG tablet Take 3 tablets (60 mg total) by mouth 2 (two) times daily.  180 tablet  6  . valsartan (DIOVAN) 320 MG tablet Take 320 mg by mouth daily.          Physical Exam: Filed Vitals:   04/06/12 0905  BP: 145/82  Pulse: 91  Height: 5\' 11"  (1.803 m)  Weight: 292 lb 12.8 oz (132.813 kg)  SpO2: 95%    GEN- The patient is well appearing, alert and oriented x 3 today.   Head- normocephalic, atraumatic Eyes-  Sclera clear, conjunctiva pink Ears- hearing intact Oropharynx- clear Lungs- Clear to ausculation bilaterally, normal work of breathing Heart- Regular rate and rhythm, no murmurs, rubs or gallops, PMI not laterally displaced GI- soft, NT, ND, + BS Extremities- no clubbing, cyanosis, +2 edema Neuro- strength/sensation are intact  ekg today reveals sinus rhythm 86 bpm, PR 222, IVCD (QRS ), Qtc 473  Assessment and Plan:

## 2012-04-07 NOTE — Addendum Note (Signed)
Addended by: Eustace Moore on: 04/07/2012 10:36 AM   Modules accepted: Orders

## 2012-04-21 ENCOUNTER — Encounter: Payer: Self-pay | Admitting: *Deleted

## 2012-05-04 ENCOUNTER — Ambulatory Visit (INDEPENDENT_AMBULATORY_CARE_PROVIDER_SITE_OTHER): Payer: Medicare Other | Admitting: Physician Assistant

## 2012-05-04 ENCOUNTER — Encounter: Payer: Self-pay | Admitting: Physician Assistant

## 2012-05-04 VITALS — BP 125/80 | HR 70 | Ht 71.0 in | Wt 296.8 lb

## 2012-05-04 DIAGNOSIS — R0602 Shortness of breath: Secondary | ICD-10-CM

## 2012-05-04 DIAGNOSIS — I5022 Chronic systolic (congestive) heart failure: Secondary | ICD-10-CM

## 2012-05-04 DIAGNOSIS — R609 Edema, unspecified: Secondary | ICD-10-CM

## 2012-05-04 DIAGNOSIS — R601 Generalized edema: Secondary | ICD-10-CM

## 2012-05-04 DIAGNOSIS — I4892 Unspecified atrial flutter: Secondary | ICD-10-CM

## 2012-05-04 MED ORDER — METOLAZONE 2.5 MG PO TABS: ORAL_TABLET | ORAL | Status: DC

## 2012-05-04 NOTE — Patient Instructions (Signed)
   Add Zaroxolyn 2.5mg  every Monday, Wednesday, & Friday - to be taken 30 minutes prior to morning dose of Demadex Continue all other current medications.  Lab - today - BMET, BNP  Office will contact with results Follow up in  1 month

## 2012-05-04 NOTE — Progress Notes (Signed)
Primary Cardiologist: Lewayne Bunting, MD   HPI: Patient presents for early scheduled followup.   - Interim labs: Renal function remains stable with BUN 27, creatinine 1.0. Potassium 4.3. BNP level trending downward to 530 (630)  Clinically, patient continues to report overall significant improvement, since we substituted Lasix with Demadex a few months ago. Although he has now gained 3 pounds since his last OV (after having diuresed 29 pounds from a peak of 322 pounds, on August 7), he continues to experience increased level of energy and less DOE. He continues to refrain from dietary sodium, and to limit his total fluid intake.  No Known Allergies  Current Outpatient Prescriptions  Medication Sig Dispense Refill  . allopurinol (ZYLOPRIM) 300 MG tablet Take 300 mg by mouth daily.      [name] atorvastatin (LIPITOR) 10 MG tablet Take 10 mg by mouth daily.        . cloNIDine (CATAPRES) 0.2 MG tablet Take 0.2 mg by mouth 2 (two) times daily.        [name] COLCRYS 0.6 MG tablet Take 0.6 mg by mouth as needed.       . digoxin (LANOXIN) 0.25 MG tablet TAKE ONE-HALF (1/2) TABLET ONCE DAILY  15 tablet  3  . gemfibrozil (LOPID) 600 MG tablet Take 600 mg by mouth 2 (two) times daily before a meal.        . glimepiride (AMARYL) 2 MG tablet Take 2 mg by mouth daily before supper.       . glyBURIDE-metformin (GLUCOVANCE) 5-500 MG per tablet Take 2 tablets by mouth 2 (two) times daily with a meal.        . HUMALOG 100 UNIT/ML injection Inject 20 Units into the skin 2 (two) times daily.       . insulin aspart (NOVOLOG) 100 UNIT/ML injection Inject 10 Units into the skin 2 (two) times daily before a meal.       . insulin detemir (LEVEMIR) 100 UNIT/ML injection Inject 62 Units into the skin 2 (two) times daily.      . metoprolol (LOPRESSOR) 100 MG tablet Take 100 mg by mouth 2 (two) times daily.        . niacin (NIASPAN) 500 MG CR tablet Take 500 mg by mouth at bedtime.      . prazosin (MINIPRESS) 1 MG capsule Take 1 mg  by mouth 2 (two) times daily.      . saxagliptin HCl (ONGLYZA) 5 MG TABS tablet Take 5 mg by mouth daily.      [name] spironolactone (ALDACTONE) 25 MG tablet Take 1 tablet (25 mg total) by mouth daily.  30 tablet  6  . torsemide (DEMADEX) 20 MG tablet Take 3 tablets (60 mg total) by mouth 2 (two) times daily.  180 tablet  6  . valsartan (DIOVAN) 320 MG tablet Take 320 mg by mouth daily.        . metolazone (ZAROXOLYN) 2.5 MG tablet Take one tab by mouth every Monday, Wednesday, and Friday - 30 minutes prior to morning dose of the Demadex  30 tablet  3    Past Medical History  Diagnosis Date  . Hypertension   . Morbid obesity   . Atrial flutter  10/01/2006, 12/30/11    Status post RF ablation, 7/13, Dr. Johney Frame  . Type II or unspecified type diabetes mellitus without mention of complication, not stated as uncontrolled   . OSA (obstructive sleep apnea)     compliant with CPAP  . Nonischemic cardiomyopathy  Normal coronary arteries, 2008  . Chronic systolic dysfunction of left ventricle     EF 20-25%, global HK; mild RVD, 2-D echo, 5/13  . Unspecified disorder of kidney and ureter   . RBBB, anterior fascicular block and incomplete LBBB   . Pulmonary hypertension     RVSP 50-55 mmHg, 5/13  . Valvular heart disease     Mild MR/TR, 2-D echo, 5/13    Past Surgical History  Procedure Date  . None     History   Social History  . Marital Status: Married    Spouse Name: N/A    Number of Children: N/A  . Years of Education: N/A   Occupational History  . DISABLED    Social History Main Topics  . Smoking status: Never Smoker   . Smokeless tobacco: Never Used  . Alcohol Use: No  . Drug Use: No  . Sexually Active: Not on file   Other Topics Concern  . Not on file   Social History Narrative   Pt lives in [place] alone.  Disabled.  Previously worked in Designer, fashion/clothing and drove a truck.    Family History  Problem Relation Age of Onset  . Asthma Father 49    DECEASED  . Diabetes       ROS: no nausea, vomiting; no fever, chills; no melena, hematochezia; no claudication  PHYSICAL EXAM: BP 125/80  Pulse 70  Ht 5\' 11"  (1.803 m)  Wt 296 lb 12.8 oz (134.628 kg)  BMI 41.40 kg/m2  SpO2 98% GENERAL: 51 year old, morbidly obese, sitting upright; NAD  HEENT: NCAT, PERRLA, EOMI; sclera clear; no xanthelasma  NECK: Unable to assess for JVD, secondary to neck girth LUNGS: CTA bilaterally  CARDIAC: RRR (S1, S2); short, 2-3/4 systolic ejection murmur at base; no rubs or gallops  ABDOMEN: Protuberant  EXTREMETIES: Trace-1+ bilateral peripheral edema above the knees; bilateral support hose  SKIN: warm/dry; no obvious rash/lesions  MUSCULOSKELETAL: no joint deformity  NEURO: no focal deficit; NL affect    EKG:    ASSESSMENT & PLAN:  Anasarca Patient has requested that I try to push his diuretic regimen even more, in this aggressive effort to diuresis him. Again, he attained a peak weight of 322 pounds on August 7, and had diuresed a total of 29 pounds, when last seen here on August 16. Also of note, he did not begin to demonstrate net negative diuresis, until I substituted his long-standing Lasix therapy with Demadex, back in late July. Today I will place him on Zaroxolyn 2.5 mg 3 times weekly, to be given 30 minutes prior to a.m. Demadex dose. We will check a followup BMD G./BNP level today, and continue to monitor his potassium level and renal function closely. We'll plan on early clinic followup with me in one month.  Atrial flutter Patient was seen by Dr. Johney Frame for post RF ablation followup on August 21, at which time he remained in NSR. He did note that if the patient were to develop AF in the future, then he would again require anticoagulation. He also recommended a repeat echocardiogram 3 months post ablation, to assess for possible ICD implantation, if EF remained <35%.    Gene Kodi Steil, PAC

## 2012-05-04 NOTE — Assessment & Plan Note (Signed)
Patient has requested that I try to push his diuretic regimen even more, in this aggressive effort to diuresis him. Again, he attained a peak weight of 322 pounds on August 7, and had diuresed a total of 29 pounds, when last seen here on August 16. Also of note, he did not begin to demonstrate net negative diuresis, until I substituted his long-standing Lasix therapy with Demadex, back in late July. Today I will place him on Zaroxolyn 2.5 mg 3 times weekly, to be given 30 minutes prior to a.m. Demadex dose. We will check a followup BMD G./BNP level today, and continue to monitor his potassium level and renal function closely. We'll plan on early clinic followup with me in one month.

## 2012-05-04 NOTE — Assessment & Plan Note (Signed)
Patient was seen by Dr. Johney Frame for post RF ablation followup on August 21, at which time he remained in NSR. He did note that if the patient were to develop AF in the future, then he would again require anticoagulation. He also recommended a repeat echocardiogram 3 months post ablation, to assess for possible ICD implantation, if EF remained <35%.

## 2012-05-06 ENCOUNTER — Telehealth: Payer: Self-pay | Admitting: *Deleted

## 2012-05-06 DIAGNOSIS — R7989 Other specified abnormal findings of blood chemistry: Secondary | ICD-10-CM

## 2012-05-06 DIAGNOSIS — I10 Essential (primary) hypertension: Secondary | ICD-10-CM

## 2012-05-06 DIAGNOSIS — R0602 Shortness of breath: Secondary | ICD-10-CM

## 2012-05-06 DIAGNOSIS — Z79899 Other long term (current) drug therapy: Secondary | ICD-10-CM

## 2012-05-06 NOTE — Telephone Encounter (Signed)
Message copied by Lesle Chris on Fri May 06, 2012  9:19 AM ------      Message from: Prescott Parma C      Created: Thu May 05, 2012  3:38 PM       BNP continues to trend down. Repeat BMET/BNP 1 week

## 2012-05-06 NOTE — Telephone Encounter (Signed)
Notes Recorded by Lesle Chris, LPN on 1/61/0960 at 9:19 AM Patient notified. Will fax order to [facility] now. Will go around 05/13/2012. Notes Recorded by Lesle Chris, LPN on 4/54/0981 at 9:07 AM Left message to return call.

## 2012-05-17 ENCOUNTER — Encounter: Payer: Self-pay | Admitting: *Deleted

## 2012-06-08 ENCOUNTER — Ambulatory Visit (INDEPENDENT_AMBULATORY_CARE_PROVIDER_SITE_OTHER): Payer: Medicare Other | Admitting: Physician Assistant

## 2012-06-08 ENCOUNTER — Encounter: Payer: Self-pay | Admitting: Physician Assistant

## 2012-06-08 VITALS — BP 119/74 | HR 83 | Ht 72.0 in | Wt 285.0 lb

## 2012-06-08 DIAGNOSIS — R609 Edema, unspecified: Secondary | ICD-10-CM

## 2012-06-08 DIAGNOSIS — I5022 Chronic systolic (congestive) heart failure: Secondary | ICD-10-CM

## 2012-06-08 DIAGNOSIS — I429 Cardiomyopathy, unspecified: Secondary | ICD-10-CM

## 2012-06-08 DIAGNOSIS — R0602 Shortness of breath: Secondary | ICD-10-CM

## 2012-06-08 DIAGNOSIS — R601 Generalized edema: Secondary | ICD-10-CM

## 2012-06-08 DIAGNOSIS — I4892 Unspecified atrial flutter: Secondary | ICD-10-CM

## 2012-06-08 NOTE — Assessment & Plan Note (Signed)
Will continue current diuretic regimen, given loss of 11 pounds since last OV. Patient is now at 285 pounds, down from a peak of 322 on August 7. We'll order followup labs for continued close monitoring of electrolytes and renal function. Recent series of labs indicated stable renal function, with creatinine unchanged at 1.3. Will also repeat a BNP level. Will schedule early followup with me in 1 month, after which patient has expressed a desire to continue following with Dr. Andee Lineman.

## 2012-06-08 NOTE — Progress Notes (Signed)
Primary Cardiologist:  HPI: Patient presents for scheduled 1 month followup.  When last seen, September 18, I placed him on Zaroxolyn 2.5 mg 3 times weekly, to be taken 30 minutes prior to a.m. Demadex dose.   - Followup labs, September 30: Potassium 5.1, BUN 48, creatinine 1.3 (1.3). BNP essentially unchanged at 320 (340).  Clinically, he continues to report overall progress in his exertional dyspnea and exercise tolerance. He has now lost an additional 11 pounds, since his last OV. He has had a marked decrease in his peripheral edema, since we initially began treating him for marked volume overload and anasarca. He reports compliance with his medications, including the recent addition of Zaroxolyn, as well as dietary sodium restriction.  No Known Allergies  Current Outpatient Prescriptions  Medication Sig Dispense Refill  . allopurinol (ZYLOPRIM) 300 MG tablet Take 300 mg by mouth daily.      [name] atorvastatin (LIPITOR) 10 MG tablet Take 10 mg by mouth daily.        . cloNIDine (CATAPRES) 0.2 MG tablet Take 0.2 mg by mouth 2 (two) times daily.        [name] COLCRYS 0.6 MG tablet Take 0.6 mg by mouth as needed.       . digoxin (LANOXIN) 0.25 MG tablet TAKE ONE-HALF (1/2) TABLET ONCE DAILY  15 tablet  3  . gemfibrozil (LOPID) 600 MG tablet Take 600 mg by mouth 2 (two) times daily before a meal.        . glimepiride (AMARYL) 2 MG tablet Take 2 mg by mouth daily before supper.       . glyBURIDE-metformin (GLUCOVANCE) 5-500 MG per tablet Take 2 tablets by mouth 2 (two) times daily with a meal.        . HUMALOG 100 UNIT/ML injection Inject 20 Units into the skin 2 (two) times daily.       . insulin aspart (NOVOLOG) 100 UNIT/ML injection Inject 10 Units into the skin 2 (two) times daily before a meal.       . insulin detemir (LEVEMIR) 100 UNIT/ML injection Inject 62 Units into the skin 2 (two) times daily.      . metolazone (ZAROXOLYN) 2.5 MG tablet Take one tab by mouth every Monday, Wednesday, and  Friday - 30 minutes prior to morning dose of the Demadex  30 tablet  3  . metoprolol (LOPRESSOR) 100 MG tablet Take 100 mg by mouth 2 (two) times daily.        . niacin (NIASPAN) 500 MG CR tablet Take 500 mg by mouth at bedtime.      . prazosin (MINIPRESS) 1 MG capsule Take 1 mg by mouth 2 (two) times daily.      . saxagliptin HCl (ONGLYZA) 5 MG TABS tablet Take 5 mg by mouth daily.      [name] spironolactone (ALDACTONE) 25 MG tablet Take 1 tablet (25 mg total) by mouth daily.  30 tablet  6  . torsemide (DEMADEX) 20 MG tablet Take 3 tablets (60 mg total) by mouth 2 (two) times daily.  180 tablet  6  . valsartan (DIOVAN) 320 MG tablet Take 320 mg by mouth daily.          Past Medical History  Diagnosis Date  . Hypertension   . Morbid obesity   . Atrial flutter  10/01/2006, 12/30/11    Status post RF ablation, 7/13, Dr. Johney Frame  . Type II or unspecified type diabetes mellitus without mention of complication, not stated as uncontrolled   .  OSA (obstructive sleep apnea)     compliant with CPAP  . Nonischemic cardiomyopathy     Normal coronary arteries, 2008  . Chronic systolic dysfunction of left ventricle     EF 20-25%, global HK; mild RVD, 2-D echo, 5/13  . Unspecified disorder of kidney and ureter   . RBBB, anterior fascicular block and incomplete LBBB   . Pulmonary hypertension     RVSP 50-55 mmHg, 5/13  . Valvular heart disease     Mild MR/TR, 2-D echo, 5/13    Past Surgical History  Procedure Date  . None     History   Social History  . Marital Status: Married    Spouse Name: N/A    Number of Children: N/A  . Years of Education: N/A   Occupational History  . DISABLED    Social History Main Topics  . Smoking status: Never Smoker   . Smokeless tobacco: Never Used  . Alcohol Use: No  . Drug Use: No  . Sexually Active: Not on file   Other Topics Concern  . Not on file   Social History Narrative   Pt lives in [place] alone.  Disabled.  Previously worked in Designer, fashion/clothing and  drove a truck.    Family History  Problem Relation Age of Onset  . Asthma Father 22    DECEASED  . Diabetes      ROS: no nausea, vomiting; no fever, chills; no melena, hematochezia; no claudication  PHYSICAL EXAM: BP 119/74  Pulse 83  Ht 6' (1.829 m)  Wt 285 lb (129.275 kg)  BMI 38.65 kg/m2  SpO2 98% GENERAL: 51 year old, morbidly obese, sitting upright; NAD  HEENT: NCAT, PERRLA, EOMI; sclera clear; no xanthelasma  NECK: Unable to assess for JVD, secondary to neck girth  LUNGS: CTA bilaterally  CARDIAC: RRR (S1, S2); short, 2-3/4 systolic ejection murmur at base; no rubs or gallops  ABDOMEN: Protuberant  EXTREMETIES: Trace-1+ bilateral peripheral edema above the knees; bilateral support hose  SKIN: warm/dry; no obvious rash/lesions  MUSCULOSKELETAL: no joint deformity  NEURO: no focal deficit; NL affect    EKG:    ASSESSMENT & PLAN:  Anasarca Will continue current diuretic regimen, given loss of 11 pounds since last OV. Patient is now at 285 pounds, down from a peak of 322 on August 7. We'll order followup labs for continued close monitoring of electrolytes and renal function. Recent series of labs indicated stable renal function, with creatinine unchanged at 1.3. Will also repeat a BNP level. Will schedule early followup with me in 1 month, after which patient has expressed a desire to continue following with Dr. Andee Lineman.  Atrial flutter Status post successful RF ablation, August 2013. A followup echocardiogram 3 months post ablation was recommended, by Dr. Johney Frame, to assess for LVF recovery. Plan was to consider ICD implantation, if EF remained less than 35% (EF 20-25%, global HK, 2-D echo, 5/13).  CARDIOMYOPATHY, SECONDARY We'll order a three-month followup echocardiogram, per Dr. Jenel Lucks recommendation, following successful RF ablation of atrial flutter, in August 2013.    Gene Trenisha Lafavor, PAC

## 2012-06-08 NOTE — Patient Instructions (Addendum)
   Echo  Labs:  BMET, BNP Continue all current medications. Office will contact with results Follow up in  1 month

## 2012-06-08 NOTE — Assessment & Plan Note (Addendum)
Status post successful RF ablation, August 2013. A followup echocardiogram 3 months post ablation was recommended, by Dr. Johney Frame, to assess for LVF recovery. Plan was to consider ICD implantation, if EF remained less than 35% (EF 20-25%, global HK, 2-D echo, 5/13).

## 2012-06-08 NOTE — Assessment & Plan Note (Signed)
We'll order a three-month followup echocardiogram, per Dr. Jenel Lucks recommendation, following successful RF ablation of atrial flutter, in August 2013.

## 2012-06-09 ENCOUNTER — Other Ambulatory Visit: Payer: Self-pay

## 2012-06-09 ENCOUNTER — Other Ambulatory Visit (INDEPENDENT_AMBULATORY_CARE_PROVIDER_SITE_OTHER): Payer: Medicare Other

## 2012-06-09 DIAGNOSIS — I5022 Chronic systolic (congestive) heart failure: Secondary | ICD-10-CM

## 2012-06-09 DIAGNOSIS — I429 Cardiomyopathy, unspecified: Secondary | ICD-10-CM

## 2012-06-09 DIAGNOSIS — I4892 Unspecified atrial flutter: Secondary | ICD-10-CM

## 2012-06-15 ENCOUNTER — Telehealth: Payer: Self-pay | Admitting: *Deleted

## 2012-06-15 DIAGNOSIS — R931 Abnormal findings on diagnostic imaging of heart and coronary circulation: Secondary | ICD-10-CM

## 2012-06-15 DIAGNOSIS — R799 Abnormal finding of blood chemistry, unspecified: Secondary | ICD-10-CM

## 2012-06-15 DIAGNOSIS — Z79899 Other long term (current) drug therapy: Secondary | ICD-10-CM

## 2012-06-15 NOTE — Telephone Encounter (Signed)
Message copied by Lesle Chris on Wed Jun 15, 2012 10:38 AM ------      Message from: Rande Brunt      Created: Fri Jun 10, 2012  4:32 PM       BNP significantly improved, down to 90 from 320. Creatinine stable at 1.4 (1.3), but BUN up to 90 (48). Recommend a CBC to r/o occult bleed.

## 2012-06-15 NOTE — Telephone Encounter (Signed)
Notes Recorded by Lesle Chris, LPN on 16/05/9603 at 10:51 AM Patient notified of below. Will forward order to [facility]) for appointment with Dr. Johney Frame. ------  Notes Recorded by Lesle Chris, LPN on 54/04/8118 at 12:12 PM Left message to return call.

## 2012-06-15 NOTE — Telephone Encounter (Signed)
Message copied by Lesle Chris on Wed Jun 15, 2012 10:51 AM ------      Message from: Rande Brunt      Created: Fri Jun 10, 2012  8:23 AM       EF 25-30%, by current echo. Given that it remains < 35%, s/p RF ablation 3 months ago, recommendation is that he be referred to Dr Johney Frame for consideration of ICD implantation. Please notify pt of plan.

## 2012-06-15 NOTE — Telephone Encounter (Signed)
Notes Recorded by Lesle Chris, LPN on 46/96/2952 at 10:38 AM Patient notified. Will fax order to [facility] - patient will go today. Notes Recorded by Lesle Chris, LPN on 84/13/2440 at 11:00 AM No answer.  Notes Recorded by Lesle Chris, LPN on 06/13/2535 at 12:05 PM Left message to return call.

## 2012-06-17 ENCOUNTER — Telehealth: Payer: Self-pay | Admitting: *Deleted

## 2012-06-17 NOTE — Telephone Encounter (Signed)
Message copied by Lesle Chris on Fri Jun 17, 2012  9:13 AM ------      Message from: Prescott Parma C      Created: Thu Jun 16, 2012 11:13 AM       Stable CBC

## 2012-06-17 NOTE — Telephone Encounter (Signed)
Notes Recorded by Lesle Chris, LPN on 40/04/8118 at 9:12 AM Patient notified via voice mail.

## 2012-07-06 ENCOUNTER — Ambulatory Visit (INDEPENDENT_AMBULATORY_CARE_PROVIDER_SITE_OTHER): Payer: Medicare Other | Admitting: Physician Assistant

## 2012-07-06 ENCOUNTER — Encounter: Payer: Self-pay | Admitting: Physician Assistant

## 2012-07-06 VITALS — BP 127/67 | HR 71 | Ht 72.0 in | Wt 281.4 lb

## 2012-07-06 DIAGNOSIS — R601 Generalized edema: Secondary | ICD-10-CM

## 2012-07-06 DIAGNOSIS — I4892 Unspecified atrial flutter: Secondary | ICD-10-CM

## 2012-07-06 DIAGNOSIS — R609 Edema, unspecified: Secondary | ICD-10-CM

## 2012-07-06 DIAGNOSIS — I429 Cardiomyopathy, unspecified: Secondary | ICD-10-CM

## 2012-07-06 DIAGNOSIS — R0602 Shortness of breath: Secondary | ICD-10-CM

## 2012-07-06 NOTE — Assessment & Plan Note (Signed)
Status post successful RF ablation, August 2013.

## 2012-07-06 NOTE — Addendum Note (Signed)
Addended by: Lesle Chris on: 07/06/2012 02:59 PM   Modules accepted: Orders

## 2012-07-06 NOTE — Assessment & Plan Note (Signed)
Patient has lost an additional 4 pounds since last OV, and 42 pounds overall since a peak of 322 pounds on August 7. Therefore, will continue the same diuretic regimen. We'll check a followup BMET/BNP level.

## 2012-07-06 NOTE — Progress Notes (Signed)
Primary Cardiologist: Lewayne Bunting, MD   HPI: Patient returns for early scheduled followup.  When last seen, I ordered a followup echocardiogram for reassessment of LVF, as well as followup labs. We made no adjustments in his diuretic regimen, noting an additional loss of 11 pounds since his previous OV.   - 2-D echo: EF 25-30%, mild AS   - Labs, 10/24: BUN 90, creatinine 1.4 (previously 48/1.3, respectively), potassium 4.9. BUN 90 (320)  Clinically, he continues to report significantly increased exercise tolerance, since we began aggressive diuretic management back in the summer. He continues to monitor his weight daily, and refrains from added salt in his diet. He does note a drop in BP to approximately 100 systolic, approximately one hour after taking his a.m. medications. However, he denies any near-syncope/syncope. He also denies any tachycardia palpitations. He is able to lay flat, and denies PND. His peripheral edema remains significantly improved, as well.  Of note, he has lost an additional 4 pounds since last OV.  No Known Allergies  Current Outpatient Prescriptions  Medication Sig Dispense Refill  . allopurinol (ZYLOPRIM) 300 MG tablet Take 300 mg by mouth daily.      [name] atorvastatin (LIPITOR) 10 MG tablet Take 10 mg by mouth daily.        . cloNIDine (CATAPRES) 0.2 MG tablet Take 0.2 mg by mouth 2 (two) times daily.        [name] COLCRYS 0.6 MG tablet Take 0.6 mg by mouth as needed.       . digoxin (LANOXIN) 0.25 MG tablet TAKE ONE-HALF (1/2) TABLET ONCE DAILY  15 tablet  3  . gemfibrozil (LOPID) 600 MG tablet Take 600 mg by mouth 2 (two) times daily before a meal.        . glimepiride (AMARYL) 2 MG tablet Take 2 mg by mouth daily before supper.       . glyBURIDE-metformin (GLUCOVANCE) 5-500 MG per tablet Take 2 tablets by mouth 2 (two) times daily with a meal.        . HUMALOG 100 UNIT/ML injection Inject 20 Units into the skin 2 (two) times daily.       . insulin aspart (NOVOLOG)  100 UNIT/ML injection Inject 10 Units into the skin 2 (two) times daily before a meal.       . insulin detemir (LEVEMIR) 100 UNIT/ML injection Inject 62 Units into the skin 2 (two) times daily.      . metolazone (ZAROXOLYN) 2.5 MG tablet Take one tab by mouth every Monday, Wednesday, and Friday - 30 minutes prior to morning dose of the Demadex  30 tablet  3  . metoprolol (LOPRESSOR) 100 MG tablet Take 100 mg by mouth 2 (two) times daily.        . niacin (NIASPAN) 500 MG CR tablet Take 500 mg by mouth at bedtime.      . prazosin (MINIPRESS) 1 MG capsule Take 1 mg by mouth 2 (two) times daily.      . saxagliptin HCl (ONGLYZA) 5 MG TABS tablet Take 5 mg by mouth daily.      [name] spironolactone (ALDACTONE) 25 MG tablet Take 1 tablet (25 mg total) by mouth daily.  30 tablet  6  . torsemide (DEMADEX) 20 MG tablet Take 3 tablets (60 mg total) by mouth 2 (two) times daily.  180 tablet  6  . valsartan (DIOVAN) 320 MG tablet Take 320 mg by mouth daily.          Past  Medical History  Diagnosis Date  . Hypertension   . Morbid obesity   . Atrial flutter  10/01/2006, 12/30/11    Status post RF ablation, 7/13, Dr. Johney Frame  . Type II or unspecified type diabetes mellitus without mention of complication, not stated as uncontrolled   . OSA (obstructive sleep apnea)     compliant with CPAP  . Nonischemic cardiomyopathy     Normal coronary arteries, 2008  . Chronic systolic dysfunction of left ventricle     EF 20-25%, global HK; mild RVD, 2-D echo, 5/13  . Unspecified disorder of kidney and ureter   . RBBB, anterior fascicular block and incomplete LBBB   . Pulmonary hypertension     RVSP 50-55 mmHg, 5/13  . Valvular heart disease     Mild MR/TR, 2-D echo, 5/13    Past Surgical History  Procedure Date  . None     History   Social History  . Marital Status: Married    Spouse Name: N/A    Number of Children: N/A  . Years of Education: N/A   Occupational History  . DISABLED    Social History Main  Topics  . Smoking status: Never Smoker   . Smokeless tobacco: Never Used  . Alcohol Use: No  . Drug Use: No  . Sexually Active: Not on file   Other Topics Concern  . Not on file   Social History Narrative   Pt lives in [place] alone.  Disabled.  Previously worked in Designer, fashion/clothing and drove a truck.    Family History  Problem Relation Age of Onset  . Asthma Father 75    DECEASED  . Diabetes      ROS: no nausea, vomiting; no fever, chills; no melena, hematochezia; no claudication  PHYSICAL EXAM: BP 127/67  Pulse 71  Ht 6' (1.829 m)  Wt 281 lb 6.4 oz (127.642 kg)  BMI 38.16 kg/m2  SpO2 98% GENERAL: 51 year old, morbidly obese, sitting upright; NAD  HEENT: NCAT, PERRLA, EOMI; sclera clear; no xanthelasma  NECK: Unable to assess for JVD, secondary to neck girth  LUNGS: CTA bilaterally  CARDIAC: RRR (S1, S2); short, 2-3/4 systolic ejection murmur at base; no rubs or gallops  ABDOMEN: Protuberant  EXTREMETIES: Trace-1+ bilateral peripheral edema SKIN: warm/dry; no obvious rash/lesions  MUSCULOSKELETAL: no joint deformity  NEURO: no focal deficit; NL affect    EKG:    ASSESSMENT & PLAN:  Anasarca Patient has lost an additional 4 pounds since last OV, and 42 pounds overall since a peak of 322 pounds on August 7. Therefore, will continue the same diuretic regimen. We'll check a followup BMET/BNP level.  CARDIOMYOPATHY, SECONDARY A recent followup echocardiogram indicated persistent LVD (EF 25-30%). Given that it is now 3 months since he underwent RF ablation of atrial flutter, plan is to refer him for a formal EP evaluation for possible ICD implantation for primary prophylaxis. However, given his previously documented preference to continue following with Dr. Andee Lineman for general cardiology, we will defer to Dr. Andee Lineman as to the scheduling for this evaluation. The patient was agreeable with this plan.  Atrial flutter Status post successful RF ablation, August 2013.    Gene  Analeese Andreatta, PAC

## 2012-07-06 NOTE — Patient Instructions (Addendum)
   Labs today for BMET, BNP   Office will contact with results Continue all current medications. Follow up in  1 month Call [phone] for appointment with Dr. Andee Lineman

## 2012-07-06 NOTE — Assessment & Plan Note (Signed)
A recent followup echocardiogram indicated persistent LVD (EF 25-30%). Given that it is now 3 months since he underwent RF ablation of atrial flutter, plan is to refer him for a formal EP evaluation for possible ICD implantation for primary prophylaxis. However, given his previously documented preference to continue following with Dr. Andee Lineman for general cardiology, we will defer to Dr. Andee Lineman as to the scheduling for this evaluation. The patient was agreeable with this plan.

## 2012-07-07 ENCOUNTER — Encounter: Payer: Self-pay | Admitting: *Deleted

## 2012-07-11 ENCOUNTER — Other Ambulatory Visit: Payer: Self-pay | Admitting: Cardiology

## 2012-10-10 ENCOUNTER — Other Ambulatory Visit: Payer: Self-pay | Admitting: Physician Assistant

## 2012-11-18 ENCOUNTER — Other Ambulatory Visit: Payer: Self-pay | Admitting: Physician Assistant

## 2012-11-22 ENCOUNTER — Other Ambulatory Visit: Payer: Self-pay | Admitting: Physician Assistant

## 2012-11-24 ENCOUNTER — Other Ambulatory Visit: Payer: Self-pay | Admitting: Physician Assistant

## 2013-03-06 ENCOUNTER — Ambulatory Visit (INDEPENDENT_AMBULATORY_CARE_PROVIDER_SITE_OTHER): Payer: Medicare Other | Admitting: Cardiovascular Disease

## 2013-03-06 ENCOUNTER — Encounter: Payer: Self-pay | Admitting: Cardiovascular Disease

## 2013-03-06 ENCOUNTER — Other Ambulatory Visit: Payer: Self-pay | Admitting: Cardiovascular Disease

## 2013-03-06 VITALS — BP 125/74 | HR 61 | Ht 72.0 in | Wt 254.8 lb

## 2013-03-06 DIAGNOSIS — I350 Nonrheumatic aortic (valve) stenosis: Secondary | ICD-10-CM

## 2013-03-06 DIAGNOSIS — I872 Venous insufficiency (chronic) (peripheral): Secondary | ICD-10-CM

## 2013-03-06 DIAGNOSIS — I4892 Unspecified atrial flutter: Secondary | ICD-10-CM

## 2013-03-06 DIAGNOSIS — I429 Cardiomyopathy, unspecified: Secondary | ICD-10-CM

## 2013-03-06 DIAGNOSIS — I5022 Chronic systolic (congestive) heart failure: Secondary | ICD-10-CM

## 2013-03-06 DIAGNOSIS — I10 Essential (primary) hypertension: Secondary | ICD-10-CM

## 2013-03-06 DIAGNOSIS — I359 Nonrheumatic aortic valve disorder, unspecified: Secondary | ICD-10-CM

## 2013-03-06 NOTE — Patient Instructions (Addendum)
Your physician wants you to follow-up in: 1 YEAR You will receive a reminder letter in the mail two months in advance. If you don't receive a letter, please call our office to schedule the follow-up appointment.  REFERRAL WITH [facility]

## 2013-03-06 NOTE — Progress Notes (Signed)
Patient ID: Joelene Millin, male   DOB: May 20, 1961, 52 y.o.   MRN: [id number]    SUBJECTIVE: Mr. Wenig is a 52 y.o. Male with a PMH significant for HTN, morbid obesity, atrial flutter s/p RF ablation in July 2013, type II DM, OSA, NICM with normal coronaries in 2008, mild aortic stenosis, and chronic systolic HF with an EF of 25-30%. He feels very well today, denying chest pain, SOB, PND, and orthopnea. He's lost 42 lbs since November 30, 2012, and feels much better.     BP: 125/74  Pulse 61    PHYSICAL EXAM General: NAD Neck: No JVD, no thyromegaly or thyroid nodule.  Lungs: Clear to auscultation bilaterally with normal respiratory effort. CV: Nondisplaced PMI.  Heart regular S1/S2, no S3/S4, soft I/VI ESM.  No peripheral edema.  No carotid bruit.  Normal pedal pulses.  Abdomen: Soft, nontender, no hepatosplenomegaly, no distention.  Neurologic: Alert and oriented x 3.  Psych: Normal affect. Extremities: No clubbing or cyanosis. Pre-tibial scaling erythema.    LABS: Basic Metabolic Panel: No results found for this basename: NA, K, CL, CO2, GLUCOSE, BUN, CREATININE, CALCIUM, MG, PHOS,  in the last 72 hours Liver Function Tests: No results found for this basename: AST, ALT, ALKPHOS, BILITOT, PROT, ALBUMIN,  in the last 72 hours No results found for this basename: LIPASE, AMYLASE,  in the last 72 hours CBC: No results found for this basename: WBC, NEUTROABS, HGB, HCT, MCV, PLT,  in the last 72 hours Cardiac Enzymes: No results found for this basename: CKTOTAL, CKMB, CKMBINDEX, TROPONINI,  in the last 72 hours BNP: No components found with this basename: POCBNP,  D-Dimer: No results found for this basename: DDIMER,  in the last 72 hours Hemoglobin A1C: No results found for this basename: HGBA1C,  in the last 72 hours Fasting Lipid Panel: No results found for this basename: CHOL, HDL, LDLCALC, TRIG, CHOLHDL, LDLDIRECT,  in the last 72 hours Thyroid Function Tests: No results  found for this basename: TSH, T4TOTAL, FREET3, T3FREE, THYROIDAB,  in the last 72 hours Anemia Panel: No results found for this basename: VITAMINB12, FOLATE, FERRITIN, TIBC, IRON, RETICCTPCT,  in the last 72 hours  Echo (October 2013): - Left ventricle: The cavity size was mildly dilated. There was mild concentric hypertrophy. Systolic function was severely reduced. The estimated ejection fraction was in the range of 25% to 30%. - Aortic valve: Transvalvular velocity was minimally increased. There was mild stenosis. Valve area: 1.64cm^2(VTI). - Left atrium: The atrium was moderately dilated. - Right atrium: The atrium was mildly to moderately dilated. - Pulmonary arteries: Systolic pressure was mildly increased.     ASSESSMENT AND PLAN: 1. Non-ischemic cardiomyopathy, EF 25-30%: has had a discussion re: AICD with Dr. Johney Frame, and the pt will continue to think about this. 2. Chronic systolic HF: euvolemic and stable. 3. HTN: controlled. 4. Atrial flutter s/p RF ablation in July 2013 5. Aortic stenosis: monitor by exam and serial echocardiograms. 6. Venous insufficiency: referral to vascular surgery.   Prentice Docker, M.D., F.A.C.C.

## 2013-03-17 ENCOUNTER — Other Ambulatory Visit: Payer: Self-pay | Admitting: Cardiology

## 2013-03-17 MED ORDER — PRAZOSIN HCL 1 MG PO CAPS: 1.0000 mg | ORAL_CAPSULE | Freq: Two times a day (BID) | ORAL | Status: DC

## 2013-03-28 ENCOUNTER — Other Ambulatory Visit: Payer: Self-pay | Admitting: *Deleted

## 2013-03-28 DIAGNOSIS — I872 Venous insufficiency (chronic) (peripheral): Secondary | ICD-10-CM

## 2013-03-28 DIAGNOSIS — T82898A Other specified complication of vascular prosthetic devices, implants and grafts, initial encounter: Secondary | ICD-10-CM

## 2013-05-03 ENCOUNTER — Encounter: Payer: Self-pay | Admitting: Vascular Surgery

## 2013-05-04 ENCOUNTER — Encounter: Payer: Medicare Other | Admitting: Vascular Surgery

## 2013-06-22 ENCOUNTER — Other Ambulatory Visit: Payer: Self-pay

## 2013-06-26 ENCOUNTER — Encounter: Payer: Self-pay | Admitting: Vascular Surgery

## 2013-06-26 ENCOUNTER — Encounter: Payer: Self-pay | Admitting: Cardiovascular Disease

## 2013-06-26 NOTE — Telephone Encounter (Signed)
Spoke with patient - c/o SOB, swelling in legs/ankles/abdomen x 1 1/2 weeks.  Weight today was 290.  Stated he had been weighing 250.  Stated he did loose about 40 lbs since last OV.  Stated that he did start taking Levimir Flex pen x 1 mo now & has really not felt good since.  OV scheduled for Wednesday, 06/28/2013 at 3:00 with Dr. Purvis Sheffield.  Advised to go to ED for eval if symptoms worsen.  Patient verbalized understanding.

## 2013-06-28 ENCOUNTER — Ambulatory Visit (INDEPENDENT_AMBULATORY_CARE_PROVIDER_SITE_OTHER): Payer: Medicare Other | Admitting: Cardiovascular Disease

## 2013-06-28 ENCOUNTER — Encounter: Payer: Self-pay | Admitting: Cardiovascular Disease

## 2013-06-28 VITALS — BP 131/85 | HR 88 | Ht 71.0 in | Wt 302.0 lb

## 2013-06-28 DIAGNOSIS — I509 Heart failure, unspecified: Secondary | ICD-10-CM

## 2013-06-28 DIAGNOSIS — I359 Nonrheumatic aortic valve disorder, unspecified: Secondary | ICD-10-CM

## 2013-06-28 DIAGNOSIS — R0602 Shortness of breath: Secondary | ICD-10-CM

## 2013-06-28 DIAGNOSIS — I350 Nonrheumatic aortic (valve) stenosis: Secondary | ICD-10-CM

## 2013-06-28 DIAGNOSIS — Z79899 Other long term (current) drug therapy: Secondary | ICD-10-CM

## 2013-06-28 DIAGNOSIS — I5023 Acute on chronic systolic (congestive) heart failure: Secondary | ICD-10-CM

## 2013-06-28 DIAGNOSIS — R609 Edema, unspecified: Secondary | ICD-10-CM

## 2013-06-28 DIAGNOSIS — I429 Cardiomyopathy, unspecified: Secondary | ICD-10-CM

## 2013-06-28 DIAGNOSIS — I872 Venous insufficiency (chronic) (peripheral): Secondary | ICD-10-CM

## 2013-06-28 DIAGNOSIS — N183 Chronic kidney disease, stage 3 unspecified: Secondary | ICD-10-CM

## 2013-06-28 DIAGNOSIS — I4892 Unspecified atrial flutter: Secondary | ICD-10-CM

## 2013-06-28 DIAGNOSIS — I10 Essential (primary) hypertension: Secondary | ICD-10-CM

## 2013-06-28 MED ORDER — METOLAZONE 5 MG PO TABS: 5.0000 mg | ORAL_TABLET | ORAL | Status: DC

## 2013-06-28 NOTE — Patient Instructions (Signed)
   Begin Metolazone 5mg  daily x 5 days only, then STOP Continue all other medications.   Lab for BMET - due on Monday, 07/03/13 Chest x-ray - this week Follow up in [place] on Monday

## 2013-06-28 NOTE — Progress Notes (Signed)
Patient ID: [name], male   DOB: 09-06-1960, 52 y.o.   MRN: [id number]      SUBJECTIVE: [name] is a 52 yr old male whom I last saw in July. He has a PMH significant for HTN, morbid obesity, atrial flutter s/p RF ablation in July 2013, type II DM, OSA, NICM with normal coronaries in 2008, mild aortic stenosis, and chronic systolic HF with an EF of 25-30%. He had been doing well at that time.  Recently however, he's been experiencing shortness of breath and swelling in his legs and abdomen over the past 7-10 days. He reports a significant weight gain as well. He believes he's put on 50 lbs since July. He thinks this started when he began using his Levemir insulin pen.  He's been having intermittent chest pain primarily when he lies down, lasting a few seconds and then subsiding. He sleeps with 1 pillow. He does describe nightly PND.     No Known Allergies  Current Outpatient Prescriptions  Medication Sig Dispense Refill  . allopurinol (ZYLOPRIM) 300 MG tablet Take 300 mg by mouth daily.      [name] atorvastatin (LIPITOR) 10 MG tablet Take 10 mg by mouth daily.        . Canagliflozin (INVOKANA) 300 MG TABS Take 300 mg by mouth daily.      . cloNIDine (CATAPRES) 0.2 MG tablet Take 0.2 mg by mouth 2 (two) times daily.        [name] COLCRYS 0.6 MG tablet Take 0.6 mg by mouth as needed.       . digoxin (LANOXIN) 0.25 MG tablet TAKE ONE-HALF (1/2) TABLET ONCE DAILY  15 tablet  2  . gemfibrozil (LOPID) 600 MG tablet Take 600 mg by mouth 2 (two) times daily before a meal.        . glimepiride (AMARYL) 2 MG tablet Take 2 mg by mouth daily before supper.       . insulin aspart (NOVOLOG) 100 UNIT/ML injection Inject 20 Units into the skin 2 (two) times daily before a meal.       . insulin detemir (LEVEMIR) 100 UNIT/ML injection Inject 80 Units into the skin 2 (two) times daily.       . metFORMIN (GLUCOPHAGE) 1000 MG tablet Take 1,000 mg by mouth 2 (two) times daily with a meal.      . metoprolol  (LOPRESSOR) 100 MG tablet Take 100 mg by mouth 2 (two) times daily.        . prazosin (MINIPRESS) 1 MG capsule Take 1 capsule (1 mg total) by mouth 2 (two) times daily.  60 capsule  6  . sitaGLIPtin (JANUVIA) 100 MG tablet Take 100 mg by mouth daily.      [name] spironolactone (ALDACTONE) 25 MG tablet Take 1 tablet (25 mg total) by mouth daily.  30 tablet  6  . torsemide (DEMADEX) 20 MG tablet Take 60 mg by mouth 2 (two) times daily.       . valsartan (DIOVAN) 160 MG tablet Take 160 mg by mouth daily.       No current facility-administered medications for this visit.    Past Medical History  Diagnosis Date  . Hypertension   . Morbid obesity   . Atrial flutter  10/01/2006, 12/30/11    Status post RF ablation, 7/13, Dr. Johney Frame  . Type II or unspecified type diabetes mellitus without mention of complication, not stated as uncontrolled   . OSA (obstructive sleep apnea)  compliant with CPAP  . Nonischemic cardiomyopathy     Normal coronary arteries, 2008  . Chronic systolic dysfunction of left ventricle     EF 20-25%, global HK; mild RVD, 2-D echo, 5/13  . Unspecified disorder of kidney and ureter   . RBBB, anterior fascicular block and incomplete LBBB   . Pulmonary hypertension     RVSP 50-55 mmHg, 5/13  . Valvular heart disease     Mild MR/TR, 2-D echo, 5/13    Past Surgical History  Procedure Laterality Date  . None      History   Social History  . Marital Status: Married    Spouse Name: N/A    Number of Children: N/A  . Years of Education: N/A   Occupational History  . DISABLED    Social History Main Topics  . Smoking status: Never Smoker   . Smokeless tobacco: Never Used  . Alcohol Use: No  . Drug Use: No  . Sexual Activity: Not on file   Other Topics Concern  . Not on file   Social History Narrative   Pt lives in [place] alone.  Disabled.  Previously worked in Designer, fashion/clothing and drove a truck.     Filed Vitals:   06/28/13 1459  BP: 131/85  Pulse: 88  Height:  5\' 11"  (1.803 m)  Weight: 302 lb (136.986 kg)    PHYSICAL EXAM General: NAD Neck: No JVD, no thyromegaly or thyroid nodule.  Lungs: Faint crackles at right base with normal respiratory effort, otherwise clear. CV: Nondisplaced PMI.  Heart regular S1/S2, no S3/S4, I/VI ejection systolic murmur at RUSB.  2-3+ pitting pedal edema with bilateral pretibial erythema, extending up to knees.  No carotid bruit.  Normal pedal pulses.  Abdomen: Soft, obese, nontender, no hepatosplenomegaly, mild distention.  Neurologic: Alert and oriented x 3.  Psych: Normal affect. Extremities: No clubbing or cyanosis.   ECG: reviewed and available in electronic records.     ASSESSMENT AND PLAN: 1. Acute on chronic systolic heart failure: I will add metolazone 5 mg daily x next 5 days and check a BMET on 11/17. Will obtain a chest xray as well. Will continue current doses of torsemide (60 mg bid) and spironolactone (25 mg daily). 2. HTN: controlled on present therapy. 3. Aortic stenosis: mild by echo in 05/2012.  4. Non-ischemic cardiomyopathy, EF 25-30%: has had a discussion re: AICD with Dr. Johney Frame, and the pt will continue to think about this.  5. Atrial flutter s/p RF ablation in July 2013  6. Venous insufficiency: I previously made a referral to vascular surgery.  Dispo: f/u with me on 11/17 in [place].  Prentice Docker, M.D., F.A.C.C.

## 2013-07-03 ENCOUNTER — Other Ambulatory Visit: Payer: Self-pay | Admitting: Cardiovascular Disease

## 2013-07-03 ENCOUNTER — Encounter: Payer: Self-pay | Admitting: Cardiovascular Disease

## 2013-07-03 ENCOUNTER — Ambulatory Visit (INDEPENDENT_AMBULATORY_CARE_PROVIDER_SITE_OTHER): Payer: Medicare Other | Admitting: Cardiovascular Disease

## 2013-07-03 VITALS — BP 113/63 | HR 72 | Ht 71.0 in | Wt 277.0 lb

## 2013-07-03 DIAGNOSIS — I509 Heart failure, unspecified: Secondary | ICD-10-CM

## 2013-07-03 DIAGNOSIS — I872 Venous insufficiency (chronic) (peripheral): Secondary | ICD-10-CM

## 2013-07-03 DIAGNOSIS — I4892 Unspecified atrial flutter: Secondary | ICD-10-CM

## 2013-07-03 DIAGNOSIS — R0602 Shortness of breath: Secondary | ICD-10-CM

## 2013-07-03 DIAGNOSIS — I10 Essential (primary) hypertension: Secondary | ICD-10-CM

## 2013-07-03 DIAGNOSIS — N183 Chronic kidney disease, stage 3 unspecified: Secondary | ICD-10-CM

## 2013-07-03 DIAGNOSIS — R600 Localized edema: Secondary | ICD-10-CM

## 2013-07-03 DIAGNOSIS — I429 Cardiomyopathy, unspecified: Secondary | ICD-10-CM

## 2013-07-03 DIAGNOSIS — R609 Edema, unspecified: Secondary | ICD-10-CM

## 2013-07-03 DIAGNOSIS — I359 Nonrheumatic aortic valve disorder, unspecified: Secondary | ICD-10-CM

## 2013-07-03 DIAGNOSIS — I350 Nonrheumatic aortic (valve) stenosis: Secondary | ICD-10-CM

## 2013-07-03 DIAGNOSIS — Z79899 Other long term (current) drug therapy: Secondary | ICD-10-CM

## 2013-07-03 DIAGNOSIS — I5023 Acute on chronic systolic (congestive) heart failure: Secondary | ICD-10-CM

## 2013-07-03 LAB — BASIC METABOLIC PANEL
Calcium: 9.8 mg/dL (ref 8.4–10.5)
Creat: 1.4 mg/dL — ABNORMAL HIGH (ref 0.50–1.35)
Glucose, Bld: 400 mg/dL — ABNORMAL HIGH (ref 70–99)
Sodium: 135 mEq/L (ref 135–145)

## 2013-07-03 MED ORDER — TORSEMIDE 20 MG PO TABS: 80.0000 mg | ORAL_TABLET | Freq: Two times a day (BID) | ORAL | Status: DC

## 2013-07-03 MED ORDER — METOLAZONE 5 MG PO TABS: ORAL_TABLET | ORAL | Status: DC

## 2013-07-03 NOTE — Patient Instructions (Addendum)
Your physician recommends that you schedule a follow-up appointment in: 8-10 weeks  Your physician has recommended you make the following change in your medication:   1) INCREASE YOUR TORSEMIDE ON 07-06-13 TO 80MG  TWICE DAILY 2) TAKE METOLAZONE 5MG  ONCE DAILY TODAY 07-03-13,07-04-13 AND 07-05-13, THEN ON 07-05-13 ONLY TAKE METOLAZONE 5MG  AS NEEDED FOR WEIGHT GAIN FROM 3-5 POUNDS AND CALL OFFICE TO INFORM Dr. Purvis Sheffield  3) Dr. Purvis Sheffield RECOMMENDS THAT YOU WEAR COMPRESSION STOCKINGS, THE PRESCRIPTION TO BE FITTED AT [facility] HAS BEEN GIVEN WITH YOUR DISCHARGE PAPERS

## 2013-07-03 NOTE — Progress Notes (Signed)
Patient ID: [name], male   DOB: [birth year], 52 y.o.   MRN: [id number]      SUBJECTIVE: [name] is a 52 yr old male who has a PMH significant for HTN, morbid obesity, atrial flutter s/p RF ablation in July 2013, type II DM, OSA, NICM with normal coronaries in 2008, mild aortic stenosis, and chronic systolic HF with an EF of 25-30%.  He had recently been experiencing shortness of breath and swelling in his legs and abdomen, and reported a significant weight gain as well. He believes he's put on 50 lbs since July. He thinks this started when he began using his Levemir insulin pen.  He's been having intermittent chest pain primarily when he lies down, lasting a few seconds and then subsiding.  He sleeps with 1 pillow. He does describe nightly PND.  For this reason, I started him on metolazone 5 mg daily x 5 days along with his maintenance dose of torsemide 60 mg bid and spironolactone 25 mg daily. His CXR showed "stable cardiomegaly and central pulmonary vascular congestion, which may represent chronic CHF". He had his BMET drawn 20 minutes ago and the results aren't presently available.  He feels much better! He's lost 25 lbs with a marked reduction in leg swelling, and no longer has chest pain and shortness of breath.      No Known Allergies  Current Outpatient Prescriptions  Medication Sig Dispense Refill  . allopurinol (ZYLOPRIM) 300 MG tablet Take 300 mg by mouth daily.      [name] atorvastatin (LIPITOR) 10 MG tablet Take 10 mg by mouth daily.        . Canagliflozin (INVOKANA) 300 MG TABS Take 300 mg by mouth daily.      . cloNIDine (CATAPRES) 0.2 MG tablet Take 0.2 mg by mouth 2 (two) times daily.        [name] COLCRYS 0.6 MG tablet Take 0.6 mg by mouth as needed.       . digoxin (LANOXIN) 0.25 MG tablet TAKE ONE-HALF (1/2) TABLET ONCE DAILY  15 tablet  2  . gemfibrozil (LOPID) 600 MG tablet Take 600 mg by mouth 2 (two) times daily before a meal.        . glimepiride (AMARYL) 2 MG  tablet Take 2 mg by mouth daily before supper.       . insulin aspart (NOVOLOG) 100 UNIT/ML injection Inject 20 Units into the skin 2 (two) times daily before a meal.       . insulin detemir (LEVEMIR) 100 UNIT/ML injection Inject 80 Units into the skin 2 (two) times daily.       . metFORMIN (GLUCOPHAGE) 1000 MG tablet Take 1,000 mg by mouth 2 (two) times daily with a meal.      . metolazone (ZAROXOLYN) 5 MG tablet Take 1 tablet (5 mg total) by mouth as directed.  30 tablet  1  . metoprolol (LOPRESSOR) 100 MG tablet Take 100 mg by mouth 2 (two) times daily.        . prazosin (MINIPRESS) 1 MG capsule Take 1 capsule (1 mg total) by mouth 2 (two) times daily.  60 capsule  6  . sitaGLIPtin (JANUVIA) 100 MG tablet Take 100 mg by mouth daily.      [name] spironolactone (ALDACTONE) 25 MG tablet Take 1 tablet (25 mg total) by mouth daily.  30 tablet  6  . torsemide (DEMADEX) 20 MG tablet Take 60 mg by mouth 2 (two) times daily.       [name]  valsartan (DIOVAN) 160 MG tablet Take 160 mg by mouth daily.       No current facility-administered medications for this visit.    Past Medical History  Diagnosis Date  . Hypertension   . Morbid obesity   . Atrial flutter  10/01/2006, 12/30/11    Status post RF ablation, 7/13, Dr. Johney Frame  . Type II or unspecified type diabetes mellitus without mention of complication, not stated as uncontrolled   . OSA (obstructive sleep apnea)     compliant with CPAP  . Nonischemic cardiomyopathy     Normal coronary arteries, 2008  . Chronic systolic dysfunction of left ventricle     EF 20-25%, global HK; mild RVD, 2-D echo, 5/13  . Unspecified disorder of kidney and ureter   . RBBB, anterior fascicular block and incomplete LBBB   . Pulmonary hypertension     RVSP 50-55 mmHg, 5/13  . Valvular heart disease     Mild MR/TR, 2-D echo, 5/13    Past Surgical History  Procedure Laterality Date  . None      History   Social History  . Marital Status: Married    Spouse Name: N/A     Number of Children: N/A  . Years of Education: N/A   Occupational History  . DISABLED    Social History Main Topics  . Smoking status: Never Smoker   . Smokeless tobacco: Never Used  . Alcohol Use: No  . Drug Use: No  . Sexual Activity: Not on file   Other Topics Concern  . Not on file   Social History Narrative   Pt lives in [place] alone.  Disabled.  Previously worked in Designer, fashion/clothing and drove a truck.     BP 113/63 Pulse 72  Weight 277 lb (125.646 kg)----had been 302 lbs last week.  PHYSICAL EXAM General: NAD  Neck: No JVD, no thyromegaly or thyroid nodule.  Lungs: Faint crackles at right base with normal respiratory effort, otherwise clear.  CV: Nondisplaced PMI. Heart regular S1/S2, no S3/S4, I/VI ejection systolic murmur at RUSB. 1-2+ pitting pedal edema with bilateral pretibial erythema extending up to knees, skin has a more wrinkled appearance when compared to last week. No carotid bruit. Normal pedal pulses.  Abdomen: Soft, obese, nontender, no hepatosplenomegaly, no distention.  Neurologic: Alert and oriented x 3.  Psych: Normal affect.  Extremities: No clubbing or cyanosis.   ECG: reviewed and available in electronic records.         ASSESSMENT AND PLAN: 1. Acute on chronic systolic heart failure: he has improved tremendously. I will continue metolazone 5 mg daily x next 3 days and follow up on BMET drawn today. Will increase current maintenance dose of torsemide to 80 mg bid starting 11/20, and continue spironolactone 25 mg daily. I will prescribe knee-high compression stockings 20-30 mmHg. I have asked him to take an extra 5 mg of metolazone for 3 days for a weight gain of 3-5 lbs, and to inform me if this were to happen. Will have to closely monitor renal function. 2. HTN: controlled on present therapy.  3. Aortic stenosis: mild by echo in 05/2012.  4. Non-ischemic cardiomyopathy, EF 25-30%: has had a discussion re: AICD with Dr. Johney Frame, and the pt will  continue to think about this.  5. Atrial flutter s/p RF ablation in July 2013  6. Venous insufficiency: I previously made a referral to vascular surgery.  Dispo: f/u 8-10 weeks.   Prentice Docker, M.D., F.A.C.C.

## 2013-07-04 NOTE — Telephone Encounter (Signed)
Message copied by Nori Riis on Tue Jul 04, 2013 11:22 AM ------      Message from: Prentice Docker A      Created: Tue Jul 04, 2013 10:55 AM       Repeat BMET in 2 weeks. ------

## 2013-07-04 NOTE — Addendum Note (Signed)
Addended by: Thompson Grayer on: 07/04/2013 02:39 PM   Modules accepted: Orders

## 2013-07-20 ENCOUNTER — Other Ambulatory Visit: Payer: Self-pay | Admitting: Cardiovascular Disease

## 2013-07-21 ENCOUNTER — Encounter: Payer: Self-pay | Admitting: *Deleted

## 2013-07-21 LAB — BASIC METABOLIC PANEL
CO2: 27 mEq/L (ref 19–32)
Calcium: 9.7 mg/dL (ref 8.4–10.5)
Chloride: 97 mEq/L (ref 96–112)
Potassium: 4.9 mEq/L (ref 3.5–5.3)
Sodium: 137 mEq/L (ref 135–145)

## 2013-09-04 ENCOUNTER — Encounter: Payer: Self-pay | Admitting: Cardiovascular Disease

## 2013-09-04 ENCOUNTER — Ambulatory Visit (INDEPENDENT_AMBULATORY_CARE_PROVIDER_SITE_OTHER): Payer: Medicare Other | Admitting: Cardiovascular Disease

## 2013-09-04 VITALS — BP 113/72 | HR 70 | Ht 72.0 in | Wt 276.0 lb

## 2013-09-04 DIAGNOSIS — I10 Essential (primary) hypertension: Secondary | ICD-10-CM

## 2013-09-04 DIAGNOSIS — Z79899 Other long term (current) drug therapy: Secondary | ICD-10-CM

## 2013-09-04 DIAGNOSIS — I5022 Chronic systolic (congestive) heart failure: Secondary | ICD-10-CM

## 2013-09-04 DIAGNOSIS — I447 Left bundle-branch block, unspecified: Secondary | ICD-10-CM

## 2013-09-04 DIAGNOSIS — N183 Chronic kidney disease, stage 3 unspecified: Secondary | ICD-10-CM

## 2013-09-04 DIAGNOSIS — I359 Nonrheumatic aortic valve disorder, unspecified: Secondary | ICD-10-CM

## 2013-09-04 DIAGNOSIS — I872 Venous insufficiency (chronic) (peripheral): Secondary | ICD-10-CM

## 2013-09-04 DIAGNOSIS — E785 Hyperlipidemia, unspecified: Secondary | ICD-10-CM

## 2013-09-04 DIAGNOSIS — I509 Heart failure, unspecified: Secondary | ICD-10-CM

## 2013-09-04 DIAGNOSIS — I350 Nonrheumatic aortic (valve) stenosis: Secondary | ICD-10-CM

## 2013-09-04 DIAGNOSIS — I4891 Unspecified atrial fibrillation: Secondary | ICD-10-CM

## 2013-09-04 DIAGNOSIS — I429 Cardiomyopathy, unspecified: Secondary | ICD-10-CM

## 2013-09-04 MED ORDER — SPIRONOLACTONE 25 MG PO TABS: 25.0000 mg | ORAL_TABLET | Freq: Every day | ORAL | Status: DC

## 2013-09-04 NOTE — Progress Notes (Signed)
Patient ID: [name], male   DOB: [birth year], 53 y.o.   MRN: [id number]      SUBJECTIVE: The patient is here for routine cardiovascular followup. He thinks he is doing well and denies chest pain and says his shortness of breath has diminished significantly in severity. He denies lightheadedness, dizziness and syncope. He denies orthopnea and paroxysmal nocturnal dyspnea. His leg swelling is well controlled. He has only had to use a few tablets of metolazone on 3 different occasions in the past 10 weeks. He has lost a considerable amount of weight and now weighs 276 pounds today (277 lbs at last visit on 07/03/13).   BUN/creatinine 35/1.19 on 07/21/13.  No Known Allergies  Current Outpatient Prescriptions  Medication Sig Dispense Refill  . allopurinol (ZYLOPRIM) 300 MG tablet Take 300 mg by mouth daily.      [name] atorvastatin (LIPITOR) 10 MG tablet Take 10 mg by mouth daily.        . Canagliflozin (INVOKANA) 300 MG TABS Take 300 mg by mouth daily.      . cloNIDine (CATAPRES) 0.2 MG tablet Take 0.2 mg by mouth 2 (two) times daily.        [name] COLCRYS 0.6 MG tablet Take 0.6 mg by mouth as needed.       . digoxin (LANOXIN) 0.25 MG tablet TAKE ONE-HALF (1/2) TABLET ONCE DAILY  15 tablet  2  . gemfibrozil (LOPID) 600 MG tablet Take 600 mg by mouth 2 (two) times daily before a meal.        . glimepiride (AMARYL) 2 MG tablet Take 2 mg by mouth daily before supper.       . insulin aspart (NOVOLOG) 100 UNIT/ML injection Inject 20 Units into the skin 2 (two) times daily before a meal.       . insulin detemir (LEVEMIR) 100 UNIT/ML injection Inject 80 Units into the skin 2 (two) times daily.       . metFORMIN (GLUCOPHAGE) 1000 MG tablet Take 1,000 mg by mouth 2 (two) times daily with a meal.      . metolazone (ZAROXOLYN) 5 MG tablet TAKE 07-03-13,07-04-13,07-05-13 THEN PRN ON 07-06-13  30 tablet  6  . metoprolol (LOPRESSOR) 100 MG tablet Take 100 mg by mouth 2 (two) times daily.        . prazosin  (MINIPRESS) 1 MG capsule Take 1 capsule (1 mg total) by mouth 2 (two) times daily.  60 capsule  6  . sitaGLIPtin (JANUVIA) 100 MG tablet Take 100 mg by mouth daily.      [name] spironolactone (ALDACTONE) 25 MG tablet Take 1 tablet (25 mg total) by mouth daily.  30 tablet  6  . torsemide (DEMADEX) 20 MG tablet Take 4 tablets (80 mg total) by mouth 2 (two) times daily.  240 tablet  6  . valsartan (DIOVAN) 160 MG tablet Take 160 mg by mouth daily.       No current facility-administered medications for this visit.    Past Medical History  Diagnosis Date  . Hypertension   . Morbid obesity   . Atrial flutter  10/01/2006, 12/30/11    Status post RF ablation, 7/13, Dr. [name]  . Type II or unspecified type diabetes mellitus without mention of complication, not stated as uncontrolled   . OSA (obstructive sleep apnea)     compliant with CPAP  . Nonischemic cardiomyopathy     Normal coronary arteries, 2008  . Chronic systolic dysfunction of left ventricle  EF 20-25%, global HK; mild RVD, 2-D echo, 5/13  . Unspecified disorder of kidney and ureter   . RBBB, anterior fascicular block and incomplete LBBB   . Pulmonary hypertension     RVSP 50-55 mmHg, 5/13  . Valvular heart disease     Mild MR/TR, 2-D echo, 5/13    Past Surgical History  Procedure Laterality Date  . None      History   Social History  . Marital Status: Married    Spouse Name: N/A    Number of Children: N/A  . Years of Education: N/A   Occupational History  . DISABLED    Social History Main Topics  . Smoking status: Never Smoker   . Smokeless tobacco: Never Used  . Alcohol Use: No  . Drug Use: No  . Sexual Activity: Not on file   Other Topics Concern  . Not on file   Social History Narrative   Pt lives in [place] alone.  Disabled.  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     Filed Vitals:   09/04/13 0851  BP: 113/72  Pulse: 70  Height: 6' (1.829 m)  Weight: 276 lb (125.193 kg)    PHYSICAL  EXAM General: NAD  Neck: No JVD, no thyromegaly or thyroid nodule.  Lungs: Clear to auscultation bilaterally with normal respiratory effort.  CV: Nondisplaced PMI. Heart regular S1/S2, no S3/S4, soft I/VI ESM. No peripheral edema. No carotid bruit. Normal pedal pulses.  Abdomen: Soft, nontender, no hepatosplenomegaly, no distention.  Neurologic: Alert and oriented x 3.  Psych: Normal affect.  Extremities: No clubbing or cyanosis.   ECG: reviewed and available in electronic records.      ASSESSMENT AND PLAN: 1. Chronic systolic heart failure: he is symptomatically stable and compensated. I will continue current diuretic regimen (will refill meds) and have BMET drawn this week to monitor renal function. Currently takes torsemide 80 mg bid and spironolactone 25 mg daily. He continues to wear the knee-high compression stockings (20-30 mmHg) I prescribed at his last visit, which has helped. I have asked him to take an extra 5 mg of metolazone for 3 days for a weight gain of 3-5 lbs, and to inform me if this were to happen.  2. HTN: controlled on present therapy.  3. Aortic stenosis: mild by echo in 05/2012.  4. Non-ischemic cardiomyopathy, EF 25-30%: has had a discussion re: AICD with Dr. [name] in the past. His QRS is 174 ms today.  I explained to him the benefits of biventricular pacing as well as an ICD, and he is now much more apt to consider this. I will schedule a followup visit with Dr. [name] with regards to this. 5. Atrial flutter s/p RF ablation in July 2013  6. Venous insufficiency: I previously made a referral to vascular surgery, but he has yet to follow up on this.  Dispo: f/u 3 months.  Kate Sable, M.D., F.A.C.C.

## 2013-09-04 NOTE — Patient Instructions (Signed)
Continue all current medications. Appointment with Dr. [name]  Lab for BMET  Office will contact with results via phone or letter.   Follow up in  3 months

## 2013-09-12 ENCOUNTER — Telehealth: Payer: Self-pay | Admitting: *Deleted

## 2013-09-12 DIAGNOSIS — Z79899 Other long term (current) drug therapy: Secondary | ICD-10-CM

## 2013-09-12 DIAGNOSIS — R7989 Other specified abnormal findings of blood chemistry: Secondary | ICD-10-CM

## 2013-09-12 MED ORDER — TORSEMIDE 20 MG PO TABS
40.0000 mg | ORAL_TABLET | Freq: Two times a day (BID) | ORAL | Status: DC
Start: 2013-09-12 — End: 2013-09-28

## 2013-09-12 NOTE — Telephone Encounter (Signed)
Notes Recorded by Laurine Blazer, LPN on 3/54/5625 at 6:38 PM Patient notified and verbalized understanding. Will mail order. He will go around September 26, 2013.   Notes Recorded by Laurine Blazer, LPN on 9/37/3428 at 76:81 AM Left message to return call.  Notes Recorded by Laurine Blazer, LPN on 1/57/2620 at 3:55 AM Left message to return call.

## 2013-09-12 NOTE — Telephone Encounter (Signed)
Message copied by Laurine Blazer on Tue Sep 12, 2013  4:40 PM ------      Message from: Kate Sable A      Created: Wed Sep 06, 2013  9:48 AM       Will need to cut back torsemide to 40 mg bid and repeat bmet in 2 weeks. ------

## 2013-09-28 ENCOUNTER — Telehealth: Payer: Self-pay | Admitting: *Deleted

## 2013-09-28 DIAGNOSIS — I5023 Acute on chronic systolic (congestive) heart failure: Secondary | ICD-10-CM

## 2013-09-28 MED ORDER — TORSEMIDE 20 MG PO TABS: 40.0000 mg | ORAL_TABLET | Freq: Every morning | ORAL | Status: DC

## 2013-09-28 NOTE — Telephone Encounter (Signed)
Message copied by Merlene Laughter on Thu Sep 28, 2013 11:11 AM ------      Message from: Kate Sable A      Created: Thu Sep 28, 2013  9:40 AM       Labs improved but BUN still elevated. Decrease torsemide to 40 mg q am and 20 mg q pm and repeat BMET in 3 weeks. ------

## 2013-09-28 NOTE — Telephone Encounter (Signed)
Patient informed and will get lab order during office visit on Monday the 16 th.

## 2013-10-02 ENCOUNTER — Ambulatory Visit (INDEPENDENT_AMBULATORY_CARE_PROVIDER_SITE_OTHER): Payer: Medicare Other | Admitting: Internal Medicine

## 2013-10-02 ENCOUNTER — Encounter: Payer: Self-pay | Admitting: Internal Medicine

## 2013-10-02 ENCOUNTER — Other Ambulatory Visit: Payer: Self-pay | Admitting: *Deleted

## 2013-10-02 VITALS — BP 122/70 | HR 70 | Ht 71.0 in | Wt 283.8 lb

## 2013-10-02 DIAGNOSIS — I5023 Acute on chronic systolic (congestive) heart failure: Secondary | ICD-10-CM

## 2013-10-02 DIAGNOSIS — I519 Heart disease, unspecified: Secondary | ICD-10-CM

## 2013-10-02 DIAGNOSIS — I429 Cardiomyopathy, unspecified: Secondary | ICD-10-CM

## 2013-10-02 DIAGNOSIS — I4892 Unspecified atrial flutter: Secondary | ICD-10-CM

## 2013-10-02 DIAGNOSIS — I447 Left bundle-branch block, unspecified: Secondary | ICD-10-CM

## 2013-10-02 NOTE — Patient Instructions (Signed)
Your physician has requested that you have an echocardiogram. Echocardiography is a painless test that uses sound waves to create images of your heart. It provides your doctor with information about the size and shape of your heart and how well your heart's chambers and valves are working. This procedure takes approximately one hour. There are no restrictions for this procedure.  Your physician recommends that you continue on your current medications as directed. Please refer to the Current Medication list given to you today.  

## 2013-10-02 NOTE — Progress Notes (Signed)
PCP: [name], MD Primary Cardiologist:  [name] [name] is a 53 y.o. male who presents today for electrophysiology evaluation.  I have not seen him since 2013.  He has done reasonably well since that time but continues to have difficulty with fluid retention and SOB.   He has intermittent edema.  He is unaware of any atrial arrhythmias since his atrial flutter ablation by me.  Today, he denies symptoms of palpitations, chest pain, dizziness, presyncope, or syncope.  The patient is otherwise without complaint today.   Past Medical History  Diagnosis Date  . Hypertension   . Morbid obesity   . Atrial flutter  10/01/2006, 12/30/11    Status post RF ablation, 7/13, Dr. [name]  . Type II or unspecified type diabetes mellitus without mention of complication, not stated as uncontrolled   . OSA (obstructive sleep apnea)     compliant with CPAP  . Nonischemic cardiomyopathy     Normal coronary arteries, 2008  . Chronic systolic dysfunction of left ventricle     EF 20-25%, global HK; mild RVD, 2-D echo, 5/13  . Unspecified disorder of kidney and ureter   . RBBB, anterior fascicular block and incomplete LBBB   . Pulmonary hypertension     RVSP 50-55 mmHg, 5/13  . Valvular heart disease     Mild MR/TR, 2-D echo, 5/13   Past Surgical History  Procedure Laterality Date  . None      Current Outpatient Prescriptions  Medication Sig Dispense Refill  . allopurinol (ZYLOPRIM) 300 MG tablet Take 300 mg by mouth daily.      [name] atorvastatin (LIPITOR) 10 MG tablet Take 10 mg by mouth daily.        . Canagliflozin (INVOKANA) 300 MG TABS Take 300 mg by mouth daily.      . cloNIDine (CATAPRES) 0.2 MG tablet Take 0.2 mg by mouth 2 (two) times daily.        [name] COLCRYS 0.6 MG tablet Take 0.6 mg by mouth as needed.       . digoxin (LANOXIN) 0.25 MG tablet TAKE ONE-HALF (1/2) TABLET ONCE DAILY  15 tablet  2  . gemfibrozil (LOPID) 600 MG tablet Take 600 mg by mouth 2 (two) times daily before a  meal.        . glimepiride (AMARYL) 2 MG tablet Take 2 mg by mouth daily before supper.       . insulin aspart (NOVOLOG) 100 UNIT/ML injection Inject 20 Units into the skin 2 (two) times daily before a meal.       . insulin detemir (LEVEMIR) 100 UNIT/ML injection Inject 80 Units into the skin 2 (two) times daily.       . metFORMIN (GLUCOPHAGE) 1000 MG tablet Take 1,000 mg by mouth 2 (two) times daily with a meal.      . metolazone (ZAROXOLYN) 5 MG tablet TAKE 07-03-13,07-04-13,07-05-13 THEN PRN ON 07-06-13  30 tablet  6  . metoprolol (LOPRESSOR) 100 MG tablet Take 100 mg by mouth 2 (two) times daily.        . prazosin (MINIPRESS) 1 MG capsule Take 1 capsule (1 mg total) by mouth 2 (two) times daily.  60 capsule  6  . sitaGLIPtin (JANUVIA) 100 MG tablet Take 100 mg by mouth daily.      [name] spironolactone (ALDACTONE) 25 MG tablet Take 1 tablet (25 mg total) by mouth daily.  30 tablet  6  . torsemide (DEMADEX) 20 MG tablet Take 2  tablets (40 mg total) by mouth every morning. & 20 mg in the evening  90 tablet  3  . valsartan (DIOVAN) 160 MG tablet Take 160 mg by mouth daily.       No current facility-administered medications for this visit.    Physical Exam: Filed Vitals:   10/02/13 1132  BP: 122/70  Pulse: 70  Height: 5\' 11"  (1.803 m)  Weight: 283 lb 12.8 oz (128.731 kg)  SpO2: 97%    GEN- The patient is well appearing, alert and oriented x 3 today.   Head- normocephalic, atraumatic Eyes-  Sclera clear, conjunctiva pink Ears- hearing intact Oropharynx- clear Lungs- Clear to ausculation bilaterally, normal work of breathing Heart- Regular rate and rhythm, no murmurs, rubs or gallops, PMI not laterally displaced GI- soft, NT, ND, + BS Extremities- no clubbing, cyanosis, +1 edema Neuro- strength/sensation are intact  ekg 09/04/2013 reveals sinus rhythm 70 bpm with PR 224 and LBBB (QRS 174 msec) Echo 2013 reviewed Dr Court Joy note is also reviewed  Assessment and Plan:  The  patient has a nonichemic CM (EF 25%), NYHA Class III CHF, and LBBB.  His EF has not been assessed since 2013 however.  He has been treated with an optimal medical regimen since that time.  At this time, we should reassess his EF.  If his EF remains < 35% then I would advise CRT-D implant at that time.  Today, we had a long discussion about potential BIV ICD implant.  Risks, benefits, alternatives to the procedure were discussed in detail with the patient today. The patient  understands that the risks include but are not limited to bleeding, infection, pneumothorax, perforation, tamponade, vascular damage, renal failure, MI, stroke, death, inappropriate shocks, and lead dislodgement and wishes to proceed if his EF is still depressed.  He will therefore have an echo in the next few days and then can be scheduled for the procedure if EF is <35%

## 2013-10-05 ENCOUNTER — Other Ambulatory Visit (INDEPENDENT_AMBULATORY_CARE_PROVIDER_SITE_OTHER): Payer: Medicare Other

## 2013-10-05 ENCOUNTER — Other Ambulatory Visit: Payer: Self-pay | Admitting: Cardiovascular Disease

## 2013-10-05 ENCOUNTER — Other Ambulatory Visit: Payer: Self-pay

## 2013-10-05 DIAGNOSIS — I4892 Unspecified atrial flutter: Secondary | ICD-10-CM

## 2013-10-05 DIAGNOSIS — I429 Cardiomyopathy, unspecified: Secondary | ICD-10-CM

## 2013-10-05 DIAGNOSIS — I5023 Acute on chronic systolic (congestive) heart failure: Secondary | ICD-10-CM

## 2013-10-05 DIAGNOSIS — I519 Heart disease, unspecified: Secondary | ICD-10-CM

## 2013-10-10 ENCOUNTER — Other Ambulatory Visit: Payer: Self-pay | Admitting: Physician Assistant

## 2013-10-19 ENCOUNTER — Telehealth: Payer: Self-pay | Admitting: *Deleted

## 2013-10-19 DIAGNOSIS — I519 Heart disease, unspecified: Secondary | ICD-10-CM

## 2013-10-19 DIAGNOSIS — I429 Cardiomyopathy, unspecified: Secondary | ICD-10-CM

## 2013-10-19 NOTE — Telephone Encounter (Signed)
I left a message on the patient's home and cell numbers to please call the [place] office to discuss echo results since it looks as though he has not been notified yet. We can then proceed with scheduling him for his Bi-V ICD. I asked on the message if he calls back today to ask for Nira Conn otherwise to ask for [place].

## 2013-10-20 ENCOUNTER — Encounter (HOSPITAL_COMMUNITY): Payer: Self-pay | Admitting: Pharmacy Technician

## 2013-10-20 ENCOUNTER — Encounter: Payer: Self-pay | Admitting: *Deleted

## 2013-10-20 NOTE — Telephone Encounter (Signed)
Lab orders printed & faxed to [facility].

## 2013-10-20 NOTE — Telephone Encounter (Signed)
I have returned call to pt & he is aware of his echo results & needs to be scheduled for bi-v ICD placement by Dr. [name].  I have spoken with Baker Janus in the [place] office. She will fax the lab orders to [facility]  Pt is aware of the date 10/27/13 & time 2:00pm of the Bi-V ICD placement Letter of instructions given to pt over the phone & mailed to him.   I have asked him to have lab work drawn Monday or Tuesday next week & to stop by the [place] office to go over his instructions again if he needs this. Reassurance given   I am also forwarding this, as she is aware, to the [place] office. Horton Chin RN

## 2013-10-20 NOTE — Telephone Encounter (Signed)
Follow up   ° ° °Calling for echo test results.   °

## 2013-10-23 ENCOUNTER — Telehealth: Payer: Self-pay | Admitting: *Deleted

## 2013-10-23 NOTE — Telephone Encounter (Signed)
Received call this morning (11:32) regarding panic glucose value on patient - glucose 44.    Attempted to call patient at 12:09 on cell - left message.  Attempted to reach patient again at 2:47 on home number - left message.

## 2013-10-23 NOTE — Telephone Encounter (Signed)
Patient returned call - stated he was fasting this morning for his labs.  Stated he ate after that & has felt fine since.

## 2013-10-24 ENCOUNTER — Telehealth: Payer: Self-pay | Admitting: Internal Medicine

## 2013-10-24 NOTE — Telephone Encounter (Signed)
New message     Patient calling for instruction for ICD placement on Friday.

## 2013-10-25 NOTE — Telephone Encounter (Signed)
Spoke with patient and he is aware of instructions and where to go for procedure

## 2013-10-26 ENCOUNTER — Encounter: Payer: Self-pay | Admitting: Internal Medicine

## 2013-10-26 MED ORDER — DEXTROSE 5 % IV SOLN
3.0000 g | INTRAVENOUS | Status: DC
  Filled 2013-10-26: qty 3000

## 2013-10-26 MED ORDER — SODIUM CHLORIDE 0.9 % IR SOLN
80.0000 mg | Status: DC
  Filled 2013-10-26: qty 2

## 2013-10-27 ENCOUNTER — Ambulatory Visit (HOSPITAL_COMMUNITY)
Admission: RE | Admit: 2013-10-27 | Discharge: 2013-10-28 | Disposition: A | Discharge status: Home / Self Care | Payer: Medicare Other | Source: Ambulatory Visit | Attending: Internal Medicine | Admitting: Internal Medicine

## 2013-10-27 ENCOUNTER — Encounter (HOSPITAL_COMMUNITY): Payer: Self-pay | Admitting: *Deleted

## 2013-10-27 ENCOUNTER — Encounter (HOSPITAL_COMMUNITY)
Admission: RE | Disposition: A | Discharge status: Home / Self Care | Payer: Self-pay | Source: Ambulatory Visit | Attending: Internal Medicine

## 2013-10-27 DIAGNOSIS — G4733 Obstructive sleep apnea (adult) (pediatric): Secondary | ICD-10-CM | POA: Insufficient documentation

## 2013-10-27 DIAGNOSIS — I519 Heart disease, unspecified: Secondary | ICD-10-CM | POA: Diagnosis present

## 2013-10-27 DIAGNOSIS — I2789 Other specified pulmonary heart diseases: Secondary | ICD-10-CM | POA: Insufficient documentation

## 2013-10-27 DIAGNOSIS — Z79899 Other long term (current) drug therapy: Secondary | ICD-10-CM | POA: Insufficient documentation

## 2013-10-27 DIAGNOSIS — I429 Cardiomyopathy, unspecified: Secondary | ICD-10-CM | POA: Diagnosis present

## 2013-10-27 DIAGNOSIS — I5022 Chronic systolic (congestive) heart failure: Secondary | ICD-10-CM

## 2013-10-27 DIAGNOSIS — I447 Left bundle-branch block, unspecified: Secondary | ICD-10-CM | POA: Diagnosis present

## 2013-10-27 DIAGNOSIS — I509 Heart failure, unspecified: Secondary | ICD-10-CM | POA: Insufficient documentation

## 2013-10-27 DIAGNOSIS — I4892 Unspecified atrial flutter: Secondary | ICD-10-CM | POA: Insufficient documentation

## 2013-10-27 DIAGNOSIS — Z4502 Encounter for adjustment and management of automatic implantable cardiac defibrillator: Secondary | ICD-10-CM | POA: Insufficient documentation

## 2013-10-27 DIAGNOSIS — E119 Type 2 diabetes mellitus without complications: Secondary | ICD-10-CM | POA: Insufficient documentation

## 2013-10-27 DIAGNOSIS — I428 Other cardiomyopathies: Secondary | ICD-10-CM

## 2013-10-27 DIAGNOSIS — I4891 Unspecified atrial fibrillation: Secondary | ICD-10-CM | POA: Diagnosis present

## 2013-10-27 DIAGNOSIS — I10 Essential (primary) hypertension: Secondary | ICD-10-CM | POA: Insufficient documentation

## 2013-10-27 HISTORY — PX: BI-VENTRICULAR IMPLANTABLE CARDIOVERTER DEFIBRILLATOR: SHX5459

## 2013-10-27 HISTORY — PX: BI-VENTRICULAR IMPLANTABLE CARDIOVERTER DEFIBRILLATOR  (CRT-D): SHX5747

## 2013-10-27 LAB — CBC
HCT: 37.1 % — ABNORMAL LOW (ref 39.0–52.0)
Hemoglobin: 12.2 g/dL — ABNORMAL LOW (ref 13.0–17.0)
MCH: 29.8 pg (ref 26.0–34.0)
MCHC: 32.9 g/dL (ref 30.0–36.0)
MCV: 90.5 fL (ref 78.0–100.0)
PLATELETS: 253 10*3/uL (ref 150–400)
RBC: 4.1 MIL/uL — ABNORMAL LOW (ref 4.22–5.81)
RDW: 17.7 % — ABNORMAL HIGH (ref 11.5–15.5)
WBC: 7.1 10*3/uL (ref 4.0–10.5)

## 2013-10-27 LAB — PROTIME-INR
INR: 1.15 (ref 0.00–1.49)
Prothrombin Time: 14.5 seconds (ref 11.6–15.2)

## 2013-10-27 LAB — SURGICAL PCR SCREEN
MRSA, PCR: POSITIVE — AB
Staphylococcus aureus: POSITIVE — AB

## 2013-10-27 LAB — GLUCOSE, CAPILLARY
Glucose-Capillary: 152 mg/dL — ABNORMAL HIGH (ref 70–99)
Glucose-Capillary: 111 mg/dL — ABNORMAL HIGH (ref 70–99)

## 2013-10-27 SURGERY — BI-VENTRICULAR IMPLANTABLE CARDIOVERTER DEFIBRILLATOR  (CRT-D)
Anesthesia: LOCAL

## 2013-10-27 MED ORDER — MIDAZOLAM HCL 5 MG/5ML IJ SOLN
INTRAMUSCULAR | Status: AC
  Filled 2013-10-27: qty 5

## 2013-10-27 MED ORDER — SODIUM CHLORIDE 0.9 % IV SOLN: 250.0000 mL | INTRAVENOUS | Status: DC | PRN

## 2013-10-27 MED ORDER — MUPIROCIN 2 % EX OINT
TOPICAL_OINTMENT | Freq: Two times a day (BID) | CUTANEOUS | Status: DC
  Filled 2013-10-27: qty 22

## 2013-10-27 MED ORDER — GLIMEPIRIDE 2 MG PO TABS
2.0000 mg | ORAL_TABLET | Freq: Two times a day (BID) | ORAL | Status: DC
  Administered 2013-10-28: 2 mg via ORAL
  Filled 2013-10-27 (×3): qty 1

## 2013-10-27 MED ORDER — CHLORHEXIDINE GLUCONATE 4 % EX LIQD
60.0000 mL | Freq: Once | CUTANEOUS | Status: DC
  Filled 2013-10-27: qty 60

## 2013-10-27 MED ORDER — ALLOPURINOL 300 MG PO TABS
300.0000 mg | ORAL_TABLET | Freq: Every day | ORAL | Status: DC
  Administered 2013-10-28: 300 mg via ORAL
  Filled 2013-10-27: qty 1

## 2013-10-27 MED ORDER — HYDROCODONE-ACETAMINOPHEN 5-325 MG PO TABS: 1.0000 | ORAL_TABLET | ORAL | Status: DC | PRN

## 2013-10-27 MED ORDER — IRBESARTAN 150 MG PO TABS
150.0000 mg | ORAL_TABLET | Freq: Every day | ORAL | Status: DC
  Administered 2013-10-28: 150 mg via ORAL
  Filled 2013-10-27: qty 1

## 2013-10-27 MED ORDER — INSULIN ASPART 100 UNIT/ML ~~LOC~~ SOLN: 20.0000 [IU] | Freq: Two times a day (BID) | SUBCUTANEOUS | Status: DC

## 2013-10-27 MED ORDER — SODIUM CHLORIDE 0.9 % IV SOLN
INTRAVENOUS | Status: DC
  Administered 2013-10-27: 50 mL/h via INTRAVENOUS

## 2013-10-27 MED ORDER — SPIRONOLACTONE 25 MG PO TABS
25.0000 mg | ORAL_TABLET | Freq: Every day | ORAL | Status: DC
  Administered 2013-10-28: 25 mg via ORAL
  Filled 2013-10-27: qty 1

## 2013-10-27 MED ORDER — CLONIDINE HCL 0.2 MG PO TABS
0.2000 mg | ORAL_TABLET | Freq: Two times a day (BID) | ORAL | Status: DC
  Administered 2013-10-27 – 2013-10-28 (×2): 0.2 mg via ORAL
  Filled 2013-10-27 (×3): qty 1

## 2013-10-27 MED ORDER — LIDOCAINE HCL (PF) 1 % IJ SOLN
INTRAMUSCULAR | Status: AC
  Filled 2013-10-27: qty 60

## 2013-10-27 MED ORDER — LINAGLIPTIN 5 MG PO TABS
5.0000 mg | ORAL_TABLET | Freq: Every day | ORAL | Status: DC
  Administered 2013-10-28: 5 mg via ORAL
  Filled 2013-10-27: qty 1

## 2013-10-27 MED ORDER — DIGOXIN 125 MCG PO TABS
0.1250 mg | ORAL_TABLET | Freq: Every day | ORAL | Status: DC
  Administered 2013-10-27 – 2013-10-28 (×2): 0.125 mg via ORAL
  Filled 2013-10-27 (×3): qty 1

## 2013-10-27 MED ORDER — ATORVASTATIN CALCIUM 10 MG PO TABS
10.0000 mg | ORAL_TABLET | Freq: Every day | ORAL | Status: DC
  Administered 2013-10-28: 10 mg via ORAL
  Filled 2013-10-27: qty 1

## 2013-10-27 MED ORDER — INSULIN DETEMIR 100 UNIT/ML ~~LOC~~ SOLN
100.0000 [IU] | Freq: Two times a day (BID) | SUBCUTANEOUS | Status: DC
  Administered 2013-10-27 – 2013-10-28 (×2): 100 [IU] via SUBCUTANEOUS
  Filled 2013-10-27 (×3): qty 1

## 2013-10-27 MED ORDER — PRAZOSIN HCL 1 MG PO CAPS
1.0000 mg | ORAL_CAPSULE | Freq: Two times a day (BID) | ORAL | Status: DC
  Administered 2013-10-27 – 2013-10-28 (×2): 1 mg via ORAL
  Filled 2013-10-27 (×3): qty 1

## 2013-10-27 MED ORDER — GEMFIBROZIL 600 MG PO TABS
600.0000 mg | ORAL_TABLET | Freq: Two times a day (BID) | ORAL | Status: DC
  Administered 2013-10-28: 600 mg via ORAL
  Filled 2013-10-27 (×3): qty 1

## 2013-10-27 MED ORDER — ACETAMINOPHEN 325 MG PO TABS: 325.0000 mg | ORAL_TABLET | ORAL | Status: DC | PRN

## 2013-10-27 MED ORDER — SODIUM CHLORIDE 0.9 % IJ SOLN: 3.0000 mL | INTRAMUSCULAR | Status: DC | PRN

## 2013-10-27 MED ORDER — ONDANSETRON HCL 4 MG/2ML IJ SOLN: 4.0000 mg | Freq: Four times a day (QID) | INTRAMUSCULAR | Status: DC | PRN

## 2013-10-27 MED ORDER — FENTANYL CITRATE 0.05 MG/ML IJ SOLN
INTRAMUSCULAR | Status: AC
  Filled 2013-10-27: qty 2

## 2013-10-27 MED ORDER — CEFAZOLIN SODIUM 1-5 GM-% IV SOLN
1.0000 g | Freq: Four times a day (QID) | INTRAVENOUS | Status: AC
  Administered 2013-10-27 – 2013-10-28 (×3): 1 g via INTRAVENOUS
  Filled 2013-10-27 (×3): qty 50

## 2013-10-27 MED ORDER — CANAGLIFLOZIN 300 MG PO TABS
300.0000 mg | ORAL_TABLET | Freq: Every day | ORAL | Status: DC
  Administered 2013-10-27 – 2013-10-28 (×2): 300 mg via ORAL
  Filled 2013-10-27 (×2): qty 1

## 2013-10-27 MED ORDER — METOPROLOL TARTRATE 100 MG PO TABS
100.0000 mg | ORAL_TABLET | Freq: Two times a day (BID) | ORAL | Status: DC
Start: 2013-10-27 — End: 2013-10-28
  Administered 2013-10-27 – 2013-10-28 (×2): 100 mg via ORAL
  Filled 2013-10-27 (×3): qty 1

## 2013-10-27 MED ORDER — SODIUM CHLORIDE 0.9 % IJ SOLN
3.0000 mL | Freq: Two times a day (BID) | INTRAMUSCULAR | Status: DC
  Administered 2013-10-27 – 2013-10-28 (×2): 3 mL via INTRAVENOUS

## 2013-10-27 MED ORDER — MUPIROCIN 2 % EX OINT
TOPICAL_OINTMENT | CUTANEOUS | Status: AC
  Filled 2013-10-27: qty 22

## 2013-10-27 MED ORDER — COLCHICINE 0.6 MG PO TABS
0.6000 mg | ORAL_TABLET | Freq: Every day | ORAL | Status: DC | PRN
  Filled 2013-10-27: qty 1

## 2013-10-27 NOTE — Op Note (Signed)
SURGEON: Thompson Grayer, MD   PREPROCEDURE DIAGNOSES:  1. Nonischemic cardiomyopathy.  2. New York Heart Association class III, heart failure chronically.  3. Left bundle-branch block.   POSTPROCEDURE DIAGNOSES:  1. Nonischemic cardiomyopathy.  2. New York Heart Association class III heart failure chronically.  3. Left bundle-branch block.   PROCEDURES:  1. Biventricular ICD implantation.   INTRODUCTION:  [name] is a 53 y.o. male with a nonischemic CM (EF 25%), NYHA Class III CHF, and LBBB QRS morophology. At this time, he meets MADIT II/ SCD-HeFT criteria for ICD implantation for primary prevention of sudden death. Given LBBB, the patient may also be expected to benefit from resynchronization therapy. The patient has been treated with an optimal medical regimen but continues to have a depressed ejection fraction and NYHA Class III CHF symptoms. He therefore presents today for a biventricular ICD implantation.   DESCRIPTION OF PROCEDURE: Informed written consent was obtained and the patient was brought to the electrophysiology lab in the fasting state. The patient was adequately sedated with intravenous Versed, and fentanyl as outlined in the nursing report. The patient's left chest was prepped and draped in the usual sterile fashion by the EP lab staff. The skin overlying the left deltopectoral region was infiltrated with lidocaine for local analgesia. A 5-cm incision was made over the left deltopectoral region. A left subcutaneous defibrillator pocket was fashioned using a combination of sharp and blunt dissection. Electrocautery was used to assure hemostasis.   RA/RV Lead Placement:  The left axillary vein was cannulated with fluoroscopic visualization. No contrast was required for this endeavor. Through the left axillary vein, a Medtronic model [id number] (serial # [id number] ) right atrial lead and a Medtronic model [id number] (serial number [id number]) right ventricular defibrillator lead  were advanced with fluoroscopic visualization into the right atrial appendage and right ventricular apex positions respectively. Initial atrial lead P-waves measured 3.3  mV with an impedance of 692 ohms and a threshold of 1.0 volts at 0.5 milliseconds. The right ventricular lead R-wave measured 9.5 mV with impedance of 620 ohms and a threshold of 1.0 volts at 0.5 milliseconds.   LV Lead Placement:  A Medtronic extended hook guide was advanced through the left axillary vein into the low lateral right atrium. A Bard curved Damato catheter was introduced through  guide and used to cannulate the coronary sinus. Coronary sinus cannulation was confirmed with electrogram recording from the hexapolar catheter. A selective coronary sinus venogram was performed by hand injection of nonionic contrast. This demonstrated a large CS body with moderate sized branches. There was a moderate sized lateral coronary sinus branch noted along the mid portion of the CS body.  There was a more shallow vein more proximally.  I elected to place the lead in the more distal branch today.  A Whisper CSJ wire was introduced through the guide and advanced into the lateral branch.  A Medtronic model 4598 - 88 (serial number [id number]) lead was advanced through the guide into the lateral branch.  The branch tapered abruptly and limited lead placement to one-thirds from the base to the apex.  This was a lateral position. In this location, the left ventricular lead R-waves measured 5.4 mV with impedance of 862 ohms and a threshold of 2.0 volt at 0.5 milliseconds in the bipolar LV2-3 pair configuration with no diaphragmatic stimulation observed when pacing at 10 volts output. The  guide was therefore removed.  All three leads were secured to the pectoralis fascia using #2 silk  suture over the suture sleeves. The pocket then irrigated with copious gentamicin solution. The leads were then connected to a Medtronic model [id number] [name] XT CRT-D  (serial Number [id number]) device. The defibrillator was placed into the pocket. The pocket was then closed in 2 layers with 2.0 Vicryl suture for the subcutaneous and subcuticular layers. Steri-Strips and a sterile dressing were then applied.  DFT testing was not performed today. The procedure was therefore considered completed. There were no early apparhent complications.   CONCLUSIONS:  1. Nonischemic cardiomyopathy with Left bundle-branch block and chronic New York Heart Association class III heart failure.  2. Successful biventricular ICD implantation.  3. No early apparent complications.

## 2013-10-27 NOTE — Discharge Summary (Signed)
ELECTROPHYSIOLOGY PROCEDURE DISCHARGE SUMMARY    Patient ID: [name],  MRN: [id number], DOB/AGE: Dec 25, 1960 53 y.o.  Admit date: 10/27/2013 Discharge date: 10/27/2013  Primary Care Physician: [name], MD Primary Cardiologist: Bronson Ing Electrophysiologist: Allred  Primary Discharge Diagnosis:  Non ischemic cardiomyopathy, class III congestive heart failure, and LBBB s/p CRTD implant this admission  Secondary Discharge Diagnosis:  1.  Hypertension 2.  Obesity 3.  Atrial flutter s/p RFCA 02-2012 4.  Diabetes 5.  Sleep apnea - on CPAP  No Known Allergies   Procedures This Admission:  1.  Implantation of a Medtronic CRTD on 10-27-2013 by [name].  The patient received a Medtronic model number [id number] ICD with model number 5076 right atrial lead, 7353 right ventricular lead, and 4598 left ventricular lead.  DFT's were deferred at time of implant.  There were no immediate post procedure complications. 2.  CXR on 10-28-13 demonstrated no pneumothorax status post device implantation.   Brief HPI: [name] is a 53 y.o. male was referred to electrophysiology in the outpatient setting for consideration of CRTD implantation.  Past medical history includes non ischemic cardiomyopathy, congestive heart failure and left bundle branch block.  The patient has persistent LV dysfunction despite guideline directed therapy.  Risks, benefits, and alternatives to ICD implantation were reviewed with the patient who wished to proceed.   Hospital Course:  The patient was admitted and underwent implantation of a Medtronic CRTD with details as outlined above.   He was monitored on telemetry overnight which demonstrated nsr with BiV pacing.  Left chest was without hematoma or ecchymosis.  The device was interrogated and found to be functioning normally.  CXR was obtained and demonstrated no pneumothorax status post device implantation.  Wound care, arm mobility, and restrictions were  reviewed with the patient.  Dr Lovena Le examined the patient and considered them stable for discharge to home.   The patient's discharge medications include an ARB (Valsartan) and beta blocker (Metoprolol).   Discharge Vitals: Blood pressure 125/78, pulse 71, temperature 97.6 F (36.4 C), temperature source Oral, resp. rate 18, height 6' (1.829 m), weight 270 lb (122.471 kg), SpO2 97.00%.  Labs:   Lab Results  Component Value Date   WBC 7.1 10/27/2013   HGB 12.2* 10/27/2013   HCT 37.1* 10/27/2013   MCV 90.5 10/27/2013   PLT 253 10/27/2013   No results found for this basename: NA, K, CL, CO2, BUN, CREATININE, CALCIUM, LABALBU, PROT, BILITOT, ALKPHOS, ALT, AST, GLUCOSE,  in the last 168 hours No results found for this basename: CKTOTAL, CKMB, CKMBINDEX, TROPONINI      Discharge Medications:    Medication List    ASK your doctor about these medications       allopurinol 300 MG tablet  Commonly known as:  ZYLOPRIM  Take 300 mg by mouth daily.     atorvastatin 10 MG tablet  Commonly known as:  LIPITOR  Take 10 mg by mouth daily.     cloNIDine 0.2 MG tablet  Commonly known as:  CATAPRES  Take 0.2 mg by mouth 2 (two) times daily.     COLCRYS 0.6 MG tablet  Generic drug:  colchicine  Take 0.6 mg by mouth daily as needed (gout).     digoxin 0.25 MG tablet  Commonly known as:  LANOXIN  Take 0.125 mg by mouth daily.     gemfibrozil 600 MG tablet  Commonly known as:  LOPID  Take 600 mg by mouth 2 (two) times  daily before a meal.     glimepiride 2 MG tablet  Commonly known as:  AMARYL  Take 2 mg by mouth 2 (two) times daily.     insulin aspart 100 UNIT/ML injection  Commonly known as:  novoLOG  Inject 20 Units into the skin 2 (two) times daily before a meal.     insulin detemir 100 UNIT/ML injection  Commonly known as:  LEVEMIR  Inject 100 Units into the skin 2 (two) times daily.     INVOKANA 300 MG Tabs  Generic drug:  Canagliflozin  Take 300 mg by mouth daily.      metFORMIN 1000 MG tablet  Commonly known as:  GLUCOPHAGE  Take 1,000 mg by mouth 2 (two) times daily with a meal.     metoprolol 100 MG tablet  Commonly known as:  LOPRESSOR  Take 100 mg by mouth 2 (two) times daily.     prazosin 1 MG capsule  Commonly known as:  MINIPRESS  Take 1 mg by mouth 2 (two) times daily.     sitaGLIPtin 100 MG tablet  Commonly known as:  JANUVIA  Take 100 mg by mouth daily.     spironolactone 25 MG tablet  Commonly known as:  ALDACTONE  Take 1 tablet (25 mg total) by mouth daily.     torsemide 20 MG tablet  Commonly known as:  DEMADEX  Take 20-40 mg by mouth 2 (two) times daily. Take 40mg  in the morning and 20mg  in the evening.     valsartan 160 MG tablet  Commonly known as:  DIOVAN  Take 160 mg by mouth daily.        Disposition:       Future Appointments Provider Department Dept Phone   11/08/2013 2:00 PM [facility] [place] [facility] [phone]   12/06/2013 10:00 AM Herminio Commons, [place] [phone]   02/05/2014 10:00 AM Thompson Grayer, MD [place] [phone]       Duration of Discharge Encounter: Greater than 30 minutes including physician time.  Signed,  Mikle Bosworth.D.

## 2013-10-27 NOTE — Interval H&P Note (Signed)
History and Physical Interval Note:  10/27/2013 2:31 PM  [name]  has presented today for surgery, with the diagnosis of cm  The various methods of treatment have been discussed with the patient and family. After consideration of risks, benefits and other options for treatment, the patient has consented to  Procedure(s): BI-VENTRICULAR IMPLANTABLE CARDIOVERTER DEFIBRILLATOR  (CRT-D) (N/A) as a surgical intervention .  The patient's history has been reviewed, patient examined, no change in status, stable for surgery.  I have reviewed the patient's chart and labs.  Questions were answered to the patient's satisfaction.    ICD Criteria  Current LVEF: 25% ;Obtained > 6 months ago.   NYHA Functional Classification: Class III  Heart Failure History:  Yes, Duration of heart failure since onset is > 9 months  Non-Ischemic Dilated Cardiomyopathy History:  Yes, timeframe is > 9 months  Atrial Fibrillation/Atrial Flutter:  Yes, A-Fib/A-Flutter type: Paroxysmal.  Ventricular Tachycardia History:  No.  Cardiac Arrest History:  No  History of Syndromes with Risk of Sudden Death:  No.  Previous ICD:  No.  Electrophysiology Study: No.  Prior MI: No.  PPM: No.  OSA:  No  Patient Life Expectancy of >=1 year: Yes.  Anticoagulation Therapy:  Patient is NOT on anticoagulation therapy.   Beta Blocker Therapy:  Yes.   Ace Inhibitor/ARB Therapy:  Yes.   Thompson Grayer

## 2013-10-27 NOTE — H&P (View-Only) (Signed)
PCP: [name], MD Primary Cardiologist:  [name] [name] is a 53 y.o. male who presents today for electrophysiology evaluation.  I have not seen him since 2013.  He has done reasonably well since that time but continues to have difficulty with fluid retention and SOB.   He has intermittent edema.  He is unaware of any atrial arrhythmias since his atrial flutter ablation by me.  Today, he denies symptoms of palpitations, chest pain, dizziness, presyncope, or syncope.  The patient is otherwise without complaint today.   Past Medical History  Diagnosis Date  . Hypertension   . Morbid obesity   . Atrial flutter  10/01/2006, 12/30/11    Status post RF ablation, 7/13, Dr. [name]  . Type II or unspecified type diabetes mellitus without mention of complication, not stated as uncontrolled   . OSA (obstructive sleep apnea)     compliant with CPAP  . Nonischemic cardiomyopathy     Normal coronary arteries, 2008  . Chronic systolic dysfunction of left ventricle     EF 20-25%, global HK; mild RVD, 2-D echo, 5/13  . Unspecified disorder of kidney and ureter   . RBBB, anterior fascicular block and incomplete LBBB   . Pulmonary hypertension     RVSP 50-55 mmHg, 5/13  . Valvular heart disease     Mild MR/TR, 2-D echo, 5/13   Past Surgical History  Procedure Laterality Date  . None      Current Outpatient Prescriptions  Medication Sig Dispense Refill  . allopurinol (ZYLOPRIM) 300 MG tablet Take 300 mg by mouth daily.      [name] atorvastatin (LIPITOR) 10 MG tablet Take 10 mg by mouth daily.        . Canagliflozin (INVOKANA) 300 MG TABS Take 300 mg by mouth daily.      . cloNIDine (CATAPRES) 0.2 MG tablet Take 0.2 mg by mouth 2 (two) times daily.        [name] COLCRYS 0.6 MG tablet Take 0.6 mg by mouth as needed.       . digoxin (LANOXIN) 0.25 MG tablet TAKE ONE-HALF (1/2) TABLET ONCE DAILY  15 tablet  2  . gemfibrozil (LOPID) 600 MG tablet Take 600 mg by mouth 2 (two) times daily before a  meal.        . glimepiride (AMARYL) 2 MG tablet Take 2 mg by mouth daily before supper.       . insulin aspart (NOVOLOG) 100 UNIT/ML injection Inject 20 Units into the skin 2 (two) times daily before a meal.       . insulin detemir (LEVEMIR) 100 UNIT/ML injection Inject 80 Units into the skin 2 (two) times daily.       . metFORMIN (GLUCOPHAGE) 1000 MG tablet Take 1,000 mg by mouth 2 (two) times daily with a meal.      . metolazone (ZAROXOLYN) 5 MG tablet TAKE 07-03-13,07-04-13,07-05-13 THEN PRN ON 07-06-13  30 tablet  6  . metoprolol (LOPRESSOR) 100 MG tablet Take 100 mg by mouth 2 (two) times daily.        . prazosin (MINIPRESS) 1 MG capsule Take 1 capsule (1 mg total) by mouth 2 (two) times daily.  60 capsule  6  . sitaGLIPtin (JANUVIA) 100 MG tablet Take 100 mg by mouth daily.      [name] spironolactone (ALDACTONE) 25 MG tablet Take 1 tablet (25 mg total) by mouth daily.  30 tablet  6  . torsemide (DEMADEX) 20 MG tablet Take 2  tablets (40 mg total) by mouth every morning. & 20 mg in the evening  90 tablet  3  . valsartan (DIOVAN) 160 MG tablet Take 160 mg by mouth daily.       No current facility-administered medications for this visit.    Physical Exam: Filed Vitals:   10/02/13 1132  BP: 122/70  Pulse: 70  Height: 5' 11" (1.803 m)  Weight: 283 lb 12.8 oz (128.731 kg)  SpO2: 97%    GEN- The patient is well appearing, alert and oriented x 3 today.   Head- normocephalic, atraumatic Eyes-  Sclera clear, conjunctiva pink Ears- hearing intact Oropharynx- clear Lungs- Clear to ausculation bilaterally, normal work of breathing Heart- Regular rate and rhythm, no murmurs, rubs or gallops, PMI not laterally displaced GI- soft, NT, ND, + BS Extremities- no clubbing, cyanosis, +1 edema Neuro- strength/sensation are intact  ekg 09/04/2013 reveals sinus rhythm 70 bpm with PR 224 and LBBB (QRS 174 msec) Echo 2013 reviewed Dr Koneswaran's note is also reviewed  Assessment and Plan:  The  patient has a nonichemic CM (EF 25%), NYHA Class III CHF, and LBBB.  His EF has not been assessed since 2013 however.  He has been treated with an optimal medical regimen since that time.  At this time, we should reassess his EF.  If his EF remains < 35% then I would advise CRT-D implant at that time.  Today, we had a long discussion about potential BIV ICD implant.  Risks, benefits, alternatives to the procedure were discussed in detail with the patient today. The patient  understands that the risks include but are not limited to bleeding, infection, pneumothorax, perforation, tamponade, vascular damage, renal failure, MI, stroke, death, inappropriate shocks, and lead dislodgement and wishes to proceed if his EF is still depressed.  He will therefore have an echo in the next few days and then can be scheduled for the procedure if EF is <35% 

## 2013-10-27 NOTE — Progress Notes (Signed)
  Called by nurse due to patient having HR 140 bpm.   Patient with NICM, LBBB and AFL s/p ablation. Underwent elective ICD placement today. Developed brief palpitations. No CP or SOB. Tele with several brief episodes of AF with RVR. Now back in NSR with v-pacing.   Patient says he used to be on coumadin but no longer on blood thinners - suspect he was taken off after ablation. CHADS2 = 3. Would consider initiation of NOAC as outpatient.   Carl Rawl,MD 9:02 PM

## 2013-10-28 ENCOUNTER — Ambulatory Visit (HOSPITAL_COMMUNITY): Payer: Medicare Other

## 2013-10-28 DIAGNOSIS — Z95810 Presence of automatic (implantable) cardiac defibrillator: Secondary | ICD-10-CM

## 2013-10-28 DIAGNOSIS — I428 Other cardiomyopathies: Secondary | ICD-10-CM

## 2013-10-28 LAB — BASIC METABOLIC PANEL
BUN: 25 mg/dL — AB (ref 6–23)
CHLORIDE: 105 meq/L (ref 96–112)
CO2: 23 mEq/L (ref 19–32)
CREATININE: 0.9 mg/dL (ref 0.50–1.35)
Calcium: 9.1 mg/dL (ref 8.4–10.5)
GFR calc non Af Amer: >90 mL/min (ref >90)
Glucose, Bld: 87 mg/dL (ref 70–99)
Potassium: 3.8 mEq/L (ref 3.7–5.3)
Sodium: 142 mEq/L (ref 137–147)

## 2013-10-28 LAB — GLUCOSE, CAPILLARY
Glucose-Capillary: 82 mg/dL (ref 70–99)
Glucose-Capillary: 163 mg/dL — ABNORMAL HIGH (ref 70–99)

## 2013-10-28 MED ORDER — METFORMIN HCL 1000 MG PO TABS: 1000.0000 mg | ORAL_TABLET | Freq: Two times a day (BID) | ORAL | Status: DC

## 2013-10-28 NOTE — Discharge Instructions (Signed)
PLEASE TAKE ALL NEW MEDICATIONS/MEDICATION CHANGES AS PRESCRIBED.   PLEASE ATTEND ALL SCHEDULED/RECOMMENDED FOLLOW-UP APPOINTMENTS.   Supplemental Discharge Instructions for  Defibrillator Patients  Activity No heavy lifting or vigorous activity with your left/right arm for 6 to 8 weeks.  Do not raise your left/right arm above your head for one week.  Gradually raise your affected arm as drawn below.          10/31/13                      11/01/13                   11/02/13                    11/03/13  NO DRIVING for 1 week; you may begin driving on 04/30/77. WOUND CARE   Keep the wound area clean and dry.  Do not get this area wet for one week. No showers for one week; you may shower on                    11/03/13.   The tape/steri-strips on your wound will fall off; do not pull them off.  No bandage is needed on the site.  DO  NOT apply any creams, oils, or ointments to the wound area.   If you notice any drainage or discharge from the wound, any swelling or bruising at the site, or you develop a fever > 101? F after you are discharged home, call the office at once.  Special Instructions   You are still able to use cellular telephones; use the ear opposite the side where you have your pacemaker/defibrillator.  Avoid carrying your cellular phone near your device.   When traveling through airports, show security personnel your identification card to avoid being screened in the metal detectors.  Ask the security personnel to use the hand wand.   Avoid arc welding equipment, MRI testing (magnetic resonance imaging), TENS units (transcutaneous nerve stimulators).  Call the office for questions about other devices.   Avoid electrical appliances that are in poor condition or are not properly grounded.   Microwave ovens are safe to be near or to operate.  Additional information for defibrillator patients should your device go off:   If your device goes off ONCE and you feel fine afterward, notify the  device clinic nurses.   If your device goes off ONCE and you do not feel well afterward, call 911.   If your device goes off TWICE, call 911.   If your device goes off THREE times in one day, call 911.  DO NOT DRIVE YOURSELF OR A FAMILY MEMBER WITH A DEFIBRILLATOR TO THE HOSPITAL--CALL 911.

## 2013-10-28 NOTE — Progress Notes (Signed)
Patient ID: [name], male   DOB: 10-Jul-1961, 53 y.o.   MRN: [id number]   Patient Name: [name] Date of Encounter: 10/28/2013     Active Problems:   CARDIOMYOPATHY, SECONDARY   Atrial fibrillation   Chronic systolic dysfunction of left ventricle   LBBB (left bundle branch block)    SUBJECTIVE  S/p ICD implant, note burst of atrial fib/flutter last night. No chest pain or sob.  CURRENT MEDS . allopurinol  300 mg Oral Daily  . atorvastatin  10 mg Oral Daily  . Canagliflozin  300 mg Oral Daily  .  ceFAZolin (ANCEF) IV  1 g Intravenous Q6H  . cloNIDine  0.2 mg Oral BID  . digoxin  0.125 mg Oral Daily  . gemfibrozil  600 mg Oral BID AC  . glimepiride  2 mg Oral BID WC  . insulin aspart  20 Units Subcutaneous BID AC  . insulin detemir  100 Units Subcutaneous BID  . irbesartan  150 mg Oral Daily  . linagliptin  5 mg Oral Daily  . metoprolol  100 mg Oral BID  . prazosin  1 mg Oral BID  . sodium chloride  3 mL Intravenous Q12H  . spironolactone  25 mg Oral Daily    OBJECTIVE  Filed Vitals:   10/27/13 1136 10/27/13 1845 10/27/13 2108 10/28/13 0435  BP: 125/78 128/90 135/87 127/79  Pulse: 71 133 93 72  Temp: 97.6 F (36.4 C) 97.7 F (36.5 C) 97.7 F (36.5 C) 98 F (36.7 C)  TempSrc: Oral Oral Oral Oral  Resp: 18 18 18 18   Height: 6' (1.829 m)     Weight: 270 lb (122.471 kg)     SpO2: 97% 95% 96% 94%    Intake/Output Summary (Last 24 hours) at 10/28/13 0756 Last data filed at 10/27/13 2108  Gross per 24 hour  Intake      0 ml  Output    700 ml  Net   -700 ml   Filed Weights   10/27/13 1136  Weight: 270 lb (122.471 kg)    PHYSICAL EXAM  General: Pleasant, NAD. Neuro: Alert and oriented X 3. Moves all extremities spontaneously. HEENT:  Normal  Neck: Supple without bruits or JVD. Lungs:  Resp regular and unlabored, CTA. Incision without hematoma Heart: RRR no s3, s4, or murmurs. Abdomen: Soft, non-tender, non-distended, BS + x 4.   Extremities: No clubbing, cyanosis or edema. DP/PT/Radials 2+ and equal bilaterally.  Accessory Clinical Findings  CBC  Recent Labs  10/27/13 1145  WBC 7.1  HGB 12.2*  HCT 37.1*  MCV 90.5  PLT 671   Basic Metabolic Panel No results found for this basename: NA, K, CL, CO2, GLUCOSE, BUN, CREATININE, CALCIUM, MG, PHOS,  in the last 72 hours Liver Function Tests No results found for this basename: AST, ALT, ALKPHOS, BILITOT, PROT, ALBUMIN,  in the last 72 hours No results found for this basename: LIPASE, AMYLASE,  in the last 72 hours Cardiac Enzymes No results found for this basename: CKTOTAL, CKMB, CKMBINDEX, TROPONINI,  in the last 72 hours BNP No components found with this basename: POCBNP,  D-Dimer No results found for this basename: DDIMER,  in the last 72 hours Hemoglobin A1C No results found for this basename: HGBA1C,  in the last 72 hours Fasting Lipid Panel No results found for this basename: CHOL, HDL, LDLCALC, TRIG, CHOLHDL, LDLDIRECT,  in the last 72 hours Thyroid Function Tests No results found for this basename: TSH, T4TOTAL, FREET3,  T3FREE, THYROIDAB,  in the last 72 hours  TELE NSR with BiV pacing  ECG NSR with BiV Pacing  Radiology/Studies  Dg Chest 2 View  10/28/2013   CLINICAL DATA:  Status post defibrillator placement  EXAM: CHEST  2 VIEW  COMPARISON:  06/28/2013  FINDINGS: Cardiac shadow remains enlarged. A defibrillator is now seen. No pneumothorax is noted. Mild vascular congestion is again seen. No focal infiltrate is noted. No bony abnormality is seen.  IMPRESSION: No pneumothorax following defibrillator placement  Stable congestive failure.   Electronically Signed   By: Inez Catalina M.D.   On: 10/28/2013 07:40    ASSESSMENT AND PLAN 1.s/p BiV ICD implant, doing well. [facility] for discharge home with usual followup and restricitions. No change in meds.   Rea Reser,M.D.  10/28/2013 7:56 AM

## 2013-10-31 ENCOUNTER — Telehealth: Payer: Self-pay | Admitting: *Deleted

## 2013-10-31 NOTE — Telephone Encounter (Signed)
Notes Recorded by Laurine Blazer, LPN on 1/82/9937 at 16:96 AM Patient aware of low glucose already, notified day of test. He does follow with PMD already for his diabetes. Will forward copy of lab.

## 2013-10-31 NOTE — Telephone Encounter (Signed)
Message copied by Laurine Blazer on Tue Oct 31, 2013 10:16 AM ------      Message from: Merlene Laughter      Created: Tue Oct 31, 2013  9:48 AM                   ----- Message -----         From: Herminio Commons, MD         Sent: 10/27/2013   3:36 PM           To: Merlene Laughter, LPN            Glucose will need monitoring by PCP. ------

## 2013-11-08 ENCOUNTER — Encounter: Payer: Self-pay | Admitting: Internal Medicine

## 2013-11-08 ENCOUNTER — Ambulatory Visit (INDEPENDENT_AMBULATORY_CARE_PROVIDER_SITE_OTHER): Payer: Medicare Other | Admitting: *Deleted

## 2013-11-08 DIAGNOSIS — I429 Cardiomyopathy, unspecified: Secondary | ICD-10-CM

## 2013-11-08 DIAGNOSIS — I519 Heart disease, unspecified: Secondary | ICD-10-CM

## 2013-11-08 LAB — MDC_IDC_ENUM_SESS_TYPE_INCLINIC
Battery Remaining Longevity: 96 mo
Battery Voltage: 3.11 V
Brady Statistic AP VP Percent: 17.92 %
Brady Statistic RV Percent Paced: 91.82 %
Date Time Interrogation Session: 20150325140655
HIGH POWER IMPEDANCE MEASURED VALUE: 171 Ohm
HIGH POWER IMPEDANCE MEASURED VALUE: 64 Ohm
Lead Channel Pacing Threshold Amplitude: 0.75 V
Lead Channel Pacing Threshold Amplitude: 0.875 V
Lead Channel Pacing Threshold Amplitude: 0.875 V
Lead Channel Pacing Threshold Pulse Width: 0.4 ms
Lead Channel Sensing Intrinsic Amplitude: 4 mV
Lead Channel Sensing Intrinsic Amplitude: 8 mV
Lead Channel Setting Pacing Amplitude: 2.5 V
Lead Channel Setting Pacing Amplitude: 3.5 V
Lead Channel Setting Pacing Amplitude: 3.5 V
Lead Channel Setting Pacing Pulse Width: 0.4 ms
MDC IDC MSMT LEADCHNL LV PACING THRESHOLD PULSEWIDTH: 0.4 ms
MDC IDC MSMT LEADCHNL RA IMPEDANCE VALUE: 456 Ohm
MDC IDC MSMT LEADCHNL RV IMPEDANCE VALUE: 342 Ohm
MDC IDC MSMT LEADCHNL RV PACING THRESHOLD PULSEWIDTH: 0.4 ms
MDC IDC SET LEADCHNL LV PACING PULSEWIDTH: 0.4 ms
MDC IDC SET LEADCHNL RV SENSING SENSITIVITY: 0.3 mV
MDC IDC SET ZONE DETECTION INTERVAL: 300 ms
MDC IDC STAT BRADY AP VS PERCENT: 0.08 %
MDC IDC STAT BRADY AS VP PERCENT: 74 %
MDC IDC STAT BRADY AS VS PERCENT: 7.99 %
MDC IDC STAT BRADY RA PERCENT PACED: 18 %
Zone Setting Detection Interval: 350 ms
Zone Setting Detection Interval: 360 ms
Zone Setting Detection Interval: 400 ms

## 2013-11-08 NOTE — Progress Notes (Signed)
Wound check appointment. Steri-strips removed. Wound without redness or edema. Incision edges approximated, wound well healed. Normal device function. Thresholds, sensing, and impedances consistent with implant measurements. Device programmed at 3.5V for extra safety margin until 3 month visit. Histogram distribution appropriate for patient and level of activity. 1 mode switch 39 hours, - coumadin.  No ventricular arrhythmias noted. Patient educated about wound care, arm mobility, lifting restrictions, shock plan. ROV in 3 months with implanting physician.

## 2013-11-08 NOTE — Progress Notes (Signed)
Dr. [name] made aware of A-fib episode and will follow up since the patient is not on any anticoagulation therapy.

## 2013-11-13 ENCOUNTER — Telehealth: Payer: Self-pay | Admitting: Internal Medicine

## 2013-11-13 DIAGNOSIS — I4891 Unspecified atrial fibrillation: Secondary | ICD-10-CM

## 2013-11-13 NOTE — Telephone Encounter (Signed)
I left a message for the patient to call at his home and cell #'s. Recent interrogation shows that the patient has been having some a-fib, which is new for him. Per Dr. [name], the patient needs to start anticoagulation. He was previously on Pradaxa 150 mg BID prior to his a-flutter ablation. We need to confirm with the patient that he had no GI side effects on Pradaxa. If not, he will need to restart per Dr. [name].

## 2013-11-14 ENCOUNTER — Telehealth: Payer: Self-pay | Admitting: *Deleted

## 2013-11-14 MED ORDER — DABIGATRAN ETEXILATE MESYLATE 150 MG PO CAPS: 150.0000 mg | ORAL_CAPSULE | Freq: Two times a day (BID) | ORAL | Status: DC

## 2013-11-14 NOTE — Telephone Encounter (Signed)
Left message for patient to return call.

## 2013-11-14 NOTE — Telephone Encounter (Signed)
PA for PRADAXA through covermymeds for [facility], [facility], [place].

## 2013-11-14 NOTE — Telephone Encounter (Signed)
Patient aware of afib and will start Pradaxa 150mg  twice daily  I have called in the new RX for him

## 2013-11-14 NOTE — Telephone Encounter (Signed)
Follow up ° ° ° ° ° °Returning Kelly's call °

## 2013-11-21 NOTE — Telephone Encounter (Signed)
PA for patients PRADAXA approved through [place] dates 11/14/2013--11/15/2014 ID# [id number] Event # [id number] Pharmacy notified

## 2013-12-06 ENCOUNTER — Ambulatory Visit (INDEPENDENT_AMBULATORY_CARE_PROVIDER_SITE_OTHER): Payer: Medicare Other | Admitting: Cardiovascular Disease

## 2013-12-06 ENCOUNTER — Encounter: Payer: Self-pay | Admitting: Cardiovascular Disease

## 2013-12-06 VITALS — BP 137/88 | HR 60 | Ht 72.0 in | Wt 274.0 lb

## 2013-12-06 DIAGNOSIS — I509 Heart failure, unspecified: Secondary | ICD-10-CM

## 2013-12-06 DIAGNOSIS — I429 Cardiomyopathy, unspecified: Secondary | ICD-10-CM

## 2013-12-06 DIAGNOSIS — I350 Nonrheumatic aortic (valve) stenosis: Secondary | ICD-10-CM

## 2013-12-06 DIAGNOSIS — E785 Hyperlipidemia, unspecified: Secondary | ICD-10-CM

## 2013-12-06 DIAGNOSIS — I359 Nonrheumatic aortic valve disorder, unspecified: Secondary | ICD-10-CM

## 2013-12-06 DIAGNOSIS — I519 Heart disease, unspecified: Secondary | ICD-10-CM

## 2013-12-06 DIAGNOSIS — I10 Essential (primary) hypertension: Secondary | ICD-10-CM

## 2013-12-06 DIAGNOSIS — Z95810 Presence of automatic (implantable) cardiac defibrillator: Secondary | ICD-10-CM

## 2013-12-06 DIAGNOSIS — I5022 Chronic systolic (congestive) heart failure: Secondary | ICD-10-CM

## 2013-12-06 DIAGNOSIS — I872 Venous insufficiency (chronic) (peripheral): Secondary | ICD-10-CM

## 2013-12-06 NOTE — Progress Notes (Signed)
Patient ID: [name], male   DOB: 1961/07/30, 53 y.o.   MRN: [id number]      SUBJECTIVE: The patient presents for his first visit with me since undergoing CRTD (cardiac resynchronization therapy defibrillator device) in 10/2013. His echo on 2/19 showed EF 25-30%. He had atrial fibrillation in the hospital and even up to 2 weeks ago, but his palpitations have since subsided. He denies chest pain and shortness of breath. He sleeps with 2 pillows which is chronic for him. He denies increased leg swelling. He denies paroxysmal nocturnal dyspnea.    No Known Allergies  Current Outpatient Prescriptions  Medication Sig Dispense Refill  . allopurinol (ZYLOPRIM) 300 MG tablet Take 300 mg by mouth daily.      [name] atorvastatin (LIPITOR) 10 MG tablet Take 10 mg by mouth daily.        . Canagliflozin (INVOKANA) 300 MG TABS Take 300 mg by mouth daily.      . cloNIDine (CATAPRES) 0.2 MG tablet Take 0.2 mg by mouth 2 (two) times daily.        [name] COLCRYS 0.6 MG tablet Take 0.6 mg by mouth daily as needed (gout).       . dabigatran (PRADAXA) 150 MG CAPS capsule Take 1 capsule (150 mg total) by mouth 2 (two) times daily.  60 capsule  11  . digoxin (LANOXIN) 0.25 MG tablet Take 0.125 mg by mouth daily.      [name] gemfibrozil (LOPID) 600 MG tablet Take 600 mg by mouth 2 (two) times daily before a meal.        . glimepiride (AMARYL) 2 MG tablet Take 2 mg by mouth 2 (two) times daily.       . insulin aspart (NOVOLOG) 100 UNIT/ML injection Inject 30 Units into the skin 2 (two) times daily before a meal.       . insulin detemir (LEVEMIR) 100 UNIT/ML injection Inject 100 Units into the skin 2 (two) times daily.       . metFORMIN (GLUCOPHAGE) 1000 MG tablet Take 1 tablet (1,000 mg total) by mouth 2 (two) times daily with a meal.      . metoprolol (LOPRESSOR) 100 MG tablet Take 100 mg by mouth 2 (two) times daily.        . prazosin (MINIPRESS) 1 MG capsule Take 1 mg by mouth 2 (two) times daily.      . sitaGLIPtin  (JANUVIA) 100 MG tablet Take 100 mg by mouth daily.      [name] spironolactone (ALDACTONE) 25 MG tablet Take 1 tablet (25 mg total) by mouth daily.  30 tablet  6  . torsemide (DEMADEX) 20 MG tablet Take 20-40 mg by mouth 2 (two) times daily. Take 40mg  in the morning and 20mg  in the evening.      . valsartan (DIOVAN) 160 MG tablet Take 160 mg by mouth daily.       No current facility-administered medications for this visit.    Past Medical History  Diagnosis Date  . Hypertension   . Morbid obesity   . Atrial flutter  10/01/2006, 12/30/11    Status post RF ablation, 7/13, Dr. [name]  . Type II or unspecified type diabetes mellitus without mention of complication, not stated as uncontrolled   . OSA (obstructive sleep apnea)     compliant with CPAP  . Nonischemic cardiomyopathy     Normal coronary arteries, 2008  . Chronic systolic dysfunction of left ventricle     EF 20-25%, global HK;  mild RVD, 2-D echo, 5/13  . Unspecified disorder of kidney and ureter   . RBBB, anterior fascicular block and incomplete LBBB   . Pulmonary hypertension     RVSP 50-55 mmHg, 5/13  . Valvular heart disease     Mild MR/TR, 2-D echo, 5/13    Past Surgical History  Procedure Laterality Date  . Bi-ventricular implantable cardioverter defibrillator  (crt-d)  10/27/2013    MDT [name] XT CRTD implanted by [name] for NICM, CHF    History   Social History  . Marital Status: Married    Spouse Name: N/A    Number of Children: N/A  . Years of Education: N/A   Occupational History  . DISABLED    Social History Main Topics  . Smoking status: Never Smoker   . Smokeless tobacco: Never Used  . Alcohol Use: No  . Drug Use: No  . Sexual Activity: Not on file   Other Topics Concern  . Not on file   Social History Narrative   Pt lives in [place] alone.  Disabled.  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     Filed Vitals:   12/06/13 0938  BP: 137/88  Pulse: 60  Height: 6' (1.829 m)    Weight: 274 lb (124.286 kg)    PHYSICAL EXAM General: NAD  Neck: No JVD, no thyromegaly or thyroid nodule.  Lungs: Clear to auscultation bilaterally with normal respiratory effort.  CV: Nondisplaced PMI. Heart regular S1/S2, no S3/S4, soft I/VI ESM. Trace peripheral leg edema. Venous stasis dermatitis. No carotid bruit.  Abdomen: Soft, nontender, no hepatosplenomegaly, no distention.  Neurologic: Alert and oriented x 3.  Psych: Normal affect.  Extremities: No clubbing or cyanosis.    ECG: reviewed and available in electronic records.    Echo (10/05/13):  - Left ventricle: The cavity size was mildly dilated. Wall thickness was increased in a pattern of mild LVH. Systolic function was severely reduced. The estimated ejection fraction was in the range of 25% to 30%. Diffuse hypokinesis. Doppler parameters are consistent with restrictive physiology, indicative of decreased left ventricular diastolic compliance and/or increased left atrial pressure. - Ventricular septum: Septal motion showed abnormal function and dyssynergy. - Aortic valve: Moderately calcified annulus. Trileaflet; mildly calcified leaflets. Moderately sclerotic to mildly stenotic aortic valve. Trivial regurgitation. - Mitral valve: Calcified annulus. Trivial regurgitation. - Left atrium: The atrium was severely dilated. - Right ventricle: The cavity size was mildly dilated. - Right atrium: The atrium was moderately dilated. Central venous pressure: 42mm Hg (est). - Tricuspid valve: Mild regurgitation. - Pulmonary arteries: PA peak pressure: 2mm Hg (S). PA pressure: 22mm Hg (ED). - Pericardium, extracardiac: There was no pericardial effusion. Impressions:  - Mildly dilated LV with mild LVH and LVEF 25-30%, restrictive diastolic filling pattern. Severe left atrial enlargement. MAC with trivial mitral regurgitation. Moderately sclerotic to mildly stenotic aortic valve with trivial aortic regurgitation.  Mild RV enlargement. Moderate right atrial enlargement. Mild tricuspid regurgitation with PASP 54 mmHg.    ASSESSMENT AND PLAN: 1. Chronic systolic heart failure/nonischemic cardiomyopathy s/p CRTD, EF 25-30%: He is symptomatically stable and compensated, and has done well s/p CRTD. I will continue current diuretic regimen and have BMET drawn this week to monitor renal function. Renal function was normal on 3/14. Continue current doses of torsemide and spironolactone. I have asked him to take an extra 5 mg of metolazone for 3 days for a weight gain of 3-5 lbs, and to inform me if this  were to happen.  2. HTN: Controlled on present therapy.  3. Aortic stenosis: Mild by echo in 09/2013. 4. Non-ischemic cardiomyopathy, EF 25-30% s/p CRTD: Stable with normal device function. 5. Venous insufficiency: I previously made a referral to vascular surgery, but he has yet to follow up on this.   Dispo: f/u 4 months.    Kate Sable, M.D., F.A.C.C.

## 2013-12-06 NOTE — Patient Instructions (Signed)
Your physician recommends that you schedule a follow-up appointment in: 4 months with Dr. Bronson Ing. You should receive a letter in the mail in 1-2 months. If you do not receive this letter by May of June 2015 call our office to schedule this appointment.   Your physician recommends that you continue on your current medications as directed. Please refer to the Current Medication list given to you today.  Your physician recommends that you return for lab work in: next 2 weeks for BMET  You may go to one of the following locations to have lab work completed: [facility] [address] Dr. Edrick Oh office in [facility].

## 2013-12-11 ENCOUNTER — Other Ambulatory Visit: Payer: Self-pay | Admitting: *Deleted

## 2013-12-11 DIAGNOSIS — I429 Cardiomyopathy, unspecified: Secondary | ICD-10-CM

## 2013-12-12 ENCOUNTER — Encounter: Payer: Self-pay | Admitting: *Deleted

## 2014-02-05 ENCOUNTER — Encounter: Payer: Self-pay | Admitting: Internal Medicine

## 2014-02-05 ENCOUNTER — Ambulatory Visit (INDEPENDENT_AMBULATORY_CARE_PROVIDER_SITE_OTHER): Payer: Medicare Other | Admitting: Internal Medicine

## 2014-02-05 VITALS — BP 122/80 | HR 66 | Ht 72.0 in | Wt 289.0 lb

## 2014-02-05 DIAGNOSIS — I447 Left bundle-branch block, unspecified: Secondary | ICD-10-CM

## 2014-02-05 DIAGNOSIS — Z7901 Long term (current) use of anticoagulants: Secondary | ICD-10-CM

## 2014-02-05 DIAGNOSIS — I482 Chronic atrial fibrillation, unspecified: Secondary | ICD-10-CM

## 2014-02-05 DIAGNOSIS — I519 Heart disease, unspecified: Secondary | ICD-10-CM

## 2014-02-05 DIAGNOSIS — I4891 Unspecified atrial fibrillation: Secondary | ICD-10-CM

## 2014-02-05 DIAGNOSIS — I429 Cardiomyopathy, unspecified: Secondary | ICD-10-CM

## 2014-02-05 LAB — MDC_IDC_ENUM_SESS_TYPE_INCLINIC
Battery Remaining Longevity: 98 mo
Brady Statistic AP VP Percent: 14.86 %
Brady Statistic AS VP Percent: 83.37 %
Brady Statistic AS VS Percent: 1.7 %
Brady Statistic RA Percent Paced: 14.93 %
HIGH POWER IMPEDANCE MEASURED VALUE: 190 Ohm
HighPow Impedance: 75 Ohm
Lead Channel Impedance Value: 456 Ohm
Lead Channel Pacing Threshold Amplitude: 0.75 V
Lead Channel Pacing Threshold Amplitude: 0.75 V
Lead Channel Pacing Threshold Pulse Width: 0.4 ms
Lead Channel Pacing Threshold Pulse Width: 0.4 ms
Lead Channel Sensing Intrinsic Amplitude: 3.5 mV
Lead Channel Sensing Intrinsic Amplitude: 9.5 mV
Lead Channel Setting Pacing Amplitude: 2 V
Lead Channel Setting Pacing Amplitude: 2 V
Lead Channel Setting Sensing Sensitivity: 0.3 mV
MDC IDC MSMT BATTERY VOLTAGE: 3.04 V
MDC IDC MSMT LEADCHNL RV IMPEDANCE VALUE: 399 Ohm
MDC IDC MSMT LEADCHNL RV PACING THRESHOLD AMPLITUDE: 0.875 V
MDC IDC MSMT LEADCHNL RV PACING THRESHOLD PULSEWIDTH: 0.4 ms
MDC IDC SESS DTM: 20150622103834
MDC IDC SET LEADCHNL LV PACING PULSEWIDTH: 0.4 ms
MDC IDC SET LEADCHNL RV PACING AMPLITUDE: 2.5 V
MDC IDC SET LEADCHNL RV PACING PULSEWIDTH: 0.4 ms
MDC IDC SET ZONE DETECTION INTERVAL: 400 ms
MDC IDC STAT BRADY AP VS PERCENT: 0.07 %
MDC IDC STAT BRADY RV PERCENT PACED: 97.78 %
Zone Setting Detection Interval: 300 ms
Zone Setting Detection Interval: 350 ms
Zone Setting Detection Interval: 360 ms

## 2014-02-05 NOTE — Patient Instructions (Addendum)
   [facility] home check 05/09/14. Your physician recommends that you schedule a follow-up appointment in: 1 year with Dr. [name]. You will receive a reminder letter in the mail in about 10 months reminding you to call and schedule your appointment. If you don't receive this letter, please contact our office. Your physician recommends that you continue on your current medications as directed. Please refer to the Current Medication list given to you today. Your physician has requested that you have an echocardiogram. Echocardiography is a painless test that uses sound waves to create images of your heart. It provides your doctor with information about the size and shape of your heart and how well your heart's chambers and valves are working. This procedure takes approximately one hour. There are no restrictions for this procedure.

## 2014-02-05 NOTE — Progress Notes (Signed)
PCP: [name], MD Primary Cardiologist:  [name] [name] is a 53 y.o. male who presents today for routine electrophysiology followup.  Since his recent BiV ICD implant, the patient reports doing very well. His energy and exercise tolerance have improved.  He continues to have palpitations due to atrial tachycardia/ atrial fibrillation though these are infrequent and short lived.  Today, he denies symptoms of chest pain, shortness of breath, dizziness, presyncope, syncope, or ICD shocks.  His edema is stable.  The patient is otherwise without complaint today.   Past Medical History  Diagnosis Date  . Hypertension   . Morbid obesity   . Atrial flutter  10/01/2006, 12/30/11    Status post RF ablation, 7/13, Dr. [name]  . Type II or unspecified type diabetes mellitus without mention of complication, not stated as uncontrolled   . OSA (obstructive sleep apnea)     compliant with CPAP  . Nonischemic cardiomyopathy     Normal coronary arteries, 2008  . Chronic systolic dysfunction of left ventricle     EF 20-25%, global HK; mild RVD, 2-D echo, 5/13  . Unspecified disorder of kidney and ureter   . RBBB, anterior fascicular block and incomplete LBBB   . Pulmonary hypertension     RVSP 50-55 mmHg, 5/13  . Valvular heart disease     Mild MR/TR, 2-D echo, 5/13   Past Surgical History  Procedure Laterality Date  . Bi-ventricular implantable cardioverter defibrillator  (crt-d)  10/27/2013    MDT [name] XT CRTD implanted by [name] for NICM, CHF    Current Outpatient Prescriptions  Medication Sig Dispense Refill  . allopurinol (ZYLOPRIM) 300 MG tablet Take 300 mg by mouth daily.      [name] atorvastatin (LIPITOR) 10 MG tablet Take 10 mg by mouth daily.        . Canagliflozin (INVOKANA) 300 MG TABS Take 300 mg by mouth daily.      . cloNIDine (CATAPRES) 0.2 MG tablet Take 0.2 mg by mouth 2 (two) times daily.        [name] COLCRYS 0.6 MG tablet Take 0.6 mg by mouth daily as needed  (gout).       . dabigatran (PRADAXA) 150 MG CAPS capsule Take 1 capsule (150 mg total) by mouth 2 (two) times daily.  60 capsule  11  . digoxin (LANOXIN) 0.25 MG tablet Take 0.125 mg by mouth daily.      [name] gemfibrozil (LOPID) 600 MG tablet Take 600 mg by mouth 2 (two) times daily before a meal.        . glimepiride (AMARYL) 2 MG tablet Take 2 mg by mouth 2 (two) times daily.       . insulin aspart (NOVOLOG) 100 UNIT/ML injection Inject 30 Units into the skin 2 (two) times daily before a meal.       . insulin detemir (LEVEMIR) 100 UNIT/ML injection Inject 100 Units into the skin 2 (two) times daily.       . metFORMIN (GLUCOPHAGE) 1000 MG tablet Take 1 tablet (1,000 mg total) by mouth 2 (two) times daily with a meal.      . metoprolol (LOPRESSOR) 100 MG tablet Take 100 mg by mouth 2 (two) times daily.        . prazosin (MINIPRESS) 1 MG capsule Take 1 mg by mouth 2 (two) times daily.      . sitaGLIPtin (JANUVIA) 100 MG tablet Take 100 mg by mouth daily.      [name]  spironolactone (ALDACTONE) 25 MG tablet Take 1 tablet (25 mg total) by mouth daily.  30 tablet  6  . torsemide (DEMADEX) 20 MG tablet Take 20-40 mg by mouth 2 (two) times daily. Take 40mg  in the morning and 20mg  in the evening.      . valsartan (DIOVAN) 160 MG tablet Take 160 mg by mouth daily.       No current facility-administered medications for this visit.    Physical Exam: Filed Vitals:   02/05/14 1006  BP: 122/80  Pulse: 66  Height: 6' (1.829 m)  Weight: 289 lb (131.09 kg)    GEN- The patient is well appearing, alert and oriented x 3 today.   Head- normocephalic, atraumatic Eyes-  Sclera clear, conjunctiva pink Ears- hearing intact Oropharynx- clear Lungs- Clear to ausculation bilaterally, normal work of breathing Chest- ICD pocket is well healed Heart- Regular rate and rhythm, no murmurs, rubs or gallops, PMI not laterally displaced GI- soft, NT, ND, + BS Extremities- no clubbing, cyanosis, +1 edema  ICD interrogation-  reviewed in detail today,  See PACEART report ekg today reveals sinus rhythm with BiV pacing  Assessment and Plan:  1.  Chronic systolic dysfunction euvolemic today Stable on an appropriate medical regimen Normal BiV ICD function See Pace Art report No changes today Repeat echo to evaluate response to CRT when he returns to see [name]  2. Atrial tachycardia/ atrial fibrillation Burden is < .1% Continue current medicine strategy Would not initiate antiarrhythmic medicines at this time  3. Obesity Weight loss is advised  4. OSA He reports compliance with CPAP  [facility] Return to see me in 1 year

## 2014-02-09 ENCOUNTER — Telehealth: Payer: Self-pay | Admitting: *Deleted

## 2014-02-12 NOTE — Telephone Encounter (Signed)
Patient informed that he needed an echo in August before his appointment with Dr. Bronson Ing.

## 2014-03-19 ENCOUNTER — Other Ambulatory Visit: Payer: Self-pay | Admitting: Cardiovascular Disease

## 2014-03-28 ENCOUNTER — Other Ambulatory Visit: Payer: Medicare Other

## 2014-04-02 ENCOUNTER — Other Ambulatory Visit (INDEPENDENT_AMBULATORY_CARE_PROVIDER_SITE_OTHER): Payer: Medicare Other

## 2014-04-02 DIAGNOSIS — I519 Heart disease, unspecified: Secondary | ICD-10-CM

## 2014-04-02 DIAGNOSIS — I447 Left bundle-branch block, unspecified: Secondary | ICD-10-CM

## 2014-04-02 DIAGNOSIS — I429 Cardiomyopathy, unspecified: Secondary | ICD-10-CM

## 2014-04-02 DIAGNOSIS — I4891 Unspecified atrial fibrillation: Secondary | ICD-10-CM

## 2014-04-02 DIAGNOSIS — I059 Rheumatic mitral valve disease, unspecified: Secondary | ICD-10-CM

## 2014-04-02 DIAGNOSIS — I359 Nonrheumatic aortic valve disorder, unspecified: Secondary | ICD-10-CM

## 2014-04-02 DIAGNOSIS — I482 Chronic atrial fibrillation, unspecified: Secondary | ICD-10-CM

## 2014-04-03 ENCOUNTER — Ambulatory Visit (INDEPENDENT_AMBULATORY_CARE_PROVIDER_SITE_OTHER): Payer: Medicare Other | Admitting: Cardiovascular Disease

## 2014-04-03 ENCOUNTER — Encounter: Payer: Self-pay | Admitting: Cardiovascular Disease

## 2014-04-03 VITALS — BP 130/77 | HR 70 | Ht 72.0 in | Wt 282.0 lb

## 2014-04-03 DIAGNOSIS — I10 Essential (primary) hypertension: Secondary | ICD-10-CM

## 2014-04-03 DIAGNOSIS — Z7901 Long term (current) use of anticoagulants: Secondary | ICD-10-CM

## 2014-04-03 DIAGNOSIS — I519 Heart disease, unspecified: Secondary | ICD-10-CM

## 2014-04-03 DIAGNOSIS — I429 Cardiomyopathy, unspecified: Secondary | ICD-10-CM

## 2014-04-03 DIAGNOSIS — I872 Venous insufficiency (chronic) (peripheral): Secondary | ICD-10-CM

## 2014-04-03 DIAGNOSIS — I359 Nonrheumatic aortic valve disorder, unspecified: Secondary | ICD-10-CM

## 2014-04-03 DIAGNOSIS — Z79899 Other long term (current) drug therapy: Secondary | ICD-10-CM

## 2014-04-03 DIAGNOSIS — I5022 Chronic systolic (congestive) heart failure: Secondary | ICD-10-CM

## 2014-04-03 DIAGNOSIS — I480 Paroxysmal atrial fibrillation: Secondary | ICD-10-CM

## 2014-04-03 DIAGNOSIS — I4891 Unspecified atrial fibrillation: Secondary | ICD-10-CM

## 2014-04-03 DIAGNOSIS — I350 Nonrheumatic aortic (valve) stenosis: Secondary | ICD-10-CM

## 2014-04-03 DIAGNOSIS — Z95810 Presence of automatic (implantable) cardiac defibrillator: Secondary | ICD-10-CM

## 2014-04-03 DIAGNOSIS — E785 Hyperlipidemia, unspecified: Secondary | ICD-10-CM

## 2014-04-03 NOTE — Progress Notes (Signed)
Patient ID: [name], male   DOB: 1960/11/28, 53 y.o.   MRN: [id number]      SUBJECTIVE: The patient returns for followup for chronic systolic heart failure and is s/p CRT-D. Echocardiogram performed yesterday showed improvement in left ventricular systolic function, EF now increased to 35% and may actually be better. I reviewed the echocardiographic images yesterday and regional wall motion and left ventricular systolic function were difficult to accurately assess. He does have grade 3 diastolic dysfunction and appeared to have aortic sclerosis but no significant stenosis. He denies chest pain and exertional dyspnea, and feels that his energy levels have improved overall. He very seldom has palpitations and they are self-limited. He denies orthopnea, paroxysmal nocturnal dyspnea and leg swelling. He also denies lightheadedness and dizziness.   Review of Systems: As per "subjective", otherwise negative.  No Known Allergies  Current Outpatient Prescriptions  Medication Sig Dispense Refill  . allopurinol (ZYLOPRIM) 300 MG tablet Take 300 mg by mouth daily.      [name] atorvastatin (LIPITOR) 10 MG tablet Take 10 mg by mouth daily.        . Canagliflozin (INVOKANA) 300 MG TABS Take 300 mg by mouth daily.      . cloNIDine (CATAPRES) 0.2 MG tablet Take 0.2 mg by mouth 2 (two) times daily.        [name] COLCRYS 0.6 MG tablet Take 0.6 mg by mouth daily as needed (gout).       . dabigatran (PRADAXA) 150 MG CAPS capsule Take 1 capsule (150 mg total) by mouth 2 (two) times daily.  60 capsule  11  . digoxin (LANOXIN) 0.25 MG tablet Take 0.125 mg by mouth daily.      [name] gemfibrozil (LOPID) 600 MG tablet Take 600 mg by mouth 2 (two) times daily before a meal.        . glimepiride (AMARYL) 2 MG tablet Take 2 mg by mouth 2 (two) times daily.       . insulin aspart (NOVOLOG) 100 UNIT/ML injection Inject 30 Units into the skin 2 (two) times daily before a meal.       . insulin detemir (LEVEMIR) 100 UNIT/ML  injection Inject 100 Units into the skin 2 (two) times daily.       . metFORMIN (GLUCOPHAGE) 1000 MG tablet Take 1 tablet (1,000 mg total) by mouth 2 (two) times daily with a meal.      . metoprolol (LOPRESSOR) 100 MG tablet Take 100 mg by mouth 2 (two) times daily.        . prazosin (MINIPRESS) 1 MG capsule Take 1 mg by mouth 2 (two) times daily.      . sitaGLIPtin (JANUVIA) 100 MG tablet Take 100 mg by mouth daily.      [name] spironolactone (ALDACTONE) 25 MG tablet Take 1 tablet (25 mg total) by mouth daily.  30 tablet  6  . torsemide (DEMADEX) 20 MG tablet TAKE TWO (2) TABLETS BY MOUTH EVERY MORNING AND TAKE ONE TABLET BY MOUTH EVERY EVENING  90 tablet  6  . valsartan (DIOVAN) 160 MG tablet Take 160 mg by mouth daily.       No current facility-administered medications for this visit.    Past Medical History  Diagnosis Date  . Hypertension   . Morbid obesity   . Atrial flutter  10/01/2006, 12/30/11    Status post RF ablation, 7/13, Dr. [name]  . Type II or unspecified type diabetes mellitus without mention of complication, not stated as  uncontrolled   . OSA (obstructive sleep apnea)     compliant with CPAP  . Nonischemic cardiomyopathy     Normal coronary arteries, 2008  . Chronic systolic dysfunction of left ventricle     EF 20-25%, global HK; mild RVD, 2-D echo, 5/13  . Unspecified disorder of kidney and ureter   . RBBB, anterior fascicular block and incomplete LBBB   . Pulmonary hypertension     RVSP 50-55 mmHg, 5/13  . Valvular heart disease     Mild MR/TR, 2-D echo, 5/13    Past Surgical History  Procedure Laterality Date  . Bi-ventricular implantable cardioverter defibrillator  (crt-d)  10/27/2013    MDT [name] XT CRTD implanted by [name] for NICM, CHF    History   Social History  . Marital Status: Married    Spouse Name: N/A    Number of Children: N/A  . Years of Education: N/A   Occupational History  . DISABLED    Social History Main Topics  . Smoking  status: Never Smoker   . Smokeless tobacco: Never Used  . Alcohol Use: No  . Drug Use: No  . Sexual Activity: Not on file   Other Topics Concern  . Not on file   Social History Narrative   Pt lives in [place] alone.  Disabled.  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     Filed Vitals:   04/03/14 1255  BP: 130/77  Pulse: 70  Height: 6' (1.829 m)  Weight: 282 lb (127.914 kg)    PHYSICAL EXAM General: NAD HEENT: Normal. Neck: No JVD, no thyromegaly. Lungs: Clear to auscultation bilaterally with normal respiratory effort. CV: Nondisplaced PMI.  Regular rate and rhythm, normal S1/S2, no S3/S4, soft II/VI systolic murmur over RUSB. No pretibial or periankle edema.  No carotid bruit.  Normal pedal pulses.  Abdomen: Soft, nontender, no hepatosplenomegaly, no distention.  Neurologic: Alert and oriented x 3.  Psych: Normal affect. Skin: Normal. Musculoskeletal: Normal range of motion, no gross deformities. Extremities: No clubbing or cyanosis.   ECG: Most recent ECG reviewed.      ASSESSMENT AND PLAN: 1. Chronic systolic heart failure/nonischemic cardiomyopathy s/p CRTD, EF 35%: He is symptomatically stable and compensated, and has done well s/p CRTD. I will continue current diuretic regimen and have BMET drawn this week to monitor renal function. Renal function was normal in December 08, 2013, BUN 27, creatinine 0.94. Continue current doses of torsemide and spironolactone. I have asked him to take an extra 5 mg of metolazone for 3 days for a weight gain of 3-5 lbs, and to inform me if this were to happen.  I may reassess his LV systolic function in 6 months with a limited echo with contrast enhancement for a more accurate assessment. 2. Essential HTN: Controlled on present therapy. No changes indicated. 3. Aortic stenosis: Echo yesterday showed aortic valve sclerosis without stenosis.  4. Non-ischemic cardiomyopathy, EF 35% s/p CRTD: Stable with normal device function.  Device was interrogated on 02/05/2014 demonstrating 6 mode switches with a 9 minute episode of atrial fibrillation with a rapid ventricular response. Biventricular pacing occurring 97% of the time. He is on Pradaxa, digoxin, and metoprolol. 5. Venous insufficiency: I previously made a referral to vascular surgery, but he has yet to follow up on this.  6. Atrial fibrillation: Taking Pradaxa without bleeding difficulties.  Dispo: f/u 6 months.  Kate Sable, M.D., F.A.C.C.

## 2014-04-03 NOTE — Patient Instructions (Signed)
   Lab for [employer] will contact with results via phone or letter.   Your physician wants you to follow up in: 6 months.  You will receive a reminder letter in the mail one-two months in advance.  If you don't receive a letter, please call our office to schedule the follow up appointment

## 2014-04-09 ENCOUNTER — Telehealth: Payer: Self-pay | Admitting: *Deleted

## 2014-04-09 DIAGNOSIS — R931 Abnormal findings on diagnostic imaging of heart and coronary circulation: Secondary | ICD-10-CM

## 2014-04-09 NOTE — Telephone Encounter (Signed)
Message copied by Laurine Blazer on Mon Apr 09, 2014  1:30 PM ------      Message from: Thompson Grayer      Created: Thu Apr 05, 2014 11:21 PM       Results reviewed.  Please inform pt of result.  Please have the patient return for contrast enhancement by echo (2d echo with contrast) to further evaluate EF. ------

## 2014-04-09 NOTE — Telephone Encounter (Signed)
Notes Recorded by Laurine Blazer, LPN on 8/36/6294 at 7:65 PM Left message to return call.

## 2014-04-12 NOTE — Telephone Encounter (Signed)
Notes Recorded by Laurine Blazer, LPN on 6/83/7290 at 21:11 AM Home # - magic jack number not assigned to customer. Cell # - Left message to return call.

## 2014-04-13 ENCOUNTER — Telehealth: Payer: Self-pay | Admitting: *Deleted

## 2014-04-13 MED ORDER — TORSEMIDE 20 MG PO TABS: 20.0000 mg | ORAL_TABLET | Freq: Two times a day (BID) | ORAL | Status: DC

## 2014-04-13 NOTE — Telephone Encounter (Signed)
Message copied by Orion Modest on Fri Apr 13, 2014 11:07 AM ------      Message from: Kate Sable A      Created: Fri Apr 13, 2014 10:05 AM       Reduce torsemide to 20 mg bid. ------

## 2014-04-13 NOTE — Telephone Encounter (Signed)
Left message on pts voicemail to reduce torsemide to 20mg  twice a day .

## 2014-04-17 ENCOUNTER — Telehealth: Payer: Self-pay | Admitting: *Deleted

## 2014-04-17 ENCOUNTER — Encounter: Payer: Self-pay | Admitting: *Deleted

## 2014-04-17 NOTE — Telephone Encounter (Signed)
Notes Recorded by Laurine Blazer, LPN on 12/16/8982 at 2:10 PM Patient notified. Will put order in for Echo w/ contrast & forward to [facility]) for scheduling.

## 2014-04-17 NOTE — Telephone Encounter (Signed)
Notes Recorded by Laurine Blazer, LPN on 09/20/4626 at 6:38 PM Patient notified and verbalized understanding.

## 2014-04-17 NOTE — Telephone Encounter (Signed)
Message copied by Laurine Blazer on Tue Apr 17, 2014  4:10 PM ------      Message from: Kate Sable A      Created: Fri Apr 13, 2014 10:05 AM       Reduce torsemide to 20 mg bid. ------

## 2014-04-24 ENCOUNTER — Ambulatory Visit (HOSPITAL_COMMUNITY)
Admission: RE | Admit: 2014-04-24 | Discharge: 2014-04-24 | Disposition: A | Discharge status: Home / Self Care | Payer: Medicare Other | Source: Ambulatory Visit | Attending: Cardiology | Admitting: Cardiology

## 2014-04-24 DIAGNOSIS — I2589 Other forms of chronic ischemic heart disease: Secondary | ICD-10-CM | POA: Insufficient documentation

## 2014-04-24 DIAGNOSIS — I10 Essential (primary) hypertension: Secondary | ICD-10-CM | POA: Diagnosis not present

## 2014-04-24 DIAGNOSIS — R9439 Abnormal result of other cardiovascular function study: Secondary | ICD-10-CM | POA: Diagnosis present

## 2014-04-24 DIAGNOSIS — I509 Heart failure, unspecified: Secondary | ICD-10-CM

## 2014-04-24 DIAGNOSIS — G4733 Obstructive sleep apnea (adult) (pediatric): Secondary | ICD-10-CM | POA: Diagnosis not present

## 2014-04-24 DIAGNOSIS — E119 Type 2 diabetes mellitus without complications: Secondary | ICD-10-CM | POA: Insufficient documentation

## 2014-04-24 DIAGNOSIS — I5189 Other ill-defined heart diseases: Secondary | ICD-10-CM | POA: Insufficient documentation

## 2014-04-24 DIAGNOSIS — Z950 Presence of cardiac pacemaker: Secondary | ICD-10-CM | POA: Insufficient documentation

## 2014-04-24 DIAGNOSIS — I2789 Other specified pulmonary heart diseases: Secondary | ICD-10-CM | POA: Diagnosis not present

## 2014-04-24 DIAGNOSIS — R931 Abnormal findings on diagnostic imaging of heart and coronary circulation: Secondary | ICD-10-CM

## 2014-04-24 MED ORDER — SODIUM CHLORIDE 0.9 % IJ SOLN
10.0000 mL | INTRAMUSCULAR | Status: DC | PRN
  Administered 2014-04-24: 10 mL via INTRAVENOUS

## 2014-04-24 MED ORDER — PERFLUTREN LIPID MICROSPHERE
1.0000 mL | INTRAVENOUS | Status: AC | PRN
  Administered 2014-04-24: 1 mL via INTRAVENOUS
  Administered 2014-04-24: 2 mL via INTRAVENOUS
  Administered 2014-04-24 (×2): 1 mL via INTRAVENOUS
  Administered 2014-04-24: 2 mL via INTRAVENOUS
  Filled 2014-04-24: qty 10

## 2014-04-24 NOTE — Progress Notes (Signed)
  Echocardiogram 2D Echocardiogram limited with Definity has been performed.  [place], [place] 04/24/2014, 12:25 PM

## 2014-05-01 ENCOUNTER — Encounter: Payer: Self-pay | Admitting: *Deleted

## 2014-05-01 ENCOUNTER — Other Ambulatory Visit: Payer: Self-pay | Admitting: Cardiovascular Disease

## 2014-05-09 ENCOUNTER — Encounter: Payer: Medicare Other | Admitting: *Deleted

## 2014-05-09 ENCOUNTER — Telehealth: Payer: Self-pay | Admitting: Cardiology

## 2014-05-09 NOTE — Telephone Encounter (Signed)
LMOVM reminding pt to send remote transmission.   

## 2014-05-10 ENCOUNTER — Other Ambulatory Visit: Payer: Self-pay | Admitting: Cardiovascular Disease

## 2014-05-10 ENCOUNTER — Encounter: Payer: Self-pay | Admitting: Cardiology

## 2014-05-17 ENCOUNTER — Telehealth: Payer: Self-pay | Admitting: Internal Medicine

## 2014-05-17 NOTE — Telephone Encounter (Signed)
New Message  Pt called to dicuss if his signal was received. Please call

## 2014-05-17 NOTE — Telephone Encounter (Signed)
Informed pt that we did not receive transmission. Walked him through a manual transmission.

## 2014-06-26 ENCOUNTER — Encounter: Payer: Self-pay | Admitting: Cardiology

## 2014-07-26 ENCOUNTER — Encounter (HOSPITAL_COMMUNITY): Payer: Self-pay | Admitting: Internal Medicine

## 2014-08-09 ENCOUNTER — Other Ambulatory Visit: Payer: Self-pay | Admitting: Cardiovascular Disease

## 2014-10-01 ENCOUNTER — Encounter: Payer: Self-pay | Admitting: Cardiovascular Disease

## 2014-10-01 ENCOUNTER — Ambulatory Visit (INDEPENDENT_AMBULATORY_CARE_PROVIDER_SITE_OTHER): Payer: Medicare Other | Admitting: Cardiovascular Disease

## 2014-10-01 VITALS — BP 115/74 | HR 69 | Ht 72.0 in | Wt 304.1 lb

## 2014-10-01 DIAGNOSIS — I480 Paroxysmal atrial fibrillation: Secondary | ICD-10-CM

## 2014-10-01 DIAGNOSIS — I509 Heart failure, unspecified: Secondary | ICD-10-CM

## 2014-10-01 DIAGNOSIS — N183 Chronic kidney disease, stage 3 unspecified: Secondary | ICD-10-CM

## 2014-10-01 DIAGNOSIS — I872 Venous insufficiency (chronic) (peripheral): Secondary | ICD-10-CM

## 2014-10-01 DIAGNOSIS — E785 Hyperlipidemia, unspecified: Secondary | ICD-10-CM

## 2014-10-01 DIAGNOSIS — R609 Edema, unspecified: Secondary | ICD-10-CM

## 2014-10-01 DIAGNOSIS — I5023 Acute on chronic systolic (congestive) heart failure: Secondary | ICD-10-CM

## 2014-10-01 DIAGNOSIS — Z95810 Presence of automatic (implantable) cardiac defibrillator: Secondary | ICD-10-CM

## 2014-10-01 DIAGNOSIS — R931 Abnormal findings on diagnostic imaging of heart and coronary circulation: Secondary | ICD-10-CM

## 2014-10-01 DIAGNOSIS — Z7901 Long term (current) use of anticoagulants: Secondary | ICD-10-CM

## 2014-10-01 LAB — MDC_IDC_ENUM_SESS_TYPE_REMOTE
Battery Remaining Longevity: 92 mo
Battery Voltage: 3.01 V
Brady Statistic AP VS Percent: 0.03 %
Brady Statistic AS VS Percent: 1.71 %
Brady Statistic RA Percent Paced: 3.73 %
Brady Statistic RV Percent Paced: 97.22 %
HighPow Impedance: 75 Ohm
Lead Channel Impedance Value: 1007 Ohm
Lead Channel Impedance Value: 285 Ohm
Lead Channel Impedance Value: 456 Ohm
Lead Channel Impedance Value: 475 Ohm
Lead Channel Impedance Value: 703 Ohm
Lead Channel Impedance Value: 722 Ohm
Lead Channel Impedance Value: 893 Ohm
Lead Channel Pacing Threshold Amplitude: 0.75 V
Lead Channel Pacing Threshold Amplitude: 1 V
Lead Channel Pacing Threshold Amplitude: 1.125 V
Lead Channel Pacing Threshold Pulse Width: 0.4 ms
Lead Channel Pacing Threshold Pulse Width: 0.4 ms
Lead Channel Sensing Intrinsic Amplitude: 2.625 mV
Lead Channel Sensing Intrinsic Amplitude: 2.625 mV
Lead Channel Setting Pacing Amplitude: 1.75 V
Lead Channel Setting Pacing Amplitude: 2.5 V
Lead Channel Setting Pacing Pulse Width: 0.4 ms
MDC IDC MSMT LEADCHNL LV IMPEDANCE VALUE: 1083 Ohm
MDC IDC MSMT LEADCHNL LV IMPEDANCE VALUE: 1083 Ohm
MDC IDC MSMT LEADCHNL LV IMPEDANCE VALUE: 513 Ohm
MDC IDC MSMT LEADCHNL LV IMPEDANCE VALUE: 532 Ohm
MDC IDC MSMT LEADCHNL LV IMPEDANCE VALUE: 874 Ohm
MDC IDC MSMT LEADCHNL RA PACING THRESHOLD PULSEWIDTH: 0.4 ms
MDC IDC MSMT LEADCHNL RV IMPEDANCE VALUE: 399 Ohm
MDC IDC MSMT LEADCHNL RV SENSING INTR AMPL: 9.5 mV
MDC IDC MSMT LEADCHNL RV SENSING INTR AMPL: 9.5 mV
MDC IDC SESS DTM: 20160215223804
MDC IDC SET LEADCHNL LV PACING PULSEWIDTH: 0.4 ms
MDC IDC SET LEADCHNL RA PACING AMPLITUDE: 2 V
MDC IDC SET LEADCHNL RV SENSING SENSITIVITY: 0.3 mV
MDC IDC SET ZONE DETECTION INTERVAL: 360 ms
MDC IDC SET ZONE DETECTION INTERVAL: 400 ms
MDC IDC STAT BRADY AP VP PERCENT: 3.7 %
MDC IDC STAT BRADY AS VP PERCENT: 94.56 %
Zone Setting Detection Interval: 300 ms
Zone Setting Detection Interval: 350 ms

## 2014-10-01 MED ORDER — METOLAZONE 2.5 MG PO TABS
2.5000 mg | ORAL_TABLET | Freq: Every day | ORAL | Status: DC
Start: 2014-10-01 — End: 2014-10-18

## 2014-10-01 NOTE — Patient Instructions (Signed)
   Increase Torsemide to 40mg  twice a day  X 4 days, then resume previous dose.  Begin Metolazone 2.5mg  daily x 4 days only, then stop.  Start both medications today. Continue all other medications.   Lab for BMET - do this Thursday, 10/04/2014 Your physician has requested that you have an echocardiogram. Echocardiography is a painless test that uses sound waves to create images of your heart. It provides your doctor with information about the size and shape of your heart and how well your heart's chambers and valves are working. This procedure takes approximately one hour. There are no restrictions for this procedure. Office will contact with results via phone or letter.   Device interrogation as soon as possible - will call you regarding setting this up. Follow up in [place] on Wednesday, 10/10/2014

## 2014-10-01 NOTE — Progress Notes (Signed)
Patient ID: [name], male   DOB: [birth year], 54 y.o.   MRN: [id number]      SUBJECTIVE: The patient returns for follow up for chronic systolic heart failure and has a CRT-D system in place. Echocardiogram performed on 04/24/14 demonstrated moderate to severely reduced left ventricular systolic function, EF 26%, grade 3 diastolic dysfunction and aortic sclerosis but no significant stenosis. He had been doing well when I last evaluated him in August 2015. For the past one month, he has been having increasing exertional dyspnea, abdominal distention, bilateral leg swelling, and generalized weakness. As per his home scale he has put on 20-30 pounds in the past one month. On 04/03/14 he weight 282 pounds at his office visit with me and weighs 304 pounds today. He denies any device shocks. He does not have any metolazone. When he lies down he sometimes gasps for breath but feels fine when sitting up. There was no device transmission on 05/17/2014. He denies chest pain and syncope. He is here with his mother.   Review of Systems: As per "subjective", otherwise negative.  No Known Allergies  Current Outpatient Prescriptions  Medication Sig Dispense Refill  . allopurinol (ZYLOPRIM) 300 MG tablet Take 300 mg by mouth daily.    [name] atorvastatin (LIPITOR) 10 MG tablet Take 10 mg by mouth daily.      . Canagliflozin (INVOKANA) 300 MG TABS Take 300 mg by mouth daily.    . cloNIDine (CATAPRES) 0.2 MG tablet Take 0.2 mg by mouth 2 (two) times daily.      [name] COLCRYS 0.6 MG tablet Take 0.6 mg by mouth daily as needed (gout).     . dabigatran (PRADAXA) 150 MG CAPS capsule Take 1 capsule (150 mg total) by mouth 2 (two) times daily. 60 capsule 11  . digoxin (LANOXIN) 0.25 MG tablet Take 0.125 mg by mouth daily.    [name] gemfibrozil (LOPID) 600 MG tablet Take 600 mg by mouth 2 (two) times daily before a meal.      . glimepiride (AMARYL) 2 MG tablet Take 2 mg by mouth 2 (two) times daily.     . insulin aspart  (NOVOLOG) 100 UNIT/ML injection Inject 30 Units into the skin 2 (two) times daily before a meal.     . insulin detemir (LEVEMIR) 100 UNIT/ML injection Inject 100 Units into the skin 2 (two) times daily.     . metFORMIN (GLUCOPHAGE) 1000 MG tablet Take 1 tablet (1,000 mg total) by mouth 2 (two) times daily with a meal.    . metoprolol (LOPRESSOR) 100 MG tablet Take 100 mg by mouth 2 (two) times daily.      . prazosin (MINIPRESS) 1 MG capsule Take 1 mg by mouth 2 (two) times daily.    . prazosin (MINIPRESS) 1 MG capsule TAKE ONE CAPSULE BY MOUTH TWICE DAILY 60 capsule 6  . sitaGLIPtin (JANUVIA) 100 MG tablet Take 100 mg by mouth daily.    [name] spironolactone (ALDACTONE) 25 MG tablet TAKE ONE TABLET BY MOUTH ONCE DAILY 30 tablet 6  . torsemide (DEMADEX) 20 MG tablet Take 1 tablet (20 mg total) by mouth 2 (two) times daily. 60 tablet 6  . valsartan (DIOVAN) 160 MG tablet Take 160 mg by mouth daily.     No current facility-administered medications for this visit.    Past Medical History  Diagnosis Date  . Hypertension   . Morbid obesity   . Atrial flutter  10/01/2006, 12/30/11    Status post RF ablation,  7/13, Dr. [name]  . Type II or unspecified type diabetes mellitus without mention of complication, not stated as uncontrolled   . OSA (obstructive sleep apnea)     compliant with CPAP  . Nonischemic cardiomyopathy     Normal coronary arteries, 2008  . Chronic systolic dysfunction of left ventricle     EF 20-25%, global HK; mild RVD, 2-D echo, 5/13  . Unspecified disorder of kidney and ureter   . RBBB, anterior fascicular block and incomplete LBBB   . Pulmonary hypertension     RVSP 50-55 mmHg, 5/13  . Valvular heart disease     Mild MR/TR, 2-D echo, 5/13    Past Surgical History  Procedure Laterality Date  . Bi-ventricular implantable cardioverter defibrillator  (crt-d)  10/27/2013    MDT [name] XT CRTD implanted by [name] for NICM, CHF  . Atrial flutter ablation N/A 03/04/2012     Procedure: ATRIAL FLUTTER ABLATION;  Surgeon: Thompson Grayer, MD;  Location: [facility] CATH LAB;  Service: Cardiovascular;  Laterality: N/A;  . Bi-ventricular implantable cardioverter defibrillator N/A 10/27/2013    Procedure: BI-VENTRICULAR IMPLANTABLE CARDIOVERTER DEFIBRILLATOR  (CRT-D);  Surgeon: Coralyn Mark, MD;  Location: [facility] CATH LAB;  Service: Cardiovascular;  Laterality: N/A;    History   Social History  . Marital Status: Married    Spouse Name: N/A  . Number of Children: N/A  . Years of Education: N/A   Occupational History  . DISABLED    Social History Main Topics  . Smoking status: Never Smoker   . Smokeless tobacco: Never Used  . Alcohol Use: No  . Drug Use: No  . Sexual Activity: Not on file   Other Topics Concern  . Not on file   Social History Narrative   Pt lives in [place] alone.  Disabled.  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     Filed Vitals:   10/01/14 1342  BP: 115/74  Pulse: 69  Height: 6' (1.829 m)  Weight: 304 lb 1.9 oz (137.948 kg)  SpO2: 99%    PHYSICAL EXAM General: NAD HEENT: Normal. Neck: Difficult to assess JVP, no thyromegaly. Lungs: Clear to auscultation bilaterally with normal respiratory effort. CV: Regular rate and rhythm, normal S1/split S2, no S3/S4, soft II/VI systolic murmur over RUSB. 2+ pitting pretibial and periankle edema with pretibial erythema b/l with excoriations. Abdomen: Firm, nontender, obese.  Neurologic: Alert and oriented x 3.  Psych: Normal affect. Skin: Pretibial erythema b/l with excoriations Musculoskeletal: Normal range of motion, no gross deformities. Extremities: No clubbing or cyanosis.   ECG: Most recent ECG reviewed.      ASSESSMENT AND PLAN: 1. Acute on chronic systolic heart failure/nonischemic cardiomyopathy s/p CRTD, EF 35%: He has decompensated over the past one month with a minimum of 22 lb weight gain. I will reassess his LV systolic function with an echocardiogram with contrast  enhancement to see if LV systolic function has declined. I will also have device interrogated to see how often he is biventricular pacing. I will increase torsemide to 40 mg bid x 4 days and add metolazone 2.5 mg daily x 4 days with BMET check on 2/18, and have him f/u with me next week. I have informed him that if I am unable to adequately diurese him, I will have to hospitalize him for IV diuresis.  2. Essential HTN: Controlled on present therapy. No changes indicated. 3. Aortic sclerosis: Echo results noted above with aortic valve sclerosis without stenosis.  4. Non-ischemic cardiomyopathy, EF 35% s/p CRTD: Refer to #1. Has decompensated. Will repeat echocardiogram as noted above and interrogate device. Device interrogation on 02/05/2014 demonstrated 6 mode switches with a 9 minute episode of atrial fibrillation with a rapid ventricular response. Was biventricular pacing 97% of the time. He is on Pradaxa, digoxin, and metoprolol. 5. Venous insufficiency: I previously made a referral to vascular surgery, but he has yet to follow up on this.  6. Atrial fibrillation: Taking Pradaxa without bleeding difficulties.  Dispo: f/u on 10/10/14.  Time spent: 40 minutes, of which >50% spent reviewing management plan with patient and his mother.   Kate Sable, M.D., F.A.C.C.

## 2014-10-02 ENCOUNTER — Telehealth: Payer: Self-pay | Admitting: *Deleted

## 2014-10-02 NOTE — Telephone Encounter (Signed)
Patient in office yesterday to see Dr. Bronson Ing.  He needs his device interrogated ASAP.  Looks like he does remotes.  Patient states that he is not sure if his box is working.  Not sure patient understands how to do this as last transmission was done manually with help of device tech.  Please call patient.

## 2014-10-02 NOTE — Telephone Encounter (Signed)
LMOVM on both numbers for pt to return call.

## 2014-10-02 NOTE — Addendum Note (Signed)
Addended by: Laurine Blazer on: 10/02/2014 11:14 AM   Modules accepted: Orders

## 2014-10-03 ENCOUNTER — Ambulatory Visit (INDEPENDENT_AMBULATORY_CARE_PROVIDER_SITE_OTHER): Payer: Medicare Other | Admitting: *Deleted

## 2014-10-03 ENCOUNTER — Other Ambulatory Visit: Payer: Medicare Other

## 2014-10-03 DIAGNOSIS — I5022 Chronic systolic (congestive) heart failure: Secondary | ICD-10-CM

## 2014-10-03 DIAGNOSIS — I480 Paroxysmal atrial fibrillation: Secondary | ICD-10-CM

## 2014-10-03 NOTE — Telephone Encounter (Signed)
Patient returned call

## 2014-10-03 NOTE — Telephone Encounter (Signed)
Spoke w/ pt had him read me monitor serial number off monitor. I attempted to call [employer] but was on hold for 30 mins will call later today to have monitor serial number put into carelink system.

## 2014-10-03 NOTE — Telephone Encounter (Signed)
Spoke w/ [employer] and had serial number entered in pt carelink profile. Called pt and made him aware that transmission was received.

## 2014-10-03 NOTE — Progress Notes (Signed)
Remote ICD transmission.   

## 2014-10-08 ENCOUNTER — Telehealth: Payer: Self-pay | Admitting: *Deleted

## 2014-10-08 NOTE — Telephone Encounter (Signed)
Notes Recorded by Laurine Blazer, LPN on 5/49/8264 at 1:58 AM No answer.

## 2014-10-08 NOTE — Telephone Encounter (Signed)
-----   Message from Herminio Commons, MD sent at 10/05/2014  3:20 PM EST ----- Worsening renal function due to increased diuretic requirement. Once he has resumed his normal regimen, will repeat BMET one week later.

## 2014-10-09 NOTE — Telephone Encounter (Signed)
Notes Recorded by Laurine Blazer, LPN on 0/98/1191 at 47:82 AM No answer at home number. Left message on cell number.

## 2014-10-10 ENCOUNTER — Ambulatory Visit: Payer: Medicare Other | Admitting: Cardiovascular Disease

## 2014-10-11 ENCOUNTER — Encounter: Payer: Self-pay | Admitting: Cardiovascular Disease

## 2014-10-11 ENCOUNTER — Ambulatory Visit: Payer: Medicare Other | Admitting: Cardiovascular Disease

## 2014-10-12 NOTE — Telephone Encounter (Signed)
No answer at home number.   Left message on cell number.

## 2014-10-14 ENCOUNTER — Inpatient Hospital Stay (HOSPITAL_COMMUNITY)
Admission: AD | Admit: 2014-10-14 | Discharge: 2014-10-18 | DRG: 669 | Disposition: A | Discharge status: Home / Self Care | Payer: Medicare Other | Source: Other Acute Inpatient Hospital | Attending: Internal Medicine | Admitting: Internal Medicine

## 2014-10-14 ENCOUNTER — Other Ambulatory Visit: Payer: Self-pay

## 2014-10-14 ENCOUNTER — Inpatient Hospital Stay (HOSPITAL_COMMUNITY): Payer: Medicare Other

## 2014-10-14 DIAGNOSIS — I429 Cardiomyopathy, unspecified: Secondary | ICD-10-CM | POA: Diagnosis present

## 2014-10-14 DIAGNOSIS — E1122 Type 2 diabetes mellitus with diabetic chronic kidney disease: Secondary | ICD-10-CM | POA: Diagnosis present

## 2014-10-14 DIAGNOSIS — Z6835 Body mass index (BMI) 35.0-35.9, adult: Secondary | ICD-10-CM | POA: Diagnosis not present

## 2014-10-14 DIAGNOSIS — I5022 Chronic systolic (congestive) heart failure: Secondary | ICD-10-CM | POA: Diagnosis present

## 2014-10-14 DIAGNOSIS — I129 Hypertensive chronic kidney disease with stage 1 through stage 4 chronic kidney disease, or unspecified chronic kidney disease: Secondary | ICD-10-CM | POA: Diagnosis not present

## 2014-10-14 DIAGNOSIS — B9561 Methicillin susceptible Staphylococcus aureus infection as the cause of diseases classified elsewhere: Secondary | ICD-10-CM | POA: Diagnosis present

## 2014-10-14 DIAGNOSIS — Z23 Encounter for immunization: Secondary | ICD-10-CM

## 2014-10-14 DIAGNOSIS — E669 Obesity, unspecified: Secondary | ICD-10-CM | POA: Diagnosis not present

## 2014-10-14 DIAGNOSIS — D494 Neoplasm of unspecified behavior of bladder: Secondary | ICD-10-CM

## 2014-10-14 DIAGNOSIS — I4892 Unspecified atrial flutter: Secondary | ICD-10-CM | POA: Diagnosis present

## 2014-10-14 DIAGNOSIS — N182 Chronic kidney disease, stage 2 (mild): Secondary | ICD-10-CM | POA: Diagnosis present

## 2014-10-14 DIAGNOSIS — Z79899 Other long term (current) drug therapy: Secondary | ICD-10-CM | POA: Diagnosis not present

## 2014-10-14 DIAGNOSIS — Z95810 Presence of automatic (implantable) cardiac defibrillator: Secondary | ICD-10-CM

## 2014-10-14 DIAGNOSIS — L97411 Non-pressure chronic ulcer of right heel and midfoot limited to breakdown of skin: Secondary | ICD-10-CM | POA: Diagnosis not present

## 2014-10-14 DIAGNOSIS — I428 Other cardiomyopathies: Secondary | ICD-10-CM | POA: Insufficient documentation

## 2014-10-14 DIAGNOSIS — Z794 Long term (current) use of insulin: Secondary | ICD-10-CM

## 2014-10-14 DIAGNOSIS — N390 Urinary tract infection, site not specified: Secondary | ICD-10-CM | POA: Diagnosis present

## 2014-10-14 DIAGNOSIS — L03119 Cellulitis of unspecified part of limb: Secondary | ICD-10-CM

## 2014-10-14 DIAGNOSIS — C679 Malignant neoplasm of bladder, unspecified: Secondary | ICD-10-CM | POA: Insufficient documentation

## 2014-10-14 DIAGNOSIS — I4891 Unspecified atrial fibrillation: Secondary | ICD-10-CM | POA: Diagnosis not present

## 2014-10-14 DIAGNOSIS — L03115 Cellulitis of right lower limb: Secondary | ICD-10-CM | POA: Diagnosis not present

## 2014-10-14 DIAGNOSIS — R319 Hematuria, unspecified: Secondary | ICD-10-CM | POA: Diagnosis present

## 2014-10-14 DIAGNOSIS — D303 Benign neoplasm of bladder: Secondary | ICD-10-CM | POA: Diagnosis present

## 2014-10-14 DIAGNOSIS — E785 Hyperlipidemia, unspecified: Secondary | ICD-10-CM | POA: Diagnosis present

## 2014-10-14 DIAGNOSIS — M1A9XX0 Chronic gout, unspecified, without tophus (tophi): Secondary | ICD-10-CM | POA: Diagnosis present

## 2014-10-14 DIAGNOSIS — G4733 Obstructive sleep apnea (adult) (pediatric): Secondary | ICD-10-CM | POA: Diagnosis not present

## 2014-10-14 DIAGNOSIS — I447 Left bundle-branch block, unspecified: Secondary | ICD-10-CM | POA: Diagnosis present

## 2014-10-14 DIAGNOSIS — L97221 Non-pressure chronic ulcer of left calf limited to breakdown of skin: Secondary | ICD-10-CM | POA: Diagnosis not present

## 2014-10-14 DIAGNOSIS — L0390 Cellulitis, unspecified: Secondary | ICD-10-CM | POA: Insufficient documentation

## 2014-10-14 DIAGNOSIS — M86671 Other chronic osteomyelitis, right ankle and foot: Secondary | ICD-10-CM

## 2014-10-14 DIAGNOSIS — J9811 Atelectasis: Secondary | ICD-10-CM

## 2014-10-14 LAB — APTT: aPTT: 51 seconds — ABNORMAL HIGH (ref 24–37)

## 2014-10-14 LAB — GLUCOSE, CAPILLARY: Glucose-Capillary: 187 mg/dL — ABNORMAL HIGH (ref 70–99)

## 2014-10-14 LAB — MRSA PCR SCREENING: MRSA by PCR: NEGATIVE

## 2014-10-14 MED ORDER — CHLORHEXIDINE GLUCONATE 0.12 % MT SOLN
15.0000 mL | Freq: Two times a day (BID) | OROMUCOSAL | Status: DC
  Administered 2014-10-14 – 2014-10-18 (×8): 15 mL via OROMUCOSAL
  Filled 2014-10-14 (×8): qty 15

## 2014-10-14 MED ORDER — CLONIDINE HCL 0.1 MG PO TABS
0.2000 mg | ORAL_TABLET | Freq: Two times a day (BID) | ORAL | Status: DC
Start: 2014-10-14 — End: 2014-10-18
  Administered 2014-10-14 – 2014-10-18 (×8): 0.2 mg via ORAL
  Filled 2014-10-14 (×8): qty 2

## 2014-10-14 MED ORDER — DILTIAZEM HCL 100 MG IV SOLR
5.0000 mg/h | INTRAVENOUS | Status: DC
  Administered 2014-10-14: 10 mg/h via INTRAVENOUS
  Filled 2014-10-14: qty 100

## 2014-10-14 MED ORDER — INSULIN ASPART 100 UNIT/ML ~~LOC~~ SOLN
0.0000 [IU] | Freq: Every day | SUBCUTANEOUS | Status: DC
  Administered 2014-10-16: 3 [IU] via SUBCUTANEOUS

## 2014-10-14 MED ORDER — DIGOXIN 125 MCG PO TABS
0.1250 mg | ORAL_TABLET | Freq: Every day | ORAL | Status: DC
  Administered 2014-10-14 – 2014-10-18 (×5): 0.125 mg via ORAL
  Filled 2014-10-14 (×5): qty 1

## 2014-10-14 MED ORDER — PRAZOSIN HCL 1 MG PO CAPS
1.0000 mg | ORAL_CAPSULE | Freq: Two times a day (BID) | ORAL | Status: DC
  Administered 2014-10-14 – 2014-10-18 (×8): 1 mg via ORAL
  Filled 2014-10-14 (×11): qty 1

## 2014-10-14 MED ORDER — ATORVASTATIN CALCIUM 10 MG PO TABS
10.0000 mg | ORAL_TABLET | Freq: Every day | ORAL | Status: DC
  Administered 2014-10-14 – 2014-10-18 (×5): 10 mg via ORAL
  Filled 2014-10-14 (×5): qty 1

## 2014-10-14 MED ORDER — METOPROLOL TARTRATE 50 MG PO TABS
100.0000 mg | ORAL_TABLET | Freq: Two times a day (BID) | ORAL | Status: DC
  Administered 2014-10-14 – 2014-10-18 (×8): 100 mg via ORAL
  Filled 2014-10-14 (×2): qty 4
  Filled 2014-10-14: qty 2
  Filled 2014-10-14 (×2): qty 4
  Filled 2014-10-14: qty 2
  Filled 2014-10-14: qty 4
  Filled 2014-10-14: qty 2

## 2014-10-14 MED ORDER — ALLOPURINOL 100 MG PO TABS
100.0000 mg | ORAL_TABLET | Freq: Every day | ORAL | Status: DC
  Administered 2014-10-14 – 2014-10-18 (×5): 100 mg via ORAL
  Filled 2014-10-14 (×5): qty 1

## 2014-10-14 MED ORDER — AMIODARONE HCL 200 MG PO TABS
200.0000 mg | ORAL_TABLET | Freq: Two times a day (BID) | ORAL | Status: DC
  Administered 2014-10-14 – 2014-10-18 (×8): 200 mg via ORAL
  Filled 2014-10-14 (×8): qty 1

## 2014-10-14 MED ORDER — CETYLPYRIDINIUM CHLORIDE 0.05 % MT LIQD
7.0000 mL | Freq: Two times a day (BID) | OROMUCOSAL | Status: DC
  Administered 2014-10-15 – 2014-10-18 (×4): 7 mL via OROMUCOSAL

## 2014-10-14 MED ORDER — GEMFIBROZIL 600 MG PO TABS
600.0000 mg | ORAL_TABLET | Freq: Two times a day (BID) | ORAL | Status: DC
  Administered 2014-10-15 – 2014-10-18 (×8): 600 mg via ORAL
  Filled 2014-10-14 (×9): qty 1

## 2014-10-14 MED ORDER — ACETAMINOPHEN 325 MG PO TABS: 650.0000 mg | ORAL_TABLET | Freq: Four times a day (QID) | ORAL | Status: DC | PRN

## 2014-10-14 MED ORDER — INSULIN ASPART 100 UNIT/ML ~~LOC~~ SOLN
0.0000 [IU] | Freq: Three times a day (TID) | SUBCUTANEOUS | Status: DC
Start: 2014-10-15 — End: 2014-10-16
  Administered 2014-10-15: 4 [IU] via SUBCUTANEOUS
  Administered 2014-10-15: 2 [IU] via SUBCUTANEOUS
  Administered 2014-10-16: 4 [IU] via SUBCUTANEOUS

## 2014-10-14 MED ORDER — IRBESARTAN 150 MG PO TABS
150.0000 mg | ORAL_TABLET | Freq: Every day | ORAL | Status: DC
  Administered 2014-10-14 – 2014-10-18 (×5): 150 mg via ORAL
  Filled 2014-10-14 (×5): qty 1

## 2014-10-14 MED ORDER — PIPERACILLIN-TAZOBACTAM 3.375 G IVPB
3.3750 g | Freq: Three times a day (TID) | INTRAVENOUS | Status: DC
  Administered 2014-10-14 – 2014-10-16 (×6): 3.375 g via INTRAVENOUS
  Filled 2014-10-14 (×6): qty 50

## 2014-10-14 NOTE — Progress Notes (Signed)
ANTIBIOTIC CONSULT NOTE - INITIAL  Pharmacy Consult for Zosyn Indication: wound infection  No Known Allergies  Patient Measurements: Height: 5\' 11"  (180.3 cm) Weight: 259 lb 14.8 oz (117.9 kg) IBW/kg (Calculated) : 75.3   Vital Signs: Temp: 97.9 F (36.6 C) (02/28 1600) Temp Source: Oral (02/28 1600) BP: 137/74 mmHg (02/28 1742) Pulse Rate: 115 (02/28 1823) Intake/Output from previous day:   Intake/Output from this shift: Total I/O In: 3000 [Other:3000] Out: -   Labs: No results for input(s): WBC, HGB, PLT, LABCREA, CREATININE in the last 72 hours. CrCl cannot be calculated (Patient has no serum creatinine result on file.). No results for input(s): VANCOTROUGH, VANCOPEAK, VANCORANDOM, GENTTROUGH, GENTPEAK, GENTRANDOM, TOBRATROUGH, TOBRAPEAK, TOBRARND, AMIKACINPEAK, AMIKACINTROU, AMIKACIN in the last 72 hours.   Microbiology: No results found for this or any previous visit (from the past 720 hour(s)).  Medical History: Past Medical History  Diagnosis Date  . Hypertension   . Morbid obesity   . Atrial flutter  10/01/2006, 12/30/11    Status post RF ablation, 7/13, Dr. [name]  . Type II or unspecified type diabetes mellitus without mention of complication, not stated as uncontrolled   . OSA (obstructive sleep apnea)     compliant with CPAP  . Nonischemic cardiomyopathy     Normal coronary arteries, 2008  . Chronic systolic dysfunction of left ventricle     EF 20-25%, global HK; mild RVD, 2-D echo, 5/13  . Unspecified disorder of kidney and ureter   . RBBB, anterior fascicular block and incomplete LBBB   . Pulmonary hypertension     RVSP 50-55 mmHg, 5/13  . 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 He has complex cardiac hx and felt to be high risk for surgery at [facility].  He has wounds on both his feet, pharmacy consulted to dose zosyn.  Labs from [place] Scr  1.74, CrCl ~ 11mls/min   Goal of Therapy:  Zosyn per renal function  Plan:  Zosyn 3.375mg  IV q8h EI Follow renal function, cultures, clinical course  Dolly Rias [facility] 10/14/2014, 6:28 PM Pager [phone]

## 2014-10-14 NOTE — Consult Note (Signed)
WOC wound consult note Reason for Consult:WOund consutl received for patient's bilateral feet.  R>L. Wound type:neuropathic vs trauma vs infectious Pressure Ulcer POA: No Measurement:Right midfoot:  5cm x 13cm area of involvement for which wound bed is 80% obscured by the presence of necrotic tissue.  In the 20% of the wound that is visible to the base, an area measuring 2cm x 4cm x 0.5cm depth, the wound bed is red with yellow slough and draining light yellow exudate with a strong odor.  It is difficult to ascertain if the odor is from the necrotic tissue or due to lack of hygiene at this initial encounter.   Left posterior LE (Achilles region) presents with a partial thickness ulcer measuring 76mc x 3.5cm x 0.2cm.  It is beefy red and moist with scant serous exudate.  A left lateral heel callous measures 2cm x 2cm round. Wound bed:As described above. Drainage (amount, consistency, odor) As described above. Periwound:Intact, dry and with evidence of poor hygiene. Dressing procedure/placement/frequency: I will provide bilateral pressure redistribution boots (Prevalon) and conservative wound care orders for the nursing staff at this time consisting of NS continually moist dressings to the right mid-foot.  I have discussed with Dr. Halford Chessman and he will order xray studies tonight and potentially consult with orthopedics for this wound.  The left posterior LE (Achilles region) wound will be dressed with a white petrolatum gauze dressing changed daily and the foot placed into a pressure redistribution boot.  The heel of the left foot will benefit from daily hygiene including moisturizing and pressure redistribution. Additionally, the boot has a wedge that will keep his foot in alignment and prohibit the lateral rotation which is contribution to the callous formation. [place] nursing team will not follow, but will remain available to this patient, the nursing and medical teams.  Please re-consult if  needed. Thanks, Maudie Flakes, MSN, RN, [place], [place], [place] [phone])

## 2014-10-14 NOTE — H&P (Signed)
Name: CHUCKY HOMES MRN: [id number] DOB: 1961/03/15    ADMISSION DATE:  10/14/2014  REFERRING MD :  Jerene Bears  CHIEF COMPLAINT:  Hematuria  BRIEF PATIENT DESCRIPTION:  54 yo male transferred from [facility] with hematuria from bladder tumor.  He has complex cardiac hx and felt to be high risk for surgery at [facility].  SIGNIFICANT EVENTS  2/25 Admit to [facility] 2/28 Transfer to [facility]  STUDIES:  04/24/14 Echo >> EF 35%, diffuse hypokinesis 10/11/14 CT abd/pelvis  from [facility] >> 3 mm LLL nodule, mild/mod Rt hydronephrosis, distended bladder, mild perinephric stranding b/l  HISTORY OF PRESENT ILLNESS:   54 yo male on pradaxa as outpt for a fib presented to [place] with painless hematuria.  He described this as frank blood.  He was seen by urology (Dr. Exie Parody) at [facility] and started on bladder irrigation.  Cystoscopy showed posterior wall bladder tumor.  It was recommended that he be transferred to [facility] to have further cardiac assessment prior to having additional urology intervention.  He had urine culture which showed Staph aureus >> MSSA.  While at [facility] he developed RVR from his A fib and started on cardizem gtt.  He denies chest pain, palpitations, fever, dyspnea, or abdominal pain.  He has wounds on both his feet >> Rt worse than Lt.  These have been present for at least 1 month.  He never sought attention for these.  PAST MEDICAL HISTORY :   has a past medical history of Hypertension; Morbid obesity; Atrial flutter ( 10/01/2006, 12/30/11); Type II or unspecified type diabetes mellitus without mention of complication, not stated as uncontrolled; OSA (obstructive sleep apnea); Nonischemic cardiomyopathy; Chronic systolic dysfunction of left ventricle; Unspecified disorder of kidney and ureter; RBBB, anterior fascicular block and incomplete LBBB; Pulmonary hypertension; and Valvular heart disease.  has past surgical history that includes Bi-ventricular implantable  cardioverter defibrillator  (crt-d) (10/27/2013); Atrial flutter ablation (N/A, 03/04/2012); and bi-ventricular implantable cardioverter defibrillator (N/A, 10/27/2013). Prior to Admission medications   Medication Sig Start Date End Date Taking? Authorizing Provider  allopurinol (ZYLOPRIM) 300 MG tablet Take 300 mg by mouth daily.    Historical Provider, MD  atorvastatin (LIPITOR) 10 MG tablet Take 10 mg by mouth daily.      Historical Provider, MD  Canagliflozin (INVOKANA) 300 MG TABS Take 300 mg by mouth daily.    Historical Provider, MD  cloNIDine (CATAPRES) 0.2 MG tablet Take 0.2 mg by mouth 2 (two) times daily.      Historical Provider, MD  COLCRYS 0.6 MG tablet Take 0.6 mg by mouth daily as needed (gout).  12/18/11   Historical Provider, MD  dabigatran (PRADAXA) 150 MG CAPS capsule Take 1 capsule (150 mg total) by mouth 2 (two) times daily. 11/14/13   Thompson Grayer, MD  digoxin (LANOXIN) 0.25 MG tablet Take 0.125 mg by mouth daily.    Historical Provider, MD  gemfibrozil (LOPID) 600 MG tablet Take 600 mg by mouth 2 (two) times daily before a meal.      Historical Provider, MD  glimepiride (AMARYL) 2 MG tablet Take 2 mg by mouth 2 (two) times daily.     Historical Provider, MD  insulin aspart (NOVOLOG) 100 UNIT/ML injection Inject 30 Units into the skin 2 (two) times daily before a meal.     Historical Provider, MD  insulin detemir (LEVEMIR) 100 UNIT/ML injection Inject 100 Units into the skin 2 (two) times daily.     Historical Provider, MD  metFORMIN (GLUCOPHAGE) 1000 MG  tablet Take 1 tablet (1,000 mg total) by mouth 2 (two) times daily with a meal. 10/29/13   Roger A Arguello, PA-C  metolazone (ZAROXOLYN) 2.5 MG tablet Take 1 tablet (2.5 mg total) by mouth daily. ( X 4 DAYS ONLY) 10/01/14   Herminio Commons, MD  metoprolol (LOPRESSOR) 100 MG tablet Take 100 mg by mouth 2 (two) times daily.      Historical Provider, MD  prazosin (MINIPRESS) 1 MG capsule Take 1 mg by mouth 2 (two) times daily.     Historical Provider, MD  prazosin (MINIPRESS) 1 MG capsule TAKE ONE CAPSULE BY MOUTH TWICE DAILY 05/10/14   Herminio Commons, MD  sitaGLIPtin (JANUVIA) 100 MG tablet Take 100 mg by mouth daily.    Historical Provider, MD  spironolactone (ALDACTONE) 25 MG tablet TAKE ONE TABLET BY MOUTH ONCE DAILY 05/01/14   Herminio Commons, MD  torsemide (DEMADEX) 20 MG tablet Take 1 tablet (20 mg total) by mouth 2 (two) times daily. 04/13/14   Herminio Commons, MD  valsartan (DIOVAN) 160 MG tablet Take 160 mg by mouth daily.    Historical Provider, MD   No Known Allergies  FAMILY HISTORY:  family history includes Asthma (age of onset: 6) in his father; Diabetes in an other family member. SOCIAL HISTORY:  reports that he has never smoked. He has never used smokeless tobacco. He reports that he does not drink alcohol or use illicit drugs.  REVIEW OF SYSTEMS:   Negative except above  SUBJECTIVE:   VITAL SIGNS: Pulse Rate:  [101] 101 (02/28 1742) Resp:  [15] 15 (02/28 1742) BP: (131-137)/(74-86) 137/74 mmHg (02/28 1742) SpO2:  [96 %] 96 % (02/28 1742)  PHYSICAL EXAMINATION: General: Pleasant Neuro:  Normal strength, CN intact HEENT:  Pupils reactive, no LAN Cardiovascular:  Irregular, tachycardic, no murmur Lungs:  No wheeze/rales Abdomen:  Soft, non tender, +BS GU: foley in place Musculoskeletal:  Chronic stasis changes b/l lower extremities Skin: 5x13 cm wound Rt foot with yellow exudate with strong odor, 3.5 cm ulcer Lt achilles area  CBC No results for input(s): WBC, HGB, HCT, PLT in the last 72 hours.  Coag's No results for input(s): APTT, INR in the last 72 hours.  BMET No results for input(s): NA, K, CL, CO2, BUN, CREATININE, GLUCOSE in the last 72 hours.  Electrolytes No results for input(s): CALCIUM, MG, PHOS in the last 72 hours.  Sepsis Markers No results for input(s): PROCALCITON, O2SATVEN in the last 72 hours.  Invalid input(s): LACTICACIDVEN  ABG No results for  input(s): PHART, PCO2ART, PO2ART in the last 72 hours.  Liver Enzymes No results for input(s): AST, ALT, ALKPHOS, BILITOT, ALBUMIN in the last 72 hours.  Cardiac Enzymes No results for input(s): TROPONINI, PROBNP in the last 72 hours.  Glucose No results for input(s): GLUCAP in the last 72 hours.  Imaging No results found.   Labs from [place] reviewed.  Significant results >> Creatinine 1.74, Glucose 269, K 5.1, CO2 17, WBC 17.2, Hb 11.2, PLT 370  CT abd/pelvis 10/11/14 from [place] >> 3 mm LLL nodule, mild/mod Rt hydronephrosis, distended bladder, mild perinephric stranding b/l  ASSESSMENT / PLAN:  Hematuria with bladder tumor noted at [facility]. Stage 2 CKD. Plan: - continue foley with bladder irrigation - will need to consult urology in AM of 2/29 to discuss treatment plan - f/u BMET  Hx of chronic systolic CHF, PM, HLD, HTN. Afib with RVR. Plan: - cardizem gtt - continue amiodarone, catapres, lipitor, digoxin,  lopid, avapro, lopressor, minipress - hold outpt pradaxa - will need cardiology evaluation in AM of 2/29 to assess prior to having any additional urology interventions  B/L foot wounds. UTI with MSSA on urine cx from [facility] 2/25. Plan: - wound care consulted - add zosyn - f/u blood cultures [facility] from 2/25 - get Xray Rt foot - wound care recommending ortho consult for Rt foot >> will need to consult ortho in AM of 2/29  DM type II with renal complications. Plan: - SSI - hold outpt oral agents for now  74mm nodule Lt lower lung on CT abd/pelvis 10/11/14. Plan: - f/u CXR   Chesley Mires, MD [place] 10/14/2014, 6:01 PM Pager:  [phone] After 3pm call: [phone]

## 2014-10-15 ENCOUNTER — Encounter (HOSPITAL_COMMUNITY): Payer: Self-pay | Admitting: Radiology

## 2014-10-15 ENCOUNTER — Other Ambulatory Visit: Payer: Self-pay | Admitting: Urology

## 2014-10-15 ENCOUNTER — Inpatient Hospital Stay (HOSPITAL_COMMUNITY): Payer: Medicare Other

## 2014-10-15 DIAGNOSIS — L03115 Cellulitis of right lower limb: Secondary | ICD-10-CM

## 2014-10-15 DIAGNOSIS — I447 Left bundle-branch block, unspecified: Secondary | ICD-10-CM

## 2014-10-15 DIAGNOSIS — I5022 Chronic systolic (congestive) heart failure: Secondary | ICD-10-CM

## 2014-10-15 DIAGNOSIS — Z01810 Encounter for preprocedural cardiovascular examination: Secondary | ICD-10-CM

## 2014-10-15 LAB — COMPREHENSIVE METABOLIC PANEL
ALBUMIN: 2.4 g/dL — AB (ref 3.5–5.2)
ALK PHOS: 166 U/L — AB (ref 39–117)
ALT: 22 U/L (ref 0–53)
AST: 22 U/L (ref 0–37)
Anion gap: 8 (ref 5–15)
BILIRUBIN TOTAL: 1 mg/dL (ref 0.3–1.2)
BUN: 62 mg/dL — AB (ref 6–23)
CALCIUM: 8.1 mg/dL — AB (ref 8.4–10.5)
CO2: 21 mmol/L (ref 19–32)
CREATININE: 1.5 mg/dL — AB (ref 0.50–1.35)
Chloride: 112 mmol/L (ref 96–112)
GFR calc non Af Amer: 51 mL/min — ABNORMAL LOW (ref >90)
GFR, EST AFRICAN AMERICAN: 59 mL/min — AB (ref >90)
GLUCOSE: 237 mg/dL — AB (ref 70–99)
POTASSIUM: 4.8 mmol/L (ref 3.5–5.1)
Sodium: 141 mmol/L (ref 135–145)
TOTAL PROTEIN: 6.3 g/dL (ref 6.0–8.3)

## 2014-10-15 LAB — CBC
HEMATOCRIT: 32.8 % — AB (ref 39.0–52.0)
Hemoglobin: 10.2 g/dL — ABNORMAL LOW (ref 13.0–17.0)
MCH: 26.8 pg (ref 26.0–34.0)
MCHC: 31.1 g/dL (ref 30.0–36.0)
MCV: 86.3 fL (ref 78.0–100.0)
PLATELETS: 294 10*3/uL (ref 150–400)
RBC: 3.8 MIL/uL — ABNORMAL LOW (ref 4.22–5.81)
RDW: 15.7 % — ABNORMAL HIGH (ref 11.5–15.5)
WBC: 12.2 10*3/uL — AB (ref 4.0–10.5)

## 2014-10-15 LAB — BRAIN NATRIURETIC PEPTIDE: B Natriuretic Peptide: 751.7 pg/mL — ABNORMAL HIGH (ref 0.0–100.0)

## 2014-10-15 LAB — GLUCOSE, CAPILLARY
GLUCOSE-CAPILLARY: 281 mg/dL — AB (ref 70–99)
Glucose-Capillary: 212 mg/dL — ABNORMAL HIGH (ref 70–99)
Glucose-Capillary: 209 mg/dL — ABNORMAL HIGH (ref 70–99)
Glucose-Capillary: 195 mg/dL — ABNORMAL HIGH (ref 70–99)

## 2014-10-15 LAB — DIGOXIN LEVEL: Digoxin Level: 0.8 ng/mL (ref 0.8–2.0)

## 2014-10-15 LAB — PROTIME-INR
INR: 1.51 — AB (ref 0.00–1.49)
PROTHROMBIN TIME: 18.4 s — AB (ref 11.6–15.2)

## 2014-10-15 LAB — PHOSPHORUS: Phosphorus: 3.3 mg/dL (ref 2.3–4.6)

## 2014-10-15 LAB — MAGNESIUM: MAGNESIUM: 3 mg/dL — AB (ref 1.5–2.5)

## 2014-10-15 MED ORDER — COLLAGENASE 250 UNIT/GM EX OINT
TOPICAL_OINTMENT | Freq: Every day | CUTANEOUS | Status: DC
  Administered 2014-10-15 – 2014-10-18 (×4): via TOPICAL
  Filled 2014-10-15 (×2): qty 30

## 2014-10-15 MED ORDER — VITAMINS A & D EX OINT
TOPICAL_OINTMENT | CUTANEOUS | Status: AC
  Administered 2014-10-16
  Filled 2014-10-15: qty 15

## 2014-10-15 NOTE — Consult Note (Addendum)
Reason for Consult: ulcerations bilateral lower extremities Referring Physician: Dr. Nunzio [name] is an 54 y.o. male.  HPI: Patient is a 54 year old gentleman with diabetes who presents with hematuria of note patient also has a ulcer on the plantar aspect of the right foot and ulcer on the posterior aspect of the left leg. Patient feels like he developed a blood blister from trying to alter the way he was walking.  Past Medical History  Diagnosis Date  . Hypertension   . Morbid obesity   . Atrial flutter  10/01/2006, 12/30/11    Status post RF ablation, 7/13, Dr. [name]  . Type II or unspecified type diabetes mellitus without mention of complication, not stated as uncontrolled   . OSA (obstructive sleep apnea)     compliant with CPAP  . Nonischemic cardiomyopathy     Normal coronary arteries, 2008  . Chronic systolic dysfunction of left ventricle     EF 20-25%, global HK; mild RVD, 2-D echo, 5/13  . Unspecified disorder of kidney and ureter   . RBBB, anterior fascicular block and incomplete LBBB   . Pulmonary hypertension     RVSP 50-55 mmHg, 5/13  . Valvular heart disease     Mild MR/TR, 2-D echo, 5/13    Past Surgical History  Procedure Laterality Date  . Bi-ventricular implantable cardioverter defibrillator  (crt-d)  10/27/2013    MDT [name] XT CRTD implanted by [name] for NICM, CHF  . Atrial flutter ablation N/A 03/04/2012    Procedure: ATRIAL FLUTTER ABLATION;  Surgeon: Thompson Grayer, MD;  Location: [facility] CATH LAB;  Service: Cardiovascular;  Laterality: N/A;  . Bi-ventricular implantable cardioverter defibrillator N/A 10/27/2013    Procedure: BI-VENTRICULAR IMPLANTABLE CARDIOVERTER DEFIBRILLATOR  (CRT-D);  Surgeon: Coralyn Mark, MD;  Location: [facility] CATH LAB;  Service: Cardiovascular;  Laterality: N/A;    Family History  Problem Relation Age of Onset  . Asthma Father 32    DECEASED  . Diabetes      Social History:  reports that he has never smoked. He has  never used smokeless tobacco. He reports that he does not drink alcohol or use illicit drugs.  Allergies: No Known Allergies  Medications: I have reviewed the patient's current medications.  Results for orders placed or performed during the hospital encounter of 10/14/14 (from the past 48 hour(s))  MRSA PCR Screening     Status: None   Collection Time: 10/14/14  5:56 PM  Result Value Ref Range   MRSA by PCR NEGATIVE NEGATIVE    Comment:        The GeneXpert MRSA Assay (FDA approved for NASAL specimens only), is one component of a comprehensive MRSA colonization surveillance program. It is not intended to diagnose MRSA infection nor to guide or monitor treatment for MRSA infections.   APTT     Status: Abnormal   Collection Time: 10/14/14  6:30 PM  Result Value Ref Range   aPTT 51 (H) 24 - 37 seconds    Comment:        IF BASELINE aPTT IS ELEVATED, SUGGEST PATIENT RISK ASSESSMENT BE USED TO DETERMINE APPROPRIATE ANTICOAGULANT THERAPY.   Glucose, capillary     Status: Abnormal   Collection Time: 10/14/14 10:05 PM  Result Value Ref Range   Glucose-Capillary 187 (H) 70 - 99 mg/dL  Comprehensive metabolic panel     Status: Abnormal   Collection Time: 10/15/14  4:13 AM  Result Value Ref Range   Sodium 141 135 -  145 mmol/L   Potassium 4.8 3.5 - 5.1 mmol/L   Chloride 112 96 - 112 mmol/L   CO2 21 19 - 32 mmol/L   Glucose, Bld 237 (H) 70 - 99 mg/dL   BUN 62 (H) 6 - 23 mg/dL   Creatinine, Ser 1.50 (H) 0.50 - 1.35 mg/dL   Calcium 8.1 (L) 8.4 - 10.5 mg/dL   Total Protein 6.3 6.0 - 8.3 g/dL   Albumin 2.4 (L) 3.5 - 5.2 g/dL   AST 22 0 - 37 U/L   ALT 22 0 - 53 U/L   Alkaline Phosphatase 166 (H) 39 - 117 U/L   Total Bilirubin 1.0 0.3 - 1.2 mg/dL   GFR calc non Af Amer 51 (L) >90 mL/min   GFR calc Af Amer 59 (L) >90 mL/min    Comment: (NOTE) The eGFR has been calculated using the CKD EPI equation. This calculation has not been validated in all clinical situations. eGFR's  persistently <90 mL/min signify possible Chronic Kidney Disease.    Anion gap 8 5 - 15  CBC     Status: Abnormal   Collection Time: 10/15/14  4:13 AM  Result Value Ref Range   WBC 12.2 (H) 4.0 - 10.5 K/uL   RBC 3.80 (L) 4.22 - 5.81 MIL/uL   Hemoglobin 10.2 (L) 13.0 - 17.0 g/dL   HCT 32.8 (L) 39.0 - 52.0 %   MCV 86.3 78.0 - 100.0 fL   MCH 26.8 26.0 - 34.0 pg   MCHC 31.1 30.0 - 36.0 g/dL   RDW 15.7 (H) 11.5 - 15.5 %   Platelets 294 150 - 400 K/uL  Magnesium     Status: Abnormal   Collection Time: 10/15/14  4:13 AM  Result Value Ref Range   Magnesium 3.0 (H) 1.5 - 2.5 mg/dL  Phosphorus     Status: None   Collection Time: 10/15/14  4:13 AM  Result Value Ref Range   Phosphorus 3.3 2.3 - 4.6 mg/dL  Protime-INR     Status: Abnormal   Collection Time: 10/15/14  4:13 AM  Result Value Ref Range   Prothrombin Time 18.4 (H) 11.6 - 15.2 seconds   INR 1.51 (H) 0.00 - 1.49  Glucose, capillary     Status: Abnormal   Collection Time: 10/15/14  7:47 AM  Result Value Ref Range   Glucose-Capillary 212 (H) 70 - 99 mg/dL   Comment 1 Notify RN   Glucose, capillary     Status: Abnormal   Collection Time: 10/15/14 12:09 PM  Result Value Ref Range   Glucose-Capillary 209 (H) 70 - 99 mg/dL   Comment 1 Notify RN   Glucose, capillary     Status: Abnormal   Collection Time: 10/15/14  4:16 PM  Result Value Ref Range   Glucose-Capillary 195 (H) 70 - 99 mg/dL    Dg Chest Port 1 View  10/15/2014   CLINICAL DATA:  Atrial flutter; hypertension  EXAM: PORTABLE CHEST - 1 VIEW  COMPARISON:  October 28, 2013  FINDINGS: Pacemaker leads are attached to the right atrium, right ventricle, and left ventricle, stable. No pneumothorax. There is cardiomegaly with mild pulmonary venous hypertension. No edema or consolidation. No effusions. No adenopathy.  IMPRESSION: Suspect a degree of chronic volume overload. Pacemaker leads unchanged. No pneumothorax.   Electronically Signed   By: Lowella Grip III M.D.   On:  10/15/2014 07:26   Dg Foot Complete Right  10/14/2014   CLINICAL DATA:  Lateral foot wound for 1 month  EXAM: RIGHT FOOT COMPLETE - 3+ VIEW  COMPARISON:  None.  FINDINGS: No acute fracture or dislocation is noted. Increased cortical thickening is noted within the fifth metatarsal as well as areas of lucency in the bases of the second through fourth metatarsals. The known lateral wound is seen. These changes are highly suspicious for underlying osteomyelitis. Calcaneal spurs are seen.  IMPRESSION: Soft tissue wound with associated bony changes suspicious for osteomyelitis. MRI may be helpful for further evaluation.   Electronically Signed   By: Inez Catalina M.D.   On: 10/14/2014 18:23    Review of Systems  All other systems reviewed and are negative.  Blood pressure 117/48, pulse 107, temperature 97.6 F (36.4 C), temperature source Oral, resp. rate 17, height 5' 11"  (1.803 m), weight 118.9 kg (262 lb 2 oz), SpO2 97 %. Physical Exam On examination patient has a palpable dorsalis pedis and posterior tibial pulse bilaterally. Patient has venous stasis changes in both legs and a venous stasis ulcer over the posterior medial aspect of the left leg. The ulcer is approximately 3 cm in diameter 1 mm deep and has good beefy granulation tissue no signs of infection. Examination of the right foot patient has a large ulcerative type blister on the plantar aspect of the right foot. After informed consent the skin and soft tissue was debrided debridement half the wound had healthy viable epithelialization beneath the ulcer. The remainder has some hard eschar which is about 4 x 6 cm. On the lateral side of the foot there is some beefy granulation tissue no purulent drainage. Review of the radiographs shows a lytic destructive change at the base of the second metatarsal while this may be consistent with osteomyelitis this does not appear consistent with patient's existing wound. Assessment/Plan: Assessment #1 venous  stasis ulceration left lower extremity. #2 large blister/ulcer plantar aspect of the right foot with lytic destructive changes of the base of the second metatarsal.  Plan: We'll have the left leg wrapped with an Unna compressive boot and this can be changed weekly. Right foot will start Santyl dressing changes over the black eschar. We will also order an CT scan to further identify the lytic lesions within the bone of the right foot. MRI scan not ordered due to patient's implanted defibrillator.  [name] V 10/15/2014, 4:54 PM

## 2014-10-15 NOTE — Progress Notes (Signed)
Name: [name] MRN: [id number] DOB: August 23, 1960    ADMISSION DATE:  10/14/2014  REFERRING MD :  Jerene Bears  CHIEF COMPLAINT:  Hematuria  BRIEF PATIENT DESCRIPTION:  54 yo male transferred from [facility] with hematuria from bladder tumor.  He has complex cardiac hx and felt to be high risk for surgery at [facility].  SIGNIFICANT EVENTS  2/25 Admit to [facility] 2/28 Transfer to [facility]  STUDIES:  04/24/14 Echo >> EF 35%, diffuse hypokinesis 10/11/14 CT abd/pelvis  from [facility] >> 3 mm LLL nodule, mild/mod Rt hydronephrosis, distended bladder, mild perinephric stranding b/l  HISTORY OF PRESENT ILLNESS:   54 yo male on pradaxa as outpt for a fib presented to [place] with painless hematuria.  He described this as frank blood.  He was seen by urology (Dr. Exie Parody) at [facility] and started on bladder irrigation.  Cystoscopy showed posterior wall bladder tumor.  It was recommended that he be transferred to [facility] to have further cardiac assessment prior to having additional urology intervention.  He had urine culture which showed Staph aureus >> MSSA.  While at [facility] he developed RVR from his A fib and started on cardizem gtt.  He denies chest pain, palpitations, fever, dyspnea, or abdominal pain.  He has wounds on both his feet >> Rt worse than Lt.  These have been present for at least 1 month.  He never sought attention for these.   SUBJECTIVE:  Feels better, denies pain, bladder irrigation ondoing  VITAL SIGNS: Temp:  [97.5 F (36.4 C)-98 F (36.7 C)] 98 F (36.7 C) (02/29 0400) Pulse Rate:  [65-115] 83 (02/29 0600) Resp:  [15-24] 17 (02/29 0600) BP: (97-157)/(50-86) 100/71 mmHg (02/29 0600) SpO2:  [92 %-98 %] 97 % (02/29 0600) Weight:  [117.9 kg (259 lb 14.8 oz)-118.9 kg (262 lb 2 oz)] 118.9 kg (262 lb 2 oz) (02/29 0600)  PHYSICAL EXAMINATION: General: Pleasant Neuro:  Normal strength, CN intact HEENT:  Pupils reactive, no LAN Cardiovascular:  Irregular,  tachycardic, no murmur Lungs:  No wheeze/rales Abdomen:  Soft, non tender, +BS GU: foley in place Musculoskeletal:  Chronic stasis changes b/l lower extremities Skin: 5x13 cm wound Rt foot with yellow exudate with strong odor, 3.5 cm ulcer Lt achilles area  CBC Recent Labs     10/15/14  0413  WBC  12.2*  HGB  10.2*  HCT  32.8*  PLT  294    Coag's Recent Labs     10/14/14  1830  10/15/14  0413  APTT  51*   --   INR   --   1.51*    BMET Recent Labs     10/15/14  0413  NA  141  K  4.8  CL  112  CO2  21  BUN  62*  CREATININE  1.50*  GLUCOSE  237*    Electrolytes Recent Labs     10/15/14  0413  CALCIUM  8.1*  MG  3.0*  PHOS  3.3    Sepsis Markers No results for input(s): PROCALCITON, O2SATVEN in the last 72 hours.  Invalid input(s): LACTICACIDVEN  ABG No results for input(s): PHART, PCO2ART, PO2ART in the last 72 hours.  Liver Enzymes Recent Labs     10/15/14  0413  AST  22  ALT  22  ALKPHOS  166*  BILITOT  1.0  ALBUMIN  2.4*    Cardiac Enzymes No results for input(s): TROPONINI, PROBNP in the last 72 hours.  Glucose Recent Labs  10/14/14  2205  10/15/14  0747  GLUCAP  187*  212*    Imaging Dg Chest Port 1 View  10/15/2014   CLINICAL DATA:  Atrial flutter; hypertension  EXAM: PORTABLE CHEST - 1 VIEW  COMPARISON:  October 28, 2013  FINDINGS: Pacemaker leads are attached to the right atrium, right ventricle, and left ventricle, stable. No pneumothorax. There is cardiomegaly with mild pulmonary venous hypertension. No edema or consolidation. No effusions. No adenopathy.  IMPRESSION: Suspect a degree of chronic volume overload. Pacemaker leads unchanged. No pneumothorax.   Electronically Signed   By: Lowella Grip III M.D.   On: 10/15/2014 07:26   Dg Foot Complete Right  10/14/2014   CLINICAL DATA:  Lateral foot wound for 1 month  EXAM: RIGHT FOOT COMPLETE - 3+ VIEW  COMPARISON:  None.  FINDINGS: No acute fracture or dislocation is noted.  Increased cortical thickening is noted within the fifth metatarsal as well as areas of lucency in the bases of the second through fourth metatarsals. The known lateral wound is seen. These changes are highly suspicious for underlying osteomyelitis. Calcaneal spurs are seen.  IMPRESSION: Soft tissue wound with associated bony changes suspicious for osteomyelitis. MRI may be helpful for further evaluation.   Electronically Signed   By: Inez Catalina M.D.   On: 10/14/2014 18:23     Labs from [place] reviewed.  Significant results >> Creatinine 1.74, Glucose 269, K 5.1, CO2 17, WBC 17.2, Hb 11.2, PLT 370  CT abd/pelvis 10/11/14 from [place] >> 3 mm LLL nodule, mild/mod Rt hydronephrosis, distended bladder, mild perinephric stranding b/l  ASSESSMENT / PLAN:  Hematuria with bladder tumor noted at [facility]. Stage 2 CKD. Plan: - continue foley with bladder irrigation - will call urology 2/29 to plan next steps in care               - f/u BMET  Hx of chronic systolic CHF, PM, HLD, HTN. Afib with RVR. Plan: - cardizem gtt, wean as able - continue amiodarone, catapres, lipitor, digoxin, lopid, avapro, lopressor, minipress - hold outpt pradaxa - will need cardiology evaluation in AM of 2/29 to assess prior to having any additional urology interventions  B/L foot wounds. R foot Xray suspicious for osteo UTI with MSSA on urine cx from [facility] 2/25. Plan: - wound care consulted - continue zosyn - f/u blood cultures [facility] from 2/25 - wound care recommending ortho consult for Rt foot, will call 2/29 - defer MRI R foot because he has a BiV pacer / ICD  DM type II with renal complications. Plan: - SSI - hold outpt oral agents for now  78mm nodule Lt lower lung on CT abd/pelvis 10/11/14. Plan: - f/u CXR  We will involve urology, cardiology and ortho in his care. Will transfer him to SDU status and to [facility]. Suspect he can go to telemetry 3/1. We will be available prn.   Baltazar Apo, MD,  PhD 10/15/2014, 11:13 AM [facility] [phone] or if no answer [phone]

## 2014-10-15 NOTE — Consult Note (Signed)
Subjective: I was asked to see [name] in consultation for a possible bladder mass and right hydro.   He had the onset last Monday night of gross hematuria with clots.  He had dysuria but no flank pain.  He was seen at [facility] ER and was found to have a probable UTI.   He had a CT that showed right hydro and a possible bladder mass.  The bladder was full despite a foley and on my review the foley appears small and the mass most consistent with a clot.  He has had no prior UTI's or stones.  He is on pradaxa for a fib.  That has been held. His UA is now clear on CBI through a large caliber 3-way foley. He reports no prior voiding complaint or GU history.  His nurse reports that he still has some blood in the urine if he strains with a BM.   ROS:  Review of Systems  Constitutional: Negative for fever and chills.  HENT: Negative.   Respiratory: Negative.   Cardiovascular: Negative.   Gastrointestinal: Negative.   Genitourinary: Positive for dysuria and hematuria. Negative for flank pain.  Musculoskeletal: Negative.   Skin: Negative.   Neurological: Negative.   Endo/Heme/Allergies: Negative.   Psychiatric/Behavioral: Negative.    No Known Allergies  Past Medical History  Diagnosis Date  . Hypertension   . Morbid obesity   . Atrial flutter  10/01/2006, 12/30/11    Status post RF ablation, 7/13, Dr. [name]  . Type II or unspecified type diabetes mellitus without mention of complication, not stated as uncontrolled   . OSA (obstructive sleep apnea)     compliant with CPAP  . Nonischemic cardiomyopathy     Normal coronary arteries, 2008  . Chronic systolic dysfunction of left ventricle     EF 20-25%, global HK; mild RVD, 2-D echo, 5/13  . Unspecified disorder of kidney and ureter   . RBBB, anterior fascicular block and incomplete LBBB   . Pulmonary hypertension     RVSP 50-55 mmHg, 5/13  . Valvular heart disease     Mild MR/TR, 2-D echo, 5/13    Past Surgical History  Procedure  Laterality Date  . Bi-ventricular implantable cardioverter defibrillator  (crt-d)  10/27/2013    MDT [name] XT CRTD implanted by [name] for NICM, CHF  . Atrial flutter ablation N/A 03/04/2012    Procedure: ATRIAL FLUTTER ABLATION;  Surgeon: Thompson Grayer, MD;  Location: [facility] CATH LAB;  Service: Cardiovascular;  Laterality: N/A;  . Bi-ventricular implantable cardioverter defibrillator N/A 10/27/2013    Procedure: BI-VENTRICULAR IMPLANTABLE CARDIOVERTER DEFIBRILLATOR  (CRT-D);  Surgeon: Coralyn Mark, MD;  Location: [facility] CATH LAB;  Service: Cardiovascular;  Laterality: N/A;    History   Social History  . Marital Status: Married    Spouse Name: N/A  . Number of Children: N/A  . Years of Education: N/A   Occupational History  . DISABLED    Social History Main Topics  . Smoking status: Never Smoker   . Smokeless tobacco: Never Used  . Alcohol Use: No  . Drug Use: No  . Sexual Activity: Not on file   Other Topics Concern  . Not on file   Social History Narrative   Pt lives in [place] alone.  Disabled.  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    Family History  Problem Relation Age of Onset  . Asthma Father 87    DECEASED  .  Diabetes      Anti-infectives: Anti-infectives    Start     Dose/Rate Route Frequency Ordered Stop   10/14/14 1900  piperacillin-tazobactam (ZOSYN) IVPB 3.375 g     3.375 g 12.5 mL/hr over 240 Minutes Intravenous Every 8 hours 10/14/14 1829        Current Facility-Administered Medications  Medication Dose Route Frequency Provider Last Rate Last Dose  . acetaminophen (TYLENOL) tablet 650 mg  650 mg Oral Q6H PRN Chesley Mires, MD      . allopurinol (ZYLOPRIM) tablet 100 mg  100 mg Oral Daily Chesley Mires, MD   100 mg at 10/15/14 1119  . amiodarone (PACERONE) tablet 200 mg  200 mg Oral BID Chesley Mires, MD   200 mg at 10/15/14 1120  . antiseptic oral rinse (CPC / CETYLPYRIDINIUM CHLORIDE 0.05%) solution 7 mL  7 mL Mouth Rinse q12n4p Chesley Mires,  MD      . atorvastatin (LIPITOR) tablet 10 mg  10 mg Oral q1800 Chesley Mires, MD   10 mg at 10/14/14 1823  . chlorhexidine (PERIDEX) 0.12 % solution 15 mL  15 mL Mouth Rinse BID Chesley Mires, MD   15 mL at 10/15/14 0830  . cloNIDine (CATAPRES) tablet 0.2 mg  0.2 mg Oral BID Chesley Mires, MD   0.2 mg at 10/15/14 1117  . digoxin (LANOXIN) tablet 0.125 mg  0.125 mg Oral Daily Chesley Mires, MD   0.125 mg at 10/15/14 1118  . diltiazem (CARDIZEM) 100 mg in dextrose 5 % 100 mL (1 mg/mL) infusion  5-15 mg/hr Intravenous Titrated Chesley Mires, MD   Stopped at 10/15/14 0000  . gemfibrozil (LOPID) tablet 600 mg  600 mg Oral BID AC Chesley Mires, MD   600 mg at 10/15/14 0830  . insulin aspart (novoLOG) injection 0-20 Units  0-20 Units Subcutaneous TID WC Chesley Mires, MD   2 Units at 10/15/14 [id number]  . insulin aspart (novoLOG) injection 0-5 Units  0-5 Units Subcutaneous QHS Chesley Mires, MD   0 Units at 10/14/14 2200  . irbesartan (AVAPRO) tablet 150 mg  150 mg Oral Daily Chesley Mires, MD   150 mg at 10/15/14 1118  . metoprolol tartrate (LOPRESSOR) tablet 100 mg  100 mg Oral BID Chesley Mires, MD   100 mg at 10/15/14 1119  . piperacillin-tazobactam (ZOSYN) IVPB 3.375 g  3.375 g Intravenous Q8H Angela Adam, RPH   3.375 g at 10/15/14 1121  . prazosin (MINIPRESS) capsule 1 mg  1 mg Oral BID Chesley Mires, MD   1 mg at 10/15/14 1117     Objective: Vital signs in last 24 hours: Temp:  [97.5 F (36.4 C)-98 F (36.7 C)] 97.6 F (36.4 C) (02/29 1210) Pulse Rate:  [65-115] 107 (02/29 1119) Resp:  [15-24] 17 (02/29 0600) BP: (97-157)/(48-86) 117/48 mmHg (02/29 1119) SpO2:  [92 %-98 %] 97 % (02/29 0600) Weight:  [117.9 kg (259 lb 14.8 oz)-118.9 kg (262 lb 2 oz)] 118.9 kg (262 lb 2 oz) (02/29 0600)  Intake/Output from previous day: 02/28 0701 - 02/29 0700 In: 29155.5 [I.V.:55.5; IV Piggyback:100] Out: 32202 [Urine:37900] Intake/Output this shift: Total I/O In: 3450 [P.O.:450; Other:3000] Out: 10150  [Urine:10150]   Physical Exam  Constitutional: He is oriented to person, place, and time and well-developed, well-nourished, and in no distress.  HENT:  Head: Normocephalic and atraumatic.  Neck: Normal range of motion. Neck supple. No thyromegaly present.  Cardiovascular: Normal rate, regular rhythm and normal heart sounds.   No murmur heard. Pulmonary/Chest:  Effort normal and breath sounds normal.  Abdominal: Soft. Bowel sounds are normal. He exhibits no distension and no mass. There is tenderness (mild RLQ and flank tenderness). There is no guarding.  He is obese  Genitourinary:  He has a normal phallus with a foley draining clear urine on CBI.  The scrotum and testes are normal  He has normal rectal tone with no masses.  The prostate is 2+ in size and benign.  SV's non-palpable.   Musculoskeletal: Normal range of motion. He exhibits no edema or tenderness.  Lymphadenopathy:    He has no cervical adenopathy.  Neurological: He is alert and oriented to person, place, and time.  Skin: Skin is warm and dry.  Psychiatric: Mood and affect normal.  Vitals reviewed.   Lab Results:   Recent Labs  10/15/14 0413  WBC 12.2*  HGB 10.2*  HCT 32.8*  PLT 294   BMET  Recent Labs  10/15/14 0413  NA 141  K 4.8  CL 112  CO2 21  GLUCOSE 237*  BUN 62*  CREATININE 1.50*  CALCIUM 8.1*   PT/INR  Recent Labs  10/15/14 0413  LABPROT 18.4*  INR 1.51*   ABG No results for input(s): PHART, HCO3 in the last 72 hours.  Invalid input(s): PCO2, PO2  Studies/Results: Dg Chest Port 1 View  10/15/2014   CLINICAL DATA:  Atrial flutter; hypertension  EXAM: PORTABLE CHEST - 1 VIEW  COMPARISON:  October 28, 2013  FINDINGS: Pacemaker leads are attached to the right atrium, right ventricle, and left ventricle, stable. No pneumothorax. There is cardiomegaly with mild pulmonary venous hypertension. No edema or consolidation. No effusions. No adenopathy.  IMPRESSION: Suspect a degree of chronic  volume overload. Pacemaker leads unchanged. No pneumothorax.   Electronically Signed   By: Lowella Grip III M.D.   On: 10/15/2014 07:26   Dg Foot Complete Right  10/14/2014   CLINICAL DATA:  Lateral foot wound for 1 month  EXAM: RIGHT FOOT COMPLETE - 3+ VIEW  COMPARISON:  None.  FINDINGS: No acute fracture or dislocation is noted. Increased cortical thickening is noted within the fifth metatarsal as well as areas of lucency in the bases of the second through fourth metatarsals. The known lateral wound is seen. These changes are highly suspicious for underlying osteomyelitis. Calcaneal spurs are seen.  IMPRESSION: Soft tissue wound with associated bony changes suspicious for osteomyelitis. MRI may be helpful for further evaluation.   Electronically Signed   By: Inez Catalina M.D.   On: 10/14/2014 18:23   I have reviewed his past history and meds and his CT from [place] as well as recent labs. I have discussed his case with Dr. Halford Chessman.    Assessment: He had gross hematuria of uncertain etiology with right hydro but no obvious stones.  His urine is now clear.  He could have just had a spontaneous bleed secondary to a UTI and pradaxa or he may have an actual bladder mass contributing to the right hydro and bleeding.   His CT scan from [facility] seemed to be of very poor quality with only injection films and no delays.   Plan: I think he can have a trial without the catheter since the urine is clear and he has no prior voiding symptoms.  I will get him set up for cystoscopy with retrograde pyelography and possible biopsy/TURBT sometime in the near future.   CC: Dr. Chesley Mires.    LOS: 1 day    Jacarius Handel J 10/15/2014

## 2014-10-15 NOTE — Consult Note (Signed)
Reason for Consult: PreOp Clearance Referring Physician: Iverson Alamin [name] is an 54 y.o. male.  HPI:   The patient is a 54 yo male with a history of chronic systolic heart failure and has a CRT-D system in place. Echocardiogram performed on 04/24/14 demonstrated moderate to severely reduced left ventricular systolic function, EF 09%, grade 3 diastolic dysfunction and aortic sclerosis but no significant stenosis. He also has a history of Aflutter, HTN, obesity, DMII, OSA, NISCM, Pulmonary HTN  He last saw Dr Jacinta Shoe on 10/01/14.  At that time he had been having increasing exertional dyspnea, abdominal distention, bilateral leg swelling, and generalized weakness for about a month. As per his home scale, he had put on 20-30 pounds in one month. On 04/03/14 he weight 282 pounds at his office visit  He weighed 304 pounds.   Torsemide was increased and metolazone added for 4 days.  He was scheduled for an echo.   He was transferred from [facility] with hematuria.  Cystoscopy revealed posterior wall bladder tumor.  His weight has decreased substantially since 2/15.  Weight now 262.  40 # drop.  The patient denies SOB, CP, orthopnea, LEE.  He usually carries his fluid in his abdomen.     Past Medical History  Diagnosis Date  . Hypertension   . Morbid obesity   . Atrial flutter  10/01/2006, 12/30/11    Status post RF ablation, 7/13, Dr. [name]  . Type II or unspecified type diabetes mellitus without mention of complication, not stated as uncontrolled   . OSA (obstructive sleep apnea)     compliant with CPAP  . Nonischemic cardiomyopathy     Normal coronary arteries, 2008  . Chronic systolic dysfunction of left ventricle     EF 20-25%, global HK; mild RVD, 2-D echo, 5/13  . Unspecified disorder of kidney and ureter   . RBBB, anterior fascicular block and incomplete LBBB   . Pulmonary hypertension     RVSP 50-55 mmHg, 5/13  . Valvular heart disease     Mild MR/TR, 2-D echo, 5/13     Past Surgical History  Procedure Laterality Date  . Bi-ventricular implantable cardioverter defibrillator  (crt-d)  10/27/2013    MDT [name] XT CRTD implanted by [name] for NICM, CHF  . Atrial flutter ablation N/A 03/04/2012    Procedure: ATRIAL FLUTTER ABLATION;  Surgeon: Thompson Grayer, MD;  Location: [facility] CATH LAB;  Service: Cardiovascular;  Laterality: N/A;  . Bi-ventricular implantable cardioverter defibrillator N/A 10/27/2013    Procedure: BI-VENTRICULAR IMPLANTABLE CARDIOVERTER DEFIBRILLATOR  (CRT-D);  Surgeon: Coralyn Mark, MD;  Location: [facility] CATH LAB;  Service: Cardiovascular;  Laterality: N/A;    Family History  Problem Relation Age of Onset  . Asthma Father 33    DECEASED  . Diabetes      Social History:  reports that he has never smoked. He has never used smokeless tobacco. He reports that he does not drink alcohol or use illicit drugs.  Allergies: No Known Allergies  Medications:  Scheduled Meds: . allopurinol  100 mg Oral Daily  . amiodarone  200 mg Oral BID  . antiseptic oral rinse  7 mL Mouth Rinse q12n4p  . atorvastatin  10 mg Oral q1800  . chlorhexidine  15 mL Mouth Rinse BID  . cloNIDine  0.2 mg Oral BID  . digoxin  0.125 mg Oral Daily  . gemfibrozil  600 mg Oral BID AC  . insulin aspart  0-20 Units Subcutaneous  TID WC  . insulin aspart  0-5 Units Subcutaneous QHS  . irbesartan  150 mg Oral Daily  . metoprolol tartrate  100 mg Oral BID  . piperacillin-tazobactam (ZOSYN)  IV  3.375 g Intravenous Q8H  . prazosin  1 mg Oral BID   Continuous Infusions: . diltiazem (CARDIZEM) infusion Stopped (10/15/14 0000)   PRN Meds:.acetaminophen   Results for orders placed or performed during the hospital encounter of 10/14/14 (from the past 48 hour(s))  MRSA PCR Screening     Status: None   Collection Time: 10/14/14  5:56 PM  Result Value Ref Range   MRSA by PCR NEGATIVE NEGATIVE    Comment:        The GeneXpert MRSA Assay (FDA approved for NASAL  specimens only), is one component of a comprehensive MRSA colonization surveillance program. It is not intended to diagnose MRSA infection nor to guide or monitor treatment for MRSA infections.   APTT     Status: Abnormal   Collection Time: 10/14/14  6:30 PM  Result Value Ref Range   aPTT 51 (H) 24 - 37 seconds    Comment:        IF BASELINE aPTT IS ELEVATED, SUGGEST PATIENT RISK ASSESSMENT BE USED TO DETERMINE APPROPRIATE ANTICOAGULANT THERAPY.   Glucose, capillary     Status: Abnormal   Collection Time: 10/14/14 10:05 PM  Result Value Ref Range   Glucose-Capillary 187 (H) 70 - 99 mg/dL  Comprehensive metabolic panel     Status: Abnormal   Collection Time: 10/15/14  4:13 AM  Result Value Ref Range   Sodium 141 135 - 145 mmol/L   Potassium 4.8 3.5 - 5.1 mmol/L   Chloride 112 96 - 112 mmol/L   CO2 21 19 - 32 mmol/L   Glucose, Bld 237 (H) 70 - 99 mg/dL   BUN 62 (H) 6 - 23 mg/dL   Creatinine, Ser 1.50 (H) 0.50 - 1.35 mg/dL   Calcium 8.1 (L) 8.4 - 10.5 mg/dL   Total Protein 6.3 6.0 - 8.3 g/dL   Albumin 2.4 (L) 3.5 - 5.2 g/dL   AST 22 0 - 37 U/L   ALT 22 0 - 53 U/L   Alkaline Phosphatase 166 (H) 39 - 117 U/L   Total Bilirubin 1.0 0.3 - 1.2 mg/dL   GFR calc non Af Amer 51 (L) >90 mL/min   GFR calc Af Amer 59 (L) >90 mL/min    Comment: (NOTE) The eGFR has been calculated using the CKD EPI equation. This calculation has not been validated in all clinical situations. eGFR's persistently <90 mL/min signify possible Chronic Kidney Disease.    Anion gap 8 5 - 15  CBC     Status: Abnormal   Collection Time: 10/15/14  4:13 AM  Result Value Ref Range   WBC 12.2 (H) 4.0 - 10.5 K/uL   RBC 3.80 (L) 4.22 - 5.81 MIL/uL   Hemoglobin 10.2 (L) 13.0 - 17.0 g/dL   HCT 32.8 (L) 39.0 - 52.0 %   MCV 86.3 78.0 - 100.0 fL   MCH 26.8 26.0 - 34.0 pg   MCHC 31.1 30.0 - 36.0 g/dL   RDW 15.7 (H) 11.5 - 15.5 %   Platelets 294 150 - 400 K/uL  Magnesium     Status: Abnormal   Collection Time:  10/15/14  4:13 AM  Result Value Ref Range   Magnesium 3.0 (H) 1.5 - 2.5 mg/dL  Phosphorus     Status: None   Collection Time:  10/15/14  4:13 AM  Result Value Ref Range   Phosphorus 3.3 2.3 - 4.6 mg/dL  Protime-INR     Status: Abnormal   Collection Time: 10/15/14  4:13 AM  Result Value Ref Range   Prothrombin Time 18.4 (H) 11.6 - 15.2 seconds   INR 1.51 (H) 0.00 - 1.49  Glucose, capillary     Status: Abnormal   Collection Time: 10/15/14  7:47 AM  Result Value Ref Range   Glucose-Capillary 212 (H) 70 - 99 mg/dL   Comment 1 Notify RN   Glucose, capillary     Status: Abnormal   Collection Time: 10/15/14 12:09 PM  Result Value Ref Range   Glucose-Capillary 209 (H) 70 - 99 mg/dL   Comment 1 Notify RN     Dg Chest Port 1 View  10/15/2014   CLINICAL DATA:  Atrial flutter; hypertension  EXAM: PORTABLE CHEST - 1 VIEW  COMPARISON:  October 28, 2013  FINDINGS: Pacemaker leads are attached to the right atrium, right ventricle, and left ventricle, stable. No pneumothorax. There is cardiomegaly with mild pulmonary venous hypertension. No edema or consolidation. No effusions. No adenopathy.  IMPRESSION: Suspect a degree of chronic volume overload. Pacemaker leads unchanged. No pneumothorax.   Electronically Signed   By: Lowella Grip III M.D.   On: 10/15/2014 07:26   Dg Foot Complete Right  10/14/2014   CLINICAL DATA:  Lateral foot wound for 1 month  EXAM: RIGHT FOOT COMPLETE - 3+ VIEW  COMPARISON:  None.  FINDINGS: No acute fracture or dislocation is noted. Increased cortical thickening is noted within the fifth metatarsal as well as areas of lucency in the bases of the second through fourth metatarsals. The known lateral wound is seen. These changes are highly suspicious for underlying osteomyelitis. Calcaneal spurs are seen.  IMPRESSION: Soft tissue wound with associated bony changes suspicious for osteomyelitis. MRI may be helpful for further evaluation.   Electronically Signed   By: Inez Catalina  M.D.   On: 10/14/2014 18:23    Review of Systems  Constitutional: Negative for fever and diaphoresis.  HENT: Negative for congestion.   Respiratory: Negative for shortness of breath.   Cardiovascular: Negative for chest pain, orthopnea, leg swelling and PND.  Gastrointestinal: Negative for nausea, vomiting, abdominal pain, blood in stool and melena.  Genitourinary: Positive for hematuria.  Musculoskeletal: Negative for myalgias.  Neurological: Negative for dizziness.  All other systems reviewed and are negative.  Blood pressure 117/48, pulse 107, temperature 97.6 F (36.4 C), temperature source Oral, resp. rate 17, height _0  (1.803 m), weight 262 lb 2 oz (118.9 kg), SpO2 97 %. Physical Exam  Nursing note and vitals reviewed. Constitutional: He is oriented to person, place, and time. He appears well-developed. No distress.  Obese  HENT:  Head: Normocephalic and atraumatic.  Eyes: EOM are normal. Pupils are equal, round, and reactive to light. No scleral icterus.  Neck: Normal range of motion. Neck supple. No JVD present.  Cardiovascular: Normal rate, S1 normal and S2 normal.  An irregularly irregular rhythm present.  Murmur heard.  Systolic murmur is present with a grade of 1/6  Pulses:      Radial pulses are 2+ on the right side, and 2+ on the left side.  No Carotid bruits  Respiratory: Effort normal and breath sounds normal. No respiratory distress. He has no wheezes.  GI: Soft. Bowel sounds are normal. He exhibits no distension. There is no tenderness.  Musculoskeletal: He exhibits no edema.  Lymphadenopathy:  He has no cervical adenopathy.  Neurological: He is alert and oriented to person, place, and time. He exhibits normal muscle tone.  Skin: Skin is warm.  Psychiatric: He has a normal mood and affect.    Assessment/Plan: Active Problems:   Hematuria   Bladder tumor   Cellulitis   Atrial fibrillation with RVR   NISCM- EF 68%   Chronic systolic HF.     Acute  Renal injury  The patient has diuresed well in the last two weeks.  His weight has decreased 40 # based on today's weight.  He denies any CHF symptoms.  Not on diuretics now.  Watch volume.  SCr has worsened since last year. Over diuresed?  He is intermittently pacing.     HR 70-80 now.  On Amio 200BID, digoxin 0.168m, lopressor 1057mBID.  IV cardizem stopped.  No pradaxa given hematuria.  He does not complain of any ischemic symptoms.  MD to follow with risk assessment.     HATarri FullerPAC 10/15/2014, 1:19 PM

## 2014-10-15 NOTE — Progress Notes (Signed)
Inpatient Diabetes Program Recommendations  AACE/ADA: New Consensus Statement on Inpatient Glycemic Control (2013)  Target Ranges:  Prepandial:   less than 140 mg/dL      Peak postprandial:   less than 180 mg/dL (1-2 hours)      Critically ill patients:  140 - 180 mg/dL     Results for [name], [name] (MRN [id number]) as of 10/15/2014 13:58  Ref. Range 10/15/2014 07:47 10/15/2014 12:09  Glucose-Capillary Latest Range: 70-99 mg/dL 212 (H) 209 (H)     Chief Complaint: Hematuria/ Bladder Tumor  History: DM, A Fib, HTN, Extensive cardiac history  Home DM Meds: Levemir 100 units bid         Novolog 30 units tidwc         Amaryl 2 mg bid         Januvia 100 mg daily         Metformin 1000 mg bid         Invokana 300 mg daily  Current DM Orders: Novolog Resistant SSI tid ac + HS     MD- Note patient takes extremely large doses of insulin at home in addition to 4 oral DM medications  Glucose levels 200's today  May want to consider starting a very small portion of patient's home Levemir dose- Could start with 20% of home dose and titrate upward as needed- Levemir 20 units bid    Will follow Wyn Quaker RN, MSN, CDE Diabetes Coordinator Inpatient Diabetes Program Team Pager: [phone] (8a-10p)

## 2014-10-15 NOTE — Care Management Note (Signed)
CARE MANAGEMENT NOTE 10/15/2014  Patient:  [name], [name]   Account Number:  [id number]  Date Initiated:  10/15/2014  Documentation initiated by:  Sherika Kubicki  Subjective/Objective Assessment:   possible bladder mass and right hydronephrosis.  From [facility] was found to be A. Fib and started on iv cardizem     Action/Plan:   home when stable. pt lives by himslef in [place], [place]. has signif. cardiac hx with EF 35%, diffuse hypokinesis/Bi-ventricular implantable cardioverter defibrillator  (crt-d)   Anticipated DC Date:  10/18/2014   Anticipated DC Plan:  HOME/SELF CARE  In-house referral  NA      DC Planning Services  CM consult      PAC Choice  NA   Choice offered to / List presented to:  NA   DME arranged  NA      DME agency  NA     [place] arranged  NA      [place] agency  NA   Status of service:  In process, will continue to follow Medicare Important Message given?   (If response is "NO", the following Medicare IM given date fields will be blank) Date Medicare IM given:   Medicare IM given by:   Date Additional Medicare IM given:   Additional Medicare IM given by:    Discharge Disposition:    Per UR Regulation:  Reviewed for med. necessity/level of care/duration of stay  If discussed at [place] of Stay Meetings, dates discussed:    Comments:  Feb. 29 2016/Bahja Bence L. Rosana Hoes, RN, BSN, CCM. Case Management [place] [phone] No discharge needs present of time of review.

## 2014-10-16 ENCOUNTER — Encounter (HOSPITAL_COMMUNITY): Payer: Self-pay | Admitting: Anesthesiology

## 2014-10-16 ENCOUNTER — Inpatient Hospital Stay (HOSPITAL_COMMUNITY): Payer: Medicare Other | Admitting: Anesthesiology

## 2014-10-16 ENCOUNTER — Encounter (HOSPITAL_COMMUNITY)
Admission: AD | Disposition: A | Discharge status: Home / Self Care | Payer: Self-pay | Source: Other Acute Inpatient Hospital | Attending: Pulmonary Disease

## 2014-10-16 DIAGNOSIS — I428 Other cardiomyopathies: Secondary | ICD-10-CM | POA: Insufficient documentation

## 2014-10-16 DIAGNOSIS — I429 Cardiomyopathy, unspecified: Secondary | ICD-10-CM

## 2014-10-16 HISTORY — PX: TRANSURETHRAL RESECTION OF BLADDER TUMOR WITH GYRUS (TURBT-GYRUS): SHX6458

## 2014-10-16 HISTORY — PX: CYSTOSCOPY/RETROGRADE/URETEROSCOPY: SHX5316

## 2014-10-16 LAB — CBC
HEMATOCRIT: 33.2 % — AB (ref 39.0–52.0)
Hemoglobin: 10.2 g/dL — ABNORMAL LOW (ref 13.0–17.0)
MCH: 26.8 pg (ref 26.0–34.0)
MCHC: 30.7 g/dL (ref 30.0–36.0)
MCV: 87.4 fL (ref 78.0–100.0)
Platelets: 318 10*3/uL (ref 150–400)
RBC: 3.8 MIL/uL — ABNORMAL LOW (ref 4.22–5.81)
RDW: 15.8 % — ABNORMAL HIGH (ref 11.5–15.5)
WBC: 13.5 10*3/uL — AB (ref 4.0–10.5)

## 2014-10-16 LAB — CLOSTRIDIUM DIFFICILE BY PCR: CDIFFPCR: NEGATIVE

## 2014-10-16 LAB — GLUCOSE, CAPILLARY
GLUCOSE-CAPILLARY: 151 mg/dL — AB (ref 70–99)
Glucose-Capillary: 176 mg/dL — ABNORMAL HIGH (ref 70–99)
Glucose-Capillary: 193 mg/dL — ABNORMAL HIGH (ref 70–99)
Glucose-Capillary: 160 mg/dL — ABNORMAL HIGH (ref 70–99)
Glucose-Capillary: 147 mg/dL — ABNORMAL HIGH (ref 70–99)
Glucose-Capillary: 247 mg/dL — ABNORMAL HIGH (ref 70–99)
Glucose-Capillary: 192 mg/dL — ABNORMAL HIGH (ref 70–99)

## 2014-10-16 LAB — BASIC METABOLIC PANEL
Anion gap: 7 (ref 5–15)
BUN: 51 mg/dL — AB (ref 6–23)
CALCIUM: 8.1 mg/dL — AB (ref 8.4–10.5)
CHLORIDE: 109 mmol/L (ref 96–112)
CO2: 23 mmol/L (ref 19–32)
CREATININE: 1.34 mg/dL (ref 0.50–1.35)
GFR calc Af Amer: 67 mL/min — ABNORMAL LOW (ref >90)
GFR calc non Af Amer: 58 mL/min — ABNORMAL LOW (ref >90)
GLUCOSE: 202 mg/dL — AB (ref 70–99)
Potassium: 4.5 mmol/L (ref 3.5–5.1)
Sodium: 139 mmol/L (ref 135–145)

## 2014-10-16 LAB — PHOSPHORUS: PHOSPHORUS: 3.1 mg/dL (ref 2.3–4.6)

## 2014-10-16 LAB — MAGNESIUM: MAGNESIUM: 2.8 mg/dL — AB (ref 1.5–2.5)

## 2014-10-16 SURGERY — CYSTOSCOPY/RETROGRADE/URETEROSCOPY
Anesthesia: General | Site: Bladder

## 2014-10-16 MED ORDER — LIDOCAINE HCL 2 % EX GEL
CUTANEOUS | Status: AC
  Filled 2014-10-16: qty 10

## 2014-10-16 MED ORDER — PROPOFOL 10 MG/ML IV BOLUS
INTRAVENOUS | Status: DC | PRN
  Administered 2014-10-16: 170 mg via INTRAVENOUS

## 2014-10-16 MED ORDER — PNEUMOCOCCAL VAC POLYVALENT 25 MCG/0.5ML IJ INJ
0.5000 mL | INJECTION | INTRAMUSCULAR | Status: AC
  Administered 2014-10-18: 0.5 mL via INTRAMUSCULAR
  Filled 2014-10-16 (×4): qty 0.5

## 2014-10-16 MED ORDER — LACTATED RINGERS IV SOLN
INTRAVENOUS | Status: DC | PRN
  Administered 2014-10-16: 14:00:00 via INTRAVENOUS

## 2014-10-16 MED ORDER — LACTATED RINGERS IV SOLN
INTRAVENOUS | Status: DC
  Administered 2014-10-16: 1000 mL via INTRAVENOUS

## 2014-10-16 MED ORDER — ONDANSETRON HCL 4 MG/2ML IJ SOLN
INTRAMUSCULAR | Status: AC
  Filled 2014-10-16: qty 2

## 2014-10-16 MED ORDER — LACTATED RINGERS IV SOLN: INTRAVENOUS | Status: DC

## 2014-10-16 MED ORDER — PHENYLEPHRINE HCL 10 MG/ML IJ SOLN
INTRAMUSCULAR | Status: DC | PRN
  Administered 2014-10-16 (×3): 80 ug via INTRAVENOUS
  Administered 2014-10-16: 40 ug via INTRAVENOUS

## 2014-10-16 MED ORDER — PHENYLEPHRINE 40 MCG/ML (10ML) SYRINGE FOR IV PUSH (FOR BLOOD PRESSURE SUPPORT)
PREFILLED_SYRINGE | INTRAVENOUS | Status: AC
  Filled 2014-10-16: qty 10

## 2014-10-16 MED ORDER — LIDOCAINE HCL 1 % IJ SOLN
INTRAMUSCULAR | Status: DC | PRN
  Administered 2014-10-16: 50 mg via INTRADERMAL

## 2014-10-16 MED ORDER — INSULIN ASPART 100 UNIT/ML ~~LOC~~ SOLN
0.0000 [IU] | SUBCUTANEOUS | Status: DC
  Administered 2014-10-16: 3 [IU] via SUBCUTANEOUS
  Administered 2014-10-16: 8 [IU] via SUBCUTANEOUS
  Administered 2014-10-17 (×3): 3 [IU] via SUBCUTANEOUS
  Administered 2014-10-17: 5 [IU] via SUBCUTANEOUS
  Administered 2014-10-17: 3 [IU] via SUBCUTANEOUS
  Administered 2014-10-18 (×2): 8 [IU] via SUBCUTANEOUS
  Administered 2014-10-18: 3 [IU] via SUBCUTANEOUS

## 2014-10-16 MED ORDER — CEFAZOLIN SODIUM-DEXTROSE 2-3 GM-% IV SOLR
2.0000 g | Freq: Three times a day (TID) | INTRAVENOUS | Status: DC
  Administered 2014-10-16 – 2014-10-18 (×6): 2 g via INTRAVENOUS
  Filled 2014-10-16 (×12): qty 50

## 2014-10-16 MED ORDER — SODIUM CHLORIDE 0.9 % IR SOLN
Status: DC | PRN
  Administered 2014-10-16: 18000 mL

## 2014-10-16 MED ORDER — FENTANYL CITRATE 0.05 MG/ML IJ SOLN
INTRAMUSCULAR | Status: AC
  Filled 2014-10-16: qty 5

## 2014-10-16 MED ORDER — PROPOFOL 10 MG/ML IV BOLUS
INTRAVENOUS | Status: AC
  Filled 2014-10-16: qty 20

## 2014-10-16 MED ORDER — LIDOCAINE HCL (CARDIAC) 20 MG/ML IV SOLN
INTRAVENOUS | Status: AC
  Filled 2014-10-16: qty 5

## 2014-10-16 MED ORDER — MIDAZOLAM HCL 2 MG/2ML IJ SOLN
INTRAMUSCULAR | Status: AC
  Filled 2014-10-16: qty 2

## 2014-10-16 MED ORDER — MIDAZOLAM HCL 5 MG/5ML IJ SOLN
INTRAMUSCULAR | Status: DC | PRN
Start: 2014-10-16 — End: 2014-10-16
  Administered 2014-10-16 (×2): 1 mg via INTRAVENOUS

## 2014-10-16 MED ORDER — FENTANYL CITRATE 0.05 MG/ML IJ SOLN: 25.0000 ug | INTRAMUSCULAR | Status: DC | PRN

## 2014-10-16 MED ORDER — FENTANYL CITRATE 0.05 MG/ML IJ SOLN
INTRAMUSCULAR | Status: DC | PRN
  Administered 2014-10-16: 50 ug via INTRAVENOUS
  Administered 2014-10-16 (×2): 25 ug via INTRAVENOUS

## 2014-10-16 SURGICAL SUPPLY — 19 items
BAG URO CATCHER STRL LF (DRAPE) ×4 IMPLANT
BASKET LASER NITINOL 1.9FR (BASKET) ×1 IMPLANT
BASKET ZERO TIP NITINOL 2.4FR (BASKET) IMPLANT
BSKT STON RTRVL 120 1.9FR (BASKET)
BSKT STON RTRVL ZERO TP 2.4FR (BASKET)
CATH FOLEY 2WAY SLVR  5CC 22FR (CATHETERS) ×2
CATH FOLEY 2WAY SLVR 5CC 22FR (CATHETERS) ×1 IMPLANT
CATH URET 5FR 28IN OPEN ENDED (CATHETERS) ×2 IMPLANT
GLOVE SURG SS PI 8.0 STRL IVOR (GLOVE) ×2 IMPLANT
GOWN STRL REUS W/TWL XL LVL3 (GOWN DISPOSABLE) ×4 IMPLANT
GUIDEWIRE ANG ZIPWIRE 038X150 (WIRE) IMPLANT
GUIDEWIRE STR DUAL SENSOR (WIRE) ×2 IMPLANT
KIT ASPIRATION TUBING (SET/KITS/TRAYS/PACK) ×2 IMPLANT
MANIFOLD NEPTUNE II (INSTRUMENTS) ×4 IMPLANT
PACK CYSTO (CUSTOM PROCEDURE TRAY) ×4 IMPLANT
SYRINGE 12CC LL (MISCELLANEOUS) ×2 IMPLANT
TUBING CONNECTING 10 (TUBING) ×3 IMPLANT
TUBING CONNECTING 10' (TUBING) ×1
WIRE COONS/BENSON .038X145CM (WIRE) IMPLANT

## 2014-10-16 NOTE — Anesthesia Preprocedure Evaluation (Addendum)
Anesthesia Evaluation  Patient identified by MRN, date of birth, ID band Patient awake    Reviewed: Allergy & Precautions, H&P , NPO status , Patient's Chart, lab work & pertinent test results  Airway Mallampati: III  TM Distance: >3 FB Neck ROM: full    Dental  (+) Edentulous Upper, Poor Dentition, Dental Advisory Given   Pulmonary sleep apnea and Continuous Positive Airway Pressure Ventilation ,  breath sounds clear to auscultation  Pulmonary exam normal       Cardiovascular Exercise Tolerance: Poor hypertension, Pt. on medications + dysrhythmias Atrial Fibrillation + Cardiac Defibrillator Rhythm:Irregular Rate:Normal  Bifascicular block.  EF 20 - 25% echo 5/13. Non-ischemic cardiomyopathy.  Pulmonary hypertension. Incomplete LBBB.  ECG 2/28  AF with RVR   Neuro/Psych negative neurological ROS  negative psych ROS   GI/Hepatic negative GI ROS, Neg liver ROS,   Endo/Other  diabetes, Well Controlled, Type 2, Oral Hypoglycemic Agents, Insulin Dependent  Renal/GU Renal InsufficiencyRenal disease  negative genitourinary   Musculoskeletal   Abdominal   Peds  Hematology negative hematology ROS (+) anemia , hgb 10.2   Anesthesia Other Findings   Reproductive/Obstetrics negative OB ROS                           Anesthesia Physical Anesthesia Plan  ASA: IV  Anesthesia Plan: General   Post-op Pain Management:    Induction: Intravenous  Airway Management Planned: LMA  Additional Equipment:   Intra-op Plan:   Post-operative Plan:   Informed Consent: I have reviewed the patients History and Physical, chart, labs and discussed the procedure including the risks, benefits and alternatives for the proposed anesthesia with the patient or authorized representative who has indicated his/her understanding and acceptance.   Dental Advisory Given  Plan Discussed with: CRNA and Surgeon  Anesthesia  Plan Comments:         Anesthesia Quick Evaluation

## 2014-10-16 NOTE — Progress Notes (Signed)
PROGRESS NOTE  [name] EHU:[id number] DOB: 1961/07/29 DOA: 10/14/2014 PCP: [name], MD  HPI: 54 yo male on pradaxa as outpt for a fib presented to [place] with painless hematuria. He described this as frank blood. He was seen by urology (Dr. Exie Parody) at [facility] and started on bladder irrigation. Cystoscopy showed posterior wall bladder tumor. It was recommended that he be transferred to [facility] to have further cardiac assessment prior to having additional urology intervention. He had urine culture which showed Staph aureus >> MSSA. - While at [facility] he developed RVR from his A fib and started on cardizem gtt. - He has wounds on both his feet >> Rt worse than Lt. These have been present for at least 1 month. He never sought attention for these. - he has a past medical history of Hypertension; Morbid obesity; Atrial flutter ( 10/01/2006, 12/30/11); Type II or unspecified type diabetes mellitus without mention of complication, not stated as uncontrolled; OSA (obstructive sleep apnea); Nonischemic cardiomyopathy; Chronic systolic dysfunction of left ventricle; Unspecified disorder of kidney and ureter; RBBB, anterior fascicular block and incomplete LBBB; Pulmonary hypertension; and Valvular heart disease. - has past surgical history that includes Bi-ventricular implantable cardioverter defibrillator (crt-d) (10/27/2013); Atrial flutter ablation (N/A, 03/04/2012); and bi-ventricular implantable cardioverter defibrillator (N/A, 10/27/2013).  SIGNIFICANT EVENTS  2/25 Admit to [facility] 2/28 Transfer to [facility]  STUDIES:  04/24/14 Echo >> EF 35%, diffuse hypokinesis 10/11/14 CT abd/pelvis from [facility] >> 3 mm LLL nodule, mild/mod Rt hydronephrosis, distended bladder, mild perinephric stranding b/l  Subjective / 24 H Interval events - feeling well this morning, denies chest pain/shortness of breath   Assessment/Plan: Active Problems:   LBBB (left bundle branch block)  Hematuria   Bladder tumor   Cellulitis   Atrial fibrillation with RVR  Hematuria with bladder tumor noted at [facility]. - Urology was consulted, plan for cystoscopy with retrograde pyelography likely today - Appreciate input   Stage 2 CKD - Creatinine stable, continue to monitor perioperatively  Chronic systolic heart failure  - cardiology consulted preop, appreciate input - Most recent 2-D echo was done in September 2015 and showed an ejection fraction of 35%   - currently he is euvolemic - Continue Avapro, beta blocker  A. fib with RVR - Continue amiodarone, digoxin, metoprolol - Pradaxa now on hold due to planned urologic intervention  Hyperlipidemia  - continue Lopid and Lipitor  Hypertension  - continue clonidine and other medications as above  B/L foot wounds. - Dr. Sharol Given consulted and evaluated patient while hospitalized - Patient underwent CT scan of the right foot which showed changes consistent with chronic gout, and will follow-up as an outpatient Dr. Sharol Given  UTI with MSSA on urine cx from [facility] 2/25. - Continue zosyn - f/u blood cultures [place] from 2/25  DM type II with renal complications. - SSI - hold outpt oral agents for now  45mm nodule Lt lower lung on CT abd/pelvis 10/11/14.     Diet: Diet NPO time specified Fluids: none DVT Prophylaxis: SCD  Code Status: Full Code Family Communication: d.w patient  Disposition Plan: remain in SDU  Consultants:  Urology  Cardiology  PCCM  Procedures:  None    Antibiotics  Anti-infectives    Start     Dose/Rate Route Frequency Ordered Stop   10/14/14 1900  piperacillin-tazobactam (ZOSYN) IVPB 3.375 g     3.375 g 12.5 mL/hr over 240 Minutes Intravenous Every 8 hours 10/14/14 1829         Studies  Dg Chest Port 1 View  10/15/2014   CLINICAL DATA:  Atrial flutter; hypertension  EXAM: PORTABLE CHEST - 1 VIEW  COMPARISON:  October 28, 2013  FINDINGS: Pacemaker leads are attached to the right  atrium, right ventricle, and left ventricle, stable. No pneumothorax. There is cardiomegaly with mild pulmonary venous hypertension. No edema or consolidation. No effusions. No adenopathy.  IMPRESSION: Suspect a degree of chronic volume overload. Pacemaker leads unchanged. No pneumothorax.   Electronically Signed   By: Lowella Grip III M.D.   On: 10/15/2014 07:26   Dg Foot Complete Right  10/14/2014   CLINICAL DATA:  Lateral foot wound for 1 month  EXAM: RIGHT FOOT COMPLETE - 3+ VIEW  COMPARISON:  None.  FINDINGS: No acute fracture or dislocation is noted. Increased cortical thickening is noted within the fifth metatarsal as well as areas of lucency in the bases of the second through fourth metatarsals. The known lateral wound is seen. These changes are highly suspicious for underlying osteomyelitis. Calcaneal spurs are seen.  IMPRESSION: Soft tissue wound with associated bony changes suspicious for osteomyelitis. MRI may be helpful for further evaluation.   Electronically Signed   By: Inez Catalina M.D.   On: 10/14/2014 18:23    Objective  Filed Vitals:   10/16/14 0000 10/16/14 0100 10/16/14 0400 10/16/14 0500  BP:  96/62  139/71  Pulse:  83  80  Temp: 98.9 F (37.2 C)  97.5 F (36.4 C)   TempSrc: Oral  Oral   Resp:  20  17  Height:      Weight:      SpO2:  98%  99%    Intake/Output Summary (Last 24 hours) at 10/16/14 0657 Last data filed at 10/16/14 0413  Gross per 24 hour  Intake   7075 ml  Output  16520 ml  Net  -9445 ml   Filed Weights   10/14/14 1710 10/15/14 0600  Weight: 117.9 kg (259 lb 14.8 oz) 118.9 kg (262 lb 2 oz)    Exam:  General:  NAD  HEENT: no scleral icterus  Cardiovascular: RRR  Respiratory: CTA biL  Abdomen: soft, non tender  MSK/Extremities: wrapped bilateral LE  Skin: no rashes  Neuro: non focal   Data Reviewed: Basic Metabolic Panel:  Recent Labs Lab 10/15/14 0413 10/16/14 0345  NA 141 139  K 4.8 4.5  CL 112 109  CO2 21 23    GLUCOSE 237* 202*  BUN 62* 51*  CREATININE 1.50* 1.34  CALCIUM 8.1* 8.1*  MG 3.0* 2.8*  PHOS 3.3 3.1   Liver Function Tests:  Recent Labs Lab 10/15/14 0413  AST 22  ALT 22  ALKPHOS 166*  BILITOT 1.0  PROT 6.3  ALBUMIN 2.4*   CBC:  Recent Labs Lab 10/15/14 0413 10/16/14 0345  WBC 12.2* 13.5*  HGB 10.2* 10.2*  HCT 32.8* 33.2*  MCV 86.3 87.4  PLT 294 318   BNP (last 3 results)  Recent Labs  10/15/14 1608  BNP 751.7*   CBG:  Recent Labs Lab 10/14/14 2205 10/15/14 0747 10/15/14 1209 10/15/14 1616 10/15/14 2150  GLUCAP 187* 212* 209* 195* 281*    Recent Results (from the past 240 hour(s))  MRSA PCR Screening     Status: None   Collection Time: 10/14/14  5:56 PM  Result Value Ref Range Status   MRSA by PCR NEGATIVE NEGATIVE Final    Comment:        The GeneXpert MRSA Assay (FDA approved for NASAL specimens  only), is one component of a comprehensive MRSA colonization surveillance program. It is not intended to diagnose MRSA infection nor to guide or monitor treatment for MRSA infections.      Scheduled Meds: . allopurinol  100 mg Oral Daily  . amiodarone  200 mg Oral BID  . antiseptic oral rinse  7 mL Mouth Rinse q12n4p  . atorvastatin  10 mg Oral q1800  . chlorhexidine  15 mL Mouth Rinse BID  . cloNIDine  0.2 mg Oral BID  . collagenase   Topical Daily  . digoxin  0.125 mg Oral Daily  . gemfibrozil  600 mg Oral BID AC  . insulin aspart  0-20 Units Subcutaneous TID WC  . insulin aspart  0-5 Units Subcutaneous QHS  . irbesartan  150 mg Oral Daily  . metoprolol tartrate  100 mg Oral BID  . piperacillin-tazobactam (ZOSYN)  IV  3.375 g Intravenous Q8H  . prazosin  1 mg Oral BID   Continuous Infusions:   Marzetta Board, MD [facility] Pager [phone]. If 7 PM - 7 AM, please contact night-coverage at [email or web address], password [facility] 10/16/2014, 6:57 AM  LOS: 2 days

## 2014-10-16 NOTE — Progress Notes (Signed)
SUBJECTIVE:  Feels better.  No orthopnea or PND.  OBJECTIVE:   Vitals:   Filed Vitals:   10/16/14 1615 10/16/14 1630 10/16/14 1700 10/16/14 1706  BP: 106/79 128/76 135/91   Pulse: 82 83 86   Temp: 97.9 F (36.6 C) 97.9 F (36.6 C)  97.4 F (36.3 C)  TempSrc:    Oral  Resp: 13 11 14    Height:      Weight:      SpO2: 98% 98% 100%    I&O's:   Intake/Output Summary (Last 24 hours) at 10/16/14 1755 Last data filed at 10/16/14 1615  Gross per 24 hour  Intake    900 ml  Output   2620 ml  Net  -1720 ml   TELEMETRY: Reviewed telemetry pt in NSR:     PHYSICAL EXAM General: Well developed, well nourished, in no acute distress Head:   Normal cephalic and atramatic  Lungs:  No wheezing. Heart:   HRRR S1 S2  No JVD.   Abdomen: abdomen soft and non-tender Msk:  Back normal,  Normal strength and tone for age. Extremities: both legs wrapped  Neuro: Alert  Psych:  Normal affect, responds appropriately    LABS: Basic Metabolic Panel:  Recent Labs  10/15/14 0413 10/16/14 0345  NA 141 139  K 4.8 4.5  CL 112 109  CO2 21 23  GLUCOSE 237* 202*  BUN 62* 51*  CREATININE 1.50* 1.34  CALCIUM 8.1* 8.1*  MG 3.0* 2.8*  PHOS 3.3 3.1   Liver Function Tests:  Recent Labs  10/15/14 0413  AST 22  ALT 22  ALKPHOS 166*  BILITOT 1.0  PROT 6.3  ALBUMIN 2.4*   No results for input(s): LIPASE, AMYLASE in the last 72 hours. CBC:  Recent Labs  10/15/14 0413 10/16/14 0345  WBC 12.2* 13.5*  HGB 10.2* 10.2*  HCT 32.8* 33.2*  MCV 86.3 87.4  PLT 294 318   Cardiac Enzymes: No results for input(s): CKTOTAL, CKMB, CKMBINDEX, TROPONINI in the last 72 hours. BNP: Invalid input(s): POCBNP D-Dimer: No results for input(s): DDIMER in the last 72 hours. Hemoglobin A1C: No results for input(s): HGBA1C in the last 72 hours. Fasting Lipid Panel: No results for input(s): CHOL, HDL, LDLCALC, TRIG, CHOLHDL, LDLDIRECT in the last 72 hours. Thyroid Function Tests: No results for  input(s): TSH, T4TOTAL, T3FREE, THYROIDAB in the last 72 hours.  Invalid input(s): FREET3 Anemia Panel: No results for input(s): VITAMINB12, FOLATE, FERRITIN, TIBC, IRON, RETICCTPCT in the last 72 hours. Coag Panel:   Lab Results  Component Value Date   INR 1.51* 10/15/2014   INR 1.15 10/27/2013   INR 1.33 03/04/2012    RADIOLOGY: Ct Foot Right Wo Contrast  10/16/2014   CLINICAL DATA:  Chronic osteomyelitis of the right foot. Lytic lesion along the second metatarsal.  EXAM: CT OF THE RIGHT FOOT WITHOUT CONTRAST  TECHNIQUE: Multidetector CT imaging of the right foot was performed according to the standard protocol. Multiplanar CT image reconstructions were also generated.  COMPARISON:  10/14/2014  FINDINGS: Erosions, some with overhanging edges and cortical irregularity, are observed along the distal fibula; distal tibia; medial talus proximally; the CT if the calcaneocuboid articulation; the distal navicular; along much of the Lisfranc joint; and along the first metatarsophalangeal joint. Some of these erosions are along the intermetatarsal bursal region and not directly along the joint itself, including the larger erosion along the base of the second metatarsal which measures proximally 1.6 by 1.0 cm on image 21 of  series 605. I do not see a bony sequestrum within this erosion although there is some lucency in the marginal bone as shown on image 22 series 605. There are some spiculations bone in some of the other erosions, such as in the medial malleolar erosion on image 30 of series 605.  There is diffuse subcutaneous and cutaneous edema along the ankle and foot, most severely laterally were there is skin irregularity, wrinkling, and quite possibly ulceration -correlation with physical inspection suggested. Small bony fragments are present below the medial and lateral malleolus could. Highly attenuated distal tibialis posterior tendon, distal to the accessory navicular. Atrophic plantar musculature in  the foot. Small amount of gas in the subcutaneous tissues along the dorsal lateral foot, image 66 series 606.  Plantar and Achilles calcaneal spurs are somewhat fragmented and irregular.  IMPRESSION: 1. Suspected cellulitis and probable lateral ulceration along the foot, with a tiny amount of gas in the subcutaneous tissues. 2. Large marginal erosions with overhanging edges throughout much of the hindfoot and midfoot and also at the first metatarsophalangeal joint. The largest erosive lesion is at the second metatarsal base. Given the numerous areas involvement, gout is considered of fairly high probability. Chronic multifocal osteomyelitis seems less likely. That said, there is likely soft tissue infection laterally. MRI can sometimes be useful in differentiating gout from osteomyelitis based on the degree of associated edema, but I do note that the patient has a pacemaker. Needle aspiration of the second toe cavity may be warranted. It might also be helpful to image the contralateral foot to look for signs of gout.   Electronically Signed   By: Van Clines M.D.   On: 10/16/2014 08:34   Dg Chest Port 1 View  10/15/2014   CLINICAL DATA:  Atrial flutter; hypertension  EXAM: PORTABLE CHEST - 1 VIEW  COMPARISON:  October 28, 2013  FINDINGS: Pacemaker leads are attached to the right atrium, right ventricle, and left ventricle, stable. No pneumothorax. There is cardiomegaly with mild pulmonary venous hypertension. No edema or consolidation. No effusions. No adenopathy.  IMPRESSION: Suspect a degree of chronic volume overload. Pacemaker leads unchanged. No pneumothorax.   Electronically Signed   By: Lowella Grip III M.D.   On: 10/15/2014 07:26   Dg Foot Complete Right  10/14/2014   CLINICAL DATA:  Lateral foot wound for 1 month  EXAM: RIGHT FOOT COMPLETE - 3+ VIEW  COMPARISON:  None.  FINDINGS: No acute fracture or dislocation is noted. Increased cortical thickening is noted within the fifth metatarsal as  well as areas of lucency in the bases of the second through fourth metatarsals. The known lateral wound is seen. These changes are highly suspicious for underlying osteomyelitis. Calcaneal spurs are seen.  IMPRESSION: Soft tissue wound with associated bony changes suspicious for osteomyelitis. MRI may be helpful for further evaluation.   Electronically Signed   By: Inez Catalina M.D.   On: 10/14/2014 18:23      ASSESSMENT: Kathyrn Lass:    Nonischemic cardiomyopathy: Compensated. No evidence of volume overload at this time.   AFib: Holding Pradaxa due to hematuria.    He has been cleared for his urologic procedure by Dr. Tamala Julian on 10/15/14.  Jettie Booze, MD  10/16/2014  5:55 PM

## 2014-10-16 NOTE — Anesthesia Postprocedure Evaluation (Signed)
  Anesthesia Post-op Note  Patient: [name]  Procedure(s) Performed: Procedure(s) (LRB): CYSTO WITH BILATERAL /RETROGRADE,TURBT WITH GYRUS (Bilateral) TRANSURETHRAL RESECTION OF BLADDER TUMOR WITH GYRUS (TURBT-GYRUS) (N/A)  Patient Location: PACU  Anesthesia Type: General  Level of Consciousness: awake and alert   Airway and Oxygen Therapy: Patient Spontanous Breathing  Post-op Pain: mild  Post-op Assessment: Post-op Vital signs reviewed, Patient's Cardiovascular Status Stable, Respiratory Function Stable, Patent Airway and No signs of Nausea or vomiting  Last Vitals:  Filed Vitals:   10/16/14 1706  BP:   Pulse:   Temp: 36.3 C  Resp:     Post-op Vital Signs: stable   Complications: No apparent anesthesia complications

## 2014-10-16 NOTE — Progress Notes (Signed)
[name] is voiding well and reports no further hematuria or pain.    We still need to do cystoscopy and retrogrades.

## 2014-10-16 NOTE — Progress Notes (Signed)
Patient ID: [name], male   DOB: 10/18/1960, 54 y.o.   MRN: [id number] Review of the patient's CT scan shows periarticular cystic changes of the base of the metatarsals most likely consistent with chronic gout. This does not appear to be a infectious process and does not appear to be involved with the patient's plantar ulcer. Recommend continued wound care and I will follow-up in the office in 2 weeks.

## 2014-10-16 NOTE — Progress Notes (Signed)
Report to Sophia RN patient transported to Room 1425, patient stable and denies needs at this time.

## 2014-10-16 NOTE — Transfer of Care (Signed)
Immediate Anesthesia Transfer of Care Note  Patient: [name]  Procedure(s) Performed: Procedure(s): CYSTO WITH BILATERAL /RETROGRADE,POSSIBLE BIOPSY,POSSIBLE TURBT WITH GYRUS (Bilateral) POSSIBLE  BIOPSY (N/A) TRANSURETHRAL RESECTION OF BLADDER TUMOR WITH GYRUS (TURBT-GYRUS) (N/A)  Patient Location: PACU  Anesthesia Type:General  Level of Consciousness: awake, alert  and oriented  Airway & Oxygen Therapy: Patient Spontanous Breathing and Patient connected to face mask oxygen  Post-op Assessment: Report given to RN and Post -op Vital signs reviewed and stable  Post vital signs: Reviewed and stable  Last Vitals:  Filed Vitals:   10/16/14 1320  BP:   Pulse:   Temp: 36.4 C  Resp:     Complications: No apparent anesthesia complications

## 2014-10-16 NOTE — Brief Op Note (Signed)
10/14/2014 - 10/16/2014  3:33 PM  PATIENT:  [name]  54 y.o. male  PRE-OPERATIVE DIAGNOSIS:  HEMATURIA  RIGHT HYDRONEPHROSIS  POST-OPERATIVE DIAGNOSIS:  HEMATURIA  RIGHT HYDRONEPHROSIS LARGE BLADDER TUMOR PROCEDURE:  Procedure(s): CYSTO WITH BILATERAL /RETROGRADE,POSSIBLE BIOPSY,POSSIBLE TURBT WITH GYRUS (Bilateral) POSSIBLE  BIOPSY (N/A) TRANSURETHRAL RESECTION OF BLADDER TUMOR WITH GYRUS (TURBT-GYRUS) (N/A) >5cm  SURGEON:  Surgeon(s) and Role:    * Malka So, MD - Primary  PHYSICIAN ASSISTANT:   ASSISTANTS: none   ANESTHESIA:   general  EBL:  Total I/O In: -  Out: 400 [Urine:400]  BLOOD ADMINISTERED:none  DRAINS: Urinary Catheter (Foley)   LOCAL MEDICATIONS USED:  NONE  SPECIMEN:  Source of Specimen:  bladder tumor and bladder tumor base  DISPOSITION OF SPECIMEN:  PATHOLOGY  COUNTS:  YES  TOURNIQUET:  * No tourniquets in log *  DICTATION: .Other Dictation: Dictation Number [id number]  PLAN OF CARE: Admit to inpatient   PATIENT DISPOSITION:  PACU - hemodynamically stable.   Delay start of Pharmacological VTE agent (>24hrs) due to surgical blood loss or risk of bleeding: yes

## 2014-10-16 NOTE — Progress Notes (Signed)
3/1: Culture results from [facility] hospital:  2/25 blood: NGTD  2/25 urine: 15k MSSA  R PCN  S Ciprofloxacin, Levofloxacin  S Rifampin  S SMX-TMP  S Tetracycline   S Nitrofurantoin  S Cefazolin  S Amp/Sulbactam  S Gentamicin  S Vancomycin  S Oxacillin  S Levofloxacin  Reuel Boom, PharmD Pager: [phone] 10/16/2014, 3:24 PM

## 2014-10-17 ENCOUNTER — Encounter (HOSPITAL_COMMUNITY): Payer: Self-pay | Admitting: Radiology

## 2014-10-17 ENCOUNTER — Inpatient Hospital Stay (HOSPITAL_COMMUNITY): Payer: Medicare Other

## 2014-10-17 DIAGNOSIS — C679 Malignant neoplasm of bladder, unspecified: Secondary | ICD-10-CM

## 2014-10-17 LAB — BASIC METABOLIC PANEL
Anion gap: 5 (ref 5–15)
BUN: 37 mg/dL — ABNORMAL HIGH (ref 6–23)
CO2: 24 mmol/L (ref 19–32)
Calcium: 8 mg/dL — ABNORMAL LOW (ref 8.4–10.5)
Chloride: 109 mmol/L (ref 96–112)
Creatinine, Ser: 1.19 mg/dL (ref 0.50–1.35)
GFR, EST AFRICAN AMERICAN: 78 mL/min — AB (ref >90)
GFR, EST NON AFRICAN AMERICAN: 67 mL/min — AB (ref >90)
GLUCOSE: 159 mg/dL — AB (ref 70–99)
POTASSIUM: 4.2 mmol/L (ref 3.5–5.1)
SODIUM: 138 mmol/L (ref 135–145)

## 2014-10-17 LAB — GLUCOSE, CAPILLARY
GLUCOSE-CAPILLARY: 152 mg/dL — AB (ref 70–99)
GLUCOSE-CAPILLARY: 156 mg/dL — AB (ref 70–99)
GLUCOSE-CAPILLARY: 236 mg/dL — AB (ref 70–99)
GLUCOSE-CAPILLARY: 153 mg/dL — AB (ref 70–99)
Glucose-Capillary: 151 mg/dL — ABNORMAL HIGH (ref 70–99)
Glucose-Capillary: 158 mg/dL — ABNORMAL HIGH (ref 70–99)

## 2014-10-17 LAB — CBC WITH DIFFERENTIAL/PLATELET
BASOS PCT: 1 % (ref 0–1)
Basophils Absolute: 0.1 10*3/uL (ref 0.0–0.1)
EOS ABS: 0.3 10*3/uL (ref 0.0–0.7)
EOS PCT: 2 % (ref 0–5)
HCT: 32.9 % — ABNORMAL LOW (ref 39.0–52.0)
HEMOGLOBIN: 10.2 g/dL — AB (ref 13.0–17.0)
Lymphocytes Relative: 10 % — ABNORMAL LOW (ref 12–46)
Lymphs Abs: 1.2 10*3/uL (ref 0.7–4.0)
MCH: 26.7 pg (ref 26.0–34.0)
MCHC: 31 g/dL (ref 30.0–36.0)
MCV: 86.1 fL (ref 78.0–100.0)
MONO ABS: 0.7 10*3/uL (ref 0.1–1.0)
MONOS PCT: 6 % (ref 3–12)
Neutro Abs: 9.7 10*3/uL — ABNORMAL HIGH (ref 1.7–7.7)
Neutrophils Relative %: 81 % — ABNORMAL HIGH (ref 43–77)
Platelets: 294 10*3/uL (ref 150–400)
RBC: 3.82 MIL/uL — ABNORMAL LOW (ref 4.22–5.81)
RDW: 15.7 % — ABNORMAL HIGH (ref 11.5–15.5)
WBC: 12 10*3/uL — ABNORMAL HIGH (ref 4.0–10.5)

## 2014-10-17 MED ORDER — SODIUM CHLORIDE 0.9 % IV SOLN
Freq: Once | INTRAVENOUS | Status: AC
  Administered 2014-10-17: 11:00:00 via INTRAVENOUS

## 2014-10-17 MED ORDER — IOHEXOL 300 MG/ML  SOLN
25.0000 mL | INTRAMUSCULAR | Status: AC
  Administered 2014-10-17 (×2): 25 mL via ORAL

## 2014-10-17 MED ORDER — IOHEXOL 300 MG/ML  SOLN
125.0000 mL | Freq: Once | INTRAMUSCULAR | Status: AC | PRN
  Administered 2014-10-17: 125 mL via INTRAVENOUS

## 2014-10-17 NOTE — Progress Notes (Signed)
SUBJECTIVE:  Feels well after surgery.  No orthopnea or PND.  OBJECTIVE:   Vitals:   Filed Vitals:   10/16/14 2300 10/16/14 2334 10/17/14 0007 10/17/14 0454  BP: 117/71  122/62 126/71  Pulse: 84  71 93  Temp:  98.1 F (36.7 C) 98.4 F (36.9 C) 98.2 F (36.8 C)  TempSrc:  Oral Oral Oral  Resp: 19  19 18   Height:   5\' 11"  (1.803 m)   Weight:   253 lb 3.2 oz (114.851 kg)   SpO2: 90%  97% 96%   I&O's:    Intake/Output Summary (Last 24 hours) at 10/17/14 0746 Last data filed at 10/17/14 0456  Gross per 24 hour  Intake    850 ml  Output   3301 ml  Net  -2451 ml   TELEMETRY: Reviewed telemetry pt intermittently paced:     PHYSICAL EXAM General: Well developed, well nourished, in no acute distress Head:   Normal cephalic and atramatic  Lungs:  No wheezing. Heart:   HRRR S1 S2  No JVD.   Abdomen: abdomen soft and non-tender Msk:  Back normal,  Normal strength and tone for age. Extremities: both legs wrapped  Neuro: Alert  Psych:  Normal affect, responds appropriately    LABS: Basic Metabolic Panel:  Recent Labs  10/15/14 0413 10/16/14 0345 10/17/14 0505  NA 141 139 138  K 4.8 4.5 4.2  CL 112 109 109  CO2 21 23 24   GLUCOSE 237* 202* 159*  BUN 62* 51* 37*  CREATININE 1.50* 1.34 1.19  CALCIUM 8.1* 8.1* 8.0*  MG 3.0* 2.8*  --   PHOS 3.3 3.1  --    Liver Function Tests:  Recent Labs  10/15/14 0413  AST 22  ALT 22  ALKPHOS 166*  BILITOT 1.0  PROT 6.3  ALBUMIN 2.4*   No results for input(s): LIPASE, AMYLASE in the last 72 hours. CBC:  Recent Labs  10/16/14 0345 10/17/14 0500  WBC 13.5* 12.0*  NEUTROABS  --  9.7*  HGB 10.2* 10.2*  HCT 33.2* 32.9*  MCV 87.4 86.1  PLT 318 294   Cardiac Enzymes: No results for input(s): CKTOTAL, CKMB, CKMBINDEX, TROPONINI in the last 72 hours. BNP: Invalid input(s): POCBNP D-Dimer: No results for input(s): DDIMER in the last 72 hours. Hemoglobin A1C: No results for input(s): HGBA1C in the last 72  hours. Fasting Lipid Panel: No results for input(s): CHOL, HDL, LDLCALC, TRIG, CHOLHDL, LDLDIRECT in the last 72 hours. Thyroid Function Tests: No results for input(s): TSH, T4TOTAL, T3FREE, THYROIDAB in the last 72 hours.  Invalid input(s): FREET3 Anemia Panel: No results for input(s): VITAMINB12, FOLATE, FERRITIN, TIBC, IRON, RETICCTPCT in the last 72 hours. Coag Panel:   Lab Results  Component Value Date   INR 1.51* 10/15/2014   INR 1.15 10/27/2013   INR 1.33 03/04/2012    RADIOLOGY: Ct Foot Right Wo Contrast  10/16/2014   CLINICAL DATA:  Chronic osteomyelitis of the right foot. Lytic lesion along the second metatarsal.  EXAM: CT OF THE RIGHT FOOT WITHOUT CONTRAST  TECHNIQUE: Multidetector CT imaging of the right foot was performed according to the standard protocol. Multiplanar CT image reconstructions were also generated.  COMPARISON:  10/14/2014  FINDINGS: Erosions, some with overhanging edges and cortical irregularity, are observed along the distal fibula; distal tibia; medial talus proximally; the CT if the calcaneocuboid articulation; the distal navicular; along much of the Lisfranc joint; and along the first metatarsophalangeal joint. Some of these erosions are along  the intermetatarsal bursal region and not directly along the joint itself, including the larger erosion along the base of the second metatarsal which measures proximally 1.6 by 1.0 cm on image 21 of series 605. I do not see a bony sequestrum within this erosion although there is some lucency in the marginal bone as shown on image 22 series 605. There are some spiculations bone in some of the other erosions, such as in the medial malleolar erosion on image 30 of series 605.  There is diffuse subcutaneous and cutaneous edema along the ankle and foot, most severely laterally were there is skin irregularity, wrinkling, and quite possibly ulceration -correlation with physical inspection suggested. Small bony fragments are present  below the medial and lateral malleolus could. Highly attenuated distal tibialis posterior tendon, distal to the accessory navicular. Atrophic plantar musculature in the foot. Small amount of gas in the subcutaneous tissues along the dorsal lateral foot, image 66 series 606.  Plantar and Achilles calcaneal spurs are somewhat fragmented and irregular.  IMPRESSION: 1. Suspected cellulitis and probable lateral ulceration along the foot, with a tiny amount of gas in the subcutaneous tissues. 2. Large marginal erosions with overhanging edges throughout much of the hindfoot and midfoot and also at the first metatarsophalangeal joint. The largest erosive lesion is at the second metatarsal base. Given the numerous areas involvement, gout is considered of fairly high probability. Chronic multifocal osteomyelitis seems less likely. That said, there is likely soft tissue infection laterally. MRI can sometimes be useful in differentiating gout from osteomyelitis based on the degree of associated edema, but I do note that the patient has a pacemaker. Needle aspiration of the second toe cavity may be warranted. It might also be helpful to image the contralateral foot to look for signs of gout.   Electronically Signed   By: Van Clines M.D.   On: 10/16/2014 08:34   Dg Chest Port 1 View  10/15/2014   CLINICAL DATA:  Atrial flutter; hypertension  EXAM: PORTABLE CHEST - 1 VIEW  COMPARISON:  October 28, 2013  FINDINGS: Pacemaker leads are attached to the right atrium, right ventricle, and left ventricle, stable. No pneumothorax. There is cardiomegaly with mild pulmonary venous hypertension. No edema or consolidation. No effusions. No adenopathy.  IMPRESSION: Suspect a degree of chronic volume overload. Pacemaker leads unchanged. No pneumothorax.   Electronically Signed   By: Lowella Grip III M.D.   On: 10/15/2014 07:26   Dg Foot Complete Right  10/14/2014   CLINICAL DATA:  Lateral foot wound for 1 month  EXAM: RIGHT  FOOT COMPLETE - 3+ VIEW  COMPARISON:  None.  FINDINGS: No acute fracture or dislocation is noted. Increased cortical thickening is noted within the fifth metatarsal as well as areas of lucency in the bases of the second through fourth metatarsals. The known lateral wound is seen. These changes are highly suspicious for underlying osteomyelitis. Calcaneal spurs are seen.  IMPRESSION: Soft tissue wound with associated bony changes suspicious for osteomyelitis. MRI may be helpful for further evaluation.   Electronically Signed   By: Inez Catalina M.D.   On: 10/14/2014 18:23      ASSESSMENT: Kathyrn Lass:    Nonischemic cardiomyopathy: Compensated. No evidence of volume overload at this time.   AFib: Holding Pradaxa due to hematuria and recent surgery.   Restart Pradaxa for stroke prevention when safe from a bleeding standpoint.  Of note, Pradaxa will be therapeutic very soon after swallowing the pill, unlike Coumadin which takes several days  to become therapeutic.  Could use IV heparin when the urologist thinks it is safe from a bleeding standpoint, since this can be shut off and become subtherapeutic quickly.    Jettie Booze, MD  10/17/2014  7:46 AM

## 2014-10-17 NOTE — Progress Notes (Signed)
Pt had SVT with HR 119-150.  MD notified.

## 2014-10-17 NOTE — Op Note (Signed)
[name], [name] NO.:  [id number]  MEDICAL RECORD NO.:  [id number]  LOCATION:  7824                         FACILITY:  [facility]  PHYSICIAN:  Marshall Cork. Jeffie Pollock, M.D.    DATE OF BIRTH:  05-Apr-1961  DATE OF PROCEDURE:  10/16/2014 DATE OF DISCHARGE:                              OPERATIVE REPORT   PROCEDURE: 1. Cystoscopy with evacuation of clots. 2. Bilateral retrograde pyelograms with interpretation. 3. Transurethral resection of large bladder tumor.  PREOPERATIVE DIAGNOSIS:  Clot retention and right hydronephrosis.  POSTOPERATIVE DIAGNOSIS:  Clot retention and right hydronephrosis with a large necrotic bladder tumor.  SURGEON:  Marshall Cork. Jeffie Pollock, M.D.  ANESTHESIA:  General.  SPECIMEN:  Tumor chips from the body of the tumor and the base of the tumor.  DRAINS:  Twenty-two-French 10 mL Foley catheter.  BLOOD LOSS:  Minimal.  COMPLICATIONS:  None.  INDICATIONS:  [name] is a 54 year old white male who was admitted to the medical service.  He was found to have clot retention and right hydronephrosis on a CT scan.  He was actually transferred from [facility] where the CT was done without contrast.  Renal ultrasound in [place] revealed persistent hydro, it was felt that cystoscopy with retrograde pyelogram and clot evacuation and possible TURBT was indicated.  FINDINGS AND PROCEDURE:  He had been on Zosyn perioperatively.  He was taken to the operating room where general anesthetic was induced.  He was placed in lithotomy position.  His perineum and genitalia were prepped with Betadine solution, and he was draped in usual sterile fashion.  SCD hose were in place.  Cystoscopy was performed using a 22- [place] scope and 30-degrees lens.  Examination revealed a normal urethra.  The external sphincter was intact.  The prostatic urethra was approximately 2 cm in length with bilobar hyperplasia without obstruction.  Examination of bladder revealed turbid urine  that was quite malodorous and upon placement of the scope, a small amount of dark foul smelling clot was evacuated.  On inspection, there appeared to be an adherent clot on the left lateral wall but it would not irrigate out and on further inspection, it appeared actually to be a large necrotic bladder tumor.  Once the initial cystoscopic inspection was performed, retrograde pyelography was performed.  The right ureteral orifice was cannulated with a 5-French open-end catheter and contrast was instilled.  This revealed an unremarkable distal ureter. The internal collecting system had some mild dilation, but the ureter appeared to drain readily and no filling defects were noted.  A left retrograde pyelogram was performed with a 5-French open-end catheter and contrast.  Left retrograde pyelogram demonstrated a normal ureter and intrarenal collecting system without filling defects, although there was some mild narrowing of the mid ureter approximately at the level of the inferior SI joint that had some suggestion of an extrinsic compression on the lateral side but there was no obstruction and the left side drained promptly.  Once the retrograde pyelograms were performed, I inserted a 28-French continuous flow resectoscope sheath. This was fitted with an [place] handle, a bipolar loop, and the 30-degree lens.  Saline was used as an irrigant.  The necrotic tissue on  the left lateral wall was then resected.  It was very firm and avascular. After considerable period of resection, I was able to get down to the base of the apparent tumor. Around the margin of the tumor, there was some bubbly mucosa that potentially had viable tumor, although it was difficult to say whether it was that or just edematous mucosa. The diameter of the tumor was about 5-6cm.   Once the bulk of the tumor had been resected, the bladder was evacuated free of the chips which had almost a black appearance.  At  this point, additional tumor tissue was resected from the base of the lesion down to the level of what was felt to be the muscle.  Surprisingly there was minimal bleeding during the resection, but a few small bleeders were fulgurated as the resection progressed.  Once the specimen had been removed from the base, the surrounding mucosa was fulgurated with the electrode as well.  The bladder was then evacuated free of the base chips which were sent as a separate specimen.  At this point, final inspection revealed no bleeding.  I did not feel I had resected the entire tumor, but it extended too deeply into the wall of the bladder to consider that.  Further inspection of the mucosa revealed diffuse hemorrhagic changes, but no other areas of tumor. Ureteral orifices were intact.  The resectoscope was then removed and a 22-French 10 mL Foley was placed without difficulty.  The balloon was filled with 10 mL sterile fluid. The catheter drained clear irrigant.  At this point, the catheter was placed to straight drainage.  The patient was taken down from lithotomy position.  His anesthetic was reversed, and he was moved to recovery in stable condition.  There were no complications.     Marshall Cork. Jeffie Pollock, M.D.     JJW/MEDQ  D:  10/16/2014  T:  10/17/2014  Job:  [id number]

## 2014-10-17 NOTE — Progress Notes (Addendum)
Patient ID: [name], male   DOB: 09/09/1960, 54 y.o.   MRN: [id number]   I spoke with Dr. Tresa Moore in pathology and the specimen from the TURBT showed no viable tumor.   The specimen from the base of the lesion showed benign muscularis propria but viable tumor.  The CT today shows some thickening of the left lateral bladder base in the area of the recent resection but no obvious extravesical disease.  There is no adenopathy.   There is a non specific hypoechoic area in the prostate, but CT is not a great study for prostate cancer and his prostate was benign on exam.    Imp:  The mass was most consistent with a cancer on resection but no viable tumor was found.  Plan:  At this point I think he will need close follow up with cystoscopy and a repeat biopsy if the lesion demonstrates active growth.   I think repeat cystoscopy at one month as an outpatient would be appropriate.    His PVR at 1227 was 430ml.   I will have that repeated after his next void.

## 2014-10-17 NOTE — Progress Notes (Addendum)
Patient ID: [name], male   DOB: August 19, 1960, 54 y.o.   MRN: [id number] 1 Day Post-Op  Subjective: [name] is doing well POD#1 following cystoscopy with Bil retrogrades and TURBT for a large, muscle invasive bladder tumor.   The tumor was very necrotic with almost a black coloration.   The retrogrades showed no obstruction but there was some fullness of the right intrarenal collecting system and a possible extrinsic compression of the left mid-distal ureter which is worrisome for possible lymphadenopathy.   He reports no complaints this morning and the foley is draining clear urine.   ROS:  Review of Systems  Constitutional: Negative for fever.  Gastrointestinal: Negative for abdominal pain.  Genitourinary: Negative for hematuria.    Anti-infectives: Anti-infectives    Start     Dose/Rate Route Frequency Ordered Stop   10/16/14 1800  ceFAZolin (ANCEF) IVPB 2 g/50 mL premix     2 g 100 mL/hr over 30 Minutes Intravenous Every 8 hours 10/16/14 1512     10/14/14 1900  [MAR Hold]  piperacillin-tazobactam (ZOSYN) IVPB 3.375 g  Status:  Discontinued     (MAR Hold since 10/16/14 1352)   3.375 g 12.5 mL/hr over 240 Minutes Intravenous Every 8 hours 10/14/14 1829 10/16/14 1512      Current Facility-Administered Medications  Medication Dose Route Frequency Provider Last Rate Last Dose  . acetaminophen (TYLENOL) tablet 650 mg  650 mg Oral Q6H PRN Chesley Mires, MD      . allopurinol (ZYLOPRIM) tablet 100 mg  100 mg Oral Daily Chesley Mires, MD   100 mg at 10/16/14 1021  . amiodarone (PACERONE) tablet 200 mg  200 mg Oral BID Chesley Mires, MD   200 mg at 10/16/14 2154  . antiseptic oral rinse (CPC / CETYLPYRIDINIUM CHLORIDE 0.05%) solution 7 mL  7 mL Mouth Rinse q12n4p Chesley Mires, MD   7 mL at 10/15/14 1738  . atorvastatin (LIPITOR) tablet 10 mg  10 mg Oral q1800 Chesley Mires, MD   10 mg at 10/16/14 1741  . ceFAZolin (ANCEF) IVPB 2 g/50 mL premix  2 g Intravenous Q8H Costin Karlyne Greenspan, MD   2 g at  10/17/14 0104  . chlorhexidine (PERIDEX) 0.12 % solution 15 mL  15 mL Mouth Rinse BID Chesley Mires, MD   15 mL at 10/16/14 2152  . cloNIDine (CATAPRES) tablet 0.2 mg  0.2 mg Oral BID Chesley Mires, MD   0.2 mg at 10/16/14 2153  . collagenase (SANTYL) ointment   Topical Daily Meridee Score V, MD      . digoxin (LANOXIN) tablet 0.125 mg  0.125 mg Oral Daily Chesley Mires, MD   0.125 mg at 10/16/14 1021  . gemfibrozil (LOPID) tablet 600 mg  600 mg Oral BID AC Chesley Mires, MD   600 mg at 10/16/14 1703  . insulin aspart (novoLOG) injection 0-15 Units  0-15 Units Subcutaneous 6 times per day Peyton Najjar, MD   3 Units at 10/17/14 0057  . insulin aspart (novoLOG) injection 0-5 Units  0-5 Units Subcutaneous QHS Chesley Mires, MD   3 Units at 10/16/14 0003  . irbesartan (AVAPRO) tablet 150 mg  150 mg Oral Daily Chesley Mires, MD   150 mg at 10/16/14 1021  . metoprolol tartrate (LOPRESSOR) tablet 100 mg  100 mg Oral BID Chesley Mires, MD   100 mg at 10/16/14 2154  . pneumococcal 23 valent vaccine (PNU-IMMUNE) injection 0.5 mL  0.5 mL Intramuscular Tomorrow-1000 Costin Karlyne Greenspan, MD      .  prazosin (MINIPRESS) capsule 1 mg  1 mg Oral BID Chesley Mires, MD   1 mg at 10/16/14 2154     Objective: Vital signs in last 24 hours: Temp:  [97.4 F (36.3 C)-98.7 F (37.1 C)] 98.2 F (36.8 C) (03/02 0454) Pulse Rate:  [71-93] 93 (03/02 0454) Resp:  [11-19] 18 (03/02 0454) BP: (106-145)/(60-91) 126/71 mmHg (03/02 0454) SpO2:  [90 %-100 %] 96 % (03/02 0454) Weight:  [114.851 kg (253 lb 3.2 oz)] 114.851 kg (253 lb 3.2 oz) (03/02 0007)  Intake/Output from previous day: 03/01 0701 - 03/02 0700 In: 850 [I.V.:800; IV Piggyback:50] Out: 3301 [Urine:3300; Stool:1] Intake/Output this shift: Total I/O In: -  Out: 1450 [Urine:1450]   Physical Exam  Constitutional: He is well-developed, well-nourished, and in no distress.  Genitourinary:  Foley draining clear urine.   Vitals reviewed.   Lab Results:   Recent Labs   10/16/14 0345 10/17/14 0500  WBC 13.5* 12.0*  HGB 10.2* 10.2*  HCT 33.2* 32.9*  PLT 318 294   BMET  Recent Labs  10/16/14 0345 10/17/14 0505  NA 139 138  K 4.5 4.2  CL 109 109  CO2 23 24  GLUCOSE 202* 159*  BUN 51* 37*  CREATININE 1.34 1.19  CALCIUM 8.1* 8.0*   PT/INR  Recent Labs  10/15/14 0413  LABPROT 18.4*  INR 1.51*   ABG No results for input(s): PHART, HCO3 in the last 72 hours.  Invalid input(s): PCO2, PO2  Studies/Results: Ct Foot Right Wo Contrast  10/16/2014   CLINICAL DATA:  Chronic osteomyelitis of the right foot. Lytic lesion along the second metatarsal.  EXAM: CT OF THE RIGHT FOOT WITHOUT CONTRAST  TECHNIQUE: Multidetector CT imaging of the right foot was performed according to the standard protocol. Multiplanar CT image reconstructions were also generated.  COMPARISON:  10/14/2014  FINDINGS: Erosions, some with overhanging edges and cortical irregularity, are observed along the distal fibula; distal tibia; medial talus proximally; the CT if the calcaneocuboid articulation; the distal navicular; along much of the Lisfranc joint; and along the first metatarsophalangeal joint. Some of these erosions are along the intermetatarsal bursal region and not directly along the joint itself, including the larger erosion along the base of the second metatarsal which measures proximally 1.6 by 1.0 cm on image 21 of series 605. I do not see a bony sequestrum within this erosion although there is some lucency in the marginal bone as shown on image 22 series 605. There are some spiculations bone in some of the other erosions, such as in the medial malleolar erosion on image 30 of series 605.  There is diffuse subcutaneous and cutaneous edema along the ankle and foot, most severely laterally were there is skin irregularity, wrinkling, and quite possibly ulceration -correlation with physical inspection suggested. Small bony fragments are present below the medial and lateral malleolus  could. Highly attenuated distal tibialis posterior tendon, distal to the accessory navicular. Atrophic plantar musculature in the foot. Small amount of gas in the subcutaneous tissues along the dorsal lateral foot, image 66 series 606.  Plantar and Achilles calcaneal spurs are somewhat fragmented and irregular.  IMPRESSION: 1. Suspected cellulitis and probable lateral ulceration along the foot, with a tiny amount of gas in the subcutaneous tissues. 2. Large marginal erosions with overhanging edges throughout much of the hindfoot and midfoot and also at the first metatarsophalangeal joint. The largest erosive lesion is at the second metatarsal base. Given the numerous areas involvement, gout is considered of fairly high  probability. Chronic multifocal osteomyelitis seems less likely. That said, there is likely soft tissue infection laterally. MRI can sometimes be useful in differentiating gout from osteomyelitis based on the degree of associated edema, but I do note that the patient has a pacemaker. Needle aspiration of the second toe cavity may be warranted. It might also be helpful to image the contralateral foot to look for signs of gout.   Electronically Signed   By: Van Clines M.D.   On: 10/16/2014 08:34     Assessment: s/p Procedure(s): CYSTO WITH BILATERAL /RETROGRADE,TURBT WITH GYRUS TRANSURETHRAL RESECTION OF BLADDER TUMOR WITH GYRUS (TURBT-GYRUS)  He has a muscle invasive bladder cancer and we need to rule out lymph node mets based on the retrograde results.    Plan: I am going to order a CT AP with contrast for further evaluation.    His Creatinine is normal.  I will D/C the foley this morning and follow PVR's.    LOS: 3 days    Marilla Boddy J 10/17/2014

## 2014-10-17 NOTE — Progress Notes (Addendum)
Patient ID: [name], male   DOB: 03-11-1961, 54 y.o.   MRN: [id number]  [facility] PROGRESS NOTE  [name]:[id number] DOB: [birth year] DOA: 10/14/2014 PCP: [name], MD   Brief narrative:    55 yo male with HTN, CAD, morbid obesity, atrial flutter, DM type II, on pradaxa as outpt for a fib presented to [place] with painless hematuria. He described this as frank blood. He was seen by urology (Dr. Exie Parody) at [facility] and started on bladder irrigation. Cystoscopy showed posterior wall bladder tumor. It was recommended that he be tx to [facility] to have further cardiac assessment prior to having additional urology intervention. He had urine culture which showed Staph aureus, MSSA.  - While at [facility] he developed RVR from his A fib and started on cardizem gtt. - He has wounds on both his feet, Rt > Lt, have been present for at least 1 month, never sought attention for these. - has past surgical history that includes Bi-V implantable cardioverter defib (10/27/2013); A- flutter ablation (03/04/2012)  SIGNIFICANT EVENTS  2/25 Admit to [facility] 2/28 Transfer to [facility]  Assessment/Plan:    Hematuria with bladder tumor noted at [facility]. - Urology was consulted, appreciate input, will continue to follow up on recommendations  - s/p cystoscopy and evacuation of blood clots, transurethral resection of bladder tumor 3/2 by Dr. Jeffie Pollock   Stage 2 CKD - Creatinine stable, continue to monitor  Leukocytosis - from UTI, ? Contribution for Right foot cellulitis - pt on Ancef for UTI bu tif WBC go up, may need additional ABX coverage   Chronic systolic heart failure  - cardiology consulted preop, appreciate input - Most recent 2-D echo was done in September 2015 and showed an ejection fraction of 35%  - currently he is euvolemic - Continue Avapro, beta blocker  A. fib with RVR - Continue amiodarone, digoxin, metoprolol - Pradaxa now on hold  Hyperlipidemia  -  continue Lopid and Lipitor  Hypertension  - continue clonidine and other medications as above  B/L foot wounds. - Dr. Sharol Given consulted and evaluated patient while hospitalized - Patient underwent CT scan of the right foot which showed changes consistent with chronic gout, and will follow-up as an outpatient Dr. Sharol Given  UTI with MSSA on urine cx from [facility] 2/25. - Continue ancef - f/u blood cultures [place] from 2/25  DM type II with renal complications. - SSI - hold outpt oral agents for now  46mm nodule Lt lower lung on CT abd/pelvis 10/11/14 - outpatient follow up   DVT prophylaxis - SCD's  Code Status: Full.  Family Communication:  plan of care discussed with the patient, mother over the phone  Disposition Plan: Home when stable.   IV access:  Peripheral IV  Procedures and diagnostic studies:    Ct Abdomen Pelvis W Wo Contrast  10/17/2014   Mild wall thickening in the urinary bladder along the left posterior wall, potentially representing bladder tumor or site of resection. 2. Several hypodense renal lesions are technically too small to characterize although statistically likely to be cysts. 3. 1.3 cm focus of hypoenhancement in the right prostate gland on portal venous phase images, nonspecific. Correlate with prostate physical exam and PSA level in determining whether further workup is warranted. 4. Mild cardiomegaly. 5. 2 mm left lower lobe pulmonary nodule, no change from 10/11/2014.    Ct Foot Right Wo Contrast 10/16/2014 Suspected cellulitis and probable lateral ulceration along the foot, with a tiny amount of gas in  the subcutaneous tissues. 2. Large marginal erosions with overhanging edges throughout much of the hindfoot and midfoot and also at the first metatarsophalangeal joint. The largest erosive lesion is at the second metatarsal base. Given the numerous areas involvement, gout is considered of fairly high probability. Chronic multifocal osteomyelitis seems less likely.  That said, there is likely soft tissue infection laterally. MRI can sometimes be useful in differentiating gout from osteomyelitis based on the degree of associated edema, but I do note that the patient has a pacemaker. Needle aspiration of the second toe cavity may be warranted. It might also be helpful to image the contralateral foot to look for signs of gout.   Electronically Signed   By: Van Clines M.D.   On: 10/16/2014 08:34   Dg Chest Port 1 Noni Saupe  10/15/2014   Suspect a degree of chronic volume overload. Pacemaker leads unchanged. No pneumothorax.     Dg Foot Complete Right  10/14/2014   Soft tissue wound with associated bony changes suspicious for osteomyelitis. MRI may be helpful for further evaluation.     Medical Consultants:  Urology Cardiology PCCM  Other Consultants:  None  IAnti-Infectives:   Rochel Brome, MD  [facility] Pager [phone]  If 7PM-7AM, please contact night-coverage [email or web address] Password [email or web address] 10/17/2014, 4:25 PM   LOS: 3 days   HPI/Subjective: No events overnight.   Objective: Filed Vitals:   10/16/14 2334 10/17/14 0007 10/17/14 0454 10/17/14 1305  BP:  122/62 126/71 114/72  Pulse:  71 93 105  Temp: 98.1 F (36.7 C) 98.4 F (36.9 C) 98.2 F (36.8 C) 98 F (36.7 C)  TempSrc: Oral Oral Oral Axillary  Resp:  19 18   Height:  5\' 11"  (1.803 m)    Weight:  114.851 kg (253 lb 3.2 oz)    SpO2:  97% 96% 98%    Intake/Output Summary (Last 24 hours) at 10/17/14 1625 Last data filed at 10/17/14 1547  Gross per 24 hour  Intake    650 ml  Output   2976 ml  Net  -2326 ml    Exam:   General:  Pt is alert, follows commands appropriately, not in acute distress  Cardiovascular: Regular rate and rhythm, S1/S2, no murmurs, no rubs, no gallops  Respiratory: Clear to auscultation bilaterally, no wheezing, no crackles, no rhonchi  Abdomen: Soft, non tender, non distended, bowel sounds present, no guarding  Extremities: pulses DP and PT  palpable bilaterally, right foot mild erythema    Data Reviewed: Basic Metabolic Panel:  Recent Labs Lab 10/15/14 0413 10/16/14 0345 10/17/14 0505  NA 141 139 138  K 4.8 4.5 4.2  CL 112 109 109  CO2 21 23 24   GLUCOSE 237* 202* 159*  BUN 62* 51* 37*  CREATININE 1.50* 1.34 1.19  CALCIUM 8.1* 8.1* 8.0*  MG 3.0* 2.8*  --   PHOS 3.3 3.1  --    Liver Function Tests:  Recent Labs Lab 10/15/14 0413  AST 22  ALT 22  ALKPHOS 166*  BILITOT 1.0  PROT 6.3  ALBUMIN 2.4*   CBC:  Recent Labs Lab 10/15/14 0413 10/16/14 0345 10/17/14 0500  WBC 12.2* 13.5* 12.0*  NEUTROABS  --   --  9.7*  HGB 10.2* 10.2* 10.2*  HCT 32.8* 33.2* 32.9*  MCV 86.3 87.4 86.1  PLT 294 318 294   CBG:  Recent Labs Lab 10/16/14 2244 10/17/14 0041 10/17/14 0451 10/17/14 0757 10/17/14 1149  GLUCAP 192* 158* 151* 152* 156*  Recent Results (from the past 240 hour(s))  MRSA PCR Screening     Status: None   Collection Time: 10/14/14  5:56 PM  Result Value Ref Range Status   MRSA by PCR NEGATIVE NEGATIVE Final    Comment:        The GeneXpert MRSA Assay (FDA approved for NASAL specimens only), is one component of a comprehensive MRSA colonization surveillance program. It is not intended to diagnose MRSA infection nor to guide or monitor treatment for MRSA infections.   Clostridium Difficile by PCR     Status: None   Collection Time: 10/15/14  6:04 PM  Result Value Ref Range Status   C difficile by pcr NEGATIVE NEGATIVE Final    Comment: Performed at [facility]     Scheduled Meds: . allopurinol  100 mg Oral Daily  . amiodarone  200 mg Oral BID  . antiseptic oral rinse  7 mL Mouth Rinse q12n4p  . atorvastatin  10 mg Oral q1800  .  ceFAZolin (ANCEF) IV  2 g Intravenous Q8H  . chlorhexidine  15 mL Mouth Rinse BID  . cloNIDine  0.2 mg Oral BID  . collagenase   Topical Daily  . digoxin  0.125 mg Oral Daily  . gemfibrozil  600 mg Oral BID AC  . insulin aspart  0-15 Units  Subcutaneous 6 times per day  . insulin aspart  0-5 Units Subcutaneous QHS  . irbesartan  150 mg Oral Daily  . metoprolol tartrate  100 mg Oral BID  . pneumococcal 23 valent vaccine  0.5 mL Intramuscular Tomorrow-1000  . prazosin  1 mg Oral BID   Continuous Infusions:

## 2014-10-18 DIAGNOSIS — D303 Benign neoplasm of bladder: Secondary | ICD-10-CM | POA: Diagnosis not present

## 2014-10-18 LAB — BASIC METABOLIC PANEL
Anion gap: 11 (ref 5–15)
BUN: 34 mg/dL — AB (ref 6–23)
CALCIUM: 7.8 mg/dL — AB (ref 8.4–10.5)
CO2: 19 mmol/L (ref 19–32)
CREATININE: 1.31 mg/dL (ref 0.50–1.35)
Chloride: 104 mmol/L (ref 96–112)
GFR calc Af Amer: 69 mL/min — ABNORMAL LOW (ref >90)
GFR calc non Af Amer: 60 mL/min — ABNORMAL LOW (ref >90)
GLUCOSE: 199 mg/dL — AB (ref 70–99)
Potassium: 4.4 mmol/L (ref 3.5–5.1)
SODIUM: 134 mmol/L — AB (ref 135–145)

## 2014-10-18 LAB — CBC
HEMATOCRIT: 33.1 % — AB (ref 39.0–52.0)
HEMOGLOBIN: 10.3 g/dL — AB (ref 13.0–17.0)
MCH: 26.5 pg (ref 26.0–34.0)
MCHC: 31.1 g/dL (ref 30.0–36.0)
MCV: 85.3 fL (ref 78.0–100.0)
Platelets: 271 10*3/uL (ref 150–400)
RBC: 3.88 MIL/uL — ABNORMAL LOW (ref 4.22–5.81)
RDW: 15.9 % — AB (ref 11.5–15.5)
WBC: 10.2 10*3/uL (ref 4.0–10.5)

## 2014-10-18 LAB — GLUCOSE, CAPILLARY
Glucose-Capillary: 178 mg/dL — ABNORMAL HIGH (ref 70–99)
Glucose-Capillary: 180 mg/dL — ABNORMAL HIGH (ref 70–99)
Glucose-Capillary: 192 mg/dL — ABNORMAL HIGH (ref 70–99)
Glucose-Capillary: 268 mg/dL — ABNORMAL HIGH (ref 70–99)
Glucose-Capillary: 275 mg/dL — ABNORMAL HIGH (ref 70–99)

## 2014-10-18 LAB — CLOSTRIDIUM DIFFICILE BY PCR: Toxigenic C. Difficile by PCR: NEGATIVE

## 2014-10-18 MED ORDER — INSULIN DETEMIR 100 UNIT/ML ~~LOC~~ SOLN: 50.0000 [IU] | Freq: Two times a day (BID) | SUBCUTANEOUS | Status: DC

## 2014-10-18 MED ORDER — LOPERAMIDE HCL 2 MG PO CAPS: 2.0000 mg | ORAL_CAPSULE | ORAL | Status: DC | PRN

## 2014-10-18 MED ORDER — AMIODARONE HCL 200 MG PO TABS: 200.0000 mg | ORAL_TABLET | Freq: Two times a day (BID) | ORAL | Status: DC

## 2014-10-18 MED ORDER — ALLOPURINOL 100 MG PO TABS: 100.0000 mg | ORAL_TABLET | Freq: Every day | ORAL | Status: DC

## 2014-10-18 MED ORDER — CEPHALEXIN 500 MG PO CAPS
500.0000 mg | ORAL_CAPSULE | Freq: Four times a day (QID) | ORAL | Status: DC
Start: 2014-10-18 — End: 2014-11-29

## 2014-10-18 MED ORDER — LOPERAMIDE HCL 2 MG PO CAPS
2.0000 mg | ORAL_CAPSULE | ORAL | Status: DC | PRN
  Administered 2014-10-18: 2 mg via ORAL
  Filled 2014-10-18: qty 1

## 2014-10-18 NOTE — Discharge Instructions (Signed)
Transurethral Resection, Bladder Tumor A cancerous growth (tumor) can develop on the inside wall of the bladder. The bladder is the organ that holds urine. One way to remove the tumor is a procedure called a transurethral resection. The tumor is removed (resected) through the tube that carries urine from the bladder out of the body (urethra). No cuts (incisions) are made in the skin. Instead, the procedure is done through a thin telescope, called a resectoscope. Attached to it is a light and usually a tiny camera. The resectoscope is put into the urethra. In men, the urethra opens at the end of the penis. In women, it opens just above the vagina.  A transurethral resection is usually used to remove tumors that have not gotten too big or too deep. These are called Stage 0, Stage 1 or Stage 2 bladder cancers. LET YOUR CAREGIVER KNOW ABOUT:  On the day of the procedure, your caregivers will need to know the last time you had anything to eat or drink. This includes water, gum, and candy. In advance, make sure they know about:   Any allergies.  All medications you are taking, including:  Herbs, eyedrops, over-the-counter medications and creams.  Blood thinners (anticoagulants), aspirin or other drugs that could affect blood clotting.  Use of steroids (by mouth or as creams).  Previous problems with anesthetics, including local anesthetics.  Possibility of pregnancy, if this applies.  Any history of blood clots.  Any history of bleeding or other blood problems.  Previous surgery.  Smoking history.  Any recent symptoms of colds or infections.  Other health problems. RISKS AND COMPLICATIONS This is usually a safe procedure. Every procedure has risks, though. For a transurethral resection, they include:  Infection. Antibiotic medication would need to be taken.  Bleeding.  Light bleeding may last for several days after the procedure.  If bleeding continues or is heavy, the bladder may  need rinsing. Or, a new catheter might be put in for awhile.  Sometimes bed rest is needed.  Urination problems.  Pain and burning can occur when urinating. This usually goes away in a few days.  Scarring from the procedure can block the flow of urine.  Bladder damage.  It can be punctured or torn during removal of the tumor. If this happens, a catheter might be needed for longer. Antibiotics would be taken while the bladder heals.  Urine can leak through the hole or tear into the abdomen. If this happens, surgery may be needed to repair the bladder. BEFORE THE PROCEDURE   A medical evaluation will be done. This may include:  A physical examination.  Urine test. This is to make sure you do not have a urinary tract infection.  Blood tests.  A test that checks the heart's rhythm (electrocardiogram).  Talking with an anesthesiologist. This is the person who will be in charge of the medication (anesthesia) to keep you from feeling pain during the transurethral resection. You might be asleep during the procedure (general anesthesia) or numb from the waist down, but awake during the procedure (spinal anesthesia). Ask your surgeon what to expect.  The person who is having a transurethral resection needs to give what is called informed consent. This requires signing a legal paper that gives permission for the procedure. To give informed consent:  You must understand how the procedure is done and why.  You must be told all the risks and benefits of the procedure.  You must sign the consent. Sometimes a legal guardian  can do this.  Signing should be witnessed by a healthcare professional.  The day before the surgery, eat only a light dinner. Then, do not eat or drink anything for at least 8 hours before the surgery. Ask your caregiver if it is OK to take any needed medicines with a sip of water.  Arrive at least an hour before the surgery or whenever your surgeon recommends. This will  give you time to check in and fill out any needed paperwork. PROCEDURE  The preparation:  You will change into a hospital gown.  A needle will be inserted in your arm. This is an intravenous access tube (IV). Medication will be able to flow directly into your body through this needle.  Small monitors will be put on your body. They are used to check your heart, blood pressure, and oxygen level.  You might be given medication that will help you relax (sedative).  You will be given a general anesthetic or spinal anesthesia.  The procedure:  Once you are asleep or numb from the waist down, your legs will be placed in stirrups.  The resectoscope will be passed through the urethra into the bladder.  Fluid will be passed through the resectoscope. This will fill the bladder with water.  The surgeon will examine the bladder through the scope. If the scope has a camera, it can take pictures from inside the bladder. They can be projected onto a TV screen.  The surgeon will use various tools to remove the tumor in small pieces. Sometimes a laser (a beam of light energy) is used. Other tools may use electric current.  A tube (catheter) will often be placed so that urine can drain into a bag outside the body. This process helps stop bleeding. This tube keeps blood clots from blocking the urethra.  The procedure usually takes 30 to 45 minutes. AFTER THE PROCEDURE   You will stay in a recovery area until the anesthesia has worn off. Your blood pressure and pulse will be checked every so often. Then you will be taken to a hospital room.  You may continue to get fluids through the IV for awhile.  Some pain is normal. The catheter might be uncomfortable. Pain is usually not severe. If it is, ask for pain medicine.  Your urine may look bloody after a transurethral resection. This is normal.  If bleeding is heavy, a hospital caregiver may rinse out the bladder (irrigation) through the  catheter.  Once the urine is clear, the catheter will be taken out.  You will need to stay in the hospital until you can urinate on your own.  Most people stay in the hospital for up to 4 days. PROGNOSIS   Transurethral resection is considered the best way to treat bladder tumors that are not too far along. For most people, the treatment is successful. Sometimes, though, more treatment is needed.  Bladder cancers can come back even after a successful procedure. Because of this, be sure to have a checkup with your caregiver every 3 to 6 months. If everything is OK for 3 years, you can reduce the checkups to once a year. Document Released: 05/30/2009 Document Revised: 10/26/2011 Document Reviewed: 09/12/2013 [facility] Patient Information 2015 [place], [place]. This information is not intended to replace advice given to you by your health care provider. Make sure you discuss any questions you have with your health care provider.

## 2014-10-18 NOTE — Telephone Encounter (Signed)
No return call received to date.  Patient is currently admitted at [facility] for cellulitis of right lower extremity, atelectasis, chronic osteomyelitis of foot, right, and bladder cancer.

## 2014-10-18 NOTE — Progress Notes (Signed)
SUBJECTIVE:  Feels well after surgery.  No orthopnea or PND.  OBJECTIVE:   Vitals:   Filed Vitals:   10/17/14 0454 10/17/14 1305 10/17/14 2232 10/18/14 0443  BP: 126/71 114/72 108/74 117/85  Pulse: 93 105 87 84  Temp: 98.2 F (36.8 C) 98 F (36.7 C) 97.9 F (36.6 C) 97.7 F (36.5 C)  TempSrc: Oral Axillary Oral Oral  Resp: 18  18 18   Height:      Weight:    251 lb 6.4 oz (114.034 kg)  SpO2: 96% 98% 98% 98%   I&O's:    Intake/Output Summary (Last 24 hours) at 10/18/14 0726 Last data filed at 10/17/14 2215  Gross per 24 hour  Intake    700 ml  Output   1725 ml  Net  -1025 ml   TELEMETRY: Reviewed telemetry pt intermittently paced:     PHYSICAL EXAM General: Well developed, well nourished, in no acute distress Head:   Normal cephalic and atramatic  Lungs:  No wheezing. Heart:   HRRR S1 S2  No JVD.   Abdomen: abdomen soft and non-tender Msk:  Back normal,  Normal strength and tone for age. Extremities: both legs wrapped ; rash on right shin Neuro: Alert  Psych:  Normal affect, responds appropriately    LABS: Basic Metabolic Panel:  Recent Labs  10/16/14 0345 10/17/14 0505 10/18/14 0542  NA 139 138 134*  K 4.5 4.2 4.4  CL 109 109 104  CO2 23 24 19   GLUCOSE 202* 159* 199*  BUN 51* 37* 34*  CREATININE 1.34 1.19 1.31  CALCIUM 8.1* 8.0* 7.8*  MG 2.8*  --   --   PHOS 3.1  --   --    Liver Function Tests: No results for input(s): AST, ALT, ALKPHOS, BILITOT, PROT, ALBUMIN in the last 72 hours. No results for input(s): LIPASE, AMYLASE in the last 72 hours. CBC:  Recent Labs  10/17/14 0500 10/18/14 0542  WBC 12.0* 10.2  NEUTROABS 9.7*  --   HGB 10.2* 10.3*  HCT 32.9* 33.1*  MCV 86.1 85.3  PLT 294 271   Cardiac Enzymes: No results for input(s): CKTOTAL, CKMB, CKMBINDEX, TROPONINI in the last 72 hours. BNP: Invalid input(s): POCBNP D-Dimer: No results for input(s): DDIMER in the last 72 hours. Hemoglobin A1C: No results for input(s): HGBA1C  in the last 72 hours. Fasting Lipid Panel: No results for input(s): CHOL, HDL, LDLCALC, TRIG, CHOLHDL, LDLDIRECT in the last 72 hours. Thyroid Function Tests: No results for input(s): TSH, T4TOTAL, T3FREE, THYROIDAB in the last 72 hours.  Invalid input(s): FREET3 Anemia Panel: No results for input(s): VITAMINB12, FOLATE, FERRITIN, TIBC, IRON, RETICCTPCT in the last 72 hours. Coag Panel:   Lab Results  Component Value Date   INR 1.51* 10/15/2014   INR 1.15 10/27/2013   INR 1.33 03/04/2012    RADIOLOGY: Ct Abdomen Pelvis W Wo Contrast  10/17/2014   CLINICAL DATA:  Malignant urinary bladder neoplasm. Postop day 1 for cystoscopy and transurethral resection of bladder tumor.  EXAM: CT ABDOMEN AND PELVIS WITHOUT AND WITH CONTRAST  TECHNIQUE: Multidetector CT imaging of the abdomen and pelvis was performed following the standard protocol before and following the bolus administration of intravenous contrast.  CONTRAST:  184mL OMNIPAQUE IOHEXOL 300 MG/ML  SOLN  COMPARISON:  10/11/2014  FINDINGS: Lower chest: 2 mm left lower lobe nodule, image 8 series 6, stable. Mild cardiomegaly.  Hepatobiliary: 10/11/2014  Pancreas: Unremarkable  Spleen: Unremarkable  Adrenals/Urinary Tract: 1.3 cm left kidney upper  pole hypodense lesion, subject to volume averaging giving the vertical size, but not appreciably enhancing on multiplanar imaging measurements. Appearance compatible with benign cyst. Several other hypodense lesions of the left kidney and right anterior mid kidney are technically too small to characterize, although statistically likely to be cysts.  Subtle wall thickening along the left posterior urinary bladder wall. No abnormal urothelial enhancement on portal venous phase images. No additional filling defects along the urothelium noted on delayed phase images.  Stomach/Bowel: Unremarkable  Vascular/Lymphatic: Small retroperitoneal lymph nodes are not pathologically enlarged by size criteria. Mild aortoiliac  atherosclerosis. Right external iliac node 9 mm in short axis, image 80 series 4.  Reproductive: 1.3 cm focus of hypoenhancement in the right prostate gland, image 95 series 4.  Other: Mild symmetric presacral edema.  Musculoskeletal: Stable stranding along the left anterior abdominal wall subcutaneous tissues, possibly from injection, but technically nonspecific.  IMPRESSION: 1. Mild wall thickening in the urinary bladder along the left posterior wall, potentially representing bladder tumor or site of resection. 2. Several hypodense renal lesions are technically too small to characterize although statistically likely to be cysts. 3. 1.3 cm focus of hypoenhancement in the right prostate gland on portal venous phase images, nonspecific. Correlate with prostate physical exam and PSA level in determining whether further workup is warranted. 4. Mild cardiomegaly. 5. 2 mm left lower lobe pulmonary nodule, no change from 10/11/2014.   Electronically Signed   By: Van Clines M.D.   On: 10/17/2014 11:39   Ct Foot Right Wo Contrast  10/16/2014   CLINICAL DATA:  Chronic osteomyelitis of the right foot. Lytic lesion along the second metatarsal.  EXAM: CT OF THE RIGHT FOOT WITHOUT CONTRAST  TECHNIQUE: Multidetector CT imaging of the right foot was performed according to the standard protocol. Multiplanar CT image reconstructions were also generated.  COMPARISON:  10/14/2014  FINDINGS: Erosions, some with overhanging edges and cortical irregularity, are observed along the distal fibula; distal tibia; medial talus proximally; the CT if the calcaneocuboid articulation; the distal navicular; along much of the Lisfranc joint; and along the first metatarsophalangeal joint. Some of these erosions are along the intermetatarsal bursal region and not directly along the joint itself, including the larger erosion along the base of the second metatarsal which measures proximally 1.6 by 1.0 cm on image 21 of series 605. I do not see  a bony sequestrum within this erosion although there is some lucency in the marginal bone as shown on image 22 series 605. There are some spiculations bone in some of the other erosions, such as in the medial malleolar erosion on image 30 of series 605.  There is diffuse subcutaneous and cutaneous edema along the ankle and foot, most severely laterally were there is skin irregularity, wrinkling, and quite possibly ulceration -correlation with physical inspection suggested. Small bony fragments are present below the medial and lateral malleolus could. Highly attenuated distal tibialis posterior tendon, distal to the accessory navicular. Atrophic plantar musculature in the foot. Small amount of gas in the subcutaneous tissues along the dorsal lateral foot, image 66 series 606.  Plantar and Achilles calcaneal spurs are somewhat fragmented and irregular.  IMPRESSION: 1. Suspected cellulitis and probable lateral ulceration along the foot, with a tiny amount of gas in the subcutaneous tissues. 2. Large marginal erosions with overhanging edges throughout much of the hindfoot and midfoot and also at the first metatarsophalangeal joint. The largest erosive lesion is at the second metatarsal base. Given the numerous areas involvement, gout  is considered of fairly high probability. Chronic multifocal osteomyelitis seems less likely. That said, there is likely soft tissue infection laterally. MRI can sometimes be useful in differentiating gout from osteomyelitis based on the degree of associated edema, but I do note that the patient has a pacemaker. Needle aspiration of the second toe cavity may be warranted. It might also be helpful to image the contralateral foot to look for signs of gout.   Electronically Signed   By: Van Clines M.D.   On: 10/16/2014 08:34   Dg Chest Port 1 View  10/15/2014   CLINICAL DATA:  Atrial flutter; hypertension  EXAM: PORTABLE CHEST - 1 VIEW  COMPARISON:  October 28, 2013  FINDINGS:  Pacemaker leads are attached to the right atrium, right ventricle, and left ventricle, stable. No pneumothorax. There is cardiomegaly with mild pulmonary venous hypertension. No edema or consolidation. No effusions. No adenopathy.  IMPRESSION: Suspect a degree of chronic volume overload. Pacemaker leads unchanged. No pneumothorax.   Electronically Signed   By: Lowella Grip III M.D.   On: 10/15/2014 07:26   Dg Foot Complete Right  10/14/2014   CLINICAL DATA:  Lateral foot wound for 1 month  EXAM: RIGHT FOOT COMPLETE - 3+ VIEW  COMPARISON:  None.  FINDINGS: No acute fracture or dislocation is noted. Increased cortical thickening is noted within the fifth metatarsal as well as areas of lucency in the bases of the second through fourth metatarsals. The known lateral wound is seen. These changes are highly suspicious for underlying osteomyelitis. Calcaneal spurs are seen.  IMPRESSION: Soft tissue wound with associated bony changes suspicious for osteomyelitis. MRI may be helpful for further evaluation.   Electronically Signed   By: Inez Catalina M.D.   On: 10/14/2014 18:23      ASSESSMENT: Kathyrn Lass:    Nonischemic cardiomyopathy: Compensated. No evidence of volume overload at this time.   AFib: Holding Pradaxa due to hematuria and recent surgery.   Restart Pradaxa for stroke prevention when safe from a bleeding standpoint.  Of note, Pradaxa will be therapeutic very soon after swallowing the pill, unlike Coumadin which takes several days to become therapeutic.    Spoke to Dr. Jeffie Pollock.  He wanted to wait a week postoperatively before restarting Pradaxa.  OK to d/c from a cardiac standpoint.  Resume home meds.  Cardiology F/u with Dr. Bronson Ing in [place].    Jettie Booze, MD  10/18/2014  7:26 AM

## 2014-10-18 NOTE — Discharge Summary (Signed)
Physician Discharge Summary  [name] EXN:[id number] DOB: [birth year] DOA: 10/14/2014  PCP: [name], MD  Admit date: 10/14/2014 Discharge date: 10/18/2014  Recommendations for Outpatient Follow-up:  1. Please note that follow-up appointment has been scheduled for 10/25/2014 at 2 PM with PCP 2. Please obtain BMP to evaluate electrolytes and kidney function 3. Please also check CBC to evaluate Hg and Hct levels 4. Stop taking Pradaxa for 1 week if hemoglobin stable and no further signs of blood in urine or stool 5. Please also note the changes in diabetic medical regimen as patient has maintained good control of CBG while in hospital off of oral antihyperglycemics 6. Changes in medical regimen of diabetes have been discussed with patient in detail: Amaryl discontinued, metformin discontinued due to diarrhea, dose of Levemir readjusted from 100 units to 50 units twice a day until oral intake improves, insulin NovoLog discontinued as well to avoid hypoglycemic events 7. Patient advised to continue taking Levemir as noted above 50 units twice a day, Invokana, and Januvia 8. Patient also discharged on Keflex 500 mg every 6 hours for 5 more days to complete therapy for MSSA UTI 9. Patient made aware to schedule an appointment with Dr. Sharol Given for follow-up on bilateral foot wounds 10. Patient made aware that urologist office will call him for an appointment for further follow-up  Discharge Diagnoses:  Active Problems:   LBBB (left bundle branch block)   Hematuria   Bladder tumor   Cellulitis   Atrial fibrillation with RVR   Non-ischemic cardiomyopathy   Bladder cancer  Discharge Condition: Stable  Diet recommendation: Heart healthy diet discussed in details    Brief narrative:    54 yo male with HTN, CAD, morbid obesity, atrial flutter, DM type II, on pradaxa as outpt for a fib presented to [facility] hospital with painless hematuria. He described this as frank blood. He was seen  by urology (Dr. Exie Parody) at [name] and started on bladder irrigation. Cystoscopy showed posterior wall bladder tumor. It was recommended that he be tx to [facility] to have further cardiac assessment prior to having additional urology intervention. He had urine culture which showed Staph aureus, MSSA.  - While at [facility] he developed RVR from his A fib and started on cardizem gtt. - He has wounds on both his feet, Rt > Lt, have been present for at least 1 month, never sought attention for these. - has past surgical history that includes Bi-V implantable cardioverter defib (10/27/2013); A- flutter ablation (03/04/2012)  SIGNIFICANT EVENTS  2/25 Admit to [facility] 2/28 Transfer to [name]  Assessment/Plan:    Hematuria with bladder tumor noted at [name]. - Urology was consulted, appreciate input, will continue to follow up on recommendations  - s/p cystoscopy and evacuation of blood clots, transurethral resection of bladder tumor 3/2 by Dr. Jeffie Pollock  - will need outpatient follow up and urology office will call pt with appointment details   Stage 2 CKD - Creatinine stable, WNL   Leukocytosis - from UTI, ? Contribution for Right foot cellulitis - continuing Keflex upon discharge for MSSA UTI - WBC is now WNL   Chronic systolic heart failure  - cardiology consulted preop, appreciate input - Most recent 2-D echo was done in September 2015 and showed an ejection fraction of 35%  - currently he is euvolemic - Continue Avapro, beta blocker  A. fib with RVR - Continue amiodarone, digoxin, metoprolol - Pradaxa now on hold for one week post discharge per Dr. Jeffie Pollock recommendations  -  will be able to resume in one week if Hg/hct remain stable   Hyperlipidemia  - continue Lopid and Lipitor  Hypertension  - continue clonidine and other medications as above  B/L foot wounds. - Dr. Sharol Given consulted and evaluated patient while hospitalized - Patient underwent CT scan of the right foot which  showed changes consistent with chronic gout, and will follow-up as an outpatient Dr. Sharol Given  UTI with MSSA on urine cx from [name] 2/25. - f/u blood cultures [place] from 2/25 MSSA - continue Keflex as noted above   DM type II with renal complications. - please note the changes in diabetic regimen   29mm nodule Lt lower lung on CT abd/pelvis 10/11/14 - outpatient follow up   Code Status: Full.  Family Communication: plan of care discussed with the patient, mother over the phone  Disposition Plan: Home when stable.   IV access:  Peripheral IV  Procedures and diagnostic studies:   Ct Abdomen Pelvis W Wo Contrast 10/17/2014 Mild wall thickening in the urinary bladder along the left posterior wall, potentially representing bladder tumor or site of resection. 2. Several hypodense renal lesions are technically too small to characterize although statistically likely to be cysts. 3. 1.3 cm focus of hypoenhancement in the right prostate gland on portal venous phase images, nonspecific. Correlate with prostate physical exam and PSA level in determining whether further workup is warranted. 4. Mild cardiomegaly. 5. 2 mm left lower lobe pulmonary nodule, no change from 10/11/2014.   Ct Foot Right Wo Contrast 10/16/2014 Suspected cellulitis and probable lateral ulceration along the foot, with a tiny amount of gas in the subcutaneous tissues. 2. Large marginal erosions with overhanging edges throughout much of the hindfoot and midfoot and also at the first metatarsophalangeal joint. The largest erosive lesion is at the second metatarsal base. Given the numerous areas involvement, gout is considered of fairly high probability. Chronic multifocal osteomyelitis seems less likely. That said, there is likely soft tissue infection laterally. MRI can sometimes be useful in differentiating gout from osteomyelitis based on the degree of associated edema, but I do note that the patient has a pacemaker.  Needle aspiration of the second toe cavity may be warranted. It might also be helpful to image the contralateral foot to look for signs of gout. Electronically Signed By: Van Clines M.D. On: 10/16/2014 08:34   Dg Chest Port 1 Noni Saupe 10/15/2014 Suspect a degree of chronic volume overload. Pacemaker leads unchanged. No pneumothorax.   Dg Foot Complete Right 10/14/2014 Soft tissue wound with associated bony changes suspicious for osteomyelitis. MRI may be helpful for further evaluation.   Medical Consultants:  Urology Cardiology PCCM  Other Consultants:  None  IAnti-Infectives:   Ancef --> changed to Keflex upon discharge       Discharge Exam: Filed Vitals:   10/18/14 1039  BP: 120/82  Pulse: 90  Temp:   Resp:    Filed Vitals:   10/17/14 1305 10/17/14 2232 10/18/14 0443 10/18/14 1039  BP: 114/72 108/74 117/85 120/82  Pulse: 105 87 84 90  Temp: 98 F (36.7 C) 97.9 F (36.6 C) 97.7 F (36.5 C)   TempSrc: Axillary Oral Oral   Resp:  18 18   Height:      Weight:   114.034 kg (251 lb 6.4 oz)   SpO2: 98% 98% 98%     General: Pt is alert, follows commands appropriately, not in acute distress Cardiovascular: Regular rate and rhythm, S1/S2 +, no rubs, no gallops Respiratory: Clear  to auscultation bilaterally, no wheezing, no crackles, no rhonchi Abdominal: Soft, non tender, non distended, bowel sounds +, no guarding Extremities: no cyanosis, pulses palpable bilaterally DP and PT Neuro: Grossly nonfocal  Discharge Instructions  Discharge Instructions    Diet - low sodium heart healthy    Complete by:  As directed      Increase activity slowly    Complete by:  As directed             Medication List    STOP taking these medications        dabigatran 150 MG Caps capsule  Commonly known as:  PRADAXA     glimepiride 2 MG tablet  Commonly known as:  AMARYL     insulin aspart 100 UNIT/ML injection  Commonly known as:  novoLOG      metFORMIN 1000 MG tablet  Commonly known as:  GLUCOPHAGE     metolazone 2.5 MG tablet  Commonly known as:  ZAROXOLYN      TAKE these medications        allopurinol 100 MG tablet  Commonly known as:  ZYLOPRIM  Take 1 tablet (100 mg total) by mouth daily.     amiodarone 200 MG tablet  Commonly known as:  PACERONE  Take 1 tablet (200 mg total) by mouth 2 (two) times daily.     atorvastatin 10 MG tablet  Commonly known as:  LIPITOR  Take 10 mg by mouth daily.     cephALEXin 500 MG capsule  Commonly known as:  KEFLEX  Take 1 capsule (500 mg total) by mouth 4 (four) times daily.     cloNIDine 0.2 MG tablet  Commonly known as:  CATAPRES  Take 0.2 mg by mouth 2 (two) times daily.     COLCRYS 0.6 MG tablet  Generic drug:  colchicine  Take 0.6 mg by mouth daily as needed (gout).     digoxin 0.25 MG tablet  Commonly known as:  LANOXIN  Take 0.125 mg by mouth daily.     gemfibrozil 600 MG tablet  Commonly known as:  LOPID  Take 600 mg by mouth 2 (two) times daily before a meal.     insulin detemir 100 UNIT/ML injection  Commonly known as:  LEVEMIR  Inject 0.5 mLs (50 Units total) into the skin 2 (two) times daily.     INVOKANA 300 MG Tabs tablet  Generic drug:  canagliflozin  Take 300 mg by mouth daily.     loperamide 2 MG capsule  Commonly known as:  IMODIUM  Take 1 capsule (2 mg total) by mouth as needed for diarrhea or loose stools.     metoprolol 100 MG tablet  Commonly known as:  LOPRESSOR  Take 100 mg by mouth 2 (two) times daily.     prazosin 1 MG capsule  Commonly known as:  MINIPRESS  TAKE ONE CAPSULE BY MOUTH TWICE DAILY     sitaGLIPtin 100 MG tablet  Commonly known as:  JANUVIA  Take 100 mg by mouth daily.     spironolactone 25 MG tablet  Commonly known as:  ALDACTONE  TAKE ONE TABLET BY MOUTH ONCE DAILY     torsemide 20 MG tablet  Commonly known as:  DEMADEX  Take 1 tablet (20 mg total) by mouth 2 (two) times daily.     valsartan 160 MG tablet   Commonly known as:  DIOVAN  Take 160 mg by mouth daily.  Follow-up Information    Follow up with DUDA,MARCUS V, MD In 2 weeks.   Specialty:  Orthopedic Surgery   Contact information:   [place] [address] [phone]       Follow up with Malka So, MD.   Specialty:  Urology   Why:  My office should be calling in the next few days to set up f/u.  If you have not heard by next Tuesday, please call for an appt.    Contact information:   [address] [place] [address] [phone]       Follow up with [facility], MD. Go on 10/25/2014.   Specialty:  Internal Medicine   Why:  At 2 pm for hospital follow-up   Contact information:   [place] [address] [phone]       Follow up with Faye Ramsay, MD.   Specialty:  Internal Medicine   Why:  my cell phone [phone]   Contact information:   [address] [place]  [address] [phone]        The results of significant diagnostics from this hospitalization (including imaging, microbiology, ancillary and laboratory) are listed below for reference.     Microbiology: Recent Results (from the past 240 hour(s))  MRSA PCR Screening     Status: None   Collection Time: 10/14/14  5:56 PM  Result Value Ref Range Status   MRSA by PCR NEGATIVE NEGATIVE Final    Comment:        The GeneXpert MRSA Assay (FDA approved for NASAL specimens only), is one component of a comprehensive MRSA colonization surveillance program. It is not intended to diagnose MRSA infection nor to guide or monitor treatment for MRSA infections.   Clostridium Difficile by PCR     Status: None   Collection Time: 10/15/14  6:04 PM  Result Value Ref Range Status   C difficile by pcr NEGATIVE NEGATIVE Final    Comment: Performed at [facility]: Basic Metabolic Panel:  Recent Labs Lab 10/15/14 0413 10/16/14 0345 10/17/14 0505 10/18/14 0542   NA 141 139 138 134*  K 4.8 4.5 4.2 4.4  CL 112 109 109 104  CO2 21 23 24 19   GLUCOSE 237* 202* 159* 199*  BUN 62* 51* 37* 34*  CREATININE 1.50* 1.34 1.19 1.31  CALCIUM 8.1* 8.1* 8.0* 7.8*  MG 3.0* 2.8*  --   --   PHOS 3.3 3.1  --   --    Liver Function Tests:  Recent Labs Lab 10/15/14 0413  AST 22  ALT 22  ALKPHOS 166*  BILITOT 1.0  PROT 6.3  ALBUMIN 2.4*   CBC:  Recent Labs Lab 10/15/14 0413 10/16/14 0345 10/17/14 0500 10/18/14 0542  WBC 12.2* 13.5* 12.0* 10.2  NEUTROABS  --   --  9.7*  --   HGB 10.2* 10.2* 10.2* 10.3*  HCT 32.8* 33.2* 32.9* 33.1*  MCV 86.3 87.4 86.1 85.3  PLT 294 318 294 271    BNP (last 3 results)  Recent Labs  10/15/14 1608  BNP 751.7*    CBG:  Recent Labs Lab 10/17/14 1644 10/17/14 2204 10/18/14 0016 10/18/14 0428 10/18/14 0725  GLUCAP 236* 153* 180* 192* 178*     SIGNED: Time coordinating discharge: Over 30 minutes  Faye Ramsay, MD  [facility] 10/18/2014, 10:54 AM Pager [phone]  If 7PM-7AM, please contact night-coverage [email or web address] Password [email or web address]

## 2014-10-18 NOTE — Progress Notes (Signed)
Patient ID: [name], male   DOB: [birth year], 54 y.o.   MRN: [id number] 2 Days Post-Op  Subjective: [name] is doing well today and is voiding clear urine without complaints.   His path just showed necrotic tissue with no viable tumor.   His repeat PVR yesterday was only 67ml. ROS:  Review of Systems  Constitutional: Negative for fever.  Respiratory: Negative for shortness of breath.   Cardiovascular: Negative for chest pain.  Gastrointestinal: Negative for abdominal pain.    Anti-infectives: Anti-infectives    Start     Dose/Rate Route Frequency Ordered Stop   10/16/14 1800  ceFAZolin (ANCEF) IVPB 2 g/50 mL premix     2 g 100 mL/hr over 30 Minutes Intravenous Every 8 hours 10/16/14 1512     10/14/14 1900  [MAR Hold]  piperacillin-tazobactam (ZOSYN) IVPB 3.375 g  Status:  Discontinued     (MAR Hold since 10/16/14 1352)   3.375 g 12.5 mL/hr over 240 Minutes Intravenous Every 8 hours 10/14/14 1829 10/16/14 1512      Current Facility-Administered Medications  Medication Dose Route Frequency Provider Last Rate Last Dose  . acetaminophen (TYLENOL) tablet 650 mg  650 mg Oral Q6H PRN Chesley Mires, MD      . allopurinol (ZYLOPRIM) tablet 100 mg  100 mg Oral Daily Chesley Mires, MD   100 mg at 10/17/14 1255  . amiodarone (PACERONE) tablet 200 mg  200 mg Oral BID Chesley Mires, MD   200 mg at 10/17/14 2131  . antiseptic oral rinse (CPC / CETYLPYRIDINIUM CHLORIDE 0.05%) solution 7 mL  7 mL Mouth Rinse q12n4p Chesley Mires, MD   7 mL at 10/17/14 1806  . atorvastatin (LIPITOR) tablet 10 mg  10 mg Oral q1800 Chesley Mires, MD   10 mg at 10/17/14 1800  . ceFAZolin (ANCEF) IVPB 2 g/50 mL premix  2 g Intravenous Q8H Costin Karlyne Greenspan, MD   2 g at 10/18/14 0245  . chlorhexidine (PERIDEX) 0.12 % solution 15 mL  15 mL Mouth Rinse BID Chesley Mires, MD   15 mL at 10/17/14 2131  . cloNIDine (CATAPRES) tablet 0.2 mg  0.2 mg Oral BID Chesley Mires, MD   0.2 mg at 10/17/14 2131  . collagenase (SANTYL) ointment    Topical Daily Meridee Score V, MD      . digoxin (LANOXIN) tablet 0.125 mg  0.125 mg Oral Daily Chesley Mires, MD   0.125 mg at 10/17/14 1254  . gemfibrozil (LOPID) tablet 600 mg  600 mg Oral BID AC Chesley Mires, MD   600 mg at 10/17/14 1800  . insulin aspart (novoLOG) injection 0-15 Units  0-15 Units Subcutaneous 6 times per day Peyton Najjar, MD   3 Units at 10/17/14 2200  . insulin aspart (novoLOG) injection 0-5 Units  0-5 Units Subcutaneous QHS Chesley Mires, MD   3 Units at 10/16/14 0003  . irbesartan (AVAPRO) tablet 150 mg  150 mg Oral Daily Chesley Mires, MD   150 mg at 10/17/14 1253  . metoprolol tartrate (LOPRESSOR) tablet 100 mg  100 mg Oral BID Chesley Mires, MD   100 mg at 10/17/14 2131  . pneumococcal 23 valent vaccine (PNU-IMMUNE) injection 0.5 mL  0.5 mL Intramuscular Tomorrow-1000 Costin Karlyne Greenspan, MD      . prazosin (MINIPRESS) capsule 1 mg  1 mg Oral BID Chesley Mires, MD   1 mg at 10/17/14 2131     Objective: Vital signs in last 24 hours: Temp:  [97.7 F (  36.5 C)-98 F (36.7 C)] 97.7 F (36.5 C) (03/03 0443) Pulse Rate:  [84-105] 84 (03/03 0443) Resp:  [18] 18 (03/03 0443) BP: (108-117)/(72-85) 117/85 mmHg (03/03 0443) SpO2:  [98 %] 98 % (03/03 0443) Weight:  [114.034 kg (251 lb 6.4 oz)] 114.034 kg (251 lb 6.4 oz) (03/03 0443)  Intake/Output from previous day: 03/02 0701 - 03/03 0700 In: 700 [P.O.:600; IV Piggyback:100] Out: 1725 [Urine:1725] Intake/Output this shift:     Physical Exam  Constitutional: He is well-developed, well-nourished, and in no distress.  Pulmonary/Chest: Effort normal. No respiratory distress.    Lab Results:   Recent Labs  10/17/14 0500 10/18/14 0542  WBC 12.0* 10.2  HGB 10.2* 10.3*  HCT 32.9* 33.1*  PLT 294 271   BMET  Recent Labs  10/17/14 0505 10/18/14 0542  NA 138 134*  K 4.2 4.4  CL 109 104  CO2 24 19  GLUCOSE 159* 199*  BUN 37* 34*  CREATININE 1.19 1.31  CALCIUM 8.0* 7.8*   PT/INR No results for input(s): LABPROT,  INR in the last 72 hours. ABG No results for input(s): PHART, HCO3 in the last 72 hours.  Invalid input(s): PCO2, PO2  Studies/Results: Ct Abdomen Pelvis W Wo Contrast  10/17/2014   CLINICAL DATA:  Malignant urinary bladder neoplasm. Postop day 1 for cystoscopy and transurethral resection of bladder tumor.  EXAM: CT ABDOMEN AND PELVIS WITHOUT AND WITH CONTRAST  TECHNIQUE: Multidetector CT imaging of the abdomen and pelvis was performed following the standard protocol before and following the bolus administration of intravenous contrast.  CONTRAST:  196mL OMNIPAQUE IOHEXOL 300 MG/ML  SOLN  COMPARISON:  10/11/2014  FINDINGS: Lower chest: 2 mm left lower lobe nodule, image 8 series 6, stable. Mild cardiomegaly.  Hepatobiliary: 10/11/2014  Pancreas: Unremarkable  Spleen: Unremarkable  Adrenals/Urinary Tract: 1.3 cm left kidney upper pole hypodense lesion, subject to volume averaging giving the vertical size, but not appreciably enhancing on multiplanar imaging measurements. Appearance compatible with benign cyst. Several other hypodense lesions of the left kidney and right anterior mid kidney are technically too small to characterize, although statistically likely to be cysts.  Subtle wall thickening along the left posterior urinary bladder wall. No abnormal urothelial enhancement on portal venous phase images. No additional filling defects along the urothelium noted on delayed phase images.  Stomach/Bowel: Unremarkable  Vascular/Lymphatic: Small retroperitoneal lymph nodes are not pathologically enlarged by size criteria. Mild aortoiliac atherosclerosis. Right external iliac node 9 mm in short axis, image 80 series 4.  Reproductive: 1.3 cm focus of hypoenhancement in the right prostate gland, image 95 series 4.  Other: Mild symmetric presacral edema.  Musculoskeletal: Stable stranding along the left anterior abdominal wall subcutaneous tissues, possibly from injection, but technically nonspecific.  IMPRESSION:  1. Mild wall thickening in the urinary bladder along the left posterior wall, potentially representing bladder tumor or site of resection. 2. Several hypodense renal lesions are technically too small to characterize although statistically likely to be cysts. 3. 1.3 cm focus of hypoenhancement in the right prostate gland on portal venous phase images, nonspecific. Correlate with prostate physical exam and PSA level in determining whether further workup is warranted. 4. Mild cardiomegaly. 5. 2 mm left lower lobe pulmonary nodule, no change from 10/11/2014.   Electronically Signed   By: Van Clines M.D.   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   There is some thickening of the bladder wall in the area of the resection that may be postsurgical edema but residual neoplasm is a possibility.   He had no adenopathy or other evidence of mets.  He is voiding well with clear urine.   Plan: I will arrange f/u in the office in about a month for repeat cystoscopy.  He will probably need a repeat biopsy at some point in the next 2-3 months depending on those findings.   He will need to stay off of the anticoagulants for about a week post op.  He is ok for discharge from my standpoint.       LOS: 4 days    Jeray Shugart J 10/18/2014

## 2014-10-19 ENCOUNTER — Telehealth: Payer: Self-pay | Admitting: Cardiology

## 2014-10-19 NOTE — Telephone Encounter (Signed)
Pt enrolled in ICM clinic. 1st transmission 11-19-14.

## 2014-10-26 ENCOUNTER — Encounter: Payer: Self-pay | Admitting: Internal Medicine

## 2014-11-19 ENCOUNTER — Ambulatory Visit (INDEPENDENT_AMBULATORY_CARE_PROVIDER_SITE_OTHER): Payer: Medicare Other | Admitting: *Deleted

## 2014-11-19 DIAGNOSIS — Z95810 Presence of automatic (implantable) cardiac defibrillator: Secondary | ICD-10-CM | POA: Diagnosis not present

## 2014-11-19 DIAGNOSIS — I519 Heart disease, unspecified: Secondary | ICD-10-CM

## 2014-11-21 ENCOUNTER — Encounter: Payer: Self-pay | Admitting: *Deleted

## 2014-11-21 ENCOUNTER — Telehealth: Payer: Self-pay | Admitting: *Deleted

## 2014-11-21 NOTE — Telephone Encounter (Signed)
I spoke with the patient. 

## 2014-11-21 NOTE — Telephone Encounter (Signed)
ICM transmission received. I left a message for the patient to call at his home #.

## 2014-11-21 NOTE — Progress Notes (Signed)
EPIC Encounter for ICM Monitoring  Patient Name: [name] is a 54 y.o. male Date: 11/21/2014 Primary Care Physican: [name], MD Primary Cardiologist: Bronson Ing Electrophysiologist: Allred Dry Weight: 243 lbs      In the past month, have you:  1. Gained more than 2 pounds in a day or more than 5 pounds in a week? No. He states his weight is slowly and steadily decreasing.  2. Had changes in your medications (with verification of current medications)? See below.  3. Had more shortness of breath than is usual for you? no  4. Limited your activity because of shortness of breath? no  5. Not been able to sleep because of shortness of breath? no  6. Had increased swelling in your feet or ankles? no  7. Had symptoms of dehydration (dizziness, dry mouth, increased thirst, decreased urine output) See below.  8. Had changes in sodium restriction? no  9. Been compliant with medication? Yes   ICM trend:   Follow-up plan: ICM clinic phone appointment: 12/24/14. This is my first ICM encounter with the patient. He was hospitalized on 2/28 for hematuria and found to have a bladder tumor. During his hospitalization, he developed a-fib with RVR and was placed on a cardizem gtt. On 10/16/14 he had a transurethral resection of his bladder tumor. His Pradaxa has been on hold since that time. His only real complaint at this time is severe dizziness/ lightheaded/ and some blurred vision since he started taking amiodarone. He was placed on this during his hospitalization from 2/28-3/3. He is currently taking amiodarone 200 mg one tablet daily per his report today. He reports his BP at 110/60, however, he states that a home health nurse checked his BP sitting and standing about a week ago and his BP dropped ~ 30 points. He denies any bleeding at the present time. He is due to follow up on 11/29/14 with Dr. Bronson Ing and 01/25/15 with Dr. [name]. I advised the patient that since his fluid readings look  good today, I will make no changes to his medications. I will forward this report with is symptoms of dizziness, lightheadedness, and blurred vision to his doctors for review.   Copy of note sent to patient's primary care physician, primary cardiologist, and device following physician.  Alvis Lemmings, RN, BSN 11/21/2014 12:14 PM

## 2014-11-28 ENCOUNTER — Other Ambulatory Visit: Payer: Self-pay | Admitting: Cardiovascular Disease

## 2014-11-29 ENCOUNTER — Ambulatory Visit (INDEPENDENT_AMBULATORY_CARE_PROVIDER_SITE_OTHER): Payer: Medicare Other | Admitting: Cardiovascular Disease

## 2014-11-29 ENCOUNTER — Encounter: Payer: Self-pay | Admitting: Cardiovascular Disease

## 2014-11-29 VITALS — BP 108/74 | HR 63 | Ht 72.0 in | Wt 253.0 lb

## 2014-11-29 DIAGNOSIS — N183 Chronic kidney disease, stage 3 unspecified: Secondary | ICD-10-CM

## 2014-11-29 DIAGNOSIS — I482 Chronic atrial fibrillation, unspecified: Secondary | ICD-10-CM

## 2014-11-29 DIAGNOSIS — I447 Left bundle-branch block, unspecified: Secondary | ICD-10-CM | POA: Diagnosis not present

## 2014-11-29 DIAGNOSIS — Z95810 Presence of automatic (implantable) cardiac defibrillator: Secondary | ICD-10-CM

## 2014-11-29 DIAGNOSIS — Z79899 Other long term (current) drug therapy: Secondary | ICD-10-CM

## 2014-11-29 DIAGNOSIS — I519 Heart disease, unspecified: Secondary | ICD-10-CM

## 2014-11-29 DIAGNOSIS — Z7901 Long term (current) use of anticoagulants: Secondary | ICD-10-CM

## 2014-11-29 DIAGNOSIS — Z9289 Personal history of other medical treatment: Secondary | ICD-10-CM

## 2014-11-29 DIAGNOSIS — Z87898 Personal history of other specified conditions: Secondary | ICD-10-CM

## 2014-11-29 DIAGNOSIS — R42 Dizziness and giddiness: Secondary | ICD-10-CM

## 2014-11-29 DIAGNOSIS — I872 Venous insufficiency (chronic) (peripheral): Secondary | ICD-10-CM

## 2014-11-29 MED ORDER — SPIRONOLACTONE 25 MG PO TABS: 12.5000 mg | ORAL_TABLET | Freq: Every day | ORAL | Status: DC

## 2014-11-29 MED ORDER — VALSARTAN 80 MG PO TABS: 80.0000 mg | ORAL_TABLET | Freq: Every day | ORAL | Status: DC

## 2014-11-29 MED ORDER — METOPROLOL TARTRATE 50 MG PO TABS: 75.0000 mg | ORAL_TABLET | Freq: Two times a day (BID) | ORAL | Status: DC

## 2014-11-29 MED ORDER — DABIGATRAN ETEXILATE MESYLATE 75 MG PO CAPS: 75.0000 mg | ORAL_CAPSULE | Freq: Two times a day (BID) | ORAL | Status: DC

## 2014-11-29 NOTE — Patient Instructions (Signed)
Your physician recommends that you schedule a follow-up appointment in: 3 months with Dr.koneswaran   STOP Amiodarone  START Pradaxa 75 mg twice a day     DECREASE Diovan to 80 mg daily  DECREASE Metoprolol to 75 mg twice a day  DECREASE Spironolactone to 12.5 mg daily   Get blood work next week CBC, 12/05/14       Thank you for choosing [facility] !

## 2014-11-29 NOTE — Progress Notes (Signed)
Patient ID: [name], male   DOB: 04-20-1961, 54 y.o.   MRN: [id number]      SUBJECTIVE: The patient returns for follow up for chronic systolic heart failure and has a CRT-D system in place. Echocardiogram performed on 04/24/14 demonstrated moderate to severely reduced left ventricular systolic function, EF 42%, grade 3 diastolic dysfunction and aortic sclerosis but no significant stenosis.  He was hospitalized in March for painless hematuria and was ultimately diagnosed with a posterior wall bladder tumor. During that time, he developed rapid atrial fibrillation and was started on amiodarone. After urology consultation, it was recommended that Pradaxa be held until his hemoglobin was stable and he had no blood loss from the genitourinary or GI tract.  He complains of dizziness, lightheadedness, and some blurred vision since starting amiodarone. When he stands up he also gets lightheaded and his blood pressures have remained low. He has not had any more hematuria or bleeding from the gastrointestinal tract. He denies chest pain and shortness of breath. He is being treated for a diabetic foot ulcer on the right foot at the wound center at [facility].  Device interrogation on 10/01/14 demonstrated no ventricular arrhythmias, with biventricular pacing greater than 98.3% of the time.   Review of Systems: As per "subjective", otherwise negative.  No Known Allergies  Current Outpatient Prescriptions  Medication Sig Dispense Refill  . allopurinol (ZYLOPRIM) 100 MG tablet Take 1 tablet (100 mg total) by mouth daily. 30 tablet 1  . amiodarone (PACERONE) 200 MG tablet Take 1 tablet (200 mg total) by mouth 2 (two) times daily. (Patient taking differently: Take 200 mg by mouth daily. ) 60 tablet 1  . atorvastatin (LIPITOR) 10 MG tablet Take 10 mg by mouth daily.      . Canagliflozin (INVOKANA) 300 MG TABS Take 300 mg by mouth daily.    . cloNIDine (CATAPRES) 0.2 MG tablet Take 0.2 mg by mouth 2 (two)  times daily.      [name] COLCRYS 0.6 MG tablet Take 0.6 mg by mouth daily as needed (gout).     [name] digoxin (LANOXIN) 0.25 MG tablet Take 0.125 mg by mouth daily.    [name] gemfibrozil (LOPID) 600 MG tablet Take 600 mg by mouth 2 (two) times daily before a meal.      . insulin detemir (LEVEMIR) 100 UNIT/ML injection Inject 0.5 mLs (50 Units total) into the skin 2 (two) times daily. 10 mL 1  . loperamide (IMODIUM) 2 MG capsule Take 1 capsule (2 mg total) by mouth as needed for diarrhea or loose stools. 30 capsule 0  . metoprolol (LOPRESSOR) 100 MG tablet Take 100 mg by mouth 2 (two) times daily.      . prazosin (MINIPRESS) 1 MG capsule TAKE ONE CAPSULE BY MOUTH TWICE DAILY 60 capsule 6  . sitaGLIPtin (JANUVIA) 100 MG tablet Take 100 mg by mouth daily.    [name] spironolactone (ALDACTONE) 25 MG tablet TAKE ONE TABLET BY MOUTH ONCE DAILY 30 tablet 3  . torsemide (DEMADEX) 20 MG tablet Take 1 tablet (20 mg total) by mouth 2 (two) times daily. 60 tablet 6  . valsartan (DIOVAN) 160 MG tablet Take 160 mg by mouth daily.     No current facility-administered medications for this visit.    Past Medical History  Diagnosis Date  . Hypertension   . Morbid obesity   . Atrial flutter  10/01/2006, 12/30/11    Status post RF ablation, 7/13, Dr. [name]  . OSA (obstructive sleep apnea)  compliant with CPAP  . Nonischemic cardiomyopathy     Normal coronary arteries, 2008  . Chronic systolic dysfunction of left ventricle     EF 20-25%, global HK; mild RVD, 2-D echo, 5/13  . Unspecified disorder of kidney and ureter   . RBBB, anterior fascicular block and incomplete LBBB   . Pulmonary hypertension     RVSP 50-55 mmHg, 5/13  . Valvular heart disease     Mild MR/TR, 2-D echo, 5/13  . Type II or unspecified type diabetes mellitus without mention of complication, not stated as uncontrolled     Past Surgical History  Procedure Laterality Date  . Bi-ventricular implantable cardioverter defibrillator  (crt-d)  10/27/2013      MDT [name] XT CRTD implanted by [name] for NICM, CHF  . Atrial flutter ablation N/A 03/04/2012    Procedure: ATRIAL FLUTTER ABLATION;  Surgeon: Thompson Grayer, MD;  Location: [facility] CATH LAB;  Service: Cardiovascular;  Laterality: N/A;  . Bi-ventricular implantable cardioverter defibrillator N/A 10/27/2013    Procedure: BI-VENTRICULAR IMPLANTABLE CARDIOVERTER DEFIBRILLATOR  (CRT-D);  Surgeon: Coralyn Mark, MD;  Location: [facility] CATH LAB;  Service: Cardiovascular;  Laterality: N/A;  . Cystoscopy/retrograde/ureteroscopy Bilateral 10/16/2014    Procedure: Kathrene Alu WITH BILATERAL Freddie Breech WITH GYRUS;  Surgeon: Malka So, MD;  Location: [facility] ORS;  Service: Urology;  Laterality: Bilateral;  . Transurethral resection of bladder tumor with gyrus (turbt-gyrus) N/A 10/16/2014    Procedure: TRANSURETHRAL RESECTION OF BLADDER TUMOR WITH GYRUS (TURBT-GYRUS);  Surgeon: Malka So, MD;  Location: [facility] ORS;  Service: Urology;  Laterality: N/A;    History   Social History  . Marital Status: Married    Spouse Name: N/A  . Number of Children: N/A  . Years of Education: N/A   Occupational History  . DISABLED    Social History Main Topics  . Smoking status: Never Smoker   . Smokeless tobacco: Never Used  . Alcohol Use: No  . Drug Use: No  . Sexual Activity: Not on file   Other Topics Concern  . Not on file   Social History Narrative   Pt lives in [place] alone.  Disabled.  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     Filed Vitals:   11/29/14 1055  BP: 108/74  Pulse: 63  Height: 6' (1.829 m)  Weight: 253 lb (114.76 kg)  SpO2: 96%    PHYSICAL EXAM General: NAD HEENT: Normal. Neck: No JVD, no thyromegaly. Lungs: Clear to auscultation bilaterally with normal respiratory effort. CV: Nondisplaced PMI.  Regular rate and rhythm, normal S1/S2, no H0/W2, 1/6 systolic murmur along left sternal border and over RUSB. Right foot cast. Abdomen: Soft, nontender, obese, no distention.   Neurologic: Alert and oriented x 3.  Psych: Normal affect. Skin: Normal. Musculoskeletal: Right foot cast.   ECG: Most recent ECG reviewed.      ASSESSMENT AND PLAN: 1. Chronic systolic heart failure/nonischemic cardiomyopathy s/p CRTD, EF 35%: Euvolemic. Continue torsemide 20 mg bid. Due to aforementioned symptoms, will reduce metoprolol to 75 mg bid, spironolactone to 12.5 mg daily, and valsartan to 80 mg daily.  2. Essential HTN: Low normal with dizziness and lightheadedness. Medication changes as per #1.  3. Aortic sclerosis: Echo results noted above with aortic valve sclerosis without stenosis.   4. Atrial fibrillation: HR controlled and CRT-D in place. Will start low-dose Pradaxa 75 mg bid and check CBC next week.  Dispo: f/u 3 months.   Time spent: 40 minutes, of  which greater than 50% was spent reviewing symptoms, relevant blood tests and studies, and discussing management plan with the patient.   Kate Sable, M.D., F.A.C.C.

## 2014-12-05 LAB — CBC
HCT: 38.9 % — ABNORMAL LOW (ref 39.0–52.0)
Hemoglobin: 13.2 g/dL (ref 13.0–17.0)
MCH: 28.6 pg (ref 26.0–34.0)
MCHC: 33.9 g/dL (ref 30.0–36.0)
MCV: 84.2 fL (ref 78.0–100.0)
MPV: 10.5 fL (ref 8.6–12.4)
Platelets: 239 10*3/uL (ref 150–400)
RBC: 4.62 MIL/uL (ref 4.22–5.81)
RDW: 17.7 % — AB (ref 11.5–15.5)
WBC: 7.9 10*3/uL (ref 4.0–10.5)

## 2014-12-10 ENCOUNTER — Other Ambulatory Visit: Payer: Self-pay | Admitting: Cardiovascular Disease

## 2014-12-24 ENCOUNTER — Ambulatory Visit (INDEPENDENT_AMBULATORY_CARE_PROVIDER_SITE_OTHER): Payer: Medicare Other | Admitting: *Deleted

## 2014-12-24 DIAGNOSIS — I519 Heart disease, unspecified: Secondary | ICD-10-CM

## 2014-12-24 DIAGNOSIS — Z95810 Presence of automatic (implantable) cardiac defibrillator: Secondary | ICD-10-CM | POA: Diagnosis not present

## 2014-12-26 ENCOUNTER — Telehealth: Payer: Self-pay | Admitting: *Deleted

## 2014-12-26 ENCOUNTER — Encounter: Payer: Self-pay | Admitting: *Deleted

## 2014-12-26 NOTE — Progress Notes (Signed)
EPIC Encounter for ICM Monitoring  Patient Name: [name] is a 53 y.o. male Date: 12/26/2014 Primary Care Physican: [name], MD Primary Cardiologist: Bronson Ing Electrophysiologist: Allred Dry Weight: 243 lbs       In the past month, have you:  1. Gained more than 2 pounds in a day or more than 5 pounds in a week? no  2. Had changes in your medications (with verification of current medications)? Yes. The patient was having severe dizziness on amiodarone. He was also hypotensive. He saw Dr. Bronson Ing on 11/29/14 and his amiodarone was stopped, metoprolol was decreased to 75 mg BID, spironolactone was decreased to 12.5 mg daily, valsartan was decreased to 80 mg daily, and he was restarted on Pradaxa at 75 mg BID. He reports that his dizziness has improved significantly.   3. Had more shortness of breath than is usual for you? no  4. Limited your activity because of shortness of breath? no  5. Not been able to sleep because of shortness of breath? no  6. Had increased swelling in your feet or ankles? no  7. Had symptoms of dehydration (dizziness, dry mouth, increased thirst, decreased urine output) no  8. Had changes in sodium restriction? no  9. Been compliant with medication? Yes   ICM trend:   Follow-up plan: ICM clinic phone appointment : 02/25/15. No changes made today. He is due to follow up on 01/25/15 with Dr. [name].  Copy of note sent to patient's primary care physician, primary cardiologist, and device following physician.  Alvis Lemmings, RN, BSN 12/26/2014 11:48 AM

## 2014-12-26 NOTE — Telephone Encounter (Signed)
ICM transmission received. I left a message for the patient to call at his home and cell #'s. 

## 2014-12-26 NOTE — Telephone Encounter (Signed)
I spoke with the patient. 

## 2015-01-08 ENCOUNTER — Other Ambulatory Visit: Payer: Self-pay | Admitting: Urology

## 2015-01-08 ENCOUNTER — Other Ambulatory Visit: Payer: Self-pay | Admitting: Cardiovascular Disease

## 2015-01-08 DIAGNOSIS — N3289 Other specified disorders of bladder: Secondary | ICD-10-CM

## 2015-01-08 MED ORDER — TORSEMIDE 20 MG PO TABS: 20.0000 mg | ORAL_TABLET | Freq: Two times a day (BID) | ORAL | Status: DC

## 2015-01-15 ENCOUNTER — Ambulatory Visit (HOSPITAL_COMMUNITY)
Admission: RE | Admit: 2015-01-15 | Discharge: 2015-01-15 | Disposition: A | Discharge status: Home / Self Care | Payer: Medicare Other | Source: Ambulatory Visit | Attending: Urology | Admitting: Urology

## 2015-01-15 ENCOUNTER — Encounter (HOSPITAL_COMMUNITY): Payer: Self-pay

## 2015-01-15 DIAGNOSIS — Z9889 Other specified postprocedural states: Secondary | ICD-10-CM | POA: Diagnosis not present

## 2015-01-15 DIAGNOSIS — Z09 Encounter for follow-up examination after completed treatment for conditions other than malignant neoplasm: Secondary | ICD-10-CM | POA: Diagnosis present

## 2015-01-15 DIAGNOSIS — N3289 Other specified disorders of bladder: Secondary | ICD-10-CM | POA: Diagnosis not present

## 2015-01-15 LAB — POCT I-STAT CREATININE: Creatinine, Ser: 1 mg/dL (ref 0.61–1.24)

## 2015-01-15 MED ORDER — IOHEXOL 300 MG/ML  SOLN
100.0000 mL | Freq: Once | INTRAMUSCULAR | Status: AC | PRN
  Administered 2015-01-15: 100 mL via INTRAVENOUS

## 2015-01-25 ENCOUNTER — Encounter: Payer: Self-pay | Admitting: Internal Medicine

## 2015-01-25 ENCOUNTER — Ambulatory Visit (INDEPENDENT_AMBULATORY_CARE_PROVIDER_SITE_OTHER): Payer: Medicare Other | Admitting: Internal Medicine

## 2015-01-25 VITALS — BP 100/78 | HR 60 | Ht 71.0 in | Wt 270.0 lb

## 2015-01-25 DIAGNOSIS — I429 Cardiomyopathy, unspecified: Secondary | ICD-10-CM

## 2015-01-25 DIAGNOSIS — I519 Heart disease, unspecified: Secondary | ICD-10-CM

## 2015-01-25 DIAGNOSIS — I5023 Acute on chronic systolic (congestive) heart failure: Secondary | ICD-10-CM | POA: Diagnosis not present

## 2015-01-25 DIAGNOSIS — I4891 Unspecified atrial fibrillation: Secondary | ICD-10-CM

## 2015-01-25 DIAGNOSIS — I428 Other cardiomyopathies: Secondary | ICD-10-CM

## 2015-01-25 DIAGNOSIS — Z7901 Long term (current) use of anticoagulants: Secondary | ICD-10-CM

## 2015-01-25 LAB — CUP PACEART INCLINIC DEVICE CHECK
Brady Statistic AP VP Percent: 17.92 %
Brady Statistic AP VS Percent: 0.07 %
Brady Statistic AS VP Percent: 79.43 %
Brady Statistic AS VS Percent: 2.58 %
Brady Statistic RV Percent Paced: 96.79 %
Date Time Interrogation Session: 20160610094918
HIGH POWER IMPEDANCE MEASURED VALUE: 228 Ohm
HIGH POWER IMPEDANCE MEASURED VALUE: 66 Ohm
Lead Channel Impedance Value: 399 Ohm
Lead Channel Pacing Threshold Pulse Width: 0.4 ms
Lead Channel Setting Pacing Pulse Width: 0.4 ms
Lead Channel Setting Sensing Sensitivity: 0.3 mV
MDC IDC MSMT BATTERY REMAINING LONGEVITY: 87 mo
MDC IDC MSMT BATTERY VOLTAGE: 2.99 V
MDC IDC MSMT LEADCHNL LV PACING THRESHOLD AMPLITUDE: 0.5 V
MDC IDC MSMT LEADCHNL LV PACING THRESHOLD PULSEWIDTH: 0.4 ms
MDC IDC MSMT LEADCHNL RA IMPEDANCE VALUE: 418 Ohm
MDC IDC MSMT LEADCHNL RA PACING THRESHOLD AMPLITUDE: 1 V
MDC IDC MSMT LEADCHNL RA PACING THRESHOLD PULSEWIDTH: 0.4 ms
MDC IDC MSMT LEADCHNL RA SENSING INTR AMPL: 3.375 mV
MDC IDC MSMT LEADCHNL RV PACING THRESHOLD AMPLITUDE: 1 V
MDC IDC MSMT LEADCHNL RV SENSING INTR AMPL: 9.25 mV
MDC IDC SET LEADCHNL LV PACING AMPLITUDE: 1.75 V
MDC IDC SET LEADCHNL LV PACING PULSEWIDTH: 0.4 ms
MDC IDC SET LEADCHNL RA PACING AMPLITUDE: 2 V
MDC IDC SET LEADCHNL RV PACING AMPLITUDE: 2.5 V
MDC IDC SET ZONE DETECTION INTERVAL: 300 ms
MDC IDC SET ZONE DETECTION INTERVAL: 350 ms
MDC IDC SET ZONE DETECTION INTERVAL: 360 ms
MDC IDC STAT BRADY RA PERCENT PACED: 17.99 %
Zone Setting Detection Interval: 400 ms

## 2015-01-25 MED ORDER — TORSEMIDE 20 MG PO TABS: 20.0000 mg | ORAL_TABLET | ORAL | Status: DC

## 2015-01-25 NOTE — Progress Notes (Signed)
Electrophysiology Office Note   Date:  01/25/2015   ID:  [name], DOB 02/28/1961, MRN [id number]  PCP:  [name], MD  Cardiologist:  [name] Primary Electrophysiologist: Thompson Grayer, MD    Chief Complaint  Patient presents with  . Edema     History of Present Illness: [name] is a 53 y.o. male who presents today for electrophysiology evaluation.   Pt with chronic difficulty with ble edema.  Also has SOB.  Reports recent weight gain and optivol is elevated today. Today, he denies symptoms of palpitations, chest pain  orthopnea, PND, claudication, dizziness, presyncope, syncope, bleeding, or neurologic sequela. The patient is tolerating medications without difficulties and is otherwise without complaint today.    Past Medical History  Diagnosis Date  . Hypertension   . Morbid obesity   . Atrial flutter  10/01/2006, 12/30/11    Status post RF ablation, 7/13, Dr. [name]  . OSA (obstructive sleep apnea)     compliant with CPAP  . Nonischemic cardiomyopathy     Normal coronary arteries, 2008  . Chronic systolic dysfunction of left ventricle     EF 20-25%, global HK; mild RVD, 2-D echo, 5/13  . Unspecified disorder of kidney and ureter   . RBBB, anterior fascicular block and incomplete LBBB   . Pulmonary hypertension     RVSP 50-55 mmHg, 5/13  . Valvular heart disease     Mild MR/TR, 2-D echo, 5/13  . Type II or unspecified type diabetes mellitus without mention of complication, not stated as uncontrolled    Past Surgical History  Procedure Laterality Date  . Bi-ventricular implantable cardioverter defibrillator  (crt-d)  10/27/2013    MDT [name] XT CRTD implanted by [name] for NICM, CHF  . Atrial flutter ablation N/A 03/04/2012    Procedure: ATRIAL FLUTTER ABLATION;  Surgeon: Thompson Grayer, MD;  Location: [facility] CATH LAB;  Service: Cardiovascular;  Laterality: N/A;  . Bi-ventricular implantable cardioverter defibrillator N/A 10/27/2013    Procedure:  BI-VENTRICULAR IMPLANTABLE CARDIOVERTER DEFIBRILLATOR  (CRT-D);  Surgeon: Coralyn Mark, MD;  Location: [facility] CATH LAB;  Service: Cardiovascular;  Laterality: N/A;  . Cystoscopy/retrograde/ureteroscopy Bilateral 10/16/2014    Procedure: Kathrene Alu WITH BILATERAL Freddie Breech WITH GYRUS;  Surgeon: Malka So, MD;  Location: [facility] ORS;  Service: Urology;  Laterality: Bilateral;  . Transurethral resection of bladder tumor with gyrus (turbt-gyrus) N/A 10/16/2014    Procedure: TRANSURETHRAL RESECTION OF BLADDER TUMOR WITH GYRUS (TURBT-GYRUS);  Surgeon: Malka So, MD;  Location: [facility] ORS;  Service: Urology;  Laterality: N/A;     Current Outpatient Prescriptions  Medication Sig Dispense Refill  . allopurinol (ZYLOPRIM) 100 MG tablet Take 1 tablet (100 mg total) by mouth daily. 30 tablet 1  . atorvastatin (LIPITOR) 10 MG tablet Take 10 mg by mouth daily.      . Canagliflozin (INVOKANA) 300 MG TABS Take 300 mg by mouth daily.    . cloNIDine (CATAPRES) 0.2 MG tablet Take 0.2 mg by mouth 2 (two) times daily.      [name] COLCRYS 0.6 MG tablet Take 0.6 mg by mouth daily as needed (gout).     . dabigatran (PRADAXA) 75 MG CAPS capsule Take 1 capsule (75 mg total) by mouth 2 (two) times daily. 60 capsule 6  . digoxin (LANOXIN) 0.25 MG tablet Take 0.125 mg by mouth daily.    [name] gemfibrozil (LOPID) 600 MG tablet Take 600 mg by mouth 2 (two) times daily before a meal.      .  insulin detemir (LEVEMIR) 100 UNIT/ML injection Inject 0.5 mLs (50 Units total) into the skin 2 (two) times daily. 10 mL 1  . loperamide (IMODIUM) 2 MG capsule Take 1 capsule (2 mg total) by mouth as needed for diarrhea or loose stools. 30 capsule 0  . metoprolol (LOPRESSOR) 50 MG tablet Take 1.5 tablets (75 mg total) by mouth 2 (two) times daily. 180 tablet 3  . prazosin (MINIPRESS) 1 MG capsule TAKE ONE CAPSULE BY MOUTH TWICE DAILY 60 capsule 6  . sitaGLIPtin (JANUVIA) 100 MG tablet Take 100 mg by mouth daily.    [name] spironolactone (ALDACTONE) 25 MG  tablet Take 0.5 tablets (12.5 mg total) by mouth daily. 45 tablet 3  . torsemide (DEMADEX) 20 MG tablet Take 1 tablet (20 mg total) by mouth as directed. Starting today 01/25/15 For 3 days, take 40 mg in the morning and 20 mg in the evening, then resume 20 mg twice daily 66 tablet 6  . valsartan (DIOVAN) 80 MG tablet Take 1 tablet (80 mg total) by mouth daily. 90 tablet 3   No current facility-administered medications for this visit.    Allergies:   Review of patient's allergies indicates no known allergies.   Social History:  The patient  reports that he has never smoked. He has never used smokeless tobacco. He reports that he does not drink alcohol or use illicit drugs.   Family History:  The patient's  family history includes Asthma (age of onset: 36) in his father; Diabetes in an other family member.    ROS:  Please see the history of present illness.   All other systems are reviewed and negative.    PHYSICAL EXAM: VS:  BP 100/78 mmHg  Pulse 60  Ht 5\' 11"  (1.803 m)  Wt 122.471 kg (270 lb)  BMI 37.67 kg/m2  SpO2 95% , BMI Body mass index is 37.67 kg/(m^2). GEN: chronically ill, in no acute distress HEENT: normal Neck: no JVD, carotid bruits, or masses Cardiac: RRR; no murmurs, rubs, or gallops,+2 edema  Respiratory:  clear to auscultation bilaterally, normal work of breathing GI: soft, nontender, nondistended, + BS MS: R leg is in a boot Skin: warm and dry  Neuro:  Strength and sensation are intact Psych: euthymic mood, full affect  EKG:  EKGs are reviewed   Recent Labs: 10/15/2014: ALT 22; B Natriuretic Peptide 751.7* 10/16/2014: Magnesium 2.8* 10/18/2014: BUN 34*; Potassium 4.4; Sodium 134* 12/05/2014: Hemoglobin 13.2; Platelets 239 01/15/2015: Creatinine, Ser 1.00    Lipid Panel  No results found for: CHOL, TRIG, HDL, CHOLHDL, VLDL, LDLCALC, LDLDIRECT   Wt Readings from Last 3 Encounters:  01/25/15 122.471 kg (270 lb)  11/29/14 114.76 kg (253 lb)  10/18/14 114.034  kg (251 lb 6.4 oz)     Other studies Reviewed: Additional studies/ records that were reviewed today include: Dr Raylene Everts notes are reviewed, prior echo is reviewed   ASSESSMENT AND PLAN:   1.  Acute on chronic systolic dysfunction Volume overloaded today and optivol is elevated Stable on an appropriate medical regimen Normal BiV ICD function See [facility] report Increase dimidex x 3 days to 40mg  qam, 20mg  qpm and then return to 20mg  BID Daily weights, 2 gram sodium restriction encouraged  2. Atrial tachycardia/ atrial fibrillation Burden is 2.8% Continue current medicine strategy On low dose pradax following prior bladder tumor surgery.  Hopefully can increase to full strength anticoagulation upon follow-up with [name] If he has further bleeding, we could consider left atrial appendage  occlusion procedure as an alternative  3. Obesity Weight loss is advised  4. OSA He reports compliance with CPAP  carelink Return to see me in 1 year      Current medicines are reviewed at length with the patient today.   The patient does not have concerns regarding his medicines.  The following changes were made today:  none  Signed, Thompson Grayer, MD  01/25/2015 10:15 AM     [facility] [address] [place] [address] [phone] (office) [phone] (fax)

## 2015-01-25 NOTE — Patient Instructions (Addendum)
Your physician has recommended you make the following change in your medication:  For 3 days only, increase torsemide to 40 mg in the morning and 20 mg in the evening, then resume torsemide 20 mg twice daily. Continue all other medications the same. Carelink device check on 04/29/15. Your physician recommends that you schedule a follow-up appointment in: 12 months with Dr. [name]. You will receive a reminder letter in the mail in about 10 months reminding you to call and schedule your appointment. If you don't receive this letter, please contact our office. Please follow the information given to you today on a 2 gram sodium diet per day. Your physician recommends that you weigh, daily, at the same time every day, and in the same amount of clothing. Please record your daily weights on the handout provided and bring it to your next appointment.

## 2015-02-25 ENCOUNTER — Ambulatory Visit (INDEPENDENT_AMBULATORY_CARE_PROVIDER_SITE_OTHER): Payer: Medicare Other | Admitting: *Deleted

## 2015-02-25 DIAGNOSIS — Z95810 Presence of automatic (implantable) cardiac defibrillator: Secondary | ICD-10-CM

## 2015-02-25 DIAGNOSIS — I5023 Acute on chronic systolic (congestive) heart failure: Secondary | ICD-10-CM

## 2015-02-26 ENCOUNTER — Encounter: Payer: Self-pay | Admitting: *Deleted

## 2015-02-26 ENCOUNTER — Telehealth: Payer: Self-pay | Admitting: *Deleted

## 2015-02-26 NOTE — Telephone Encounter (Signed)
ICM transmission received. I left a message for the patient to call at his home and cell #'s. 

## 2015-02-26 NOTE — Telephone Encounter (Signed)
I spoke with the patient. 

## 2015-02-26 NOTE — Progress Notes (Signed)
EPIC Encounter for ICM Monitoring  Patient Name: [name] is a 54 y.o. male Date: 02/26/2015 Primary Care Physican: [name], MD Primary Cardiologist: Bronson Ing Electrophysiologist: Allred Dry Weight: 270 lbs       In the past month, have you:  1. Gained more than 2 pounds in a day or more than 5 pounds in a week? The patient reports that his weight has steadily been going up. The lowest documented weight I see is 253 lbs on 11/29/14 when he saw Dr. Bronson Ing in the office. He saw Dr. [name] for follow up on 01/25/15 and was 270 lbs in the office.   2. Had changes in your medications (with verification of current medications)? Yes. Optivol readings were up when the patient saw Dr. [name] in the office on 01/25/15. He was instructed to increase torsemide to 40 mg in the am and 20 mg in the pm x 3 days then resume 20 mg BID.   3. Had more shortness of breath than is usual for you? The patient states he is intermittently SOB.  4. Limited your activity because of shortness of breath? no  5. Not been able to sleep because of shortness of breath? no  6. Had increased swelling in your feet or ankles? Minimal swelling noted to his lower extremities.   7. Had symptoms of dehydration (dizziness, dry mouth, increased thirst, decreased urine output) no  8. Had changes in sodium restriction? no  9. Been compliant with medication? Yes   ICM trend:   Follow-up plan: ICM clinic phone appointment: 04/01/15. The patient's optivol readings have been elevated from ~ 5/9- present. Impedence does appear to be approaching baseline. With weight still at 270 lbs and some lower extremity swelling noted, I have advised him to take an extra 20 mg of torsemide this afternoon. He is scheduled to follow up tomorrow with Dr. Bronson Ing.   Copy of note sent to patient's primary care physician, primary cardiologist, and device following physician.  Alvis Lemmings, RN, BSN 02/26/2015 1:12 PM

## 2015-02-27 ENCOUNTER — Encounter: Payer: Self-pay | Admitting: Cardiovascular Disease

## 2015-02-27 ENCOUNTER — Ambulatory Visit (INDEPENDENT_AMBULATORY_CARE_PROVIDER_SITE_OTHER): Payer: Medicare Other | Admitting: Cardiovascular Disease

## 2015-02-27 VITALS — BP 128/72 | HR 67 | Ht 71.0 in | Wt 273.3 lb

## 2015-02-27 DIAGNOSIS — I480 Paroxysmal atrial fibrillation: Secondary | ICD-10-CM | POA: Diagnosis not present

## 2015-02-27 DIAGNOSIS — Z79899 Other long term (current) drug therapy: Secondary | ICD-10-CM

## 2015-02-27 DIAGNOSIS — I10 Essential (primary) hypertension: Secondary | ICD-10-CM

## 2015-02-27 DIAGNOSIS — N183 Chronic kidney disease, stage 3 unspecified: Secondary | ICD-10-CM

## 2015-02-27 DIAGNOSIS — R609 Edema, unspecified: Secondary | ICD-10-CM

## 2015-02-27 DIAGNOSIS — I5023 Acute on chronic systolic (congestive) heart failure: Secondary | ICD-10-CM | POA: Diagnosis not present

## 2015-02-27 DIAGNOSIS — Z95810 Presence of automatic (implantable) cardiac defibrillator: Secondary | ICD-10-CM

## 2015-02-27 DIAGNOSIS — Z7901 Long term (current) use of anticoagulants: Secondary | ICD-10-CM

## 2015-02-27 MED ORDER — METOLAZONE 5 MG PO TABS: ORAL_TABLET | ORAL | Status: DC

## 2015-02-27 MED ORDER — DABIGATRAN ETEXILATE MESYLATE 150 MG PO CAPS: 150.0000 mg | ORAL_CAPSULE | Freq: Two times a day (BID) | ORAL | Status: DC

## 2015-02-27 NOTE — Patient Instructions (Addendum)
Your physician recommends that you schedule a follow-up appointment in: 1 week with Estella Husk PA-C   INCREASE Torsemide to 40 mg twice a day for 4 days and then resume 20 mg daily dose  INCREASE Pradaxa to 150 mg twice a day  TAKE Metolazone 5  mg daily for 4 days and then STOP    Lab work on Monday 03/04/15

## 2015-02-27 NOTE — Progress Notes (Signed)
Patient ID: [name], male   DOB: 03/08/1961, 54 y.o.   MRN: [id number]      SUBJECTIVE: The patient returns for follow up for chronic systolic heart failure and has a CRT-D system in place. Echocardiogram performed on 04/24/14 demonstrated moderate to severely reduced left ventricular systolic function, EF 32%, grade 3 diastolic dysfunction and aortic sclerosis but no significant stenosis.  Device interrogation on 01/25/15 demonstrated iIncreased Optivol readings with subsequent torsemide adjustments made by Dr. [name]. Weight up to 273 lbs (253 lbs on 11/29/14).  He urinated frequently after the increase torsemide dose for 3 days but then this tapered off. He has only a mild increase in shortness of breath but does feel abdominal distention. He denies chest pain and orthopnea as well as PND. He denies hematuria.   Review of Systems: As per "subjective", otherwise negative.  No Known Allergies  Current Outpatient Prescriptions  Medication Sig Dispense Refill  . allopurinol (ZYLOPRIM) 100 MG tablet Take 1 tablet (100 mg total) by mouth daily. 30 tablet 1  . atorvastatin (LIPITOR) 10 MG tablet Take 10 mg by mouth daily.      . Canagliflozin (INVOKANA) 300 MG TABS Take 300 mg by mouth daily.    . cloNIDine (CATAPRES) 0.2 MG tablet Take 0.2 mg by mouth 2 (two) times daily.      [name] COLCRYS 0.6 MG tablet Take 0.6 mg by mouth daily as needed (gout).     . dabigatran (PRADAXA) 75 MG CAPS capsule Take 1 capsule (75 mg total) by mouth 2 (two) times daily. 60 capsule 6  . digoxin (LANOXIN) 0.25 MG tablet Take 0.125 mg by mouth daily.    [name] gemfibrozil (LOPID) 600 MG tablet Take 600 mg by mouth 2 (two) times daily before a meal.      . insulin detemir (LEVEMIR) 100 UNIT/ML injection Inject 0.5 mLs (50 Units total) into the skin 2 (two) times daily. 10 mL 1  . loperamide (IMODIUM) 2 MG capsule Take 1 capsule (2 mg total) by mouth as needed for diarrhea or loose stools. 30 capsule 0  . metoprolol  (LOPRESSOR) 50 MG tablet Take 1.5 tablets (75 mg total) by mouth 2 (two) times daily. 180 tablet 3  . prazosin (MINIPRESS) 1 MG capsule TAKE ONE CAPSULE BY MOUTH TWICE DAILY 60 capsule 6  . sitaGLIPtin (JANUVIA) 100 MG tablet Take 100 mg by mouth daily.    [name] spironolactone (ALDACTONE) 25 MG tablet Take 0.5 tablets (12.5 mg total) by mouth daily. 45 tablet 3  . torsemide (DEMADEX) 20 MG tablet Take 1 tablet (20 mg total) by mouth as directed. Starting today 01/25/15 For 3 days, take 40 mg in the morning and 20 mg in the evening, then resume 20 mg twice daily 66 tablet 6  . valsartan (DIOVAN) 80 MG tablet Take 1 tablet (80 mg total) by mouth daily. 90 tablet 3   No current facility-administered medications for this visit.    Past Medical History  Diagnosis Date  . Hypertension   . Morbid obesity   . Atrial flutter  10/01/2006, 12/30/11    Status post RF ablation, 7/13, Dr. [name]  . OSA (obstructive sleep apnea)     compliant with CPAP  . Nonischemic cardiomyopathy     Normal coronary arteries, 2008  . Chronic systolic dysfunction of left ventricle     EF 20-25%, global HK; mild RVD, 2-D echo, 5/13  . Unspecified disorder of kidney and ureter   . RBBB, anterior fascicular  block and incomplete LBBB   . Pulmonary hypertension     RVSP 50-55 mmHg, 5/13  . Valvular heart disease     Mild MR/TR, 2-D echo, 5/13  . Type II or unspecified type diabetes mellitus without mention of complication, not stated as uncontrolled     Past Surgical History  Procedure Laterality Date  . Bi-ventricular implantable cardioverter defibrillator  (crt-d)  10/27/2013    MDT [name] XT CRTD implanted by [name] for NICM, CHF  . Atrial flutter ablation N/A 03/04/2012    Procedure: ATRIAL FLUTTER ABLATION;  Surgeon: Thompson Grayer, MD;  Location: [facility] CATH LAB;  Service: Cardiovascular;  Laterality: N/A;  . Bi-ventricular implantable cardioverter defibrillator N/A 10/27/2013    Procedure: BI-VENTRICULAR  IMPLANTABLE CARDIOVERTER DEFIBRILLATOR  (CRT-D);  Surgeon: Coralyn Mark, MD;  Location: [facility] CATH LAB;  Service: Cardiovascular;  Laterality: N/A;  . Cystoscopy/retrograde/ureteroscopy Bilateral 10/16/2014    Procedure: Kathrene Alu WITH BILATERAL Freddie Breech WITH GYRUS;  Surgeon: Malka So, MD;  Location: [facility] ORS;  Service: Urology;  Laterality: Bilateral;  . Transurethral resection of bladder tumor with gyrus (turbt-gyrus) N/A 10/16/2014    Procedure: TRANSURETHRAL RESECTION OF BLADDER TUMOR WITH GYRUS (TURBT-GYRUS);  Surgeon: Malka So, MD;  Location: [facility] ORS;  Service: Urology;  Laterality: N/A;    History   Social History  . Marital Status: Married    Spouse Name: N/A  . Number of Children: N/A  . Years of Education: N/A   Occupational History  . DISABLED    Social History Main Topics  . Smoking status: Never Smoker   . Smokeless tobacco: Never Used  . Alcohol Use: No  . Drug Use: No  . Sexual Activity: Not on file   Other Topics Concern  . Not on file   Social History Narrative   Pt lives in [place] alone.  Disabled.  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     Filed Vitals:   02/27/15 0849  BP: 128/72  Pulse: 67  Height: 5\' 11"  (1.803 m)  Weight: 273 lb 4.8 oz (123.968 kg)  SpO2: 97%    PHYSICAL EXAM General: NAD HEENT: Normal. Neck: No JVD, no thyromegaly. Lungs: Clear to auscultation bilaterally with normal respiratory effort. CV: Regular rate and rhythm, normal S1/S2, no I7/T2, 1/6 systolic murmur along left sternal border and over RUSB. Right foot cast. Left leg with 1+ pretibial edema. Abdomen: Soft, nontender, obese, mild distention.  Neurologic: Alert and oriented x 3.  Psych: Normal affect. Skin: Normal. Musculoskeletal: Right foot cast.   ECG: Most recent ECG reviewed.      ASSESSMENT AND PLAN: 1. Acute on chronic systolic heart failure/nonischemic cardiomyopathy s/p CRTD, EF 35%: Device interrogation on 6/10 showed elevated  Optivol readings with subsequent torsemide adjustments made by Dr. [name]. Weight significantly increased. Will increase torsemide to 40 mg twice daily for 4 days and add metolazone 5 mg daily for 4 days. Will check a basic metabolic panel this upcoming Monday. Will have him follow-up in one week.  2. Essential HTN: Controlled. Med changes as per #1.  3. Aortic sclerosis: Echo results noted above with aortic valve sclerosis without stenosis.   4. Atrial fibrillation: HR controlled and CRT-D in place. Hgb 13.2 on 4/20. Will increase  Pradaxa to 150 mg bid.  Dispo: f/u one week with Gerrianne Scale PA-C.  Time spent: 40 minutes, of which greater than 50% was spent reviewing symptoms, relevant blood tests and studies, and discussing management plan with  the patient.   Kate Sable, M.D., F.A.C.C.

## 2015-03-05 LAB — BASIC METABOLIC PANEL
BUN: 50 mg/dL — ABNORMAL HIGH (ref 6–23)
CO2: 28 meq/L (ref 19–32)
Calcium: 9.5 mg/dL (ref 8.4–10.5)
Chloride: 94 mEq/L — ABNORMAL LOW (ref 96–112)
Creat: 1.39 mg/dL — ABNORMAL HIGH (ref 0.50–1.35)
Glucose, Bld: 374 mg/dL — ABNORMAL HIGH (ref 70–99)
POTASSIUM: 4.7 meq/L (ref 3.5–5.3)
SODIUM: 136 meq/L (ref 135–145)

## 2015-03-06 ENCOUNTER — Ambulatory Visit (INDEPENDENT_AMBULATORY_CARE_PROVIDER_SITE_OTHER): Payer: Medicare Other | Admitting: Physician Assistant

## 2015-03-06 ENCOUNTER — Encounter: Payer: Self-pay | Admitting: Physician Assistant

## 2015-03-06 VITALS — BP 128/68 | HR 74 | Ht 71.0 in | Wt 268.4 lb

## 2015-03-06 DIAGNOSIS — I4891 Unspecified atrial fibrillation: Secondary | ICD-10-CM | POA: Diagnosis not present

## 2015-03-06 DIAGNOSIS — I5023 Acute on chronic systolic (congestive) heart failure: Secondary | ICD-10-CM | POA: Diagnosis not present

## 2015-03-06 DIAGNOSIS — I10 Essential (primary) hypertension: Secondary | ICD-10-CM

## 2015-03-06 DIAGNOSIS — I428 Other cardiomyopathies: Secondary | ICD-10-CM

## 2015-03-06 DIAGNOSIS — I429 Cardiomyopathy, unspecified: Secondary | ICD-10-CM

## 2015-03-06 MED ORDER — TORSEMIDE 20 MG PO TABS: 20.0000 mg | ORAL_TABLET | ORAL | Status: DC

## 2015-03-06 NOTE — Assessment & Plan Note (Signed)
Blood pressure stable ? ?

## 2015-03-06 NOTE — Assessment & Plan Note (Signed)
Patient has diuresed 5 pounds since last week. He is feeling better but attributes it to not being in atrial fibrillation. He is still up 15 pounds. Will increase Demadex to 40 mg in the morning 20 in the evening. I will see him back in follow-up next week. 2 g sodium diet. Follow-up renal function next week.

## 2015-03-06 NOTE — Patient Instructions (Signed)
Your physician recommends that you schedule a follow-up appointment in: 1 week with Ermalinda Barrios PA-C  Your physician recommends that you schedule a follow-up appointment in: 1 month with Dr. Bronson Ing  Your physician has recommended you make the following change in your medication:   Increase your Demadex to Starting today 03/06/15 TAKE TWO TABLETS (40 MG) TOTAL  IN THE AM AND ONE TABLET (20 MG) TOTAL IN THE EVENING  Your physician recommends that you return for lab work in: 1 week in the morning before your visit.   Thank you for choosing [facility]!

## 2015-03-06 NOTE — Progress Notes (Signed)
Cardiology Office Note   Date:  03/06/2015   ID:  [name], DOB October 13, 1960, MRN [id number]  PCP:  [name], MD  Cardiologist:  Dr. Bronson Ing  Chief Complaint: fluid build up    History of Present Illness: [name] is a 54 y.o. male who presents for follow-up of CHF. Patient has chronic systolic heart failure with echo in 04/2014 demonstrating moderate to severely reduced LV function EF 35% with grade 3 diastolic dysfunction and aortic sclerosis but no stenosis. He has a CRT/D system in place. Optimal readings on 01/25/15 were increased and Dr. [name] adjusted his torsemide. Last week he had gained 20 pounds and his Demadex was increased to 40 mg twice a day for 4 days and metolazone 5 mg daily for 4 days. He has lost 5 pounds since then. He says his breathing is better but thinks it's because he is in normal rhythm and not having as much atrial fibrillation as he was having 2-3 weeks ago. Creatinine 2 days ago was 1.39 which is close to baseline for him. Overall he is feeling better but his weight is still up about 15 pounds.    Past Medical History  Diagnosis Date  . Hypertension   . Morbid obesity   . Atrial flutter  10/01/2006, 12/30/11    Status post RF ablation, 7/13, Dr. [name]  . OSA (obstructive sleep apnea)     compliant with CPAP  . Nonischemic cardiomyopathy     Normal coronary arteries, 2008  . Chronic systolic dysfunction of left ventricle     EF 20-25%, global HK; mild RVD, 2-D echo, 5/13  . Unspecified disorder of kidney and ureter   . RBBB, anterior fascicular block and incomplete LBBB   . Pulmonary hypertension     RVSP 50-55 mmHg, 5/13  . Valvular heart disease     Mild MR/TR, 2-D echo, 5/13  . Type II or unspecified type diabetes mellitus without mention of complication, not stated as uncontrolled     Past Surgical History  Procedure Laterality Date  . Bi-ventricular implantable cardioverter defibrillator  (crt-d)  10/27/2013    MDT [name]  XT CRTD implanted by [name] for NICM, CHF  . Atrial flutter ablation N/A 03/04/2012    Procedure: ATRIAL FLUTTER ABLATION;  Surgeon: Thompson Grayer, MD;  Location: [facility] CATH LAB;  Service: Cardiovascular;  Laterality: N/A;  . Bi-ventricular implantable cardioverter defibrillator N/A 10/27/2013    Procedure: BI-VENTRICULAR IMPLANTABLE CARDIOVERTER DEFIBRILLATOR  (CRT-D);  Surgeon: Coralyn Mark, MD;  Location: [facility] CATH LAB;  Service: Cardiovascular;  Laterality: N/A;  . Cystoscopy/retrograde/ureteroscopy Bilateral 10/16/2014    Procedure: Kathrene Alu WITH BILATERAL Freddie Breech WITH GYRUS;  Surgeon: Malka So, MD;  Location: [facility] ORS;  Service: Urology;  Laterality: Bilateral;  . Transurethral resection of bladder tumor with gyrus (turbt-gyrus) N/A 10/16/2014    Procedure: TRANSURETHRAL RESECTION OF BLADDER TUMOR WITH GYRUS (TURBT-GYRUS);  Surgeon: Malka So, MD;  Location: [facility] ORS;  Service: Urology;  Laterality: N/A;     Current Outpatient Prescriptions  Medication Sig Dispense Refill  . allopurinol (ZYLOPRIM) 100 MG tablet Take 1 tablet (100 mg total) by mouth daily. 30 tablet 1  . atorvastatin (LIPITOR) 10 MG tablet Take 10 mg by mouth daily.      . Canagliflozin (INVOKANA) 300 MG TABS Take 300 mg by mouth daily.    . cloNIDine (CATAPRES) 0.2 MG tablet Take 0.2 mg by mouth 2 (two) times daily.      [name] COLCRYS 0.6  MG tablet Take 0.6 mg by mouth daily as needed (gout).     . dabigatran (PRADAXA) 150 MG CAPS capsule Take 1 capsule (150 mg total) by mouth 2 (two) times daily. 60 capsule 6  . digoxin (LANOXIN) 0.25 MG tablet Take 0.125 mg by mouth daily.    [name] gemfibrozil (LOPID) 600 MG tablet Take 600 mg by mouth 2 (two) times daily before a meal.      . insulin detemir (LEVEMIR) 100 UNIT/ML injection Inject 0.5 mLs (50 Units total) into the skin 2 (two) times daily. 10 mL 1  . loperamide (IMODIUM) 2 MG capsule Take 1 capsule (2 mg total) by mouth as needed for diarrhea or loose stools. 30 capsule 0   . metolazone (ZAROXOLYN) 5 MG tablet Take 5 mg daily for the next 4 days and then STOP 4 tablet 0  . metoprolol (LOPRESSOR) 50 MG tablet Take 1.5 tablets (75 mg total) by mouth 2 (two) times daily. 180 tablet 3  . prazosin (MINIPRESS) 1 MG capsule TAKE ONE CAPSULE BY MOUTH TWICE DAILY 60 capsule 6  . sitaGLIPtin (JANUVIA) 100 MG tablet Take 100 mg by mouth daily.    [name] spironolactone (ALDACTONE) 25 MG tablet Take 0.5 tablets (12.5 mg total) by mouth daily. 45 tablet 3  . torsemide (DEMADEX) 20 MG tablet Take 1 tablet (20 mg total) by mouth as directed. Starting today 01/25/15 For 3 days, take 40 mg in the morning and 20 mg in the evening, then resume 20 mg twice daily 66 tablet 6  . valsartan (DIOVAN) 80 MG tablet Take 1 tablet (80 mg total) by mouth daily. 90 tablet 3   No current facility-administered medications for this visit.    Allergies:   Review of patient's allergies indicates no known allergies.    Social History:  The patient  reports that he has never smoked. He has never used smokeless tobacco. He reports that he does not drink alcohol or use illicit drugs.   Family History:  The patient's    family history includes Asthma (age of onset: 43) in his father; Diabetes in an other family member.    ROS:  Please see the history of present illness.   Otherwise, review of systems are positive for none.   All other systems are reviewed and negative.    PHYSICAL EXAM: VS:  BP 128/68 mmHg  Pulse 74  Ht 5\' 11"  (1.803 m)  Wt 268 lb 6.4 oz (121.745 kg)  BMI 37.45 kg/m2  SpO2 93% , BMI Body mass index is 37.45 kg/(m^2). GEN: Well nourished, well developed, in no acute distress Neck: no JVD, HJR, carotid bruits, or masses Cardiac: RRR; 1/6 systolic murmur at the left sternal border, no gallop, rubs, thrill or heave,  Respiratory:  clear to auscultation bilaterally, normal work of breathing GI: soft, nontender, nondistended, + BS MS: no deformity or atrophy Extremities: Chronic  Brawny changes with 1+ edema bilaterally, boot on right foot   Skin: warm and dry, no rash Neuro:  Strength and sensation are intact    EKG:  EKG is ordered today. The ekg ordered today demonstrates normal sinus rhythm, paced rhythm   Recent Labs: 10/15/2014: ALT 22; B Natriuretic Peptide 751.7* 10/16/2014: Magnesium 2.8* 12/05/2014: Hemoglobin 13.2; Platelets 239 03/04/2015: BUN 50*; Creat 1.39*; Potassium 4.7; Sodium 136    Lipid Panel No results found for: CHOL, TRIG, HDL, CHOLHDL, VLDL, LDLCALC, LDLDIRECT    Wt Readings from Last 3 Encounters:  03/06/15 268 lb 6.4 oz (  121.745 kg)  02/27/15 273 lb 4.8 oz (123.968 kg)  01/25/15 270 lb (122.471 kg)      Other studies Reviewed: Additional studies/ records that were reviewed today include and review of the records demonstrates:  Study Conclusions  - Left ventricle: Limited study. Definity contrast employed. The   estimated ejection fraction was approximately 35%. Diffuse   hypokinesis, most severe in the apex and lateral wall. No obvious   LV mural thrombus.   ASSESSMENT AND PLAN:   Sumner Boast, PA-C  03/06/2015 1:03 PM    [facility] [place], [place], [place]  [address] Phone: [phone]; Fax: [phone]

## 2015-03-07 ENCOUNTER — Telehealth: Payer: Self-pay

## 2015-03-07 NOTE — Telephone Encounter (Signed)
-----   Message from Arnoldo Lenis, MD sent at 03/07/2015  3:57 PM EDT ----- Labs show mild increased stress on the kidneys from recent aggressive fluid pills. Make sure he has stopped taking his metolazone and has decreased toresmide to 40mg  in AM and 20mg  at night per PA Lenze last note. Needs repeat BMET and Mg next week before his f/u with Mittie Bodo MD

## 2015-03-07 NOTE — Telephone Encounter (Signed)
Pt has decreased medicatons as ordered below,has f/u apt as plannned

## 2015-03-13 ENCOUNTER — Encounter: Payer: Self-pay | Admitting: Physician Assistant

## 2015-03-13 ENCOUNTER — Ambulatory Visit (INDEPENDENT_AMBULATORY_CARE_PROVIDER_SITE_OTHER): Payer: Medicare Other | Admitting: Physician Assistant

## 2015-03-13 VITALS — BP 130/80 | HR 75 | Ht 71.0 in | Wt 271.8 lb

## 2015-03-13 DIAGNOSIS — I5023 Acute on chronic systolic (congestive) heart failure: Secondary | ICD-10-CM | POA: Diagnosis not present

## 2015-03-13 DIAGNOSIS — I428 Other cardiomyopathies: Secondary | ICD-10-CM

## 2015-03-13 DIAGNOSIS — I429 Cardiomyopathy, unspecified: Secondary | ICD-10-CM | POA: Diagnosis not present

## 2015-03-13 DIAGNOSIS — I4892 Unspecified atrial flutter: Secondary | ICD-10-CM

## 2015-03-13 NOTE — Patient Instructions (Signed)
Your physician recommends that you schedule a follow-up appointment in: with Dr.Koneswaran as planned 04/16/15 at 65 am   Your physician recommends that you continue on your current medications as directed. Please refer to the Current Medication list given to you today.    Please continue to follow low sodium diet      Thank you for choosing [facility] !

## 2015-03-13 NOTE — Assessment & Plan Note (Signed)
EF 35% with grade 3 diastolic dysfunction. Continue to monitor closely.

## 2015-03-13 NOTE — Assessment & Plan Note (Signed)
Patient feels much better when he is in sinus rhythm. He hasn't had any symptoms of atrial fib/flutter in a couple weeks.

## 2015-03-13 NOTE — Assessment & Plan Note (Signed)
Patient's weight is actually gone up a couple pounds. His feeling better, his edema is decreased and he is breathing easier. I will wait to see what is kidney function is on blood work drawn this morning. I don't want to increase his diuretics at this time. He has an ICM clinic phone appt 04/01/15. It's really difficult to know what his baseline weight should be as his symptoms are greatly improved. Continue 2 g sodium diet.

## 2015-03-13 NOTE — Progress Notes (Signed)
Cardiology Office Note   Date:  03/13/2015   ID:  [name], DOB 04/27/1961, MRN [id number]  PCP:  [name], MD  Cardiologist:  Dr. Bronson Ing  Chief Complaint: fluid build up    History of Present Illness: [name] is a 54 y.o. male who presents for follow-up of CHF. Patient has chronic systolic heart failure with echo in 04/2014 demonstrating moderate to severely reduced LV function EF 35% with grade 3 diastolic dysfunction and aortic sclerosis but no stenosis. He has a CRT/D system in place. Optimal readings on 01/25/15 were increased and Dr. [name] adjusted his torsemide. 2 weeks ago he had gained 20 pounds and his Demadex was increased to 40 mg twice a day for 4 days and metolazone 5 mg daily for 4 days. I saw him last week and he had lost 5 pounds since then. He says his breathing was better but thinks it's because he was in normal rhythm and not having as much atrial fibrillation as he was having 2-3 weeks ago. Creatinine  was 1.39 which is close to baseline for him. Overall he was feeling better but his weight is still up about 15 pounds.I increased his Demadex to 40 mg in am 20 mg pm.  The patient comes in today feeling quite well his weight is 171 pounds but his edema is down, he is urinating on the increased Demadex and his breathing is better. He has had no more atrial fibrillation according to him. In reviewing his optival notes his weight was 253 in 11/2014, but has been between 268-271 since then. He has a ICM clinic phone appt 04/01/15. He had blood work drawn this morning but I don't have it back yet.    Past Medical History  Diagnosis Date  . Hypertension   . Morbid obesity   . Atrial flutter  10/01/2006, 12/30/11    Status post RF ablation, 7/13, Dr. [name]  . OSA (obstructive sleep apnea)     compliant with CPAP  . Nonischemic cardiomyopathy     Normal coronary arteries, 2008  . Chronic systolic dysfunction of left ventricle     EF 20-25%, global HK; mild  RVD, 2-D echo, 5/13  . Unspecified disorder of kidney and ureter   . RBBB, anterior fascicular block and incomplete LBBB   . Pulmonary hypertension     RVSP 50-55 mmHg, 5/13  . Valvular heart disease     Mild MR/TR, 2-D echo, 5/13  . Type II or unspecified type diabetes mellitus without mention of complication, not stated as uncontrolled     Past Surgical History  Procedure Laterality Date  . Bi-ventricular implantable cardioverter defibrillator  (crt-d)  10/27/2013    MDT [name] XT CRTD implanted by [name] for NICM, CHF  . Atrial flutter ablation N/A 03/04/2012    Procedure: ATRIAL FLUTTER ABLATION;  Surgeon: Thompson Grayer, MD;  Location: [facility] CATH LAB;  Service: Cardiovascular;  Laterality: N/A;  . Bi-ventricular implantable cardioverter defibrillator N/A 10/27/2013    Procedure: BI-VENTRICULAR IMPLANTABLE CARDIOVERTER DEFIBRILLATOR  (CRT-D);  Surgeon: Coralyn Mark, MD;  Location: [facility] C[facility] LAB;  Service: Cardiovascular;  Laterality: N/A;  . Cystoscopy/retrograde/ureteroscopy Bilateral 10/16/2014    Procedure: Kathrene Alu WITH BILATERAL Freddie Breech WITH GYRUS;  Surgeon: Malka So, MD;  Location: [facility] ORS;  Service: Urology;  Laterality: Bilateral;  . Transurethral resection of bladder tumor with gyrus (turbt-gyrus) N/A 10/16/2014    Procedure: TRANSURETHRAL RESECTION OF BLADDER TUMOR WITH GYRUS (TURBT-GYRUS);  Surgeon: Malka So, MD;  Location: [facility] ORS;  Service: Urology;  Laterality: N/A;     Current Outpatient Prescriptions  Medication Sig Dispense Refill  . allopurinol (ZYLOPRIM) 100 MG tablet Take 1 tablet (100 mg total) by mouth daily. 30 tablet 1  . atorvastatin (LIPITOR) 10 MG tablet Take 10 mg by mouth daily.      . Canagliflozin (INVOKANA) 300 MG TABS Take 300 mg by mouth daily.    . cloNIDine (CATAPRES) 0.2 MG tablet Take 0.2 mg by mouth 2 (two) times daily.      [name] COLCRYS 0.6 MG tablet Take 0.6 mg by mouth daily as needed (gout).     . dabigatran (PRADAXA) 150 MG CAPS  capsule Take 1 capsule (150 mg total) by mouth 2 (two) times daily. 60 capsule 6  . digoxin (LANOXIN) 0.25 MG tablet Take 0.125 mg by mouth daily.    [name] gemfibrozil (LOPID) 600 MG tablet Take 600 mg by mouth 2 (two) times daily before a meal.      . insulin detemir (LEVEMIR) 100 UNIT/ML injection Inject 0.5 mLs (50 Units total) into the skin 2 (two) times daily. 10 mL 1  . loperamide (IMODIUM) 2 MG capsule Take 1 capsule (2 mg total) by mouth as needed for diarrhea or loose stools. 30 capsule 0  . metoprolol (LOPRESSOR) 50 MG tablet Take 1.5 tablets (75 mg total) by mouth 2 (two) times daily. 180 tablet 3  . prazosin (MINIPRESS) 1 MG capsule TAKE ONE CAPSULE BY MOUTH TWICE DAILY 60 capsule 6  . sitaGLIPtin (JANUVIA) 100 MG tablet Take 100 mg by mouth daily.    [name] spironolactone (ALDACTONE) 25 MG tablet Take 0.5 tablets (12.5 mg total) by mouth daily. 45 tablet 3  . torsemide (DEMADEX) 20 MG tablet Take 1 tablet (20 mg total) by mouth as directed. Starting today 03/06/15 TAKE TWO TABLETS (40 MG) TOTAL  IN THE AM AND ONE TABLET (20 MG) TOTAL IN THE EVENING 90 tablet 6  . valsartan (DIOVAN) 80 MG tablet Take 1 tablet (80 mg total) by mouth daily. 90 tablet 3   No current facility-administered medications for this visit.    Allergies:   Review of patient's allergies indicates no known allergies.    Social History:  The patient  reports that he has never smoked. He has never used smokeless tobacco. He reports that he does not drink alcohol or use illicit drugs.   Family History:  The patient's    family history includes Asthma (age of onset: 67) in his father; Diabetes in an other family member.    ROS:  Please see the history of present illness.   Otherwise, review of systems are positive for none.   All other systems are reviewed and negative.    PHYSICAL EXAM: VS:  BP 130/80 mmHg  Pulse 75  Ht 5\' 11"  (1.803 m)  Wt 271 lb 12.8 oz (123.288 kg)  BMI 37.93 kg/m2  SpO2 96% , BMI Body mass index  is 37.93 kg/(m^2). GEN: Well nourished, well developed, in no acute distress Neck: no JVD, HJR, carotid bruits, or masses Cardiac: RRR; 1/6 systolic murmur at the left sternal border, no gallop, rubs, thrill or heave,  Respiratory:  clear to auscultation bilaterally, normal work of breathing GI: soft, nontender, nondistended, + BS MS: no deformity or atrophy Extremities: Chronic Brawny changes with 1/2-1+ edema bilaterally, boot on right foot   Skin: warm and dry, no rash Neuro:  Strength and sensation are intact    EKG:  EKG  is not ordered today.    Recent Labs: 10/15/2014: ALT 22; B Natriuretic Peptide 751.7* 10/16/2014: Magnesium 2.8* 12/05/2014: Hemoglobin 13.2; Platelets 239 03/04/2015: BUN 50*; Creat 1.39*; Potassium 4.7; Sodium 136    Lipid Panel No results found for: CHOL, TRIG, HDL, CHOLHDL, VLDL, LDLCALC, LDLDIRECT    Wt Readings from Last 3 Encounters:  03/13/15 271 lb 12.8 oz (123.288 kg)  03/06/15 268 lb 6.4 oz (121.745 kg)  02/27/15 273 lb 4.8 oz (123.968 kg)      Other studies Reviewed: Additional studies/ records that were reviewed today include and review of the records demonstrates:  Study Conclusions  - Left ventricle: Limited study. Definity contrast employed. The   estimated ejection fraction was approximately 35%. Diffuse   hypokinesis, most severe in the apex and lateral wall. No obvious   LV mural thrombus.   ASSESSMENT AND PLAN:  Systolic dysfunction with acute on chronic heart failure Patient's weight is actually gone up a couple pounds. His feeling better, his edema is decreased and he is breathing easier. I will wait to see what is kidney function is on blood work drawn this morning. I don't want to increase his diuretics at this time. He has an ICM clinic phone appt 04/01/15. It's really difficult to know what his baseline weight should be as his symptoms are greatly improved. Continue 2 g sodium diet.  Non-ischemic cardiomyopathy EF 35% with  grade 3 diastolic dysfunction. Continue to monitor closely.  Atrial flutter Patient feels much better when he is in sinus rhythm. He hasn't had any symptoms of atrial fib/flutter in a couple weeks.    Sumner Boast, PA-C  03/13/2015 11:27 AM    [facility] [place], [place], [place]  [address] Phone: [phone]; Fax: [phone]

## 2015-03-14 LAB — RENAL FUNCTION PANEL
Albumin: 3.8 g/dL (ref 3.6–5.1)
BUN: 28 mg/dL — AB (ref 7–25)
CHLORIDE: 105 meq/L (ref 98–110)
CO2: 23 mEq/L (ref 20–31)
Calcium: 9.3 mg/dL (ref 8.6–10.3)
Creat: 0.97 mg/dL (ref 0.70–1.33)
Glucose, Bld: 121 mg/dL — ABNORMAL HIGH (ref 65–99)
Phosphorus: 4.1 mg/dL (ref 2.5–4.5)
Potassium: 4.4 mEq/L (ref 3.5–5.3)
SODIUM: 141 meq/L (ref 135–146)

## 2015-03-19 ENCOUNTER — Encounter (HOSPITAL_COMMUNITY): Payer: Self-pay

## 2015-03-19 ENCOUNTER — Encounter (HOSPITAL_COMMUNITY)
Admission: RE | Admit: 2015-03-19 | Discharge: 2015-03-19 | Disposition: A | Discharge status: Home / Self Care | Payer: Medicare Other | Source: Ambulatory Visit | Attending: Ophthalmology | Admitting: Ophthalmology

## 2015-03-19 DIAGNOSIS — H2511 Age-related nuclear cataract, right eye: Secondary | ICD-10-CM | POA: Insufficient documentation

## 2015-03-19 DIAGNOSIS — Z01818 Encounter for other preprocedural examination: Secondary | ICD-10-CM | POA: Diagnosis present

## 2015-03-19 LAB — BASIC METABOLIC PANEL
ANION GAP: 9 (ref 5–15)
BUN: 19 mg/dL (ref 6–20)
CALCIUM: 8.5 mg/dL — AB (ref 8.9–10.3)
CO2: 22 mmol/L (ref 22–32)
Chloride: 109 mmol/L (ref 101–111)
Creatinine, Ser: 1.04 mg/dL (ref 0.61–1.24)
GFR calc Af Amer: >60 mL/min (ref >60)
GFR calc non Af Amer: >60 mL/min (ref >60)
Glucose, Bld: 78 mg/dL (ref 65–99)
Potassium: 3.6 mmol/L (ref 3.5–5.1)
SODIUM: 140 mmol/L (ref 135–145)

## 2015-03-19 LAB — CBC WITH DIFFERENTIAL/PLATELET
Basophils Absolute: 0.1 10*3/uL (ref 0.0–0.1)
Basophils Relative: 1 % (ref 0–1)
EOS ABS: 0.3 10*3/uL (ref 0.0–0.7)
Eosinophils Relative: 3 % (ref 0–5)
HCT: 43.1 % (ref 39.0–52.0)
Hemoglobin: 14.3 g/dL (ref 13.0–17.0)
LYMPHS ABS: 2 10*3/uL (ref 0.7–4.0)
Lymphocytes Relative: 23 % (ref 12–46)
MCH: 29.5 pg (ref 26.0–34.0)
MCHC: 33.2 g/dL (ref 30.0–36.0)
MCV: 89 fL (ref 78.0–100.0)
MONO ABS: 0.7 10*3/uL (ref 0.1–1.0)
MONOS PCT: 7 % (ref 3–12)
Neutro Abs: 5.9 10*3/uL (ref 1.7–7.7)
Neutrophils Relative %: 66 % (ref 43–77)
PLATELETS: 205 10*3/uL (ref 150–400)
RBC: 4.84 MIL/uL (ref 4.22–5.81)
RDW: 13.9 % (ref 11.5–15.5)
WBC: 9 10*3/uL (ref 4.0–10.5)

## 2015-03-19 LAB — SURGICAL PCR SCREEN
MRSA, PCR: NEGATIVE
Staphylococcus aureus: POSITIVE — AB

## 2015-03-19 NOTE — Patient Instructions (Signed)
[name]  03/19/2015     @PREFPERIOPPHARMACY @   Your procedure is scheduled on 03/24/2013  Report to [facility] at 7:00 A.M.  Call this number if you have problems the morning of surgery:  [phone]   Remember:  Do not eat food or drink liquids after midnight.  Take these medicines the morning of surgery with A SIP OF WATER Allopurinol, Clonidine, Digoxin, Metoprolol, Minipress   Do not wear jewelry, make-up or nail polish.  Do not wear lotions, powders, or perfumes.  You may wear deodorant.  Do not shave 48 hours prior to surgery.  Men may shave face and neck.  Do not bring valuables to the hospital.  [facility] is not responsible for any belongings or valuables.  Contacts, dentures or bridgework may not be worn into surgery.  Leave your suitcase in the car.  After surgery it may be brought to your room.  For patients admitted to the hospital, discharge time will be determined by your treatment team.  Patients discharged the day of surgery will not be allowed to drive home.    Please read over the following fact sheets that you were given. Anesthesia Post-op Instructions     PATIENT INSTRUCTIONS POST-ANESTHESIA  IMMEDIATELY FOLLOWING SURGERY:  Do not drive or operate machinery for the first twenty four hours after surgery.  Do not make any important decisions for twenty four hours after surgery or while taking narcotic pain medications or sedatives.  If you develop intractable nausea and vomiting or a severe headache please notify your doctor immediately.  FOLLOW-UP:  Please make an appointment with your surgeon as instructed. You do not need to follow up with anesthesia unless specifically instructed to do so.  WOUND CARE INSTRUCTIONS (if applicable):  Keep a dry clean dressing on the anesthesia/puncture wound site if there is drainage.  Once the wound has quit draining you may leave it open to air.  Generally you should leave the bandage intact for twenty four  hours unless there is drainage.  If the epidural site drains for more than 36-48 hours please call the anesthesia department.  QUESTIONS?:  Please feel free to call your physician or the hospital operator if you have any questions, and they will be happy to assist you.      Cataract Surgery  A cataract is a clouding of the lens of the eye. When a lens becomes cloudy, vision is reduced based on the degree and nature of the clouding. Surgery may be needed to improve vision. Surgery removes the cloudy lens and usually replaces it with a substitute lens (intraocular lens, IOL). LET YOUR EYE DOCTOR KNOW ABOUT:  Allergies to food or medicine.  Medicines taken including herbs, eye drops, over-the-counter medicines, and creams.  Use of steroids (by mouth or creams).  Previous problems with anesthetics or numbing medicine.  History of bleeding problems or blood clots.  Previous surgery.  Other health problems, including diabetes and kidney problems.  Possibility of pregnancy, if this applies. RISKS AND COMPLICATIONS  Infection.  Inflammation of the eyeball (endophthalmitis) that can spread to both eyes (sympathetic ophthalmia).  Poor wound healing.  If an IOL is inserted, it can later fall out of proper position. This is very uncommon.  Clouding of the part of your eye that holds an IOL in place. This is called an "after-cataract." These are uncommon but easily treated. BEFORE THE PROCEDURE  Do not eat or drink anything except small amounts of water for 8  to 12 before your surgery, or as directed by your caregiver.  Unless you are told otherwise, continue any eye drops you have been prescribed.  Talk to your primary caregiver about all other medicines that you take (both prescription and nonprescription). In some cases, you may need to stop or change medicines near the time of your surgery. This is most important if you are taking blood-thinning medicine.Do not stop medicines unless  you are told to do so.  Arrange for someone to drive you to and from the procedure.  Do not put contact lenses in either eye on the day of your surgery. PROCEDURE There is more than one method for safely removing a cataract. Your doctor can explain the differences and help determine which is best for you. Phacoemulsification surgery is the most common form of cataract surgery.  An injection is given behind the eye or eye drops are given to make this a painless procedure.  A small cut (incision) is made on the edge of the clear, dome-shaped surface that covers the front of the eye (cornea).  A tiny probe is painlessly inserted into the eye. This device gives off ultrasound waves that soften and break up the cloudy center of the lens. This makes it easier for the cloudy lens to be removed by suction.  An IOL may be implanted.  The normal lens of the eye is covered by a clear capsule. Part of that capsule is intentionally left in the eye to support the IOL.  Your surgeon may or may not use stitches to close the incision. There are other forms of cataract surgery that require a larger incision and stitches to close the eye. This approach is taken in cases where the doctor feels that the cataract cannot be easily removed using phacoemulsification. AFTER THE PROCEDURE  When an IOL is implanted, it does not need care. It becomes a permanent part of your eye and cannot be seen or felt.  Your doctor will schedule follow-up exams to check on your progress.  Review your other medicines with your doctor to see which can be resumed after surgery.  Use eye drops or take medicine as prescribed by your doctor. Document Released: 07/23/2011 Document Revised: 12/18/2013 Document Reviewed: 07/23/2011 [facility] Patient Information 2015 [place], [place]. This information is not intended to replace advice given to you by your health care provider. Make sure you discuss any questions you have with your health  care provider.

## 2015-03-20 NOTE — Pre-Procedure Instructions (Signed)
Dr Patsey Berthold aware of ca+- 8.5. No orders given.

## 2015-03-22 MED ORDER — NEOMYCIN-POLYMYXIN-DEXAMETH 3.5-10000-0.1 OP SUSP
OPHTHALMIC | Status: AC
  Filled 2015-03-22: qty 5

## 2015-03-22 MED ORDER — LIDOCAINE HCL (PF) 1 % IJ SOLN
INTRAMUSCULAR | Status: AC
  Filled 2015-03-22: qty 2

## 2015-03-22 MED ORDER — LIDOCAINE HCL 3.5 % OP GEL
OPHTHALMIC | Status: AC
  Filled 2015-03-22: qty 1

## 2015-03-22 MED ORDER — TETRACAINE HCL 0.5 % OP SOLN
OPHTHALMIC | Status: AC
  Filled 2015-03-22: qty 2

## 2015-03-22 MED ORDER — PHENYLEPHRINE HCL 2.5 % OP SOLN
OPHTHALMIC | Status: AC
  Filled 2015-03-22: qty 15

## 2015-03-22 MED ORDER — CYCLOPENTOLATE-PHENYLEPHRINE OP SOLN OPTIME - NO CHARGE
OPHTHALMIC | Status: AC
  Filled 2015-03-22: qty 2

## 2015-03-25 ENCOUNTER — Encounter (HOSPITAL_COMMUNITY): Payer: Self-pay | Admitting: Ophthalmology

## 2015-03-25 ENCOUNTER — Ambulatory Visit (HOSPITAL_COMMUNITY): Payer: Medicare Other | Admitting: Anesthesiology

## 2015-03-25 ENCOUNTER — Encounter (HOSPITAL_COMMUNITY)
Admission: RE | Disposition: A | Discharge status: Home / Self Care | Payer: Self-pay | Source: Ambulatory Visit | Attending: Ophthalmology

## 2015-03-25 ENCOUNTER — Ambulatory Visit (HOSPITAL_COMMUNITY)
Admission: RE | Admit: 2015-03-25 | Discharge: 2015-03-25 | Disposition: A | Discharge status: Home / Self Care | Payer: Medicare Other | Source: Ambulatory Visit | Attending: Ophthalmology | Admitting: Ophthalmology

## 2015-03-25 DIAGNOSIS — I4891 Unspecified atrial fibrillation: Secondary | ICD-10-CM | POA: Diagnosis not present

## 2015-03-25 DIAGNOSIS — I10 Essential (primary) hypertension: Secondary | ICD-10-CM | POA: Diagnosis not present

## 2015-03-25 DIAGNOSIS — H25811 Combined forms of age-related cataract, right eye: Secondary | ICD-10-CM | POA: Diagnosis not present

## 2015-03-25 DIAGNOSIS — Z79899 Other long term (current) drug therapy: Secondary | ICD-10-CM | POA: Diagnosis not present

## 2015-03-25 DIAGNOSIS — E119 Type 2 diabetes mellitus without complications: Secondary | ICD-10-CM | POA: Diagnosis not present

## 2015-03-25 DIAGNOSIS — Z794 Long term (current) use of insulin: Secondary | ICD-10-CM | POA: Insufficient documentation

## 2015-03-25 DIAGNOSIS — H269 Unspecified cataract: Secondary | ICD-10-CM | POA: Diagnosis present

## 2015-03-25 HISTORY — PX: CATARACT EXTRACTION W/PHACO: SHX586

## 2015-03-25 LAB — GLUCOSE, CAPILLARY: GLUCOSE-CAPILLARY: 139 mg/dL — AB (ref 65–99)

## 2015-03-25 SURGERY — PHACOEMULSIFICATION, CATARACT, WITH IOL INSERTION
Anesthesia: Monitor Anesthesia Care | Site: Eye | Laterality: Right

## 2015-03-25 MED ORDER — MIDAZOLAM HCL 2 MG/2ML IJ SOLN
INTRAMUSCULAR | Status: AC
  Filled 2015-03-25: qty 2

## 2015-03-25 MED ORDER — FENTANYL CITRATE (PF) 100 MCG/2ML IJ SOLN
INTRAMUSCULAR | Status: AC
  Filled 2015-03-25: qty 2

## 2015-03-25 MED ORDER — BSS IO SOLN
INTRAOCULAR | Status: DC | PRN
  Administered 2015-03-25: 15 mL

## 2015-03-25 MED ORDER — TETRACAINE HCL 0.5 % OP SOLN
1.0000 [drp] | OPHTHALMIC | Status: AC
  Administered 2015-03-25 (×3): 1 [drp] via OPHTHALMIC

## 2015-03-25 MED ORDER — EPINEPHRINE HCL 1 MG/ML IJ SOLN
INTRAMUSCULAR | Status: AC
  Filled 2015-03-25: qty 1

## 2015-03-25 MED ORDER — POVIDONE-IODINE 5 % OP SOLN
OPHTHALMIC | Status: DC | PRN
  Administered 2015-03-25: 1 via OPHTHALMIC

## 2015-03-25 MED ORDER — MIDAZOLAM HCL 2 MG/2ML IJ SOLN
1.0000 mg | INTRAMUSCULAR | Status: DC | PRN
  Administered 2015-03-25 (×2): 2 mg via INTRAVENOUS
  Filled 2015-03-25: qty 2

## 2015-03-25 MED ORDER — LIDOCAINE HCL (PF) 1 % IJ SOLN
INTRAOCULAR | Status: DC | PRN
  Administered 2015-03-25: .7 mL via OPHTHALMIC

## 2015-03-25 MED ORDER — PHENYLEPHRINE HCL 2.5 % OP SOLN
1.0000 [drp] | OPHTHALMIC | Status: AC
  Administered 2015-03-25 (×3): 1 [drp] via OPHTHALMIC

## 2015-03-25 MED ORDER — FENTANYL CITRATE (PF) 100 MCG/2ML IJ SOLN
25.0000 ug | INTRAMUSCULAR | Status: AC
  Administered 2015-03-25 (×2): 25 ug via INTRAVENOUS

## 2015-03-25 MED ORDER — NEOMYCIN-POLYMYXIN-DEXAMETH 3.5-10000-0.1 OP SUSP
OPHTHALMIC | Status: DC | PRN
  Administered 2015-03-25: 2 [drp] via OPHTHALMIC

## 2015-03-25 MED ORDER — LACTATED RINGERS IV SOLN
INTRAVENOUS | Status: DC
  Administered 2015-03-25: 08:00:00 via INTRAVENOUS

## 2015-03-25 MED ORDER — LIDOCAINE HCL 3.5 % OP GEL
1.0000 "application " | Freq: Once | OPHTHALMIC | Status: AC
  Administered 2015-03-25: 1 via OPHTHALMIC

## 2015-03-25 MED ORDER — PROVISC 10 MG/ML IO SOLN
INTRAOCULAR | Status: DC | PRN
Start: 2015-03-25 — End: 2015-03-25
  Administered 2015-03-25: 0.85 mL via INTRAOCULAR

## 2015-03-25 MED ORDER — CYCLOPENTOLATE-PHENYLEPHRINE 0.2-1 % OP SOLN
1.0000 [drp] | OPHTHALMIC | Status: AC
  Administered 2015-03-25 (×3): 1 [drp] via OPHTHALMIC

## 2015-03-25 MED ORDER — BSS IO SOLN
INTRAOCULAR | Status: DC | PRN
  Administered 2015-03-25: 500 mL

## 2015-03-25 SURGICAL SUPPLY — 11 items
CLOTH BEACON ORANGE TIMEOUT ST (SAFETY) ×2 IMPLANT
EYE SHIELD UNIVERSAL CLEAR (GAUZE/BANDAGES/DRESSINGS) ×2 IMPLANT
GLOVE BIOGEL PI IND STRL 7.0 (GLOVE) IMPLANT
GLOVE BIOGEL PI INDICATOR 7.0 (GLOVE) ×2
GLOVE EXAM NITRILE MD LF STRL (GLOVE) ×2 IMPLANT
PAD ARMBOARD 7.5X6 YLW CONV (MISCELLANEOUS) ×2 IMPLANT
SIGHTPATH CAT PROC W REG LENS (Ophthalmic Related) ×3 IMPLANT
SYRINGE LUER LOK 1CC (MISCELLANEOUS) ×3 IMPLANT
TAPE SURG TRANSPORE 1 IN (GAUZE/BANDAGES/DRESSINGS) IMPLANT
TAPE SURGICAL TRANSPORE 1 IN (GAUZE/BANDAGES/DRESSINGS) ×2
WATER STERILE IRR 250ML POUR (IV SOLUTION) ×2 IMPLANT

## 2015-03-25 NOTE — H&P (Signed)
I have reviewed the H&P, the patient was re-examined, and I have identified no interval changes in medical condition and plan of care since the history and physical of record  

## 2015-03-25 NOTE — Anesthesia Postprocedure Evaluation (Signed)
  Anesthesia Post-op Note  Patient: [name]  Procedure(s) Performed: Procedure(s): CATARACT EXTRACTION PHACO AND INTRAOCULAR LENS PLACEMENT RIGHT EYE CDE=12.24 (Right)  Patient Location: Short Stay  Anesthesia Type:MAC  Level of Consciousness: awake, alert , oriented and patient cooperative  Airway and Oxygen Therapy: Patient Spontanous Breathing  Post-op Pain: none  Post-op Assessment: Post-op Vital signs reviewed, Patient's Cardiovascular Status Stable, Respiratory Function Stable, Patent Airway, No signs of Nausea or vomiting and Pain level controlled              Post-op Vital Signs: Reviewed and stable  Last Vitals:  Filed Vitals:   03/25/15 0820  BP: 138/86  Pulse:   Temp:   Resp: 23    Complications: No apparent anesthesia complications

## 2015-03-25 NOTE — Discharge Instructions (Signed)

## 2015-03-25 NOTE — Anesthesia Preprocedure Evaluation (Signed)
Anesthesia Evaluation  Patient identified by MRN, date of birth, ID band Patient awake    Reviewed: Allergy & Precautions, H&P , NPO status , Patient's Chart, lab work & pertinent test results  Airway Mallampati: III  TM Distance: >3 FB Neck ROM: full    Dental  (+) Edentulous Upper, Poor Dentition, Dental Advisory Given   Pulmonary sleep apnea and Continuous Positive Airway Pressure Ventilation ,  breath sounds clear to auscultation  Pulmonary exam normal       Cardiovascular Exercise Tolerance: Poor hypertension, Pt. on medications + Peripheral Vascular Disease and +CHF + dysrhythmias Atrial Fibrillation + Cardiac Defibrillator Rhythm:Irregular Rate:Normal  Bifascicular block.  EF 20 - 25% echo 5/13. Non-ischemic cardiomyopathy.  Pulmonary hypertension. Incomplete LBBB.  ECG 2/28  AF with RVR   Neuro/Psych negative neurological ROS  negative psych ROS   GI/Hepatic negative GI ROS, Neg liver ROS,   Endo/Other  diabetes, Well Controlled, Type 2, Oral Hypoglycemic Agents, Insulin Dependent  Renal/GU Renal InsufficiencyRenal disease  negative genitourinary   Musculoskeletal   Abdominal   Peds  Hematology negative hematology ROS (+) anemia , hgb 10.2   Anesthesia Other Findings   Reproductive/Obstetrics negative OB ROS                             Anesthesia Physical Anesthesia Plan  ASA: IV  Anesthesia Plan: MAC   Post-op Pain Management:    Induction: Intravenous  Airway Management Planned: Nasal Cannula  Additional Equipment:   Intra-op Plan:   Post-operative Plan:   Informed Consent: I have reviewed the patients History and Physical, chart, labs and discussed the procedure including the risks, benefits and alternatives for the proposed anesthesia with the patient or authorized representative who has indicated his/her understanding and acceptance.     Plan Discussed with:    Anesthesia Plan Comments:         Anesthesia Quick Evaluation

## 2015-03-25 NOTE — Transfer of Care (Signed)
Immediate Anesthesia Transfer of Care Note  Patient: [name]  Procedure(s) Performed: Procedure(s): CATARACT EXTRACTION PHACO AND INTRAOCULAR LENS PLACEMENT RIGHT EYE CDE=12.24 (Right)  Patient Location: Short Stay  Anesthesia Type:MAC  Level of Consciousness: awake, alert , oriented and patient cooperative  Airway & Oxygen Therapy: Patient Spontanous Breathing  Post-op Assessment: Report given to RN, Post -op Vital signs reviewed and stable and Patient moving all extremities  Post vital signs: Reviewed and stable  Last Vitals:  Filed Vitals:   03/25/15 0820  BP: 138/86  Pulse:   Temp:   Resp: 23    Complications: No apparent anesthesia complications

## 2015-03-25 NOTE — Op Note (Signed)
Date of Admission: 03/25/2015  Date of Surgery: 03/25/2015   Pre-Op Dx: Cataract Right Eye  Post-Op Dx: Senile Combined Cataract Right  Eye,  Dx Code N98.921  Surgeon: Tonny Branch, M.D.  Assistants: None  Anesthesia: Topical with MAC  Indications: Painless, progressive loss of vision with compromise of daily activities.  Surgery: Cataract Extraction with Intraocular lens Implant Right Eye  Discription: The patient had dilating drops and viscous lidocaine placed into the Right eye in the pre-op holding area. After transfer to the operating room, a time out was performed. The patient was then prepped and draped. Beginning with a 30 degree blade a paracentesis port was made at the surgeon's 2 o'clock position. The anterior chamber was then filled with 1% non-preserved lidocaine. This was followed by filling the anterior chamber with Provisc.  A 2.43mm keratome blade was used to make a clear corneal incision at the temporal limbus.  A bent cystatome needle was used to create a continuous tear capsulotomy. Hydrodissection was performed with balanced salt solution on a Fine canula. The lens nucleus was then removed using the phacoemulsification handpiece. Residual cortex was removed with the I&A handpiece. The anterior chamber and capsular bag were refilled with Provisc. A posterior chamber intraocular lens was placed into the capsular bag with it's injector. The implant was positioned with the Kuglan hook. The Provisc was then removed from the anterior chamber and capsular bag with the I&A handpiece. Stromal hydration of the main incision and paracentesis port was performed with BSS on a Fine canula. The wounds were tested for leak which was negative. The patient tolerated the procedure well. There were no operative complications. The patient was then transferred to the recovery room in stable condition.  Complications: None  Specimen: None  EBL: None  Prosthetic device: Hoya iSert 250, power 19.5 D,  SN [id number].

## 2015-03-26 ENCOUNTER — Encounter (HOSPITAL_COMMUNITY): Payer: Self-pay | Admitting: Ophthalmology

## 2015-04-01 ENCOUNTER — Ambulatory Visit (INDEPENDENT_AMBULATORY_CARE_PROVIDER_SITE_OTHER): Payer: Medicare Other | Admitting: *Deleted

## 2015-04-01 DIAGNOSIS — I519 Heart disease, unspecified: Secondary | ICD-10-CM | POA: Diagnosis not present

## 2015-04-01 DIAGNOSIS — Z95810 Presence of automatic (implantable) cardiac defibrillator: Secondary | ICD-10-CM

## 2015-04-03 ENCOUNTER — Telehealth: Payer: Self-pay

## 2015-04-03 NOTE — Progress Notes (Signed)
EPIC Encounter for ICM Monitoring  Patient Name: [name] is a 54 y.o. male Date: 04/03/2015 Primary Care Physican: [name], MD Primary Cardiologist: Bronson Ing Electrophysiologist: Allred Dry Weight: 260 lbs       Bi-V Pacing 98.3%  In the past month, have you: 1. Gained more than 2 pounds in a day or more than 5 pounds in a week? Yes, office weight on 02/27/2015 was 273 lbs and 271lbs at office visit on 03/13/2015.  Todays weight was 260 lbs.    2. Had changes in your medications (with verification of current medications)? Yes. Patient saw [name] on 02/27/2015 Torsemide 40mg  increased to BID x 4 days and Metolazone 5mg  daily x 4 days.  Saw Ermalinda Barrios, PA-C on 03/13/2015 and she increased Demadex to 40mg  am and 20mg  in pm for continued weight increase.  Patient reported he continues with the plan for Demadex bid as prescribed.   3. Had more shortness of breath than is usual for you? no  4. Limited your activity because of shortness of breath? no  5. Not been able to sleep because of shortness of breath? no  6. Had increased swelling in your feet or ankles? Yes, continued leg swelling but has improved since last office visit on 03/13/2015.   7. Had symptoms of dehydration (dizziness, dry mouth, increased thirst, decreased urine output) no  8. Had changes in sodium restriction? No, he reported he is following sodium restriction.  9. Been compliant with medication? Yes   ICM trend:   Follow-up plan: ICM clinic phone appointment 05/06/2015. Patient reported he is feeling better since his last office visit on 03/13/2015.  Thoracic impedance showed below reference line in last 2 weeks but denied any worsening of symptoms.  He denied any breathing difficulties but continues to have leg swelling with improvement.  He stated he dieting to decrease weight.   No changes today.     Copy of note sent to patient's primary care physician, primary cardiologist, and device following  physician.  Rosalene Billings, RN, CCM 04/03/2015 11:16 AM

## 2015-04-03 NOTE — Telephone Encounter (Signed)
ICM transmission received.  Attempted call to patient at home and cell and left message for return call.

## 2015-04-04 NOTE — Telephone Encounter (Signed)
Spoke with patient.

## 2015-04-12 ENCOUNTER — Ambulatory Visit (INDEPENDENT_AMBULATORY_CARE_PROVIDER_SITE_OTHER): Payer: Medicare Other | Admitting: Urology

## 2015-04-12 DIAGNOSIS — N3289 Other specified disorders of bladder: Secondary | ICD-10-CM | POA: Diagnosis not present

## 2015-04-12 DIAGNOSIS — N481 Balanitis: Secondary | ICD-10-CM

## 2015-04-16 ENCOUNTER — Ambulatory Visit (INDEPENDENT_AMBULATORY_CARE_PROVIDER_SITE_OTHER): Payer: Medicare Other | Admitting: Cardiovascular Disease

## 2015-04-16 ENCOUNTER — Encounter: Payer: Self-pay | Admitting: Cardiovascular Disease

## 2015-04-16 VITALS — BP 130/60 | HR 69 | Ht 71.0 in | Wt 269.0 lb

## 2015-04-16 DIAGNOSIS — I5022 Chronic systolic (congestive) heart failure: Secondary | ICD-10-CM | POA: Diagnosis not present

## 2015-04-16 DIAGNOSIS — I10 Essential (primary) hypertension: Secondary | ICD-10-CM

## 2015-04-16 DIAGNOSIS — I428 Other cardiomyopathies: Secondary | ICD-10-CM

## 2015-04-16 DIAGNOSIS — I429 Cardiomyopathy, unspecified: Secondary | ICD-10-CM | POA: Diagnosis not present

## 2015-04-16 DIAGNOSIS — Z7901 Long term (current) use of anticoagulants: Secondary | ICD-10-CM

## 2015-04-16 DIAGNOSIS — Z95810 Presence of automatic (implantable) cardiac defibrillator: Secondary | ICD-10-CM | POA: Diagnosis not present

## 2015-04-16 DIAGNOSIS — I4891 Unspecified atrial fibrillation: Secondary | ICD-10-CM

## 2015-04-16 DIAGNOSIS — Z79899 Other long term (current) drug therapy: Secondary | ICD-10-CM

## 2015-04-16 NOTE — Progress Notes (Signed)
Patient ID: [name], male   DOB: 1960/12/26, 54 y.o.   MRN: [id number]      SUBJECTIVE: The patient returns for follow up for chronic systolic heart failure and has a CRT-D system in place. Echocardiogram performed on 04/24/14 demonstrated moderate to severely reduced left ventricular systolic function, EF 30%, grade 3 diastolic dysfunction and aortic sclerosis but no significant stenosis.  Device interrogation on 01/25/15 demonstrated iIncreased Optivol readings with subsequent torsemide adjustments made by Dr. [name].  Weight up to 269 lbs (271 lbs on 03/13/15).  Saw Gerrianne Scale PA-C on 7/27. Blood work on 8/2 showed BUN 19, creatinine 1.04.  Says "I feel great". Denies shortness of breath. Believes he is at his dry weight as he only lost 30-40 lbs when he was hospitalized.  Seldom has palpitations.   Review of Systems: As per "subjective", otherwise negative.  No Known Allergies  Current Outpatient Prescriptions  Medication Sig Dispense Refill  . allopurinol (ZYLOPRIM) 100 MG tablet Take 1 tablet (100 mg total) by mouth daily. 30 tablet 1  . atorvastatin (LIPITOR) 10 MG tablet Take 10 mg by mouth daily.      . Canagliflozin (INVOKANA) 300 MG TABS Take 300 mg by mouth daily.    . cloNIDine (CATAPRES) 0.2 MG tablet Take 0.2 mg by mouth 2 (two) times daily.      [name] COLCRYS 0.6 MG tablet Take 0.6 mg by mouth daily as needed (gout).     . dabigatran (PRADAXA) 150 MG CAPS capsule Take 1 capsule (150 mg total) by mouth 2 (two) times daily. 60 capsule 6  . digoxin (LANOXIN) 0.25 MG tablet Take 0.125 mg by mouth daily.    [name] gemfibrozil (LOPID) 600 MG tablet Take 600 mg by mouth 2 (two) times daily before a meal.      . insulin detemir (LEVEMIR) 100 UNIT/ML injection Inject 0.5 mLs (50 Units total) into the skin 2 (two) times daily. 10 mL 1  . loperamide (IMODIUM) 2 MG capsule Take 1 capsule (2 mg total) by mouth as needed for diarrhea or loose stools. 30 capsule 0  . metoprolol  (LOPRESSOR) 50 MG tablet Take 1.5 tablets (75 mg total) by mouth 2 (two) times daily. 180 tablet 3  . prazosin (MINIPRESS) 1 MG capsule TAKE ONE CAPSULE BY MOUTH TWICE DAILY 60 capsule 6  . sitaGLIPtin (JANUVIA) 100 MG tablet Take 100 mg by mouth daily.    [name] spironolactone (ALDACTONE) 25 MG tablet Take 0.5 tablets (12.5 mg total) by mouth daily. 45 tablet 3  . torsemide (DEMADEX) 20 MG tablet Take 1 tablet (20 mg total) by mouth as directed. Starting today 03/06/15 TAKE TWO TABLETS (40 MG) TOTAL  IN THE AM AND ONE TABLET (20 MG) TOTAL IN THE EVENING 90 tablet 6  . valsartan (DIOVAN) 80 MG tablet Take 1 tablet (80 mg total) by mouth daily. 90 tablet 3   No current facility-administered medications for this visit.    Past Medical History  Diagnosis Date  . Hypertension   . Morbid obesity   . Atrial flutter  10/01/2006, 12/30/11    Status post RF ablation, 7/13, Dr. [name]  . OSA (obstructive sleep apnea)     compliant with CPAP  . Nonischemic cardiomyopathy     Normal coronary arteries, 2008  . Chronic systolic dysfunction of left ventricle     EF 20-25%, global HK; mild RVD, 2-D echo, 5/13  . Unspecified disorder of kidney and ureter   . RBBB, anterior fascicular  block and incomplete LBBB   . Pulmonary hypertension     RVSP 50-55 mmHg, 5/13  . Valvular heart disease     Mild MR/TR, 2-D echo, 5/13  . Type II or unspecified type diabetes mellitus without mention of complication, not stated as uncontrolled     Past Surgical History  Procedure Laterality Date  . Bi-ventricular implantable cardioverter defibrillator  (crt-d)  10/27/2013    MDT [name] XT CRTD implanted by [name] for NICM, CHF  . Atrial flutter ablation N/A 03/04/2012    Procedure: ATRIAL FLUTTER ABLATION;  Surgeon: Thompson Grayer, MD;  Location: [facility] CATH LAB;  Service: Cardiovascular;  Laterality: N/A;  . Bi-ventricular implantable cardioverter defibrillator N/A 10/27/2013    Procedure: BI-VENTRICULAR IMPLANTABLE  CARDIOVERTER DEFIBRILLATOR  (CRT-D);  Surgeon: Coralyn Mark, MD;  Location: [facility] CATH LAB;  Service: Cardiovascular;  Laterality: N/A;  . Cystoscopy/retrograde/ureteroscopy Bilateral 10/16/2014    Procedure: Kathrene Alu WITH BILATERAL Freddie Breech WITH GYRUS;  Surgeon: Malka So, MD;  Location: [facility] ORS;  Service: Urology;  Laterality: Bilateral;  . Transurethral resection of bladder tumor with gyrus (turbt-gyrus) N/A 10/16/2014    Procedure: TRANSURETHRAL RESECTION OF BLADDER TUMOR WITH GYRUS (TURBT-GYRUS);  Surgeon: Malka So, MD;  Location: [facility] ORS;  Service: Urology;  Laterality: N/A;  . Cataract extraction w/phaco Right 03/25/2015    Procedure: CATARACT EXTRACTION PHACO AND INTRAOCULAR LENS PLACEMENT RIGHT EYE CDE=12.24;  Surgeon: Tonny Branch, MD;  Location: [facility] ORS;  Service: Ophthalmology;  Laterality: Right;    Social History   Social History  . Marital Status: Married    Spouse Name: N/A  . Number of Children: N/A  . Years of Education: N/A   Occupational History  . DISABLED    Social History Main Topics  . Smoking status: Never Smoker   . Smokeless tobacco: Never Used  . Alcohol Use: No  . Drug Use: No  . Sexual Activity: Not on file   Other Topics Concern  . Not on file   Social History Narrative   Pt lives in [place] alone.  Disabled.  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     Filed Vitals:   04/16/15 1102  BP: 130/60  Pulse: 69  Height: 5\' 11"  (1.803 m)  Weight: 269 lb (122.018 kg)  SpO2: 97%    PHYSICAL EXAM General: NAD HEENT: Normal. Neck: No JVD, no thyromegaly. Lungs: Clear to auscultation bilaterally with normal respiratory effort. CV: Regular rate and rhythm, normal S1/S2, no Z0/Y1, 1/6 systolic murmur along left sternal border and over RUSB. Right foot cast. Bilateral trivial pretibial edema. Abdomen: Soft, nontender, obese, mild distention.  Neurologic: Alert and oriented x 3.  Psych: Normal affect. Skin: Normal. Musculoskeletal:  Right foot cast.  ECG: Most recent ECG reviewed.      ASSESSMENT AND PLAN: 1. Chronic systolic heart failure/nonischemic cardiomyopathy s/p CRTD, EF 35%: Device interrogation on 6/10 showed elevated Optivol readings with subsequent torsemide adjustments made by Dr. [name].  Currently euvolemic. I believe he is at his dry weight. Taking torsemide 40 mg q am and 20 mg q pm. No changes.  2. Essential HTN: Controlled. No changes. On Diovan 80 mg daily.  3. Aortic sclerosis: Echo results noted above with aortic valve sclerosis without stenosis.   4. Atrial fibrillation: HR controlled and CRT-D in place. Continue Pradaxa. No bleeding problems.  Dispo: f/u 4 months.  Kate Sable, M.D., F.A.C.C.

## 2015-04-16 NOTE — Patient Instructions (Signed)
Your physician recommends that you continue on your current medications as directed. Please refer to the Current Medication list given to you today. Your physician recommends that you schedule a follow-up appointment in: 4 months. You will receive a reminder letter in the mail in about 2 months reminding you to call and schedule your appointment. If you don't receive this letter, please contact our office. 

## 2015-04-30 ENCOUNTER — Encounter: Payer: Self-pay | Admitting: Vascular Surgery

## 2015-04-30 ENCOUNTER — Other Ambulatory Visit (HOSPITAL_COMMUNITY): Payer: Self-pay | Admitting: Orthopedic Surgery

## 2015-05-01 ENCOUNTER — Other Ambulatory Visit (HOSPITAL_COMMUNITY): Payer: Self-pay | Admitting: Orthopedic Surgery

## 2015-05-01 ENCOUNTER — Other Ambulatory Visit: Payer: Self-pay | Admitting: *Deleted

## 2015-05-01 ENCOUNTER — Inpatient Hospital Stay (HOSPITAL_COMMUNITY): Admission: RE | Admit: 2015-05-01 | Payer: Medicare Other | Source: Ambulatory Visit

## 2015-05-01 DIAGNOSIS — L97311 Non-pressure chronic ulcer of right ankle limited to breakdown of skin: Secondary | ICD-10-CM

## 2015-05-02 ENCOUNTER — Encounter: Payer: Medicare Other | Admitting: Vascular Surgery

## 2015-05-06 ENCOUNTER — Encounter: Payer: Self-pay | Admitting: Internal Medicine

## 2015-05-06 ENCOUNTER — Telehealth: Payer: Self-pay

## 2015-05-06 ENCOUNTER — Ambulatory Visit (INDEPENDENT_AMBULATORY_CARE_PROVIDER_SITE_OTHER): Payer: Medicare Other | Admitting: *Deleted

## 2015-05-06 DIAGNOSIS — I429 Cardiomyopathy, unspecified: Secondary | ICD-10-CM

## 2015-05-06 NOTE — Telephone Encounter (Signed)
ICM transmission received.  Attempted cell and home phone number.  Left message for return call.

## 2015-05-06 NOTE — Telephone Encounter (Signed)
Attempted call to patient and no answer. 

## 2015-05-07 NOTE — Progress Notes (Signed)
Remote ICD transmission.   

## 2015-05-08 NOTE — Telephone Encounter (Signed)
Attempted call to patient and message left for return call.

## 2015-05-08 NOTE — Telephone Encounter (Signed)
Patient left voice mail returning call.  Attempted return call to both home and mobile, no answer.

## 2015-05-09 NOTE — Progress Notes (Signed)
Next ICM transmission date changed to 05/27/2015.

## 2015-05-09 NOTE — Telephone Encounter (Signed)
Unable to reach member for monthly ICM.   Will attempt follow up on 05/22/2015 and sent letter to patient with scheduled transmission date and to call if he is experiencing any HF symptoms.

## 2015-05-14 LAB — CUP PACEART REMOTE DEVICE CHECK
Brady Statistic AP VS Percent: 0.08 %
Brady Statistic AS VP Percent: 86.98 %
Brady Statistic RA Percent Paced: 7.39 %
Brady Statistic RV Percent Paced: 90.53 %
Date Time Interrogation Session: 20160919062724
HighPow Impedance: 52 Ohm
Lead Channel Impedance Value: 361 Ohm
Lead Channel Impedance Value: 361 Ohm
Lead Channel Impedance Value: 399 Ohm
Lead Channel Impedance Value: 456 Ohm
Lead Channel Impedance Value: 456 Ohm
Lead Channel Impedance Value: 551 Ohm
Lead Channel Impedance Value: 646 Ohm
Lead Channel Impedance Value: 722 Ohm
Lead Channel Impedance Value: 760 Ohm
Lead Channel Impedance Value: 760 Ohm
Lead Channel Pacing Threshold Amplitude: 0.875 V
Lead Channel Pacing Threshold Amplitude: 1.5 V
Lead Channel Pacing Threshold Pulse Width: 0.4 ms
Lead Channel Pacing Threshold Pulse Width: 0.4 ms
Lead Channel Sensing Intrinsic Amplitude: 3.625 mV
Lead Channel Sensing Intrinsic Amplitude: 3.625 mV
Lead Channel Sensing Intrinsic Amplitude: 9.125 mV
Lead Channel Setting Pacing Amplitude: 2 V
Lead Channel Setting Pacing Pulse Width: 0.4 ms
Lead Channel Setting Sensing Sensitivity: 0.3 mV
MDC IDC MSMT BATTERY REMAINING LONGEVITY: 77 mo
MDC IDC MSMT BATTERY VOLTAGE: 2.99 V
MDC IDC MSMT LEADCHNL LV IMPEDANCE VALUE: 418 Ohm
MDC IDC MSMT LEADCHNL LV IMPEDANCE VALUE: 646 Ohm
MDC IDC MSMT LEADCHNL LV PACING THRESHOLD PULSEWIDTH: 0.4 ms
MDC IDC MSMT LEADCHNL RV IMPEDANCE VALUE: 285 Ohm
MDC IDC MSMT LEADCHNL RV PACING THRESHOLD AMPLITUDE: 1.25 V
MDC IDC MSMT LEADCHNL RV SENSING INTR AMPL: 9.125 mV
MDC IDC SET LEADCHNL LV PACING PULSEWIDTH: 0.4 ms
MDC IDC SET LEADCHNL RA PACING AMPLITUDE: 3 V
MDC IDC SET LEADCHNL RV PACING AMPLITUDE: 2.5 V
MDC IDC SET ZONE DETECTION INTERVAL: 350 ms
MDC IDC STAT BRADY AP VP PERCENT: 7.31 %
MDC IDC STAT BRADY AS VS PERCENT: 5.62 %
Zone Setting Detection Interval: 300 ms
Zone Setting Detection Interval: 360 ms
Zone Setting Detection Interval: 400 ms

## 2015-05-20 ENCOUNTER — Inpatient Hospital Stay (HOSPITAL_COMMUNITY)
Admission: AD | Admit: 2015-05-20 | Discharge: 2015-05-23 | DRG: 475 | Disposition: A | Discharge status: Rehabilitation Facility | Payer: Medicare Other | Source: Other Acute Inpatient Hospital | Attending: Internal Medicine | Admitting: Internal Medicine

## 2015-05-20 ENCOUNTER — Encounter (HOSPITAL_COMMUNITY): Payer: Self-pay | Admitting: *Deleted

## 2015-05-20 ENCOUNTER — Inpatient Hospital Stay (HOSPITAL_COMMUNITY): Payer: Medicare Other

## 2015-05-20 DIAGNOSIS — I519 Heart disease, unspecified: Secondary | ICD-10-CM | POA: Diagnosis not present

## 2015-05-20 DIAGNOSIS — E1310 Other specified diabetes mellitus with ketoacidosis without coma: Secondary | ICD-10-CM | POA: Diagnosis not present

## 2015-05-20 DIAGNOSIS — Z89511 Acquired absence of right leg below knee: Secondary | ICD-10-CM | POA: Diagnosis not present

## 2015-05-20 DIAGNOSIS — I4892 Unspecified atrial flutter: Secondary | ICD-10-CM | POA: Diagnosis present

## 2015-05-20 DIAGNOSIS — Z95810 Presence of automatic (implantable) cardiac defibrillator: Secondary | ICD-10-CM

## 2015-05-20 DIAGNOSIS — E785 Hyperlipidemia, unspecified: Secondary | ICD-10-CM | POA: Diagnosis not present

## 2015-05-20 DIAGNOSIS — B964 Proteus (mirabilis) (morganii) as the cause of diseases classified elsewhere: Secondary | ICD-10-CM | POA: Diagnosis present

## 2015-05-20 DIAGNOSIS — I429 Cardiomyopathy, unspecified: Secondary | ICD-10-CM | POA: Diagnosis not present

## 2015-05-20 DIAGNOSIS — I10 Essential (primary) hypertension: Secondary | ICD-10-CM | POA: Diagnosis not present

## 2015-05-20 DIAGNOSIS — Z7901 Long term (current) use of anticoagulants: Secondary | ICD-10-CM

## 2015-05-20 DIAGNOSIS — I130 Hypertensive heart and chronic kidney disease with heart failure and stage 1 through stage 4 chronic kidney disease, or unspecified chronic kidney disease: Secondary | ICD-10-CM | POA: Diagnosis present

## 2015-05-20 DIAGNOSIS — N179 Acute kidney failure, unspecified: Secondary | ICD-10-CM | POA: Diagnosis present

## 2015-05-20 DIAGNOSIS — E11621 Type 2 diabetes mellitus with foot ulcer: Secondary | ICD-10-CM | POA: Diagnosis present

## 2015-05-20 DIAGNOSIS — G4733 Obstructive sleep apnea (adult) (pediatric): Secondary | ICD-10-CM | POA: Diagnosis not present

## 2015-05-20 DIAGNOSIS — Z794 Long term (current) use of insulin: Secondary | ICD-10-CM | POA: Diagnosis not present

## 2015-05-20 DIAGNOSIS — E1152 Type 2 diabetes mellitus with diabetic peripheral angiopathy with gangrene: Secondary | ICD-10-CM | POA: Diagnosis present

## 2015-05-20 DIAGNOSIS — E1165 Type 2 diabetes mellitus with hyperglycemia: Secondary | ICD-10-CM | POA: Diagnosis present

## 2015-05-20 DIAGNOSIS — I272 Other secondary pulmonary hypertension: Secondary | ICD-10-CM | POA: Diagnosis present

## 2015-05-20 DIAGNOSIS — Z6834 Body mass index (BMI) 34.0-34.9, adult: Secondary | ICD-10-CM

## 2015-05-20 DIAGNOSIS — E1169 Type 2 diabetes mellitus with other specified complication: Secondary | ICD-10-CM | POA: Diagnosis present

## 2015-05-20 DIAGNOSIS — Z8679 Personal history of other diseases of the circulatory system: Secondary | ICD-10-CM | POA: Diagnosis not present

## 2015-05-20 DIAGNOSIS — M25512 Pain in left shoulder: Secondary | ICD-10-CM | POA: Diagnosis not present

## 2015-05-20 DIAGNOSIS — I110 Hypertensive heart disease with heart failure: Secondary | ICD-10-CM | POA: Diagnosis present

## 2015-05-20 DIAGNOSIS — L97519 Non-pressure chronic ulcer of other part of right foot with unspecified severity: Secondary | ICD-10-CM | POA: Diagnosis present

## 2015-05-20 DIAGNOSIS — E1142 Type 2 diabetes mellitus with diabetic polyneuropathy: Secondary | ICD-10-CM | POA: Diagnosis not present

## 2015-05-20 DIAGNOSIS — D62 Acute posthemorrhagic anemia: Secondary | ICD-10-CM | POA: Diagnosis not present

## 2015-05-20 DIAGNOSIS — M8660 Other chronic osteomyelitis, unspecified site: Secondary | ICD-10-CM | POA: Diagnosis present

## 2015-05-20 DIAGNOSIS — N189 Chronic kidney disease, unspecified: Secondary | ICD-10-CM | POA: Diagnosis not present

## 2015-05-20 DIAGNOSIS — I482 Chronic atrial fibrillation: Secondary | ICD-10-CM | POA: Diagnosis not present

## 2015-05-20 DIAGNOSIS — E1110 Type 2 diabetes mellitus with ketoacidosis without coma: Secondary | ICD-10-CM | POA: Diagnosis present

## 2015-05-20 DIAGNOSIS — E1122 Type 2 diabetes mellitus with diabetic chronic kidney disease: Secondary | ICD-10-CM | POA: Diagnosis not present

## 2015-05-20 DIAGNOSIS — I4891 Unspecified atrial fibrillation: Secondary | ICD-10-CM | POA: Diagnosis not present

## 2015-05-20 DIAGNOSIS — M86171 Other acute osteomyelitis, right ankle and foot: Secondary | ICD-10-CM | POA: Diagnosis present

## 2015-05-20 DIAGNOSIS — Z6835 Body mass index (BMI) 35.0-35.9, adult: Secondary | ICD-10-CM

## 2015-05-20 DIAGNOSIS — M86671 Other chronic osteomyelitis, right ankle and foot: Secondary | ICD-10-CM | POA: Diagnosis not present

## 2015-05-20 DIAGNOSIS — I5022 Chronic systolic (congestive) heart failure: Secondary | ICD-10-CM | POA: Diagnosis present

## 2015-05-20 DIAGNOSIS — L97513 Non-pressure chronic ulcer of other part of right foot with necrosis of muscle: Secondary | ICD-10-CM | POA: Diagnosis not present

## 2015-05-20 DIAGNOSIS — Z79899 Other long term (current) drug therapy: Secondary | ICD-10-CM | POA: Diagnosis not present

## 2015-05-20 DIAGNOSIS — M7552 Bursitis of left shoulder: Secondary | ICD-10-CM | POA: Diagnosis not present

## 2015-05-20 DIAGNOSIS — M79671 Pain in right foot: Secondary | ICD-10-CM | POA: Diagnosis present

## 2015-05-20 DIAGNOSIS — M7542 Impingement syndrome of left shoulder: Secondary | ICD-10-CM | POA: Diagnosis not present

## 2015-05-20 DIAGNOSIS — M869 Osteomyelitis, unspecified: Secondary | ICD-10-CM

## 2015-05-20 LAB — CBC
HCT: 29.2 % — ABNORMAL LOW (ref 39.0–52.0)
Hemoglobin: 9.3 g/dL — ABNORMAL LOW (ref 13.0–17.0)
MCH: 25.9 pg — AB (ref 26.0–34.0)
MCHC: 31.8 g/dL (ref 30.0–36.0)
MCV: 81.3 fL (ref 78.0–100.0)
PLATELETS: 297 10*3/uL (ref 150–400)
RBC: 3.59 MIL/uL — ABNORMAL LOW (ref 4.22–5.81)
RDW: 15.3 % (ref 11.5–15.5)
WBC: 6.7 10*3/uL (ref 4.0–10.5)

## 2015-05-20 LAB — COMPREHENSIVE METABOLIC PANEL
ALT: 16 U/L — ABNORMAL LOW (ref 17–63)
ANION GAP: 10 (ref 5–15)
AST: 20 U/L (ref 15–41)
Albumin: 1.5 g/dL — ABNORMAL LOW (ref 3.5–5.0)
Alkaline Phosphatase: 174 U/L — ABNORMAL HIGH (ref 38–126)
BUN: 34 mg/dL — ABNORMAL HIGH (ref 6–20)
CHLORIDE: 101 mmol/L (ref 101–111)
CO2: 25 mmol/L (ref 22–32)
Calcium: 8.3 mg/dL — ABNORMAL LOW (ref 8.9–10.3)
Creatinine, Ser: 1.58 mg/dL — ABNORMAL HIGH (ref 0.61–1.24)
GFR, EST AFRICAN AMERICAN: 55 mL/min — AB (ref >60)
GFR, EST NON AFRICAN AMERICAN: 48 mL/min — AB (ref >60)
Glucose, Bld: 213 mg/dL — ABNORMAL HIGH (ref 65–99)
Potassium: 3.6 mmol/L (ref 3.5–5.1)
SODIUM: 136 mmol/L (ref 135–145)
Total Bilirubin: 1 mg/dL (ref 0.3–1.2)
Total Protein: 5.9 g/dL — ABNORMAL LOW (ref 6.5–8.1)

## 2015-05-20 LAB — GLUCOSE, CAPILLARY
GLUCOSE-CAPILLARY: 194 mg/dL — AB (ref 65–99)
GLUCOSE-CAPILLARY: 213 mg/dL — AB (ref 65–99)

## 2015-05-20 MED ORDER — INSULIN DETEMIR 100 UNIT/ML ~~LOC~~ SOLN
50.0000 [IU] | Freq: Two times a day (BID) | SUBCUTANEOUS | Status: DC
  Filled 2015-05-20: qty 0.5

## 2015-05-20 MED ORDER — GEMFIBROZIL 600 MG PO TABS
600.0000 mg | ORAL_TABLET | Freq: Two times a day (BID) | ORAL | Status: DC
  Administered 2015-05-22 – 2015-05-23 (×3): 600 mg via ORAL
  Filled 2015-05-20 (×7): qty 1

## 2015-05-20 MED ORDER — TORSEMIDE 20 MG PO TABS
20.0000 mg | ORAL_TABLET | Freq: Two times a day (BID) | ORAL | Status: DC
  Administered 2015-05-20 – 2015-05-23 (×5): 20 mg via ORAL
  Filled 2015-05-20 (×5): qty 1

## 2015-05-20 MED ORDER — ENSURE ENLIVE PO LIQD
237.0000 mL | Freq: Two times a day (BID) | ORAL | Status: DC
  Administered 2015-05-22: 237 mL via ORAL

## 2015-05-20 MED ORDER — VANCOMYCIN HCL 500 MG IV SOLR
500.0000 mg | Freq: Two times a day (BID) | INTRAVENOUS | Status: DC
  Filled 2015-05-20: qty 500

## 2015-05-20 MED ORDER — ATORVASTATIN CALCIUM 10 MG PO TABS
10.0000 mg | ORAL_TABLET | Freq: Every morning | ORAL | Status: DC
  Administered 2015-05-21 – 2015-05-23 (×3): 10 mg via ORAL
  Filled 2015-05-20 (×3): qty 1

## 2015-05-20 MED ORDER — DIGOXIN 125 MCG PO TABS
0.1250 mg | ORAL_TABLET | Freq: Every day | ORAL | Status: DC
  Administered 2015-05-21 – 2015-05-23 (×3): 0.125 mg via ORAL
  Filled 2015-05-20 (×3): qty 1

## 2015-05-20 MED ORDER — VANCOMYCIN HCL IN DEXTROSE 750-5 MG/150ML-% IV SOLN
750.0000 mg | Freq: Three times a day (TID) | INTRAVENOUS | Status: DC
  Filled 2015-05-20 (×2): qty 150

## 2015-05-20 MED ORDER — IRBESARTAN 75 MG PO TABS
75.0000 mg | ORAL_TABLET | Freq: Every day | ORAL | Status: DC
  Filled 2015-05-20: qty 1

## 2015-05-20 MED ORDER — ALLOPURINOL 300 MG PO TABS
300.0000 mg | ORAL_TABLET | Freq: Every day | ORAL | Status: DC
  Administered 2015-05-21 – 2015-05-23 (×3): 300 mg via ORAL
  Filled 2015-05-20 (×3): qty 1

## 2015-05-20 MED ORDER — ONDANSETRON HCL 4 MG PO TABS: 4.0000 mg | ORAL_TABLET | Freq: Four times a day (QID) | ORAL | Status: DC | PRN

## 2015-05-20 MED ORDER — VANCOMYCIN HCL IN DEXTROSE 750-5 MG/150ML-% IV SOLN
750.0000 mg | Freq: Two times a day (BID) | INTRAVENOUS | Status: DC
  Administered 2015-05-20 – 2015-05-22 (×5): 750 mg via INTRAVENOUS
  Filled 2015-05-20 (×8): qty 150

## 2015-05-20 MED ORDER — INSULIN ASPART 100 UNIT/ML ~~LOC~~ SOLN
0.0000 [IU] | Freq: Three times a day (TID) | SUBCUTANEOUS | Status: DC
  Administered 2015-05-22 (×2): 5 [IU] via SUBCUTANEOUS
  Administered 2015-05-22: 3 [IU] via SUBCUTANEOUS

## 2015-05-20 MED ORDER — INSULIN ASPART 100 UNIT/ML ~~LOC~~ SOLN
0.0000 [IU] | Freq: Every day | SUBCUTANEOUS | Status: DC
  Administered 2015-05-20: 2 [IU] via SUBCUTANEOUS

## 2015-05-20 MED ORDER — INSULIN DETEMIR 100 UNIT/ML ~~LOC~~ SOLN
50.0000 [IU] | Freq: Every day | SUBCUTANEOUS | Status: DC
  Administered 2015-05-20: 50 [IU] via SUBCUTANEOUS
  Filled 2015-05-20 (×3): qty 0.5

## 2015-05-20 MED ORDER — INSULIN ASPART 100 UNIT/ML ~~LOC~~ SOLN
4.0000 [IU] | Freq: Three times a day (TID) | SUBCUTANEOUS | Status: DC
  Administered 2015-05-22 (×2): 4 [IU] via SUBCUTANEOUS

## 2015-05-20 MED ORDER — METOPROLOL TARTRATE 50 MG PO TABS
75.0000 mg | ORAL_TABLET | Freq: Two times a day (BID) | ORAL | Status: DC
  Administered 2015-05-20 – 2015-05-23 (×6): 75 mg via ORAL
  Filled 2015-05-20 (×12): qty 1

## 2015-05-20 MED ORDER — CLONIDINE HCL 0.2 MG PO TABS
0.2000 mg | ORAL_TABLET | Freq: Two times a day (BID) | ORAL | Status: DC
  Administered 2015-05-20 – 2015-05-23 (×6): 0.2 mg via ORAL
  Filled 2015-05-20 (×6): qty 1

## 2015-05-20 MED ORDER — HYDROMORPHONE HCL 1 MG/ML IJ SOLN: 1.0000 mg | INTRAMUSCULAR | Status: DC | PRN

## 2015-05-20 MED ORDER — ONDANSETRON HCL 4 MG/2ML IJ SOLN: 4.0000 mg | Freq: Four times a day (QID) | INTRAMUSCULAR | Status: DC | PRN

## 2015-05-20 MED ORDER — ACETAMINOPHEN 650 MG RE SUPP: 650.0000 mg | Freq: Four times a day (QID) | RECTAL | Status: DC | PRN

## 2015-05-20 MED ORDER — HYDROCODONE-ACETAMINOPHEN 5-325 MG PO TABS: 1.0000 | ORAL_TABLET | ORAL | Status: DC | PRN

## 2015-05-20 MED ORDER — ACETAMINOPHEN 325 MG PO TABS: 650.0000 mg | ORAL_TABLET | Freq: Four times a day (QID) | ORAL | Status: DC | PRN

## 2015-05-20 MED ORDER — SODIUM CHLORIDE 0.9 % IJ SOLN
3.0000 mL | Freq: Two times a day (BID) | INTRAMUSCULAR | Status: DC
  Administered 2015-05-20 – 2015-05-23 (×3): 3 mL via INTRAVENOUS

## 2015-05-20 MED ORDER — PRAZOSIN HCL 1 MG PO CAPS
1.0000 mg | ORAL_CAPSULE | Freq: Two times a day (BID) | ORAL | Status: DC
  Administered 2015-05-20 – 2015-05-23 (×6): 1 mg via ORAL
  Filled 2015-05-20 (×8): qty 1

## 2015-05-20 MED ORDER — PIPERACILLIN-TAZOBACTAM 3.375 G IVPB
3.3750 g | Freq: Three times a day (TID) | INTRAVENOUS | Status: DC
  Administered 2015-05-20 – 2015-05-23 (×8): 3.375 g via INTRAVENOUS
  Filled 2015-05-20 (×14): qty 50

## 2015-05-20 NOTE — Progress Notes (Signed)
  PENDING ACCEPTANCE TRANFER NOTE:  Call received from:    [place] REQUESTING TRANSFER:    Right heel ulcer  CC:   HPI:   Patient with history of diabetes mellitus, CHF and atrial fibrillation came in with chronic right heel ulcer with worsening. Start to have some drainage, ED staff and [place] reported patient fibula sticking out of the wound. Wounds appear to be chronic with acute exacerbation. They spoke with Dr. Sharol Given over the phone, he will consult, let him know when the patient is here.  Last set of vitals done on 11:45 AM Pulse 72, RR 16, BP 102/67 and O2 sats 96% on room air. Will be admitted to regular MedSurg bed.   PLAN:  According to telephone report, this patient was accepted for transfer to [facility], under [place] team:  MCAdmit,  I have requested an order be written to call Flow Manager at [phone] upon patient arrival to the floor for final physician assignment who will do the admission and give admitting orders.  SIGNED: Birdie Hopes, MD [facility]  05/20/2015, 12:28 PM

## 2015-05-20 NOTE — Progress Notes (Addendum)
ANTIBIOTIC CONSULT NOTE - INITIAL  Pharmacy Consult for vancomycin, zosyn Indication: osteomyelitis/wound infection  No Known Allergies  Patient Measurements: Height: 5\' 11"  (180.3 cm) Weight: 150 lb (68.04 kg) IBW/kg (Calculated) : 75.3  Vital Signs: Temp: 98.2 F (36.8 C) (10/03 1604) Temp Source: Oral (10/03 1604) BP: 119/71 mmHg (10/03 1604) Pulse Rate: 75 (10/03 1604) Intake/Output from previous day:   Intake/Output from this shift:    Labs: No results for input(s): WBC, HGB, PLT, LABCREA, CREATININE in the last 72 hours. CrCl cannot be calculated (Patient has no serum creatinine result on file.). No results for input(s): VANCOTROUGH, VANCOPEAK, VANCORANDOM, GENTTROUGH, GENTPEAK, GENTRANDOM, TOBRATROUGH, TOBRAPEAK, TOBRARND, AMIKACINPEAK, AMIKACINTROU, AMIKACIN in the last 72 hours.   Microbiology: No results found for this or any previous visit (from the past 720 hour(s)).  Medical History: Past Medical History  Diagnosis Date  . Hypertension   . Morbid obesity ([place])   . Atrial flutter ([place])  10/01/2006, 12/30/11    Status post RF ablation, 7/13, Dr. [name]  . OSA (obstructive sleep apnea)     compliant with CPAP  . Nonischemic cardiomyopathy ([place])     Normal coronary arteries, 2008  . Chronic systolic dysfunction of left ventricle     EF 20-25%, global HK; mild RVD, 2-D echo, 5/13  . Unspecified disorder of kidney and ureter   . RBBB, anterior fascicular block and incomplete LBBB   . Pulmonary hypertension ([facility])     RVSP 50-55 mmHg, 5/13  . Valvular heart disease     Mild MR/TR, 2-D echo, 5/13  . Type II or unspecified type diabetes mellitus without mention of complication, not stated as uncontrolled    Assessment: 54 year old male with diabetes mellitus on insulin, and chronic CHF systolic, EF 94% per echo in 9/15, atrial fibrillation on pradaxa , hypertension, hyperlipidemia, chronic right foot ulcer, has been following Dr. Sharol Given outpatient presented to  [facility] ED with worsening right foot ulcer. He was given what looks like 1g of IV vancomycin this morning before transfer.   Of note labs from [facility] show elevated scr of 1.6, normal wbc. Xray shows extensive osteo.  Goal of Therapy:  Vancomycin trough level 15-20 mcg/ml  Plan:  Measure antibiotic drug levels at steady state Follow up culture results Vancomycin 750mg  IV q12 hours-start tonight Zosyn 3.375g IV q8 hours  Erin Hearing PharmD., BCPS Clinical Pharmacist Pager [phone] 05/20/2015 5:53 PM

## 2015-05-20 NOTE — Progress Notes (Signed)
Paged [employer]er[employer] for attending.

## 2015-05-20 NOTE — H&P (Signed)
History and Physical        Hospital Admission Note Date: 05/20/2015  Patient name: [name] Medical record number: [id number] Date of birth: 27-Jun-1961 Age: 54 y.o. Gender: male  PCP: SHAH,ASHISH, MD  Referring physician: Transferred from [facility]  Chief Complaint:  Worsening chronic right foot ulcer  HPI: Patient is a 54 year old male with diabetes mellitus on insulin, and chronic CHF systolic, EF 98% per echo in 9/15, atrial fibrillation on pradaxa , hypertension, hyperlipidemia, chronic right foot ulcer, has been following Dr. Sharol Given outpatient presented to [facility] ED with worsening right foot ulcer. History was obtained from the patient who reported that he has the right foot ulcer for the last 2 months. He had been wearing orthopedics brace to walk which was rubbing too close to the ankle and made the ulcer worse. He was seen by Dr. Sharol Given in the office a month ago and per patient was placed on doxycycline for 3 weeks. Patient noticed that in the last few days the ulcer was becoming more foul-smelling, draining, increasing in size and patient was having chills. He went to the ER today and patient was sent to [facility] for further management.  Review of Systems:  Constitutional: Denies fever, chills, diaphoresis, poor appetite and fatigue.  HEENT: Denies photophobia, eye pain, redness, hearing loss, ear pain, congestion, sore throat, rhinorrhea, sneezing, mouth sores, trouble swallowing, neck pain, neck stiffness and tinnitus.   Respiratory: Denies SOB, DOE, cough, chest tightness,  and wheezing.   Cardiovascular: Denies chest pain, palpitations and leg swelling.  Gastrointestinal: Denies nausea, vomiting, abdominal pain, diarrhea, constipation, blood in stool and abdominal distention.  Genitourinary: Denies dysuria, urgency, frequency, hematuria, flank  pain and difficulty urinating.  Musculoskeletal: Denies myalgias, back pain, joint swelling, arthralgias.  per patient, he has difficulty walking and was getting brace in the right leg, which was rubbing too close to the ankle Skin: Please see history of present illness  Neurological: Denies dizziness, seizures, syncope, weakness, light-headedness, numbness and headaches.  Hematological: Denies adenopathy. Easy bruising, personal or family bleeding history  Psychiatric/Behavioral: Denies suicidal ideation, mood changes, confusion, nervousness, sleep disturbance and agitation  Past Medical History: Past Medical History  Diagnosis Date  . Hypertension   . Morbid obesity ([place])   . Atrial flutter ([place])  10/01/2006, 12/30/11    Status post RF ablation, 7/13, Dr. [name]  . OSA (obstructive sleep apnea)     compliant with CPAP  . Nonischemic cardiomyopathy ([place])     Normal coronary arteries, 2008  . Chronic systolic dysfunction of left ventricle     EF 20-25%, global HK; mild RVD, 2-D echo, 5/13  . Unspecified disorder of kidney and ureter   . RBBB, anterior fascicular block and incomplete LBBB   . Pulmonary hypertension (HCC)     RVSP 50-55 mmHg, 5/13  . Valvular heart disease     Mild MR/TR, 2-D echo, 5/13  . Type II or unspecified type diabetes mellitus without mention of complication, not stated as uncontrolled     Past Surgical History  Procedure Laterality Date  . Bi-ventricular implantable cardioverter defibrillator  (crt-d)  10/27/2013    MDT [name] XT  CRTD implanted by [name] for NICM, CHF  . Atrial flutter ablation N/A 03/04/2012    Procedure: ATRIAL FLUTTER ABLATION;  Surgeon: Thompson Grayer, MD;  Location: [facility] CATH LAB;  Service: Cardiovascular;  Laterality: N/A;  . Bi-ventricular implantable cardioverter defibrillator N/A 10/27/2013    Procedure: BI-VENTRICULAR IMPLANTABLE CARDIOVERTER DEFIBRILLATOR  (CRT-D);  Surgeon: Coralyn Mark, MD;  Location: [facility] CATH LAB;  Service:  Cardiovascular;  Laterality: N/A;  . Cystoscopy/retrograde/ureteroscopy Bilateral 10/16/2014    Procedure: Kathrene Alu WITH BILATERAL Freddie Breech WITH GYRUS;  Surgeon: Malka So, MD;  Location: [facility] ORS;  Service: Urology;  Laterality: Bilateral;  . Transurethral resection of bladder tumor with gyrus (turbt-gyrus) N/A 10/16/2014    Procedure: TRANSURETHRAL RESECTION OF BLADDER TUMOR WITH GYRUS (TURBT-GYRUS);  Surgeon: Malka So, MD;  Location: [facility] ORS;  Service: Urology;  Laterality: N/A;  . Cataract extraction w/phaco Right 03/25/2015    Procedure: CATARACT EXTRACTION PHACO AND INTRAOCULAR LENS PLACEMENT RIGHT EYE CDE=12.24;  Surgeon: Tonny Branch, MD;  Location: [facility] ORS;  Service: Ophthalmology;  Laterality: Right;    Medications: Prior to Admission medications   Medication Sig Start Date End Date Taking? Authorizing Provider  AFLURIA PRESERVATIVE FREE 0.5 ML SUSY Inject 0.5 mLs into the muscle once.  03/26/15  Yes Historical Provider, MD  allopurinol (ZYLOPRIM) 300 MG tablet Take 300 mg by mouth daily. 05/09/15  Yes Historical Provider, MD  atorvastatin (LIPITOR) 10 MG tablet Take 10 mg by mouth every morning.    Yes Historical Provider, MD  Canagliflozin (INVOKANA) 300 MG TABS Take 300 mg by mouth every evening.    Yes Historical Provider, MD  cloNIDine (CATAPRES) 0.2 MG tablet Take 0.2 mg by mouth 2 (two) times daily.     Yes Historical Provider, MD  COLCRYS 0.6 MG tablet Take 0.6 mg by mouth daily as needed (gout).  12/18/11  Yes Historical Provider, MD  dabigatran (PRADAXA) 150 MG CAPS capsule Take 1 capsule (150 mg total) by mouth 2 (two) times daily. 02/27/15  Yes Herminio Commons, MD  digoxin (LANOXIN) 0.25 MG tablet Take 0.125 mg by mouth daily.   Yes Historical Provider, MD  gemfibrozil (LOPID) 600 MG tablet Take 600 mg by mouth 2 (two) times daily before a meal.     Yes Historical Provider, MD  insulin detemir (LEVEMIR) 100 UNIT/ML injection Inject 0.5 mLs (50 Units total) into the skin 2  (two) times daily. 10/18/14  Yes Theodis Blaze, MD  metoprolol (LOPRESSOR) 50 MG tablet Take 1.5 tablets (75 mg total) by mouth 2 (two) times daily. 11/29/14  Yes Herminio Commons, MD  NOVOLOG FLEXPEN 100 UNIT/ML FlexPen Inject 25-36 Units into the skin 3 (three) times daily with meals. Takes 25 units in am, 25 units at lunch and 36 units in pm 05/09/15  Yes Historical Provider, MD  nystatin ointment (MYCOSTATIN) Apply 1 application topically daily as needed (for fungal infection). For fungal irritation 04/12/15  Yes Historical Provider, MD  prazosin (MINIPRESS) 1 MG capsule TAKE ONE CAPSULE BY MOUTH TWICE DAILY 12/10/14  Yes Herminio Commons, MD  sitaGLIPtin (JANUVIA) 100 MG tablet Take 100 mg by mouth daily.   Yes Historical Provider, MD  spironolactone (ALDACTONE) 25 MG tablet Take 0.5 tablets (12.5 mg total) by mouth daily. 11/29/14  Yes Herminio Commons, MD  torsemide (DEMADEX) 20 MG tablet Take 1 tablet (20 mg total) by mouth as directed. Starting today 03/06/15 TAKE TWO TABLETS (40 MG) TOTAL  IN THE AM AND ONE TABLET (20  MG) TOTAL IN THE EVENING Patient taking differently: Take 20-40 mg by mouth 2 (two) times daily. Starting today 03/06/15 TAKE TWO TABLETS (40 MG) TOTAL  IN THE AM AND ONE TABLET (20 MG) TOTAL IN THE EVENING 03/06/15  Yes Imogene Burn, PA-C  valsartan (DIOVAN) 80 MG tablet Take 1 tablet (80 mg total) by mouth daily. 11/29/14  Yes Herminio Commons, MD  allopurinol (ZYLOPRIM) 100 MG tablet Take 1 tablet (100 mg total) by mouth daily. 10/18/14   Theodis Blaze, MD    Allergies:  No Known Allergies  Social History:  reports that he has never smoked. He has never used smokeless tobacco. He reports that he does not drink alcohol or use illicit drugs.  Family History: Family History  Problem Relation Age of Onset  . Asthma Father 16    DECEASED  . Diabetes     patient brother died of liver cirrhosis  Physical Exam: Blood pressure 119/71, pulse 75, temperature 98.2 F  (36.8 C), temperature source Oral, resp. rate 17, height 5\' 11"  (1.803 m), weight 68.04 kg (150 lb), SpO2 94 %. General: Alert, awake, oriented x3, in no acute distress. HEENT: normocephalic, atraumatic, anicteric sclera, pink conjunctiva, pupils equal and reactive to light and accomodation, oropharynx clear Neck: supple, no masses or lymphadenopathy, no goiter, no bruits  Heart: Irregularly irregular  without murmurs, rubs or gallops. Pacemaker in the left chest wall Lungs: Clear to auscultation bilaterally, no wheezing, rales or rhonchi. Abdomen: Obese, Soft, nontender, nondistended, positive bowel sounds, no masses. Extremities: No clubbing, cyanosis. Right lower extremity with venous insufficiency, erythema, one large ulcer ~ 5x6cm, near the right heel, draining, foul-smelling, touching to the bone. Another small ulcer about quarter size with eschar on the right foot.  Neuro: Grossly intact, no focal neurological deficits, strength 5/5 upper and lower extremities bilaterally Psych: alert and oriented x 3, normal mood and affect Skin: Please see examined exam   LABS on Admission:  Basic Metabolic Panel: No results for input(s): NA, K, CL, CO2, GLUCOSE, BUN, CREATININE, CALCIUM, MG, PHOS in the last 168 hours. Liver Function Tests: No results for input(s): AST, ALT, ALKPHOS, BILITOT, PROT, ALBUMIN in the last 168 hours. No results for input(s): LIPASE, AMYLASE in the last 168 hours. No results for input(s): AMMONIA in the last 168 hours. CBC: No results for input(s): WBC, NEUTROABS, HGB, HCT, MCV, PLT in the last 168 hours. Cardiac Enzymes: No results for input(s): CKTOTAL, CKMB, CKMBINDEX, TROPONINI in the last 168 hours. BNP: Invalid input(s): POCBNP CBG: No results for input(s): GLUCAP in the last 168 hours.  Radiological Exams on Admission:  No results found.  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 Sharol Given who will evaluate patient, per recommendations will likely need BKA, NPO after midnight  Active Problems: Insulin-dependent diabetes mellitus - Obtain hemoglobin A1c - place on Levemir 50units qhs rather than home BID dosing (as patient will be NPO after midnight), sliding scale insulin and meal coverage    OBESITY-MORBID (>100') - Patient strongly counseled on diet and weight control    Essential hypertension - Currently stable, continue metoprolol, Avapro, clonidine, prazosin    Atrial flutter  - Currently  rate controlled continue metoprolol, digoxin - HOLD pradaxa for the surgery tomorrow     Chronic systolic dysfunction of left ventricle - Currently compensated, EF 35% per 2-D echo in 9/15 - Obtain labs for the renal function, strict I's and O's, continue beta blocker, torsemide    HLD (hyperlipidemia) - Continue statin  DVT prophylaxis: holding Pradaxa. Will restart after the surgery tomorrow.  CODE STATUS: full code  Family Communication: Admission, patients condition and plan of care including tests being ordered have been discussed with the patient  who indicates understanding and agree with the plan and Code Status  Disposition plan: Further plan will depend as patient's clinical course evolves and further radiologic and laboratory data become available.   Time Spent on Admission: 71mins   RAI,RIPUDEEP M.D. [facility] 05/20/2015, 5:34 PM Pager: [phone]  If 7PM-7AM, please contact night-coverage [email or web address] Password [email or web address]

## 2015-05-20 NOTE — Progress Notes (Signed)
Pt arrived via stretcher by [facility] from [facility].   Pt alert and oriented.  VSS. Denies pain at this time. Paged Dr. Hartford Poli pt has arrived and no orders at this time.

## 2015-05-21 ENCOUNTER — Inpatient Hospital Stay (HOSPITAL_COMMUNITY): Payer: Medicare Other | Admitting: Anesthesiology

## 2015-05-21 ENCOUNTER — Encounter (HOSPITAL_COMMUNITY)
Admission: AD | Disposition: A | Discharge status: Rehabilitation Facility | Payer: Self-pay | Source: Other Acute Inpatient Hospital | Attending: Internal Medicine

## 2015-05-21 DIAGNOSIS — L97513 Non-pressure chronic ulcer of other part of right foot with necrosis of muscle: Secondary | ICD-10-CM

## 2015-05-21 HISTORY — PX: AMPUTATION: SHX166

## 2015-05-21 LAB — BASIC METABOLIC PANEL
Anion gap: 10 (ref 5–15)
BUN: 39 mg/dL — AB (ref 6–20)
CALCIUM: 8.2 mg/dL — AB (ref 8.9–10.3)
CO2: 24 mmol/L (ref 22–32)
CREATININE: 1.55 mg/dL — AB (ref 0.61–1.24)
Chloride: 103 mmol/L (ref 101–111)
GFR calc non Af Amer: 49 mL/min — ABNORMAL LOW (ref >60)
GFR, EST AFRICAN AMERICAN: 57 mL/min — AB (ref >60)
Glucose, Bld: 111 mg/dL — ABNORMAL HIGH (ref 65–99)
Potassium: 3.2 mmol/L — ABNORMAL LOW (ref 3.5–5.1)
SODIUM: 137 mmol/L (ref 135–145)

## 2015-05-21 LAB — MAGNESIUM: MAGNESIUM: 1.8 mg/dL (ref 1.7–2.4)

## 2015-05-21 LAB — COMPREHENSIVE METABOLIC PANEL
ALBUMIN: 1.6 g/dL — AB (ref 3.5–5.0)
ALK PHOS: 159 U/L — AB (ref 38–126)
ALT: 15 U/L — ABNORMAL LOW (ref 17–63)
ANION GAP: 10 (ref 5–15)
AST: 19 U/L (ref 15–41)
BUN: 31 mg/dL — ABNORMAL HIGH (ref 6–20)
CALCIUM: 8.3 mg/dL — AB (ref 8.9–10.3)
CO2: 24 mmol/L (ref 22–32)
Chloride: 105 mmol/L (ref 101–111)
Creatinine, Ser: 1.33 mg/dL — ABNORMAL HIGH (ref 0.61–1.24)
GFR calc non Af Amer: 59 mL/min — ABNORMAL LOW (ref >60)
GLUCOSE: 82 mg/dL (ref 65–99)
POTASSIUM: 3.1 mmol/L — AB (ref 3.5–5.1)
SODIUM: 139 mmol/L (ref 135–145)
TOTAL PROTEIN: 5.8 g/dL — AB (ref 6.5–8.1)
Total Bilirubin: 0.6 mg/dL (ref 0.3–1.2)

## 2015-05-21 LAB — CBC
HCT: 28.5 % — ABNORMAL LOW (ref 39.0–52.0)
Hemoglobin: 9.1 g/dL — ABNORMAL LOW (ref 13.0–17.0)
MCH: 26.1 pg (ref 26.0–34.0)
MCHC: 31.9 g/dL (ref 30.0–36.0)
MCV: 81.7 fL (ref 78.0–100.0)
Platelets: 294 10*3/uL (ref 150–400)
RBC: 3.49 MIL/uL — AB (ref 4.22–5.81)
RDW: 15.5 % (ref 11.5–15.5)
WBC: 6.3 10*3/uL (ref 4.0–10.5)

## 2015-05-21 LAB — CBC WITH DIFFERENTIAL/PLATELET
BASOS PCT: 1 %
Basophils Absolute: 0.1 10*3/uL (ref 0.0–0.1)
EOS ABS: 0.1 10*3/uL (ref 0.0–0.7)
EOS PCT: 2 %
HCT: 28.3 % — ABNORMAL LOW (ref 39.0–52.0)
Hemoglobin: 9.1 g/dL — ABNORMAL LOW (ref 13.0–17.0)
LYMPHS ABS: 1.5 10*3/uL (ref 0.7–4.0)
Lymphocytes Relative: 21 %
MCH: 26.5 pg (ref 26.0–34.0)
MCHC: 32.2 g/dL (ref 30.0–36.0)
MCV: 82.3 fL (ref 78.0–100.0)
MONO ABS: 0.6 10*3/uL (ref 0.1–1.0)
MONOS PCT: 8 %
Neutro Abs: 4.9 10*3/uL (ref 1.7–7.7)
Neutrophils Relative %: 68 %
Platelets: 346 10*3/uL (ref 150–400)
RBC: 3.44 MIL/uL — ABNORMAL LOW (ref 4.22–5.81)
RDW: 15.6 % — AB (ref 11.5–15.5)
WBC: 7.2 10*3/uL (ref 4.0–10.5)

## 2015-05-21 LAB — SURGICAL PCR SCREEN
MRSA, PCR: NEGATIVE
STAPHYLOCOCCUS AUREUS: NEGATIVE

## 2015-05-21 LAB — GLUCOSE, CAPILLARY
GLUCOSE-CAPILLARY: 101 mg/dL — AB (ref 65–99)
GLUCOSE-CAPILLARY: 73 mg/dL (ref 65–99)
Glucose-Capillary: 69 mg/dL (ref 65–99)
Glucose-Capillary: 125 mg/dL — ABNORMAL HIGH (ref 65–99)
Glucose-Capillary: 61 mg/dL — ABNORMAL LOW (ref 65–99)

## 2015-05-21 LAB — ABO/RH: ABO/RH(D): O POS

## 2015-05-21 LAB — PROTIME-INR
INR: 2.01 — ABNORMAL HIGH (ref 0.00–1.49)
PROTHROMBIN TIME: 22.6 s — AB (ref 11.6–15.2)

## 2015-05-21 LAB — PREPARE RBC (CROSSMATCH)

## 2015-05-21 LAB — HEMOGLOBIN A1C
HEMOGLOBIN A1C: 9 % — AB (ref 4.8–5.6)
Mean Plasma Glucose: 212 mg/dL

## 2015-05-21 SURGERY — AMPUTATION BELOW KNEE
Anesthesia: General | Site: Leg Lower | Laterality: Right

## 2015-05-21 MED ORDER — FENTANYL CITRATE (PF) 100 MCG/2ML IJ SOLN
INTRAMUSCULAR | Status: DC | PRN
  Administered 2015-05-21 (×2): 50 ug via INTRAVENOUS

## 2015-05-21 MED ORDER — SODIUM CHLORIDE 0.45 % IV SOLN: INTRAVENOUS | Status: DC

## 2015-05-21 MED ORDER — METHOCARBAMOL 1000 MG/10ML IJ SOLN
500.0000 mg | Freq: Four times a day (QID) | INTRAMUSCULAR | Status: DC | PRN
  Filled 2015-05-21: qty 5

## 2015-05-21 MED ORDER — DEXTROSE 50 % IV SOLN
INTRAVENOUS | Status: AC
  Administered 2015-05-21: 25 mL
  Filled 2015-05-21: qty 50

## 2015-05-21 MED ORDER — LACTATED RINGERS IV SOLN
INTRAVENOUS | Status: DC | PRN
  Administered 2015-05-21: 20:00:00 via INTRAVENOUS

## 2015-05-21 MED ORDER — ONDANSETRON HCL 4 MG/2ML IJ SOLN: 4.0000 mg | Freq: Four times a day (QID) | INTRAMUSCULAR | Status: DC | PRN

## 2015-05-21 MED ORDER — METHOCARBAMOL 500 MG PO TABS: 500.0000 mg | ORAL_TABLET | Freq: Four times a day (QID) | ORAL | Status: DC | PRN

## 2015-05-21 MED ORDER — LIDOCAINE HCL (CARDIAC) 20 MG/ML IV SOLN
INTRAVENOUS | Status: DC | PRN
  Administered 2015-05-21: 50 mg via INTRAVENOUS

## 2015-05-21 MED ORDER — POTASSIUM CHLORIDE CRYS ER 20 MEQ PO TBCR
40.0000 meq | EXTENDED_RELEASE_TABLET | ORAL | Status: DC
  Administered 2015-05-21: 40 meq via ORAL
  Filled 2015-05-21: qty 2

## 2015-05-21 MED ORDER — METOCLOPRAMIDE HCL 5 MG PO TABS: 5.0000 mg | ORAL_TABLET | Freq: Three times a day (TID) | ORAL | Status: DC | PRN

## 2015-05-21 MED ORDER — ONDANSETRON HCL 4 MG/2ML IJ SOLN
INTRAMUSCULAR | Status: DC | PRN
  Administered 2015-05-21: 4 mg via INTRAVENOUS

## 2015-05-21 MED ORDER — ADULT MULTIVITAMIN W/MINERALS CH
1.0000 | ORAL_TABLET | Freq: Every day | ORAL | Status: DC
  Administered 2015-05-22 – 2015-05-23 (×2): 1 via ORAL
  Filled 2015-05-21 (×2): qty 1

## 2015-05-21 MED ORDER — DEXTROSE 50 % IV SOLN
25.0000 mL | Freq: Once | INTRAVENOUS | Status: AC
Start: 2015-05-21 — End: 2015-05-21
  Administered 2015-05-21: 25 mL via INTRAVENOUS

## 2015-05-21 MED ORDER — SUGAMMADEX SODIUM 200 MG/2ML IV SOLN
INTRAVENOUS | Status: AC
  Filled 2015-05-21: qty 2

## 2015-05-21 MED ORDER — DEXTROSE-NACL 5-0.45 % IV SOLN
INTRAVENOUS | Status: DC
  Administered 2015-05-21 – 2015-05-22 (×3): via INTRAVENOUS

## 2015-05-21 MED ORDER — HYDROMORPHONE HCL 1 MG/ML IJ SOLN
1.0000 mg | INTRAMUSCULAR | Status: DC | PRN
  Administered 2015-05-22 (×2): 1 mg via INTRAVENOUS
  Filled 2015-05-21 (×2): qty 1

## 2015-05-21 MED ORDER — ARTIFICIAL TEARS OP OINT
TOPICAL_OINTMENT | OPHTHALMIC | Status: AC
  Filled 2015-05-21: qty 3.5

## 2015-05-21 MED ORDER — OXYCODONE HCL 5 MG PO TABS: 5.0000 mg | ORAL_TABLET | ORAL | Status: DC | PRN

## 2015-05-21 MED ORDER — 0.9 % SODIUM CHLORIDE (POUR BTL) OPTIME
TOPICAL | Status: DC | PRN
  Administered 2015-05-21: 1000 mL

## 2015-05-21 MED ORDER — METOCLOPRAMIDE HCL 5 MG/ML IJ SOLN: 5.0000 mg | Freq: Three times a day (TID) | INTRAMUSCULAR | Status: DC | PRN

## 2015-05-21 MED ORDER — DEXTROSE 50 % IV SOLN
INTRAVENOUS | Status: AC
  Filled 2015-05-21: qty 50

## 2015-05-21 MED ORDER — PROPOFOL 10 MG/ML IV BOLUS
INTRAVENOUS | Status: DC | PRN
  Administered 2015-05-21: 80 mg via INTRAVENOUS

## 2015-05-21 MED ORDER — ACETAMINOPHEN 325 MG PO TABS: 650.0000 mg | ORAL_TABLET | Freq: Four times a day (QID) | ORAL | Status: DC | PRN

## 2015-05-21 MED ORDER — MIDAZOLAM HCL 5 MG/5ML IJ SOLN
INTRAMUSCULAR | Status: DC | PRN
  Administered 2015-05-21: 1 mg via INTRAVENOUS

## 2015-05-21 MED ORDER — ONDANSETRON HCL 4 MG PO TABS: 4.0000 mg | ORAL_TABLET | Freq: Four times a day (QID) | ORAL | Status: DC | PRN

## 2015-05-21 MED ORDER — MIDAZOLAM HCL 2 MG/2ML IJ SOLN
INTRAMUSCULAR | Status: AC
  Filled 2015-05-21: qty 4

## 2015-05-21 MED ORDER — ROCURONIUM BROMIDE 100 MG/10ML IV SOLN
INTRAVENOUS | Status: DC | PRN
  Administered 2015-05-21: 40 mg via INTRAVENOUS

## 2015-05-21 MED ORDER — SODIUM CHLORIDE 0.9 % IV SOLN: INTRAVENOUS | Status: DC

## 2015-05-21 MED ORDER — MEPERIDINE HCL 25 MG/ML IJ SOLN: 6.2500 mg | INTRAMUSCULAR | Status: DC | PRN

## 2015-05-21 MED ORDER — FENTANYL CITRATE (PF) 250 MCG/5ML IJ SOLN
INTRAMUSCULAR | Status: AC
  Filled 2015-05-21: qty 5

## 2015-05-21 MED ORDER — HYDROMORPHONE HCL 1 MG/ML IJ SOLN
0.2500 mg | INTRAMUSCULAR | Status: DC | PRN
  Administered 2015-05-21: 0.5 mg via INTRAVENOUS

## 2015-05-21 MED ORDER — ACETAMINOPHEN 650 MG RE SUPP: 650.0000 mg | Freq: Four times a day (QID) | RECTAL | Status: DC | PRN

## 2015-05-21 MED ORDER — SUGAMMADEX SODIUM 200 MG/2ML IV SOLN
INTRAVENOUS | Status: DC | PRN
  Administered 2015-05-21: 200 mg via INTRAVENOUS

## 2015-05-21 MED ORDER — PROMETHAZINE HCL 25 MG/ML IJ SOLN: 6.2500 mg | INTRAMUSCULAR | Status: DC | PRN

## 2015-05-21 MED ORDER — HYDROMORPHONE HCL 1 MG/ML IJ SOLN
INTRAMUSCULAR | Status: AC
  Filled 2015-05-21: qty 1

## 2015-05-21 SURGICAL SUPPLY — 37 items
BLADE SAW RECIP 87.9 MT (BLADE) ×3 IMPLANT
BLADE SURG 21 STRL SS (BLADE) ×3 IMPLANT
BNDG COHESIVE 6X5 TAN STRL LF (GAUZE/BANDAGES/DRESSINGS) ×4 IMPLANT
BNDG GAUZE ELAST 4 BULKY (GAUZE/BANDAGES/DRESSINGS) ×6 IMPLANT
CANISTER SUCTION 2500CC (MISCELLANEOUS) ×2 IMPLANT
COVER SURGICAL LIGHT HANDLE (MISCELLANEOUS) ×4 IMPLANT
CUFF TOURNIQUET SINGLE 34IN LL (TOURNIQUET CUFF) ×2 IMPLANT
CUFF TOURNIQUET SINGLE 44IN (TOURNIQUET CUFF) IMPLANT
DRAPE EXTREMITY T 121X128X90 (DRAPE) ×3 IMPLANT
DRAPE PROXIMA HALF (DRAPES) ×2 IMPLANT
DRAPE U-SHAPE 47X51 STRL (DRAPES) ×3 IMPLANT
DRSG ADAPTIC 3X8 NADH LF (GAUZE/BANDAGES/DRESSINGS) ×3 IMPLANT
DRSG PAD ABDOMINAL 8X10 ST (GAUZE/BANDAGES/DRESSINGS) ×6 IMPLANT
DURAPREP 26ML APPLICATOR (WOUND CARE) ×3 IMPLANT
ELECT REM PT RETURN 9FT ADLT (ELECTROSURGICAL) ×3
ELECTRODE REM PT RTRN 9FT ADLT (ELECTROSURGICAL) ×1 IMPLANT
GAUZE SPONGE 4X4 12PLY STRL (GAUZE/BANDAGES/DRESSINGS) ×3 IMPLANT
GLOVE BIOGEL PI IND STRL 9 (GLOVE) ×1 IMPLANT
GLOVE BIOGEL PI INDICATOR 9 (GLOVE) ×2
GLOVE SURG ORTHO 9.0 STRL STRW (GLOVE) ×3 IMPLANT
GOWN STRL REUS W/ TWL XL LVL3 (GOWN DISPOSABLE) ×2 IMPLANT
GOWN STRL REUS W/TWL XL LVL3 (GOWN DISPOSABLE) ×6
KIT BASIN OR (CUSTOM PROCEDURE TRAY) ×3 IMPLANT
KIT ROOM TURNOVER OR (KITS) ×3 IMPLANT
MANIFOLD NEPTUNE II (INSTRUMENTS) ×1 IMPLANT
NS IRRIG 1000ML POUR BTL (IV SOLUTION) ×3 IMPLANT
PACK GENERAL/GYN (CUSTOM PROCEDURE TRAY) ×3 IMPLANT
PAD ARMBOARD 7.5X6 YLW CONV (MISCELLANEOUS) ×4 IMPLANT
SPONGE LAP 18X18 X RAY DECT (DISPOSABLE) IMPLANT
STAPLER VISISTAT 35W (STAPLE) ×2 IMPLANT
STOCKINETTE IMPERVIOUS LG (DRAPES) ×3 IMPLANT
SUT SILK 2 0 (SUTURE) ×3
SUT SILK 2-0 18XBRD TIE 12 (SUTURE) ×1 IMPLANT
SUT VIC AB 1 CTX 27 (SUTURE) ×2 IMPLANT
TOWEL OR 17X24 6PK STRL BLUE (TOWEL DISPOSABLE) ×3 IMPLANT
TOWEL OR 17X26 10 PK STRL BLUE (TOWEL DISPOSABLE) ×3 IMPLANT
WATER STERILE IRR 1000ML POUR (IV SOLUTION) IMPLANT

## 2015-05-21 NOTE — Op Note (Signed)
05/20/2015 - 05/21/2015  8:44 PM  PATIENT:  [name]    PRE-OPERATIVE DIAGNOSIS:  Infected right ankle  POST-OPERATIVE DIAGNOSIS:  Same  PROCEDURE:  AMPUTATION BELOW KNEE  SURGEON:  Newt Minion, MD  PHYSICIAN ASSISTANT:None ANESTHESIA:   General  PREOPERATIVE INDICATIONS:  REMUS HAGEDORN is a  54 y.o. male with a diagnosis of Infected right ankle who failed conservative measures and elected for surgical management.    The risks benefits and alternatives were discussed with the patient preoperatively including but not limited to the risks of infection, bleeding, nerve injury, cardiopulmonary complications, the need for revision surgery, among others, and the patient was willing to proceed.  OPERATIVE IMPLANTS: None  OPERATIVE FINDINGS: No deep abscess, severely calcified vessels.  OPERATIVE PROCEDURE: Patient was brought to the operating room and underwent a general and aesthetic. After adequate levels anesthesia were obtained patient's right lower extremity was prepped using DuraPrep draped in the sterile field the foot was draped out of the sterile field with impervious stockinette and was not exposed to the sterile field. A timeout was called. A transverse incision was made 11 cm distal to the tibial tubercle. This curved proximally and a posterior flap was created. The tibia was transected just proximal to the skin incision and beveled anteriorly. The fibula was transected just proximal to the tibia. The sciatic nerve was pulled cut and allowed to retract. The vascular bundles were suture ligated with 2-0 silk. The tourniquet was deflated hemostasis was obtained. The deep and superficial fascial layers were closed using #1 Vicryls. The skin was closed using staples. A sterile compressive dressing was applied. Patient was extubated taken to the PACU in stable condition.

## 2015-05-21 NOTE — Progress Notes (Signed)
Patient was transported to OR.Patient alert and oriented x4. Onyx Schirmer, Wonda Cheng, Therapist, sports

## 2015-05-21 NOTE — Progress Notes (Signed)
CBG 82 @2050  in PACU. Unable to scan patient barcode

## 2015-05-21 NOTE — Transfer of Care (Signed)
Immediate Anesthesia Transfer of Care Note  Patient: [name]  Procedure(s) Performed: Procedure(s): AMPUTATION BELOW KNEE (Right)  Patient Location: PACU  Anesthesia Type:General  Level of Consciousness: awake, alert , oriented and patient cooperative  Airway & Oxygen Therapy: Patient Spontanous Breathing and Patient connected to nasal cannula oxygen  Post-op Assessment: Report given to RN, Post -op Vital signs reviewed and stable, Patient moving all extremities and Patient moving all extremities X 4  Post vital signs: Reviewed and stable  Last Vitals:  Filed Vitals:   05/21/15 1513  BP: 127/75  Pulse: 64  Temp: 36.6 C  Resp: 18    Complications: No apparent anesthesia complications

## 2015-05-21 NOTE — Anesthesia Postprocedure Evaluation (Signed)
  Anesthesia Post-op Note  Patient: [name]  Procedure(s) Performed: Procedure(s): AMPUTATION BELOW KNEE (Right)  Patient Location: PACU  Anesthesia Type:General  Level of Consciousness: awake and alert   Airway and Oxygen Therapy: Patient Spontanous Breathing  Post-op Pain: Controlled  Post-op Assessment: Post-op Vital signs reviewed, Patient's Cardiovascular Status Stable and Respiratory Function Stable  Post-op Vital Signs: Reviewed  Filed Vitals:   05/21/15 2106  BP:   Pulse:   Temp: 36.5 C  Resp:     Complications: No apparent anesthesia complications

## 2015-05-21 NOTE — Progress Notes (Signed)
Initial Nutrition Assessment  DOCUMENTATION CODES:   Obesity unspecified  INTERVENTION:  - Order multivitamin. - Refer patient to Nutrition/Diabetes Management center for additional counseling. - Provided diet education review on carbohydrate counting, and eating healthy when eating out. -RD to order protein supplements once diet advances to aid in wound healing.   NUTRITION DIAGNOSIS:   Other (Comment) (excessive carbohydrate intake) related to limited prior education as evidenced by per patient/family report (verbalizes inaccurate or incomplete information).  GOAL:   Patient will meet greater than or equal to 90% of their needs    MONITOR:   PO intake, Supplement acceptance, Diet advancement, Labs, Weight trends, Skin, I & O's  REASON FOR ASSESSMENT:   Malnutrition Screening Tool    ASSESSMENT:   54 y.o. male, admitted to hospital for right food ulcer. Previous medical history of Dm, chronic CHF systolic, EF 44% per echo, A.Fib, HTN, hyperlipidemia, and chronic right foot ulcer. Patient currently NPO and prepping for surgery.  Plans for R BKA.   Patient alert and communicative during assessment for MST. Patient's BMI is 35, Obesity Class I. Patient has had recent weight loss of 54 lbs in the past 8 months, (18%), non-significant. Patient attributed his weight loss success due to reductions in portion sizes and increasing fruits and vegetables in his diet.   Patient usually cooks for himself or eats out. A typical day usual includes: skipping breakfast, then eating a sandwich (such as roast beef), or soup or a chef's salad. Patient has started eating a lot more fruits and vegetables to his diet (including: grapes, strawberries, watermelon; chef's salads). Supper may be steak and a salad if he cooks at home. Often he goes out to eat for supper - eating at [facility], or [employer]. Patient identified that supper was a time when it was hardest for him to follow his  diet goals. Snacks usually include fruit but likes peanut butter / crackers or sandwiches. Beverages throughout the day are un-sweet tea, crystal light, or a little soda occasionally. Patient has taken a vitamin in the past, but does not take one currently.   Patient has previously received carbohydrate counting "a long time ago". He was able to recall types of carbohydrates and macronutrient's. However, he verbalizes incomplete knowledge on how to count the carbohydrates. He was open to education and asked for more information on healthy eating during supper. RD student reviewed with patient carbohydrate counting principles using handout: "why is carbohydrate counting important." Teach back method was used. Also, discussed with patient tips for eating healthy when eating out and healthy breakfast tips to manage blood sugars throughout the day. Patient is actively engaged in healthy changes and expressed high motivation towards making more changes to his diet. Expected compliance rate is high. Recommend for patient to continue nutrition counseling at [facility]. Patient open to trying protein supplements or protein shake for breakfast as diet advances.  NFPE indicate patient is well nourished.  Labs reviewed: high CBG (101-213), low K (3.2), high BUN (39), high Cr (1.55), low Ca (8.2), high HBA1c (9.0).   Diet Order:  Diet NPO time specified Except for: Ice Chips, Sips with Meds  Skin:  Wound (see comment) (diabetic ulcer, right ankle)  Last BM:  05/19/15  Height:   Ht Readings from Last 1 Encounters:  05/21/15 5\' 11"  (1.803 m)    Weight:   Wt Readings from Last 1 Encounters:  05/21/15 250 lb 8 oz (113.626 kg)    Ideal Body Weight:  78.3 kg  BMI:  Body mass index is 34.95 kg/(m^2).  Estimated Nutritional Needs:   Kcal:  2100-2300  Protein:  135-150 grams  Fluid:  2.1 - 2.3 L/day  EDUCATION NEEDS:   Education needs addressed  Kayleen Memos, BA. BS Dietetic Intern 05/21/2015 2:02  PM  __________________________  RD as reviewed and agrees with dietetic intern's note. Appropriate changes have been made.   Corrin Parker, MS, RD, LDN Pager # [phone] After hours/ weekend pager # [phone]

## 2015-05-21 NOTE — Consult Note (Signed)
Reason for Consult: Abscess ulceration gangrene osteomyelitis right ankle Referring Physician: Dr. Ileene Patrick [name] is an 54 y.o. male.  HPI: Patient is a 54 year old gentleman who is undergone limb salvage intervention for the right ankle. Patient presents at this time with cellulitis necrotic tissue exposed bone and abscess lateral malleolus right ankle  Past Medical History  Diagnosis Date  . Hypertension   . Morbid obesity ([place])   . Atrial flutter ([place])  10/01/2006, 12/30/11    Status post RF ablation, 7/13, Dr. [name]  . OSA (obstructive sleep apnea)     compliant with CPAP  . Nonischemic cardiomyopathy ([place])     Normal coronary arteries, 2008  . Chronic systolic dysfunction of left ventricle     EF 20-25%, global HK; mild RVD, 2-D echo, 5/13  . Unspecified disorder of kidney and ureter   . RBBB, anterior fascicular block and incomplete LBBB   . Pulmonary hypertension (H[facility])     RVSP 50-55 mmHg, 5/13  . Valvular heart disease     Mild MR/TR, 2-D echo, 5/13  . Type II or unspecified type diabetes mellitus without mention of complication, not stated as uncontrolled     Past Surgical History  Procedure Laterality Date  . Bi-ventricular implantable cardioverter defibrillator  (crt-d)  10/27/2013    MDT [name] XT CRTD implanted by [name] for NICM, CHF  . Atrial flutter ablation N/A 03/04/2012    Procedure: ATRIAL FLUTTER ABLATION;  Surgeon: Thompson Grayer, MD;  Location: [facility] CATH LAB;  Service: Cardiovascular;  Laterality: N/A;  . Bi-ventricular implantable cardioverter defibrillator N/A 10/27/2013    Procedure: BI-VENTRICULAR IMPLANTABLE CARDIOVERTER DEFIBRILLATOR  (CRT-D);  Surgeon: Coralyn Mark, MD;  Location: [facility] CATH LAB;  Service: Cardiovascular;  Laterality: N/A;  . Cystoscopy/retrograde/ureteroscopy Bilateral 10/16/2014    Procedure: Kathrene Alu WITH BILATERAL Freddie Breech WITH GYRUS;  Surgeon: Malka So, MD;  Location: [facility] ORS;  Service: Urology;  Laterality: Bilateral;   . Transurethral resection of bladder tumor with gyrus (turbt-gyrus) N/A 10/16/2014    Procedure: TRANSURETHRAL RESECTION OF BLADDER TUMOR WITH GYRUS (TURBT-GYRUS);  Surgeon: Malka So, MD;  Location: [facility]S;  Service: Urology;  Laterality: N/A;  . Cataract extraction w/phaco Right 03/25/2015    Procedure: CATARACT EXTRACTION PHACO AND INTRAOCULAR LENS PLACEMENT RIGHT EYE CDE=12.24;  Surgeon: Tonny Branch, MD;  Location: [facility];  Service: Ophthalmology;  Laterality: Right;    Family History  Problem Relation Age of Onset  . Asthma Father 77    DECEASED  . Diabetes      Social History:  reports that he has never smoked. He has never used smokeless tobacco. He reports that he does not drink alcohol or use illicit drugs.  Allergies: No Known Allergies  Medications: I have reviewed the patient's current medications.  Results for orders placed or performed during the hospital encounter of 05/20/15 (from the past 48 hour(s))  Glucose, capillary     Status: Abnormal   Collection Time: 05/20/15  6:28 PM  Result Value Ref Range   Glucose-Capillary 194 (H) 65 - 99 mg/dL  CBC     Status: Abnormal   Collection Time: 05/20/15  6:33 PM  Result Value Ref Range   WBC 6.7 4.0 - 10.5 K/uL   RBC 3.59 (L) 4.22 - 5.81 MIL/uL   Hemoglobin 9.3 (L) 13.0 - 17.0 g/dL   HCT 29.2 (L) 39.0 - 52.0 %   MCV 81.3 78.0 - 100.0 fL   MCH 25.9 (L) 26.0 - 34.0  pg   MCHC 31.8 30.0 - 36.0 g/dL   RDW 15.3 11.5 - 15.5 %   Platelets 297 150 - 400 K/uL  Comprehensive metabolic panel     Status: Abnormal   Collection Time: 05/20/15  6:33 PM  Result Value Ref Range   Sodium 136 135 - 145 mmol/L   Potassium 3.6 3.5 - 5.1 mmol/L   Chloride 101 101 - 111 mmol/L   CO2 25 22 - 32 mmol/L   Glucose, Bld 213 (H) 65 - 99 mg/dL   BUN 34 (H) 6 - 20 mg/dL   Creatinine, Ser 1.58 (H) 0.61 - 1.24 mg/dL   Calcium 8.3 (L) 8.9 - 10.3 mg/dL   Total Protein 5.9 (L) 6.5 - 8.1 g/dL   Albumin 1.5 (L) 3.5 - 5.0 g/dL   AST 20 15 - 41 U/L    ALT 16 (L) 17 - 63 U/L   Alkaline Phosphatase 174 (H) 38 - 126 U/L   Total Bilirubin 1.0 0.3 - 1.2 mg/dL   GFR calc non Af Amer 48 (L) >60 mL/min   GFR calc Af Amer 55 (L) >60 mL/min    Comment: (NOTE) The eGFR has been calculated using the CKD EPI equation. This calculation has not been validated in all clinical situations. eGFR's persistently <60 mL/min signify possible Chronic Kidney Disease.    Anion gap 10 5 - 15  Glucose, capillary     Status: Abnormal   Collection Time: 05/20/15  9:31 PM  Result Value Ref Range   Glucose-Capillary 213 (H) 65 - 99 mg/dL  Surgical pcr screen     Status: None   Collection Time: 05/20/15 11:54 PM  Result Value Ref Range   MRSA, PCR NEGATIVE NEGATIVE   Staphylococcus aureus NEGATIVE NEGATIVE    Comment:        The Xpert SA Assay (FDA approved for NASAL specimens in patients over 56 years of age), is one component of a comprehensive surveillance program.  Test performance has been validated by [facility] for patients greater than or equal to 29 year old. It is not intended to diagnose infection nor to guide or monitor treatment.     Dg Foot Complete Right  05/20/2015   CLINICAL DATA:  Open heel sore for 2 weeks. Evaluate for osteomyelitis. Initial encounter.  EXAM: RIGHT FOOT COMPLETE - 3+ VIEW  COMPARISON:  05/20/2015 and 11/12/2014.  FINDINGS: Diffuse soft tissue swelling and soft tissue emphysema are again noted, greatest within the hindfoot. There is fragmentation and collapse of the talar dome. Irregularity of the tibial plafond and and both malleoli was better demonstrated on the ankle radiographs done earlier today. No acute osseous findings are seen within the midfoot or forefoot.  IMPRESSION: No significant change from radiographs done earlier today. Some of the osseous findings may be secondary to Charcot arthropathy, although osteomyelitis remains a concern, especially given the associated soft tissue emphysema.   Electronically  Signed   By: Richardean Sale M.D.   On: 05/20/2015 19:41    Review of Systems  All other systems reviewed and are negative.  Blood pressure 105/71, pulse 61, temperature 97.6 F (36.4 C), temperature source Oral, resp. rate 18, height 5' 11"  (1.803 m), weight 113.626 kg (250 lb 8 oz), SpO2 97 %. Physical Exam On examination patient has cellulitis extending halfway up his calf. He has a large ulcer over the fibula with exposed bone there is necrotic gangrenous tissue there is foul draining abscess. 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  Plan: Due to patient's extensive nature of the ulceration and infection patient does not have any limb salvage options at this time. We'll plan for a right transtibial amputation. Risks and benefits were discussed patient states he understands wishes to proceed at this time.  [name] V 05/21/2015, 7:02 AM

## 2015-05-21 NOTE — Anesthesia Procedure Notes (Signed)
Procedure Name: Intubation Date/Time: 05/21/2015 7:59 PM Performed by: Shirlyn Goltz Pre-anesthesia Checklist: Patient identified, Emergency Drugs available, Suction available and Patient being monitored Patient Re-evaluated:Patient Re-evaluated prior to inductionOxygen Delivery Method: Circle system utilized Preoxygenation: Pre-oxygenation with 100% oxygen Intubation Type: IV induction Ventilation: Mask ventilation without difficulty and Oral airway inserted - appropriate to patient size Laryngoscope Size: Mac and 4 Grade View: Grade II Tube type: Oral Tube size: 7.5 mm Number of attempts: 1 Airway Equipment and Method: Stylet Placement Confirmation: ETT inserted through vocal cords under direct vision,  positive ETCO2 and breath sounds checked- equal and bilateral Secured at: 21 cm Tube secured with: Tape Dental Injury: Teeth and Oropharynx as per pre-operative assessment

## 2015-05-21 NOTE — Anesthesia Preprocedure Evaluation (Addendum)
Anesthesia Evaluation  Patient identified by MRN, date of birth, ID band Patient awake    Reviewed: Allergy & Precautions, H&P , NPO status , Patient's Chart, lab work & pertinent test results  Airway Mallampati: III  TM Distance: >3 FB Neck ROM: full    Dental  (+) Edentulous Upper, Poor Dentition, Dental Advisory Given   Pulmonary sleep apnea and Continuous Positive Airway Pressure Ventilation ,    Pulmonary exam normal breath sounds clear to auscultation       Cardiovascular Exercise Tolerance: Poor hypertension, Pt. on medications + Peripheral Vascular Disease and +CHF  + dysrhythmias Atrial Fibrillation + Cardiac Defibrillator  Rhythm:Irregular Rate:Normal  Bifascicular block.  EF 20 - 25% echo 5/13. Non-ischemic cardiomyopathy.  Pulmonary hypertension. Incomplete LBBB.  ECG 2/28  AF with RVR   Neuro/Psych negative neurological ROS  negative psych ROS   GI/Hepatic negative GI ROS, Neg liver ROS,   Endo/Other  diabetes, Well Controlled, Type 2, Oral Hypoglycemic Agents, Insulin Dependent  Renal/GU Renal InsufficiencyRenal disease     Musculoskeletal   Abdominal   Peds  Hematology negative hematology ROS (+) anemia , hgb 10.2   Anesthesia Other Findings   Reproductive/Obstetrics negative OB ROS                            Anesthesia Physical  Anesthesia Plan  ASA: IV  Anesthesia Plan: General   Post-op Pain Management:    Induction: Intravenous  Airway Management Planned: LMA  Additional Equipment:   Intra-op Plan:   Post-operative Plan:   Informed Consent: I have reviewed the patients History and Physical, chart, labs and discussed the procedure including the risks, benefits and alternatives for the proposed anesthesia with the patient or authorized representative who has indicated his/her understanding and acceptance.   Dental advisory given  Plan Discussed with:  CRNA  Anesthesia Plan Comments:        Anesthesia Quick Evaluation

## 2015-05-21 NOTE — Progress Notes (Signed)
Report called to [place] on 6e. Informed of pt last cbg of 103 at 1750. He did have a cbg of 71 at 1732 and was given a 1/2 amp of d50

## 2015-05-21 NOTE — Progress Notes (Signed)
CBG 71 Dr Kalman Shan called and informed new orders noted.

## 2015-05-21 NOTE — Progress Notes (Signed)
Pt CBG 69 , pt NPO for surgery @ 1700, gave 25 mg D50 will re-check.

## 2015-05-21 NOTE — Progress Notes (Signed)
CBG 61 gave 25 mg d50 will re-check. Pt asyptomatic

## 2015-05-21 NOTE — Progress Notes (Signed)
Placed on tele to monitor pt 

## 2015-05-21 NOTE — Progress Notes (Signed)
[facility]                                                                              Patient Demographics  [name], is a 54 y.o. male, DOB - 1961-04-28, SEG:[id number]  Admit date - 05/20/2015   Admitting Physician Verlee Monte, MD  Outpatient Primary MD for the patient is [facility], MD  LOS - 1   Chief complaint:  worsening chronic right foot ulcer      Brief HPI    Patient is a 55 year old male with diabetes mellitus on insulin, and chronic CHF systolic, EF 73% per echo in 9/15, atrial fibrillation on pradaxa , hypertension, hyperlipidemia, chronic right foot ulcer, has been following Dr. Sharol Given outpatient presented to [facility] ED with worsening right foot ulcer. History was obtained from the patient who reported that he has the right foot ulcer for the last 2 months. He had been wearing orthopedics brace to walk which was rubbing too close to the ankle and made the ulcer worse. He was seen by Dr. Sharol Given in the office a month ago and per patient was placed on doxycycline for 3 weeks. Patient noticed that in the last few days the ulcer was becoming more foul-smelling, draining, increasing in size and patient was having chills. He went to the ER today and patient was sent to [facility] for further management.  Assessment & Plan     Principal Problem:  Right foot ulcer ([place]) with osteomyelitis with underlying uncontrolled diabetes mellitus on insulin - xray report showed soft tissue emphysema, possible osteomyelitis especially given the associated soft tissue emphysema - Continue IV vancomycin and Zosyn, blood cultures, EKG showed a paced rhythm no acute changes - Appreciate orthopedic recommendations, Dr. Sharol Given, OR today  - Continue to hold pradaxa  Active Problems: Insulin-dependent diabetes mellitus - Hemoglobin A1c 9.0 - place on Levemir 50units qhs rather than home BID dosing (as patient is NPO after midnight), sliding scale insulin and meal  coverage, will adjust insulin as needed tomorrow   OBESITY-MORBID (>100') - Patient strongly counseled on diet and weight control   Essential hypertension - Currently stable, continue metoprolol, clonidine, prazosin   Atrial flutter  - Currently rate controlled continue metoprolol, digoxin - HOLD pradaxa for the surgery    Chronic systolic dysfunction of left ventricle - Currently compensated, EF 35% per 2-D echo in 9/15 - Creatinine function 1.5, continue to hold spironolactone and ACEI, continue torsemide, beta blocker.    HLD (hyperlipidemia) - Continue statin  DVT prophylaxis: holding Pradaxa. Will restart after the surgery tomorrow.  Code Status: Full CODE STATUS  Family Communication: Discussed in detail with the patient, all imaging results, lab results explained to the patient    Disposition Plan: Not medically ready, may need rehabilitation after the surgery, patient lives alone  Time Spent in minutes  25 minutes  Procedures  X-ray of the foot  Consults   Orthopedics  DVT Prophylaxis  pradaxa on hold   Medications  Scheduled Meds: . allopurinol  300 mg Oral Daily  . atorvastatin  10 mg Oral q morning - 10a  . cloNIDine  0.2 mg Oral  BID  . digoxin  0.125 mg Oral Daily  . feeding supplement (ENSURE ENLIVE)  237 mL Oral BID BM  . gemfibrozil  600 mg Oral BID AC  . insulin aspart  0-15 Units Subcutaneous TID WC  . insulin aspart  0-5 Units Subcutaneous QHS  . insulin aspart  4 Units Subcutaneous TID WC  . insulin detemir  50 Units Subcutaneous QHS  . irbesartan  75 mg Oral Daily  . metoprolol  75 mg Oral BID  . piperacillin-tazobactam (ZOSYN)  IV  3.375 g Intravenous 3 times per day  . prazosin  1 mg Oral BID  . sodium chloride  3 mL Intravenous Q12H  . torsemide  20 mg Oral BID  . vancomycin  750 mg Intravenous Q12H   Continuous Infusions:  PRN Meds:.acetaminophen **OR** acetaminophen, HYDROcodone-acetaminophen, HYDROmorphone (DILAUDID)  injection, ondansetron **OR** ondansetron (ZOFRAN) IV   Antibiotics   Anti-infectives    Start     Dose/Rate Route Frequency Ordered Stop   05/20/15 2300  vancomycin (VANCOCIN) 500 mg in sodium chloride 0.9 % 100 mL IVPB  Status:  Discontinued     500 mg 100 mL/hr over 60 Minutes Intravenous Every 12 hours 05/20/15 2104 05/20/15 2150   05/20/15 2300  vancomycin (VANCOCIN) IVPB 750 mg/150 ml premix     750 mg 150 mL/hr over 60 Minutes Intravenous Every 12 hours 05/20/15 2150     05/20/15 2100  vancomycin (VANCOCIN) 500 mg in sodium chloride 0.9 % 100 mL IVPB  Status:  Discontinued     500 mg 100 mL/hr over 60 Minutes Intravenous Every 12 hours 05/20/15 2028 05/20/15 2104   05/20/15 2000  vancomycin (VANCOCIN) IVPB 750 mg/150 ml premix  Status:  Discontinued     750 mg 150 mL/hr over 60 Minutes Intravenous Every 8 hours 05/20/15 1756 05/20/15 2028   05/20/15 1830  piperacillin-tazobactam (ZOSYN) IVPB 3.375 g     3.375 g 12.5 mL/hr over 240 Minutes Intravenous 3 times per day 05/20/15 1756          Subjective:   [name] was seen and examined today. Awaiting surgery otherwise no complaints. Patient denies dizziness, chest pain, shortness of breath, abdominal pain, N/V/D/C, new weakness, numbess, tingling. No acute events overnight.    Objective:   Blood pressure 137/74, pulse 63, temperature 97.8 F (36.6 C), temperature source Oral, resp. rate 18, height 5\' 11"  (1.803 m), weight 113.626 kg (250 lb 8 oz), SpO2 98 %.  Wt Readings from Last 3 Encounters:  05/21/15 113.626 kg (250 lb 8 oz)  04/16/15 122.018 kg (269 lb)  03/13/15 123.288 kg (271 lb 12.8 oz)     Intake/Output Summary (Last 24 hours) at 05/21/15 1121 Last data filed at 05/21/15 1000  Gross per 24 hour  Intake    440 ml  Output   1226 ml  Net   -786 ml    Exam  General: Alert and oriented x 3, NAD  HEENT:  PERRLA, EOMI, Anicteric Sclera, mucous membranes moist.   Neck: Supple, no JVD, no  masses  CVS: S1 S2 auscultated, no rubs, murmurs or gallops. Regular rate and rhythm.  Respiratory: Clear to auscultation bilaterally, no wheezing, rales or rhonchi  Abdomen: Soft, nontender, nondistended, + bowel sounds  Ext: no cyanosis clubbing. right foot dressing intact  Neuro: AAOx3, Cr N's II- XII. Strength 5/5 upper and lower extremities bilaterally  Skin: No rashes  Psych: Normal affect and demeanor, alert and oriented x3    Data Review  Micro Results Recent Results (from the past 240 hour(s))  Surgical pcr screen     Status: None   Collection Time: 05/20/15 11:54 PM  Result Value Ref Range Status   MRSA, PCR NEGATIVE NEGATIVE Final   Staphylococcus aureus NEGATIVE NEGATIVE Final    Comment:        The Xpert SA Assay (FDA approved for NASAL specimens in patients over 69 years of age), is one component of a comprehensive surveillance program.  Test performance has been validated by [facility] for patients greater than or equal to 73 year old. It is not intended to diagnose infection nor to guide or monitor treatment.     Radiology Reports Dg Foot Complete Right  05/20/2015   CLINICAL DATA:  Open heel sore for 2 weeks. Evaluate for osteomyelitis. Initial encounter.  EXAM: RIGHT FOOT COMPLETE - 3+ VIEW  COMPARISON:  05/20/2015 and 11/12/2014.  FINDINGS: Diffuse soft tissue swelling and soft tissue emphysema are again noted, greatest within the hindfoot. There is fragmentation and collapse of the talar dome. Irregularity of the tibial plafond and and both malleoli was better demonstrated on the ankle radiographs done earlier today. No acute osseous findings are seen within the midfoot or forefoot.  IMPRESSION: No significant change from radiographs done earlier today. Some of the osseous findings may be secondary to Charcot arthropathy, although osteomyelitis remains a concern, especially given the associated soft tissue emphysema.   Electronically Signed   By:  Richardean Sale M.D.   On: 05/20/2015 19:41    CBC  Recent Labs Lab 05/20/15 1833 05/21/15 0721  WBC 6.7 6.3  HGB 9.3* 9.1*  HCT 29.2* 28.5*  PLT 297 294  MCV 81.3 81.7  MCH 25.9* 26.1  MCHC 31.8 31.9  RDW 15.3 15.5    Chemistries   Recent Labs Lab 05/20/15 1833 05/21/15 0721  NA 136 137  K 3.6 3.2*  CL 101 103  CO2 25 24  GLUCOSE 213* 111*  BUN 34* 39*  CREATININE 1.58* 1.55*  CALCIUM 8.3* 8.2*  MG  --  1.8  AST 20  --   ALT 16*  --   ALKPHOS 174*  --   BILITOT 1.0  --    ------------------------------------------------------------------------------------------------------------------ estimated creatinine clearance is 69 mL/min (by C-G formula based on Cr of 1.55). ------------------------------------------------------------------------------------------------------------------  Recent Labs  05/20/15 1833  HGBA1C 9.0*   ------------------------------------------------------------------------------------------------------------------ No results for input(s): CHOL, HDL, LDLCALC, TRIG, CHOLHDL, LDLDIRECT in the last 72 hours. ------------------------------------------------------------------------------------------------------------------ No results for input(s): TSH, T4TOTAL, T3FREE, THYROIDAB in the last 72 hours.  Invalid input(s): FREET3 ------------------------------------------------------------------------------------------------------------------ No results for input(s): VITAMINB12, FOLATE, FERRITIN, TIBC, IRON, RETICCTPCT in the last 72 hours.  Coagulation profile No results for input(s): INR, PROTIME in the last 168 hours.  No results for input(s): DDIMER in the last 72 hours.  Cardiac Enzymes No results for input(s): CKMB, TROPONINI, MYOGLOBIN in the last 168 hours.  Invalid input(s): CK ------------------------------------------------------------------------------------------------------------------ Invalid input(s): [place]  05/20/15 1828 05/20/15 2131 05/21/15 0758  GLUCAP 194* 213* 101*     Arrington Bencomo M.D. [facility] 05/21/2015, 11:21 AM  Pager: [phone] Between 7am to 7pm - call Pager - [phone]  After 7pm go to [email or web address] - password [email or web address]  Call night coverage person covering after 7pm

## 2015-05-22 ENCOUNTER — Encounter (HOSPITAL_COMMUNITY): Payer: Self-pay | Admitting: Orthopedic Surgery

## 2015-05-22 DIAGNOSIS — Z89511 Acquired absence of right leg below knee: Secondary | ICD-10-CM

## 2015-05-22 LAB — BASIC METABOLIC PANEL
ANION GAP: 7 (ref 5–15)
BUN: 29 mg/dL — ABNORMAL HIGH (ref 6–20)
CALCIUM: 8.2 mg/dL — AB (ref 8.9–10.3)
CO2: 28 mmol/L (ref 22–32)
Chloride: 104 mmol/L (ref 101–111)
Creatinine, Ser: 1.49 mg/dL — ABNORMAL HIGH (ref 0.61–1.24)
GFR, EST AFRICAN AMERICAN: 59 mL/min — AB (ref >60)
GFR, EST NON AFRICAN AMERICAN: 51 mL/min — AB (ref >60)
Glucose, Bld: 230 mg/dL — ABNORMAL HIGH (ref 65–99)
POTASSIUM: 4.3 mmol/L (ref 3.5–5.1)
SODIUM: 139 mmol/L (ref 135–145)

## 2015-05-22 LAB — GLUCOSE, CAPILLARY
GLUCOSE-CAPILLARY: 87 mg/dL (ref 65–99)
GLUCOSE-CAPILLARY: 103 mg/dL — AB (ref 65–99)
GLUCOSE-CAPILLARY: 82 mg/dL (ref 65–99)
GLUCOSE-CAPILLARY: 190 mg/dL — AB (ref 65–99)
GLUCOSE-CAPILLARY: 195 mg/dL — AB (ref 65–99)
Glucose-Capillary: 71 mg/dL (ref 65–99)
Glucose-Capillary: 226 mg/dL — ABNORMAL HIGH (ref 65–99)
Glucose-Capillary: 216 mg/dL — ABNORMAL HIGH (ref 65–99)

## 2015-05-22 LAB — CBC
HCT: 28 % — ABNORMAL LOW (ref 39.0–52.0)
Hemoglobin: 9 g/dL — ABNORMAL LOW (ref 13.0–17.0)
MCH: 26.9 pg (ref 26.0–34.0)
MCHC: 32.1 g/dL (ref 30.0–36.0)
MCV: 83.6 fL (ref 78.0–100.0)
PLATELETS: 310 10*3/uL (ref 150–400)
RBC: 3.35 MIL/uL — AB (ref 4.22–5.81)
RDW: 15.6 % — AB (ref 11.5–15.5)
WBC: 7.9 10*3/uL (ref 4.0–10.5)

## 2015-05-22 MED ORDER — INSULIN ASPART 100 UNIT/ML ~~LOC~~ SOLN
6.0000 [IU] | Freq: Three times a day (TID) | SUBCUTANEOUS | Status: DC
  Administered 2015-05-22 – 2015-05-23 (×2): 6 [IU] via SUBCUTANEOUS

## 2015-05-22 MED ORDER — PRO-STAT SUGAR FREE PO LIQD
30.0000 mL | Freq: Two times a day (BID) | ORAL | Status: DC
  Administered 2015-05-22 – 2015-05-23 (×3): 30 mL via ORAL
  Filled 2015-05-22 (×3): qty 30

## 2015-05-22 MED ORDER — GLUCERNA SHAKE PO LIQD
237.0000 mL | Freq: Every day | ORAL | Status: DC
  Administered 2015-05-22: 237 mL via ORAL

## 2015-05-22 MED ORDER — INSULIN DETEMIR 100 UNIT/ML ~~LOC~~ SOLN
60.0000 [IU] | Freq: Every day | SUBCUTANEOUS | Status: DC
  Administered 2015-05-22: 60 [IU] via SUBCUTANEOUS
  Filled 2015-05-22 (×2): qty 0.6

## 2015-05-22 MED ORDER — GLUCERNA SHAKE PO LIQD: 237.0000 mL | ORAL | Status: DC

## 2015-05-22 MED ORDER — DABIGATRAN ETEXILATE MESYLATE 150 MG PO CAPS
150.0000 mg | ORAL_CAPSULE | Freq: Two times a day (BID) | ORAL | Status: DC
  Administered 2015-05-22 – 2015-05-23 (×3): 150 mg via ORAL
  Filled 2015-05-22 (×5): qty 1

## 2015-05-22 NOTE — Clinical Documentation Improvement (Signed)
Hospitalist  Can the abnormal labs findings be further specified?   CKD Stage I - GFR greater than or equal to 90  CKD Stage II - GFR 60-89  CKD Stage III - GFR 30-59  CKD Stage IV - GFR 15-29  CKD Stage V - GFR < 15  ESRD (End Stage Renal Disease)  Other condition  Unable to clinically determine   Supporting Information: : (risk factors, signs and symptoms, diagnostics, treatment) Results for SARATH, PRIVOTT (MRN [id number]) as of 05/22/2015 10:42  05/20/2015 18:33 05/21/2015 07:21 05/21/2015 18:48 05/22/2015 04:35  BUN 34 (H) 39 (H) 31 (H) 29 (H)  Creatinine 1.58 (H) 1.55 (H) 1.33 (H) 1.49 (H)  EGFR (Non-African Amer.) 48 (L) 49 (L) 59 (L) 51 (L)  05/22/15: Daily BMPs;  0/9% NS @ 10cc/ hr continuous IV  Please exercise your independent, professional judgment when responding. A specific answer is not anticipated or expected.  Thank You, Ermelinda Das, RN, BSN, [place] Certified Clinical Documentation Specialist [place]: Health Information Management [phone]

## 2015-05-22 NOTE — Progress Notes (Signed)
Inpatient Diabetes Program Recommendations  AACE/ADA: New Consensus Statement on Inpatient Glycemic Control (2015)  Target Ranges:  Prepandial:   less than 140 mg/dL      Peak postprandial:   less than 180 mg/dL (1-2 hours)      Critically ill patients:  140 - 180 mg/dL  Results for BRYSUN, ESCHMANN (MRN [id number]) as of 05/22/2015 10:14  Ref. Range 05/20/2015 21:31 05/21/2015 07:58 05/21/2015 11:16 05/21/2015 12:15 05/21/2015 15:12 05/21/2015 21:29 05/22/2015 07:43  Glucose-Capillary Latest Ref Range: 65-99 mg/dL 213 (H) 101 (H) 69 125 (H) 61 (L) 73 226 (H)   Review of Glycemic Control  Current orders for Inpatient glycemic control: Levemir 50 units QHS, Novolog 0-15 units TID with meals, Novolog 0-5 units HS, Novolog 4 units TID with meals for meal coverage  Inpatient Diabetes Program Recommendations: Insulin - Basal: In reviewing the chart, noted that patient did NOT receive Levemir last night. As a result fasting glucose is noted to be elevated today at 226 mg/dl. Do not recommend any changes with basal insulin at this time. Will continue to follow.  Thanks, Barnie Alderman, RN, MSN, CCRN, CDE Diabetes Coordinator Inpatient Diabetes Program [phone] (Team Pager from [place] to [place]) [phone] ([facility] office) [phone] [facility] office) [phone] [facility] office)

## 2015-05-22 NOTE — Evaluation (Signed)
Physical Therapy Evaluation Patient Details Name: [name] MRN: [id number] DOB: 1960/09/28 Today's Date: 05/22/2015   History of Present Illness  S/p R BKA;  has a past medical history of Hypertension; Morbid obesity ([place]); Atrial flutter ([place]) ( 10/01/2006, 12/30/11); OSA (obstructive sleep apnea); Nonischemic cardiomyopathy ([place]); Chronic systolic dysfunction of left ventricle; Unspecified disorder of kidney and ureter; RBBB, anterior fascicular block and incomplete LBBB; Pulmonary hypertension ([place]); Valvular heart disease; and Type II or unspecified type diabetes mellitus without mention of complication, not stated as uncontrolled.  Clinical Impression   Patient is s/p above surgery resulting in functional limitations due to the deficits listed below (see PT Problem List).  Patient will benefit from skilled PT to increase their independence and safety with mobility to allow discharge to the venue listed below.    Pt is young, was independent prior, and with intensive PT/OT at [facility], will likely reach modified independent level       Follow Up Recommendations [facility]    Equipment Recommendations  Rolling walker with 5" wheels;3in1 (PT);Wheelchair (measurements PT);Wheelchair cushion (measurements PT)    Recommendations for Other Services Rehab consult;OT consult     Precautions / Restrictions Precautions Precautions: Fall Restrictions Weight Bearing Restrictions: Yes RLE Weight Bearing: Non weight bearing      Mobility  Bed Mobility Overal bed mobility: Needs Assistance Bed Mobility: Supine to Sit     Supine to sit: HOB elevated;Mod assist     General bed mobility comments: mod handheld assist to pull to sit once LEs cleared EOB; trunk girth somewhat limiting anterior weight shift and ability to pull forward to sit up  Transfers Overall transfer level: Needs assistance Equipment used: Rolling walker (2 wheeled) Transfers: Sit to/from [facility] Sit  to Stand: Mod assist Stand pivot transfers: Mod assist       General transfer comment: Cues for technqiue, safety and hand placement; mod assist to rise; demo cues to "heel-toe pivot" on L eft foot; minimal hopping to chair with rW  Ambulation/Gait                Stairs            Wheelchair Mobility    Modified Rankin (Stroke Patients Only)       Balance Overall balance assessment: Needs assistance   Sitting balance-Leahy Scale: Fair       Standing balance-Leahy Scale: Poor                               Pertinent Vitals/Pain Pain Assessment: No/denies pain (reports phantom sensation, "wiggling toes")    Home Living Family/patient expects to be discharged to:: Private residence Living Arrangements: Alone Available Help at Discharge: Family;Available PRN/intermittently Type of Home: House Home Access: Stairs to enter Entrance Stairs-Rails: None Entrance Stairs-Number of Steps: 2 Home Layout: One level Home Equipment: None      Prior Function Level of Independence: Independent               Hand Dominance        Extremity/Trunk Assessment   Upper Extremity Assessment: Defer to OT evaluation           Lower Extremity Assessment: RLE deficits/detail RLE Deficits / Details: BKA; noted full extension and flexion range (slightly limited by bulky dressing)       Communication   Communication: No difficulties  Cognition Arousal/Alertness: Awake/alert Behavior During Therapy: WFL for tasks assessed/performed Overall Cognitive  Status: Within Functional Limits for tasks assessed                      General Comments      Exercises        Assessment/Plan    PT Assessment Patient needs continued PT services  PT Diagnosis Difficulty walking   PT Problem List Decreased strength;Decreased range of motion;Decreased activity tolerance;Decreased balance;Decreased mobility;Decreased coordination;Decreased knowledge  of use of DME;Decreased knowledge of precautions  PT Treatment Interventions DME instruction;Gait training;Stair training;Functional mobility training;Therapeutic activities;Therapeutic exercise;Balance training;Patient/family education   PT Goals (Current goals can be found in the Care Plan section) Acute Rehab PT Goals Patient Stated Goal: Want to be able to walk on a prosthesis PT Goal Formulation: With patient Time For Goal Achievement: 06/05/15    Frequency Min 3X/week   Barriers to discharge Decreased caregiver support Pt's mother can give some assist, but not 24 hour; Still, pt is young, was independent prior, and with intensive PT/OT at [facility], will likely reach modified independent level    Co-evaluation               End of Session Equipment Utilized During Treatment: Gait belt Activity Tolerance: Patient tolerated treatment well Patient left: in chair;with call bell/phone within reach;with chair alarm set Nurse Communication: Mobility status         Time: 7048-8891 PT Time Calculation (min) (ACUTE ONLY): 24 min   Charges:   PT Evaluation $Initial PT Evaluation Tier I: 1 Procedure PT Treatments $Therapeutic Activity: 8-22 mins   PT G Codes:        Quin Hoop 05/22/2015, 9:59 AM  Roney Marion, PT  Acute Rehabilitation Services Pager [phone] Office [phone]

## 2015-05-22 NOTE — Progress Notes (Signed)
05/22/2015 3:10 PM  CRITICAL VALUE ALERT  Critical value received:  Positive wound and blood culture  Date of notification:  05/22/2015  Time of notification:  1440  Critical value read back:Yes.    Nurse who received alert:  Maggie Font  MD notified (1st page):  Rai MD  Time of first page:  1510  Responding MD:  Rai MD   Time MD responded:  1512  Comments: No new orders at this time. Patient already on IV ABTs. Will continue to monitor and assess the patient.   [employer], RN-BC, [employer] [facility] Phone [phone]

## 2015-05-22 NOTE — Clinical Documentation Improvement (Signed)
  Hospitalist  Can the abnormal lab findings be further specified?   Acute Renal Failure/Acute Kidney Injury  Acute Tubular Necrosis  Acute Renal Cortical Necrosis  Acute Renal Medullary Necrosis  Acute on Chronic Renal Failure  Chronic Renal Failure  Other  Clinically Undetermined  Document any associated diagnoses/conditions.  Supporting Information:  Results for [name], [name] (MRN [id number]) as of 05/22/2015 10:42  05/20/2015 18:33 05/21/2015 07:21 05/21/2015 18:48 05/22/2015 04:35  BUN 34 (H) 39 (H) 31 (H) 29 (H)  Creatinine 1.58 (H) 1.55 (H) 1.33 (H) 1.49 (H)  EGFR (Non-African Amer.) 48 (L) 49 (L) 59 (L) 51 (L)  05/22/15: Daily BMPs; 0/9% NS @ 10cc/ hr continuous IV     Please exercise your independent, professional judgment when responding. A specific answer is not anticipated or expected.  Thank You,  Ermelinda Das, RN, BSN, [place] Certified Clinical Documentation Specialist [place]: Health Information Management [phone]

## 2015-05-22 NOTE — Progress Notes (Signed)
Nutrition Follow-up  DOCUMENTATION CODES:   Obesity unspecified  INTERVENTION:  - Discontinue Ensure Enlive. Order Glucerna once daily (Each supplement provides 220 kcal, and 10 g protein).  - Order Pro-Stat 30 mL BID. (Each supplement provides 100 kcal, and 15 g protein).  - Continue to monitor patient for needs.    NUTRITION DIAGNOSIS:   Other (Comment) (excessive carbohydrate intake) related to limited prior education as evidenced by per patient/family report (verbalizes inaccurate or incomplete information).  Progressing.   GOAL:   Patient will meet greater than or equal to 90% of their needs  Progressing.   MONITOR:   PO intake, Supplement acceptance, Diet advancement, Labs, Weight trends, Skin, I & O's  REASON FOR ASSESSMENT:   Malnutrition Screening Tool    ASSESSMENT:   54 y.o. male, admitted to hospital for right food ulcer. Previous medical history of Dm, chronic CHF systolic, EF 44% per echo, A.Fib, HTN, hyperlipidemia, and chronic right foot ulcer. Patient currently NPO and prepping for surgery.   Follow-up visit for patient after diet order has been advanced from NPO to Carb Modified, moderate kcal (1600-2000 kcal). Patient sitting up-right in chair beside window at time of visit, very ameneable.   He was drinking an Ensure Enlive, Strawberry brought to him by nursing staff during visit. Encouraged patient to continue to eat high protein foods to assist with healing and to provide a healthy breakfast selection. Due to recent high CBG labs today, will order Glucerna for patient to help manage glucose, capillary. Patient is also open to trying Pro-Stat oral supplement, cherry flavor. Will order BID to help meet protein requirements.    Anthropometrics updated to account for recent amputation.   Labs reviewed: high CBG (190-226), low RBC (3.35), low Hemoglobin (9.0), low HCT (28.0), low Ca (8.2), high BUN (29), high Cr (1.49).   Medications reviewed.  Diet  Order:  Diet Carb Modified Fluid consistency:: Thin; Room service appropriate?: Yes  Skin:  Wound (see comment) (diabetic ulcer, right ankle)  Last BM:  05/20/2015  Height:   Ht Readings from Last 1 Encounters:  05/21/15 5\' 11"  (1.803 m)    Weight:   Wt Readings from Last 1 Encounters:  05/21/15 250 lb 8 oz (113.626 kg)    Ideal Body Weight:  78.2 kg  BMI:  Body mass index is 34.95 kg/(m^2).  Estimated Nutritional Needs:   Kcal:  2100-2300  Protein:  135-150 grams  Fluid:  2.1 - 2.3 L/day  EDUCATION NEEDS:   Education needs addressed  Kayleen Memos, BA. BS Dietetic Intern 05/22/2015 11:44 AM

## 2015-05-22 NOTE — Progress Notes (Signed)
[facility]                                                                              Patient Demographics  [name], is a 54 y.o. male, DOB - 1960-09-17, LKG:[id number]  Admit date - 05/20/2015   Admitting Physician Verlee Monte, MD  Outpatient Primary MD for the patient is [facility], MD  LOS - 2   Chief complaint:  worsening chronic right foot ulcer      Brief HPI    Patient is a 54 year old male with diabetes mellitus on insulin, and chronic CHF systolic, EF 66% per echo in 9/15, atrial fibrillation on pradaxa , hypertension, hyperlipidemia, chronic right foot ulcer, has been following Dr. Sharol Given outpatient presented to [facility] ED with worsening right foot ulcer. History was obtained from the patient who reported that he has the right foot ulcer for the last 2 months. He had been wearing orthopedics brace to walk which was rubbing too close to the ankle and made the ulcer worse. He was seen by Dr. Sharol Given in the office a month ago and per patient was placed on doxycycline for 3 weeks. Patient noticed that in the last few days the ulcer was becoming more foul-smelling, draining, increasing in size and patient was having chills. He went to the ER today and patient was sent to [facility] for further management.  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 -Increase Levemir to 60units, meal coverage and  sliding scale insulin    OBESITY-MORBID (>100') - Patient strongly counseled on diet and weight control   Essential hypertension - Currently stable, continue metoprolol, clonidine, prazosin   Atrial flutter  - Currently rate controlled continue metoprolol, digoxin - HOLD pradaxa for the surgery    Chronic systolic dysfunction of left ventricle - Currently compensated, EF 35% per 2-D echo in 9/15 - Creatinine function 1.4, continue to hold spironolactone and ACEI, continue torsemide, beta blocker.    HLD (hyperlipidemia) - Continue statin  DVT prophylaxis:  Pradaxa  Code Status: Full CODE STATUS  Family Communication: Discussed in detail with the patient, all imaging results, lab results explained to the patient    Disposition Plan: Not medically ready, rehabilitation after the surgery, patient lives alone  Time Spent in minutes  25 minutes  Procedures  X-ray of the foot  Consults   Orthopedics   Medications  Scheduled Meds: . allopurinol  300 mg Oral Daily  . atorvastatin  10 mg Oral q morning - 10a  . cloNIDine  0.2 mg Oral BID  . dabigatran  150 mg Oral Q12H  . digoxin  0.125 mg Oral Daily  . [START ON 05/23/2015] feeding supplement (GLUCERNA SHAKE)  237 mL Oral 1 day or 1 dose  . feeding supplement (PRO-STAT SUGAR FREE 64)  30 mL Oral BID  . gemfibrozil  600 mg Oral BID AC  . insulin aspart  0-15 Units Subcutaneous TID WC  . insulin aspart  0-5 Units Subcutaneous QHS  . insulin aspart  4 Units Subcutaneous TID WC  . insulin detemir  50 Units Subcutaneous QHS  . metoprolol  75 mg Oral BID  . multivitamin with minerals  1 tablet Oral Daily  . piperacillin-tazobactam (ZOSYN)  IV  3.375 g Intravenous 3 times per day  . prazosin  1 mg Oral BID  . sodium chloride  3 mL Intravenous Q12H  . torsemide  20 mg Oral BID  . vancomycin  750 mg Intravenous Q12H   Continuous Infusions: . sodium chloride     . dextrose 5 % and 0.45% NaCl 75 mL/hr at 05/22/15 0614   PRN Meds:.acetaminophen **OR** acetaminophen, HYDROcodone-acetaminophen, HYDROmorphone (DILAUDID) injection, meperidine (DEMEROL) injection, methocarbamol **OR** methocarbamol (ROBAXIN)  IV, metoCLOPramide **OR** metoCLOPramide (REGLAN) injection, ondansetron **OR** ondansetron (ZOFRAN) IV, oxyCODONE, promethazine   Antibiotics   Anti-infectives    Start     Dose/Rate Route Frequency Ordered Stop   05/20/15 2300  vancomycin (VANCOCIN) 500 mg in sodium chloride 0.9 % 100 mL IVPB  Status:  Discontinued     500 mg 100 mL/hr over 60 Minutes Intravenous Every 12 hours 05/20/15 2104 05/20/15 2150   05/20/15 2300  vancomycin (VANCOCIN) IVPB 750 mg/150 ml premix     750 mg 150 mL/hr over 60 Minutes Intravenous Every 12 hours 05/20/15 2150     05/20/15 2100  vancomycin (VANCOCIN) 500 mg in sodium chloride 0.9 % 100 mL IVPB  Status:  Discontinued     500 mg 100 mL/hr over 60 Minutes Intravenous Every 12 hours 05/20/15 2028 05/20/15 2104   05/20/15 2000  vancomycin (VANCOCIN) IVPB 750 mg/150 ml premix  Status:  Discontinued     750 mg 150 mL/hr over 60 Minutes Intravenous Every 8 hours 05/20/15 1756 05/20/15 2028   05/20/15 1830  piperacillin-tazobactam (ZOSYN) IVPB 3.375 g     3.375 g 12.5 mL/hr over 240 Minutes Intravenous 3 times per day 05/20/15 1756          Subjective:   [name] was seen and examined today. Postop day #1, doing well. Pain controlled. Patient denies dizziness, chest pain, shortness of breath, abdominal pain, N/V/D/C, new weakness, numbess, tingling. No acute events overnight.    Objective:   Blood pressure 122/71, pulse 64, temperature 97.8 F (36.6 C), temperature source Oral, resp. rate 20, height 5\' 11"  (1.803 m), weight 113.626 kg (250 lb 8 oz), SpO2 97 %.  Wt Readings from Last 3 Encounters:  05/21/15 113.626 kg (250 lb 8 oz)  04/16/15 122.018 kg (269 lb)  03/13/15 123.288 kg (271 lb 12.8 oz)      Intake/Output Summary (Last 24 hours) at 05/22/15 1218 Last data filed at 05/22/15 1030  Gross per 24 hour  Intake 3425.42 ml  Output   1650 ml  Net 1775.42 ml    Exam  General: Alert and oriented x 3, NAD  HEENT:  PERRLA, EOMI,   Neck: Supple, no JVD, no masses  CVS: S1 S2 clear, RRR  Respiratory: CTAB  Abdomen: Soft, nontender, nondistended, + bowel sounds  Ext: no cyanosis clubbing. Right BKA  Neuro: no new deficits  Skin: No  rashes  Psych: Normal affect and demeanor, alert and oriented x3    Data Review   Micro Results Recent Results (from the past 240 hour(s))  Culture, blood (routine x 2)     Status: None (Preliminary result)   Collection Time: 05/20/15  6:33 PM  Result Value Ref Range Status   Specimen Description BLOOD RIGHT ARM  Final   Special Requests BOTTLES DRAWN AEROBIC AND ANAEROBIC 8CC  Final   Culture NO GROWTH 2 DAYS  Final   Report Status PENDING  Incomplete  Culture, blood (routine x 2)     Status: None (Preliminary result)   Collection Time: 05/20/15  6:40 PM  Result Value Ref Range Status   Specimen Description BLOOD RIGHT HAND  Final   Special Requests BOTTLES DRAWN AEROBIC ONLY 10CC  Final   Culture NO GROWTH 2 DAYS  Final   Report Status PENDING  Incomplete  Surgical pcr screen     Status: None   Collection Time: 05/20/15 11:54 PM  Result Value Ref Range Status   MRSA, PCR NEGATIVE NEGATIVE Final   Staphylococcus aureus NEGATIVE NEGATIVE Final    Comment:        The Xpert SA Assay (FDA approved for NASAL specimens in patients over 17 years of age), is one component of a comprehensive surveillance program.  Test performance has been validated by [facility] for patients greater than or equal to 54 year old. It is not intended to diagnose infection nor to guide or monitor treatment.     Radiology Reports Dg Foot Complete Right  05/20/2015   CLINICAL DATA:  Open heel sore for 2 weeks. Evaluate for osteomyelitis.  Initial encounter.  EXAM: RIGHT FOOT COMPLETE - 3+ VIEW  COMPARISON:  05/20/2015 and 11/12/2014.  FINDINGS: Diffuse soft tissue swelling and soft tissue emphysema are again noted, greatest within the hindfoot. There is fragmentation and collapse of the talar dome. Irregularity of the tibial plafond and and both malleoli was better demonstrated on the ankle radiographs done earlier today. No acute osseous findings are seen within the midfoot or forefoot.  IMPRESSION: No significant change from radiographs done earlier today. Some of the osseous findings may be secondary to Charcot arthropathy, although osteomyelitis remains a concern, especially given the associated soft tissue emphysema.   Electronically Signed   By: Richardean Sale M.D.   On: 05/20/2015 19:41    CBC  Recent Labs Lab 05/20/15 1833 05/21/15 0721 05/21/15 1848 05/22/15 0435  WBC 6.7 6.3 7.2 7.9  HGB 9.3* 9.1* 9.1* 9.0*  HCT 29.2* 28.5* 28.3* 28.0*  PLT 297 294 346 310  MCV 81.3 81.7 82.3 83.6  MCH 25.9* 26.1 26.5 26.9  MCHC 31.8 31.9 32.2 32.1  RDW 15.3 15.5 15.6* 15.6*  LYMPHSABS  --   --  1.5  --   MONOABS  --   --  0.6  --   EOSABS  --   --  0.1  --   BASOSABS  --   --  0.1  --     Chemistries   Recent Labs Lab 05/20/15 1833 05/21/15 0721 05/21/15 1848 05/22/15 0435  NA 136 137 139 139  K 3.6 3.2* 3.1* 4.3  CL 101 103 105 104  CO2 25 24 24 28   GLUCOSE 213* 111* 82 230*  BUN 34* 39* 31* 29*  CREATININE 1.58* 1.55* 1.33* 1.49*  CALCIUM 8.3* 8.2* 8.3* 8.2*  MG  --  1.8  --   --   AST 20  --  19  --   ALT 16*  --  15*  --   ALKPHOS 174*  --  159*  --   BILITOT 1.0  --  0.6  --    ------------------------------------------------------------------------------------------------------------------ estimated creatinine clearance is 71.8 mL/min (by C-G formula based on Cr of 1.49). ------------------------------------------------------------------------------------------------------------------  Recent Labs   05/20/15 1833  HGBA1C 9.0*   ------------------------------------------------------------------------------------------------------------------ No results for input(s): CHOL, HDL, LDLCALC, TRIG, CHOLHDL, LDLDIRECT in the last 72 hours. ------------------------------------------------------------------------------------------------------------------ No results for input(s): TSH, T4TOTAL, T3FREE, THYROIDAB in the last 72 hours.  Invalid input(s): FREET3 ------------------------------------------------------------------------------------------------------------------ No results for input(s): VITAMINB12, FOLATE, FERRITIN, TIBC, IRON, RETICCTPCT in the last 72 hours.  Coagulation profile  Recent Labs Lab 05/21/15 1626  INR 2.01*    No results for input(s): DDIMER in the last 72 hours.  Cardiac Enzymes No results for input(s): CKMB, TROPONINI, MYOGLOBIN in the last 168 hours.  Invalid input(s): CK ------------------------------------------------------------------------------------------------------------------ Invalid input(s): [place]  05/21/15 1731 05/21/15 1754 05/21/15 2043 05/21/15 2129 05/22/15 0743 05/22/15 1120  GLUCAP 71 103* 82 73 226* 190*     RAI,RIPUDEEP M.D. [facility] 05/22/2015, 12:18 PM  Pager: [phone] Between 7am to 7pm - call Pager - [phone]  After 7pm go to [email or web address] - password [email or web address]  Call night coverage person covering after 7pm

## 2015-05-22 NOTE — Progress Notes (Signed)
Rehab Admissions Coordinator Note:  Patient was screened by Retta Diones for appropriateness for an Inpatient Acute Rehab Consult.  At this time, an inpatient rehab consult has been ordered and is pending.  I will follow up once consult is completed.   Jodell Cipro M 05/22/2015, 11:23 AM  I can be reached at [phone].

## 2015-05-22 NOTE — Progress Notes (Signed)
Patient ID: [name], male   DOB: 09-06-1960, 54 y.o.   MRN: [id number] Postoperative day 1 right transtibial amputation. Patient denies any pain. Plan for physical therapy progressive ambulation. Orders are written for evaluation for inpatient versus a outpatient rehabilitation.

## 2015-05-22 NOTE — Consult Note (Signed)
Physical Medicine and Rehabilitation Consult Reason for Consult: Right below-knee amputation Referring Physician: [facility]   HPI: [name] is a 54 y.o. right handed male with history of hypertension, atrial flutter with ablation maintained on Pradaxa, nonischemic cardiomyopathy, chronic systolic congestive heart failure, diabetes mellitus and peripheral neuropathy. Patient lives alone in [place]. Used a cane and walker prior to admission. One level home 2 steps to entry. Admitted 05/21/2015 with chronic right ankle foot ulcer followed by Dr. Sharol Given. Noted over the past 3 weeks with increasing foul-smelling odor and drainage from wound. 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 Sharol Given. Hospital course pain management and anemia. Currently remains on broad-spectrum anti-biotics. Acute blood loss anemia 9.0 and monitored. Physical and occupational therapy evaluations are pending. M.D. has requested physical medicine rehabilitation consult.   Review of Systems  Constitutional: Negative for chills.  HENT: Negative for hearing loss.   Eyes: Negative for blurred vision and double vision.  Respiratory: Negative for cough.        Shortness of breath with exertion  Gastrointestinal: Negative for nausea and vomiting.  Genitourinary: Negative for dysuria and hematuria.  Musculoskeletal: Positive for myalgias.  Skin: Negative for rash.  Neurological: Negative for seizures, loss of consciousness and headaches.  All other systems reviewed and are negative.  Past Medical History  Diagnosis Date  . Hypertension   . Morbid obesity ([place])   . Atrial flutter ([place])  10/01/2006, 12/30/11    Status post RF ablation, 7/13, Dr. [name]  . OSA (obstructive sleep apnea)     compliant with CPAP  . Nonischemic cardiomyopathy ([place])     Normal coronary arteries, 2008  . Chronic systolic dysfunction of left ventricle     EF 20-25%, global HK;  mild RVD, 2-D echo, 5/13  . Unspecified disorder of kidney and ureter   . RBBB, anterior fascicular block and incomplete LBBB   . Pulmonary hypertension ([facility])     RVSP 50-55 mmHg, 5/13  . Valvular heart disease     Mild MR/TR, 2-D echo, 5/13  . Type II or unspecified type diabetes mellitus without mention of complication, not stated as uncontrolled    Past Surgical History  Procedure Laterality Date  . Bi-ventricular implantable cardioverter defibrillator  (crt-d)  10/27/2013    MDT [name] XT CRTD implanted by [name] for NICM, CHF  . Atrial flutter ablation N/A 03/04/2012    Procedure: ATRIAL FLUTTER ABLATION;  Surgeon: Thompson Grayer, MD;  Location: [facility] CATH LAB;  Service: Cardiovascular;  Laterality: N/A;  . Bi-ventricular implantable cardioverter defibrillator N/A 10/27/2013    Procedure: BI-VENTRICULAR IMPLANTABLE CARDIOVERTER DEFIBRILLATOR  (CRT-D);  Surgeon: Coralyn Mark, MD;  Location: [facility] CATH LAB;  Service: Cardiovascular;  Laterality: N/A;  . Cystoscopy/retrograde/ureteroscopy Bilateral 10/16/2014    Procedure: Kathrene Alu WITH BILATERAL Freddie Breech WITH GYRUS;  Surgeon: Malka So, MD;  Location: [facility] ORS;  Service: Urology;  Laterality: Bilateral;  . Transurethral resection of bladder tumor with gyrus (turbt-gyrus) N/A 10/16/2014    Procedure: TRANSURETHRAL RESECTION OF BLADDER TUMOR WITH GYRUS (TURBT-GYRUS);  Surgeon: Malka So, MD;  Location: [facility] ORS;  Service: Urology;  Laterality: N/A;  . Cataract extraction w/phaco Right 03/25/2015    Procedure: CATARACT EXTRACTION PHACO AND INTRAOCULAR LENS PLACEMENT RIGHT EYE CDE=12.24;  Surgeon: Tonny Branch, MD;  Location: [facility];  Service: Ophthalmology;  Laterality: Right;  . Amputation Right 05/21/2015    Procedure: AMPUTATION BELOW  KNEE;  Surgeon: Newt Minion, MD;  Location: [facility];  Service: Orthopedics;  Laterality: Right;   Family History  Problem Relation Age of Onset  . Asthma Father 82    DECEASED  . Diabetes     Social  History:  reports that he has never smoked. He has never used smokeless tobacco. He reports that he does not drink alcohol or use illicit drugs. Allergies: No Known Allergies Medications Prior to Admission  Medication Sig Dispense Refill  . AFLURIA PRESERVATIVE FREE 0.5 ML SUSY Inject 0.5 mLs into the muscle once.   0  . allopurinol (ZYLOPRIM) 300 MG tablet Take 300 mg by mouth daily.  5  . atorvastatin (LIPITOR) 10 MG tablet Take 10 mg by mouth every morning.     . Canagliflozin (INVOKANA) 300 MG TABS Take 300 mg by mouth every evening.     . cloNIDine (CATAPRES) 0.2 MG tablet Take 0.2 mg by mouth 2 (two) times daily.      [name] COLCRYS 0.6 MG tablet Take 0.6 mg by mouth daily as needed (gout).     . dabigatran (PRADAXA) 150 MG CAPS capsule Take 1 capsule (150 mg total) by mouth 2 (two) times daily. 60 capsule 6  . digoxin (LANOXIN) 0.25 MG tablet Take 0.125 mg by mouth daily.    [name] gemfibrozil (LOPID) 600 MG tablet Take 600 mg by mouth 2 (two) times daily before a meal.      . insulin detemir (LEVEMIR) 100 UNIT/ML injection Inject 0.5 mLs (50 Units total) into the skin 2 (two) times daily. 10 mL 1  . metoprolol (LOPRESSOR) 50 MG tablet Take 1.5 tablets (75 mg total) by mouth 2 (two) times daily. 180 tablet 3  . NOVOLOG FLEXPEN 100 UNIT/ML FlexPen Inject 25-36 Units into the skin 3 (three) times daily with meals. Takes 25 units in am, 25 units at lunch and 36 units in pm  5  . nystatin ointment (MYCOSTATIN) Apply 1 application topically daily as needed (for fungal infection). For fungal irritation  5  . prazosin (MINIPRESS) 1 MG capsule TAKE ONE CAPSULE BY MOUTH TWICE DAILY 60 capsule 6  . sitaGLIPtin (JANUVIA) 100 MG tablet Take 100 mg by mouth daily.    [name] spironolactone (ALDACTONE) 25 MG tablet Take 0.5 tablets (12.5 mg total) by mouth daily. 45 tablet 3  . torsemide (DEMADEX) 20 MG tablet Take 1 tablet (20 mg total) by mouth as directed. Starting today 03/06/15 TAKE TWO TABLETS (40 MG) TOTAL  IN  THE AM AND ONE TABLET (20 MG) TOTAL IN THE EVENING (Patient taking differently: Take 20-40 mg by mouth 2 (two) times daily. Starting today 03/06/15 TAKE TWO TABLETS (40 MG) TOTAL  IN THE AM AND ONE TABLET (20 MG) TOTAL IN THE EVENING) 90 tablet 6  . valsartan (DIOVAN) 80 MG tablet Take 1 tablet (80 mg total) by mouth daily. 90 tablet 3  . allopurinol (ZYLOPRIM) 100 MG tablet Take 1 tablet (100 mg total) by mouth daily. 30 tablet 1    Home: Home Living Family/patient expects to be discharged to:: Private residence Living Arrangements: Alone Available Help at Discharge: Family, Available PRN/intermittently Type of Home: House Home Access: Stairs to enter Technical brewer of Steps: 2 Entrance Stairs-Rails: None Home Layout: One level Home Equipment: None  Functional History: Prior Function Level of Independence: Independent Functional Status:  Mobility: Bed Mobility Overal bed mobility: Needs Assistance Bed Mobility: Supine to Sit Supine to sit: HOB elevated, Mod assist General bed mobility  comments: mod handheld assist to pull to sit once LEs cleared EOB; trunk girth somewhat limiting anterior weight shift and ability to pull forward to sit up Transfers Overall transfer level: Needs assistance Equipment used: Rolling walker (2 wheeled) Transfers: Sit to/from Stand, [employer] Transfers Sit to Stand: Mod assist Stand pivot transfers: Mod assist General transfer comment: Cues for technqiue, safety and hand placement; mod assist to rise; demo cues to "heel-toe pivot" on L eft foot; minimal hopping to chair with rW      ADL:    Cognition: Cognition Overall Cognitive Status: Within Functional Limits for tasks assessed Orientation Level: Oriented X4 Cognition Arousal/Alertness: Awake/alert Behavior During Therapy: WFL for tasks assessed/performed Overall Cognitive Status: Within Functional Limits for tasks assessed  Blood pressure 122/71, pulse 64, temperature 97.8 F  (36.6 C), temperature source Oral, resp. rate 20, height 5\' 11"  (1.803 m), weight 113.626 kg (250 lb 8 oz), SpO2 97 %. Physical Exam  Vitals reviewed. Constitutional: He is oriented to person, place, and time. He appears well-developed and well-nourished.  HENT:  Head: Normocephalic and atraumatic.  Eyes: Conjunctivae and EOM are normal.  Neck: Normal range of motion. Neck supple. No thyromegaly present.  Cardiovascular: Normal rate.   Cardiac rate controlled  Respiratory: Effort normal and breath sounds normal. No respiratory distress.  GI: Soft. Bowel sounds are normal. He exhibits no distension.  Musculoskeletal:  B/l UE 4-/5 grossly LLU4+/5 grossly RLE hip flex 2/5, BKA  Neurological: He is alert and oriented to person, place, and time.  Sensation intact to light touch throughout  Skin: Skin is warm and dry.  Right BKA site is dressed appropriately tender  Psychiatric: He has a normal mood and affect. His behavior is normal.    Results for orders placed or performed during the hospital encounter of 05/20/15 (from the past 24 hour(s))  Glucose, capillary     Status: None   Collection Time: 05/21/15  5:31 PM  Result Value Ref Range   Glucose-Capillary 71 65 - 99 mg/dL  Glucose, capillary     Status: Abnormal   Collection Time: 05/21/15  5:54 PM  Result Value Ref Range   Glucose-Capillary 103 (H) 65 - 99 mg/dL  CBC with Differential/Platelet     Status: Abnormal   Collection Time: 05/21/15  6:48 PM  Result Value Ref Range   WBC 7.2 4.0 - 10.5 K/uL   RBC 3.44 (L) 4.22 - 5.81 MIL/uL   Hemoglobin 9.1 (L) 13.0 - 17.0 g/dL   HCT 28.3 (L) 39.0 - 52.0 %   MCV 82.3 78.0 - 100.0 fL   MCH 26.5 26.0 - 34.0 pg   MCHC 32.2 30.0 - 36.0 g/dL   RDW 15.6 (H) 11.5 - 15.5 %   Platelets 346 150 - 400 K/uL   Neutrophils Relative % 68 %   Neutro Abs 4.9 1.7 - 7.7 K/uL   Lymphocytes Relative 21 %   Lymphs Abs 1.5 0.7 - 4.0 K/uL   Monocytes Relative 8 %   Monocytes Absolute 0.6 0.1 - 1.0  K/uL   Eosinophils Relative 2 %   Eosinophils Absolute 0.1 0.0 - 0.7 K/uL   Basophils Relative 1 %   Basophils Absolute 0.1 0.0 - 0.1 K/uL  Comprehensive metabolic panel     Status: Abnormal   Collection Time: 05/21/15  6:48 PM  Result Value Ref Range   Sodium 139 135 - 145 mmol/L   Potassium 3.1 (L) 3.5 - 5.1 mmol/L   Chloride 105 101 -  111 mmol/L   CO2 24 22 - 32 mmol/L   Glucose, Bld 82 65 - 99 mg/dL   BUN 31 (H) 6 - 20 mg/dL   Creatinine, Ser 1.33 (H) 0.61 - 1.24 mg/dL   Calcium 8.3 (L) 8.9 - 10.3 mg/dL   Total Protein 5.8 (L) 6.5 - 8.1 g/dL   Albumin 1.6 (L) 3.5 - 5.0 g/dL   AST 19 15 - 41 U/L   ALT 15 (L) 17 - 63 U/L   Alkaline Phosphatase 159 (H) 38 - 126 U/L   Total Bilirubin 0.6 0.3 - 1.2 mg/dL   GFR calc non Af Amer 59 (L) >60 mL/min   GFR calc Af Amer >60 >60 mL/min   Anion gap 10 5 - 15  Prepare RBC (crossmatch)     Status: None   Collection Time: 05/21/15  7:35 PM  Result Value Ref Range   Order Confirmation ORDER PROCESSED BY BLOOD BANK   Glucose, capillary     Status: None   Collection Time: 05/21/15  8:43 PM  Result Value Ref Range   Glucose-Capillary 82 65 - 99 mg/dL  Glucose, capillary     Status: None   Collection Time: 05/21/15  9:29 PM  Result Value Ref Range   Glucose-Capillary 73 65 - 99 mg/dL  CBC     Status: Abnormal   Collection Time: 05/22/15  4:35 AM  Result Value Ref Range   WBC 7.9 4.0 - 10.5 K/uL   RBC 3.35 (L) 4.22 - 5.81 MIL/uL   Hemoglobin 9.0 (L) 13.0 - 17.0 g/dL   HCT 28.0 (L) 39.0 - 52.0 %   MCV 83.6 78.0 - 100.0 fL   MCH 26.9 26.0 - 34.0 pg   MCHC 32.1 30.0 - 36.0 g/dL   RDW 15.6 (H) 11.5 - 15.5 %   Platelets 310 150 - 400 K/uL  Basic metabolic panel     Status: Abnormal   Collection Time: 05/22/15  4:35 AM  Result Value Ref Range   Sodium 139 135 - 145 mmol/L   Potassium 4.3 3.5 - 5.1 mmol/L   Chloride 104 101 - 111 mmol/L   CO2 28 22 - 32 mmol/L   Glucose, Bld 230 (H) 65 - 99 mg/dL   BUN 29 (H) 6 - 20 mg/dL    Creatinine, Ser 1.49 (H) 0.61 - 1.24 mg/dL   Calcium 8.2 (L) 8.9 - 10.3 mg/dL   GFR calc non Af Amer 51 (L) >60 mL/min   GFR calc Af Amer 59 (L) >60 mL/min   Anion gap 7 5 - 15  Glucose, capillary     Status: Abnormal   Collection Time: 05/22/15  7:43 AM  Result Value Ref Range   Glucose-Capillary 226 (H) 65 - 99 mg/dL  Glucose, capillary     Status: Abnormal   Collection Time: 05/22/15 11:20 AM  Result Value Ref Range   Glucose-Capillary 190 (H) 65 - 99 mg/dL   Dg Foot Complete Right  05/20/2015   CLINICAL DATA:  Open heel sore for 2 weeks. Evaluate for osteomyelitis. Initial encounter.  EXAM: RIGHT FOOT COMPLETE - 3+ VIEW  COMPARISON:  05/20/2015 and 11/12/2014.  FINDINGS: Diffuse soft tissue swelling and soft tissue emphysema are again noted, greatest within the hindfoot. There is fragmentation and collapse of the talar dome. Irregularity of the tibial plafond and and both malleoli was better demonstrated on the ankle radiographs done earlier today. No acute osseous findings are seen within the midfoot or forefoot.  IMPRESSION:  No significant change from radiographs done earlier today. Some of the osseous findings may be secondary to Charcot arthropathy, although osteomyelitis remains a concern, especially given the associated soft tissue emphysema.   Electronically Signed   By: Richardean Sale M.D.   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 Does the need for close, 24 hr/day medical supervision in concert with the patient's rehab needs make it unreasonable for this patient to be served in a less intensive setting? Yes 2. Co-Morbidities requiring supervision/potential complications: Morbid Obesity, HTN, A flutter, CHF 3. Due to safety, skin/wound care, disease management, medication administration, pain management and patient education, does the patient require 24 hr/day rehab nursing? Yes 4. Does the patient require  coordinated care of a physician, rehab nurse, PT (1.5-2 hrs/day, 5 days/week) and OT (1.5-2 hrs/day, 5 days/week) to address physical and functional deficits in the context of the above medical diagnosis(es)? Yes Addressing deficits in the following areas: balance, endurance, locomotion, strength, transferring, bathing, dressing, toileting and psychosocial support 5. Can the patient actively participate in an intensive therapy program of at least 3 hrs of therapy per day at least 5 days per week? Yes 6. The potential for patient to make measurable gains while on inpatient rehab is excellent 7. Anticipated functional outcomes upon discharge from inpatient rehab are modified independent and supervision  with PT, modified independent and supervision with OT, n/a with SLP. 8. Estimated rehab length of stay to reach the above functional goals is: 14-18 days. 9. Does the patient have adequate social supports and living environment to accommodate these discharge functional goals? Potentially 10. Anticipated D/C setting: Home 11. Anticipated post D/C treatments: HH therapy and Home excercise program 12. Overall Rehab/Functional Prognosis: excellent  RECOMMENDATIONS: This patient's condition is appropriate for continued rehabilitative care in the following setting: CIR Patient has agreed to participate in recommended program. Yes Note that insurance prior authorization may be required for reimbursement for recommended care.  Comment: Rehab Admissions Coordinator to follow up.  Delice Lesch, MD 05/22/2015

## 2015-05-22 NOTE — Care Management Note (Signed)
Case Management Note  Patient Details  Name: DEQUANDRE CORDOVA MRN: [id number] Date of Birth: Nov 24, 1960  Subjective/Objective:       CM following for progression and d/c planning.             Action/Plan: 05/22/2015 Noted PT recommendations, and CIR consult, CM will follow for recommendations and assist as needed. If pt qualifies for CIR all DME and any HH needs will be arranged at the time of d/c from that facility.   Expected Discharge Date:                  Expected Discharge Plan:  Index  In-House Referral:  NA  Discharge planning Services  CM Consult  Post Acute Care Choice:    Choice offered to:     DME Arranged:    DME Agency:     HH Arranged:    HH Agency:     Status of Service:  In process, will continue to follow  Medicare Important Message Given:    Date Medicare IM Given:    Medicare IM give by:    Date Additional Medicare IM Given:    Additional Medicare Important Message give by:     If discussed at [place] of Stay Meetings, dates discussed:    Additional Comments:  Adron Bene, RN 05/22/2015, 4:45 PM

## 2015-05-23 ENCOUNTER — Inpatient Hospital Stay (HOSPITAL_COMMUNITY)
Admission: AD | Admit: 2015-05-23 | Discharge: 2015-06-07 | DRG: 300 | Disposition: A | Discharge status: Home Health | Payer: Medicare Other | Source: Intra-hospital | Attending: Physical Medicine & Rehabilitation | Admitting: Physical Medicine & Rehabilitation

## 2015-05-23 DIAGNOSIS — D62 Acute posthemorrhagic anemia: Secondary | ICD-10-CM | POA: Diagnosis present

## 2015-05-23 DIAGNOSIS — M25512 Pain in left shoulder: Secondary | ICD-10-CM | POA: Diagnosis present

## 2015-05-23 DIAGNOSIS — I5022 Chronic systolic (congestive) heart failure: Secondary | ICD-10-CM | POA: Diagnosis present

## 2015-05-23 DIAGNOSIS — E785 Hyperlipidemia, unspecified: Secondary | ICD-10-CM | POA: Diagnosis present

## 2015-05-23 DIAGNOSIS — I429 Cardiomyopathy, unspecified: Secondary | ICD-10-CM | POA: Diagnosis present

## 2015-05-23 DIAGNOSIS — I130 Hypertensive heart and chronic kidney disease with heart failure and stage 1 through stage 4 chronic kidney disease, or unspecified chronic kidney disease: Secondary | ICD-10-CM | POA: Diagnosis present

## 2015-05-23 DIAGNOSIS — E1165 Type 2 diabetes mellitus with hyperglycemia: Secondary | ICD-10-CM | POA: Diagnosis not present

## 2015-05-23 DIAGNOSIS — E1110 Type 2 diabetes mellitus with ketoacidosis without coma: Secondary | ICD-10-CM | POA: Diagnosis present

## 2015-05-23 DIAGNOSIS — I739 Peripheral vascular disease, unspecified: Secondary | ICD-10-CM | POA: Diagnosis present

## 2015-05-23 DIAGNOSIS — Z8679 Personal history of other diseases of the circulatory system: Secondary | ICD-10-CM | POA: Diagnosis not present

## 2015-05-23 DIAGNOSIS — Z89511 Acquired absence of right leg below knee: Secondary | ICD-10-CM | POA: Diagnosis not present

## 2015-05-23 DIAGNOSIS — B964 Proteus (mirabilis) (morganii) as the cause of diseases classified elsewhere: Secondary | ICD-10-CM | POA: Diagnosis present

## 2015-05-23 DIAGNOSIS — E1142 Type 2 diabetes mellitus with diabetic polyneuropathy: Secondary | ICD-10-CM | POA: Diagnosis present

## 2015-05-23 DIAGNOSIS — M7542 Impingement syndrome of left shoulder: Secondary | ICD-10-CM | POA: Diagnosis not present

## 2015-05-23 DIAGNOSIS — Z794 Long term (current) use of insulin: Secondary | ICD-10-CM

## 2015-05-23 DIAGNOSIS — E1122 Type 2 diabetes mellitus with diabetic chronic kidney disease: Secondary | ICD-10-CM | POA: Diagnosis present

## 2015-05-23 DIAGNOSIS — M109 Gout, unspecified: Secondary | ICD-10-CM | POA: Diagnosis present

## 2015-05-23 DIAGNOSIS — S88111A Complete traumatic amputation at level between knee and ankle, right lower leg, initial encounter: Secondary | ICD-10-CM

## 2015-05-23 DIAGNOSIS — I482 Chronic atrial fibrillation: Secondary | ICD-10-CM | POA: Diagnosis not present

## 2015-05-23 DIAGNOSIS — E1310 Other specified diabetes mellitus with ketoacidosis without coma: Secondary | ICD-10-CM

## 2015-05-23 DIAGNOSIS — M7552 Bursitis of left shoulder: Secondary | ICD-10-CM | POA: Diagnosis not present

## 2015-05-23 LAB — CBC
HEMATOCRIT: 27.4 % — AB (ref 39.0–52.0)
HEMOGLOBIN: 8.6 g/dL — AB (ref 13.0–17.0)
MCH: 26.5 pg (ref 26.0–34.0)
MCHC: 31.4 g/dL (ref 30.0–36.0)
MCV: 84.3 fL (ref 78.0–100.0)
PLATELETS: 374 10*3/uL (ref 150–400)
RBC: 3.25 MIL/uL — AB (ref 4.22–5.81)
RDW: 15.5 % (ref 11.5–15.5)
WBC: 9.7 10*3/uL (ref 4.0–10.5)

## 2015-05-23 LAB — BASIC METABOLIC PANEL
ANION GAP: 8 (ref 5–15)
BUN: 22 mg/dL — AB (ref 6–20)
CO2: 27 mmol/L (ref 22–32)
Calcium: 7.8 mg/dL — ABNORMAL LOW (ref 8.9–10.3)
Chloride: 103 mmol/L (ref 101–111)
Creatinine, Ser: 1.04 mg/dL (ref 0.61–1.24)
Glucose, Bld: 94 mg/dL (ref 65–99)
POTASSIUM: 3.1 mmol/L — AB (ref 3.5–5.1)
SODIUM: 138 mmol/L (ref 135–145)

## 2015-05-23 LAB — GLUCOSE, CAPILLARY
GLUCOSE-CAPILLARY: 87 mg/dL (ref 65–99)
GLUCOSE-CAPILLARY: 72 mg/dL (ref 65–99)
Glucose-Capillary: 105 mg/dL — ABNORMAL HIGH (ref 65–99)
Glucose-Capillary: 197 mg/dL — ABNORMAL HIGH (ref 65–99)

## 2015-05-23 MED ORDER — ALLOPURINOL 100 MG PO TABS
300.0000 mg | ORAL_TABLET | Freq: Every day | ORAL | Status: DC
  Administered 2015-05-24 – 2015-06-07 (×15): 300 mg via ORAL
  Filled 2015-05-23 (×16): qty 3

## 2015-05-23 MED ORDER — ONDANSETRON HCL 4 MG/2ML IJ SOLN: 4.0000 mg | Freq: Four times a day (QID) | INTRAMUSCULAR | Status: DC | PRN

## 2015-05-23 MED ORDER — ACETAMINOPHEN 650 MG RE SUPP: 650.0000 mg | Freq: Four times a day (QID) | RECTAL | Status: DC | PRN

## 2015-05-23 MED ORDER — IRBESARTAN 75 MG PO TABS
75.0000 mg | ORAL_TABLET | Freq: Every day | ORAL | Status: DC
  Administered 2015-05-23: 75 mg via ORAL
  Filled 2015-05-23: qty 1

## 2015-05-23 MED ORDER — PRAZOSIN HCL 1 MG PO CAPS
1.0000 mg | ORAL_CAPSULE | Freq: Two times a day (BID) | ORAL | Status: DC
  Administered 2015-05-23 – 2015-06-07 (×30): 1 mg via ORAL
  Filled 2015-05-23 (×33): qty 1

## 2015-05-23 MED ORDER — POTASSIUM CHLORIDE CRYS ER 20 MEQ PO TBCR
40.0000 meq | EXTENDED_RELEASE_TABLET | Freq: Once | ORAL | Status: AC
  Administered 2015-05-23: 40 meq via ORAL
  Filled 2015-05-23: qty 2

## 2015-05-23 MED ORDER — CIPROFLOXACIN HCL 500 MG PO TABS
500.0000 mg | ORAL_TABLET | Freq: Two times a day (BID) | ORAL | Status: DC
  Administered 2015-05-23: 500 mg via ORAL
  Filled 2015-05-23: qty 1

## 2015-05-23 MED ORDER — ONDANSETRON HCL 4 MG PO TABS: 4.0000 mg | ORAL_TABLET | Freq: Four times a day (QID) | ORAL | Status: DC | PRN

## 2015-05-23 MED ORDER — INSULIN DETEMIR 100 UNIT/ML ~~LOC~~ SOLN
60.0000 [IU] | Freq: Every day | SUBCUTANEOUS | Status: DC
  Administered 2015-05-23 – 2015-05-25 (×3): 60 [IU] via SUBCUTANEOUS
  Filled 2015-05-23 (×4): qty 0.6

## 2015-05-23 MED ORDER — METHOCARBAMOL 500 MG PO TABS
500.0000 mg | ORAL_TABLET | Freq: Four times a day (QID) | ORAL | Status: DC | PRN
  Administered 2015-06-01 – 2015-06-04 (×5): 500 mg via ORAL
  Filled 2015-05-23 (×5): qty 1

## 2015-05-23 MED ORDER — INSULIN ASPART 100 UNIT/ML ~~LOC~~ SOLN
0.0000 [IU] | Freq: Three times a day (TID) | SUBCUTANEOUS | Status: DC
  Administered 2015-05-26: 2 [IU] via SUBCUTANEOUS
  Administered 2015-05-27 – 2015-05-30 (×2): 3 [IU] via SUBCUTANEOUS
  Administered 2015-05-30 – 2015-06-01 (×3): 2 [IU] via SUBCUTANEOUS
  Administered 2015-06-02: 3 [IU] via SUBCUTANEOUS

## 2015-05-23 MED ORDER — DABIGATRAN ETEXILATE MESYLATE 150 MG PO CAPS
150.0000 mg | ORAL_CAPSULE | Freq: Two times a day (BID) | ORAL | Status: DC
  Administered 2015-05-23 – 2015-06-07 (×30): 150 mg via ORAL
  Filled 2015-05-23 (×33): qty 1

## 2015-05-23 MED ORDER — CLONIDINE HCL 0.2 MG PO TABS
0.2000 mg | ORAL_TABLET | Freq: Two times a day (BID) | ORAL | Status: DC
  Administered 2015-05-23 – 2015-06-07 (×29): 0.2 mg via ORAL
  Filled 2015-05-23 (×31): qty 1

## 2015-05-23 MED ORDER — INSULIN ASPART 100 UNIT/ML ~~LOC~~ SOLN
3.0000 [IU] | Freq: Three times a day (TID) | SUBCUTANEOUS | Status: DC
  Administered 2015-05-24 – 2015-05-26 (×5): 3 [IU] via SUBCUTANEOUS

## 2015-05-23 MED ORDER — DIGOXIN 125 MCG PO TABS
0.1250 mg | ORAL_TABLET | Freq: Every day | ORAL | Status: DC
  Administered 2015-05-24 – 2015-06-07 (×15): 0.125 mg via ORAL
  Filled 2015-05-23 (×15): qty 1

## 2015-05-23 MED ORDER — DOXYCYCLINE HYCLATE 100 MG PO TABS
100.0000 mg | ORAL_TABLET | Freq: Two times a day (BID) | ORAL | Status: DC
  Administered 2015-05-23: 100 mg via ORAL
  Filled 2015-05-23: qty 1

## 2015-05-23 MED ORDER — ADULT MULTIVITAMIN W/MINERALS CH
1.0000 | ORAL_TABLET | Freq: Every day | ORAL | Status: DC
  Administered 2015-05-24 – 2015-06-07 (×15): 1 via ORAL
  Filled 2015-05-23 (×15): qty 1

## 2015-05-23 MED ORDER — CIPROFLOXACIN HCL 500 MG PO TABS
500.0000 mg | ORAL_TABLET | Freq: Two times a day (BID) | ORAL | Status: DC
  Administered 2015-05-24 – 2015-06-07 (×28): 500 mg via ORAL
  Filled 2015-05-23 (×28): qty 1

## 2015-05-23 MED ORDER — HYDROCODONE-ACETAMINOPHEN 5-325 MG PO TABS
1.0000 | ORAL_TABLET | ORAL | Status: DC | PRN
  Administered 2015-05-24 – 2015-05-31 (×20): 2 via ORAL
  Filled 2015-05-23 (×20): qty 2

## 2015-05-23 MED ORDER — GEMFIBROZIL 600 MG PO TABS
600.0000 mg | ORAL_TABLET | Freq: Two times a day (BID) | ORAL | Status: DC
  Administered 2015-05-24 – 2015-05-25 (×3): 600 mg via ORAL
  Filled 2015-05-23 (×5): qty 1

## 2015-05-23 MED ORDER — METOPROLOL TARTRATE 25 MG PO TABS
75.0000 mg | ORAL_TABLET | Freq: Two times a day (BID) | ORAL | Status: DC
  Administered 2015-05-23 – 2015-06-07 (×30): 75 mg via ORAL
  Filled 2015-05-23 (×44): qty 1
  Filled 2015-05-23: qty 2
  Filled 2015-05-23 (×17): qty 1

## 2015-05-23 MED ORDER — ATORVASTATIN CALCIUM 10 MG PO TABS
10.0000 mg | ORAL_TABLET | Freq: Every morning | ORAL | Status: DC
  Administered 2015-05-24 – 2015-06-06 (×14): 10 mg via ORAL
  Filled 2015-05-23 (×14): qty 1

## 2015-05-23 MED ORDER — DOXYCYCLINE HYCLATE 100 MG PO TABS
100.0000 mg | ORAL_TABLET | Freq: Two times a day (BID) | ORAL | Status: DC
  Administered 2015-05-24 – 2015-06-07 (×28): 100 mg via ORAL
  Filled 2015-05-23 (×28): qty 1

## 2015-05-23 MED ORDER — OXYCODONE HCL 5 MG PO TABS
5.0000 mg | ORAL_TABLET | ORAL | Status: DC | PRN
  Administered 2015-05-27 – 2015-06-01 (×2): 10 mg via ORAL
  Filled 2015-05-23 (×3): qty 2

## 2015-05-23 MED ORDER — TORSEMIDE 20 MG PO TABS
20.0000 mg | ORAL_TABLET | Freq: Two times a day (BID) | ORAL | Status: DC
Start: 2015-05-23 — End: 2015-06-07
  Administered 2015-05-23 – 2015-06-07 (×30): 20 mg via ORAL
  Filled 2015-05-23 (×30): qty 1

## 2015-05-23 MED ORDER — SORBITOL 70 % SOLN: 30.0000 mL | Freq: Every day | Status: DC | PRN

## 2015-05-23 MED ORDER — INSULIN ASPART 100 UNIT/ML ~~LOC~~ SOLN
3.0000 [IU] | Freq: Three times a day (TID) | SUBCUTANEOUS | Status: DC
  Administered 2015-05-23: 3 [IU] via SUBCUTANEOUS

## 2015-05-23 MED ORDER — ACETAMINOPHEN 325 MG PO TABS: 650.0000 mg | ORAL_TABLET | Freq: Four times a day (QID) | ORAL | Status: DC | PRN

## 2015-05-23 NOTE — Progress Notes (Signed)
[facility]                                                                              Patient Demographics  [name], is a 54 y.o. male, DOB - 10/15/1960, DXI:[id number]  Admit date - 05/20/2015   Admitting Physician Verlee Monte, MD  Outpatient Primary MD for the patient is [facility], MD  LOS - 3   Chief complaint:  worsening chronic right foot ulcer      Brief HPI    Patient is a 54 year old male with diabetes mellitus on insulin, and chronic CHF systolic, EF 76% per echo in 9/15, atrial fibrillation on pradaxa , hypertension, hyperlipidemia, chronic right foot ulcer, has been following Dr. Sharol Given outpatient presented to [facility] ED with worsening right foot ulcer. History was obtained from the patient who reported that he has the right foot ulcer for the last 2 months. He had been wearing orthopedics brace to walk which was rubbing too close to the ankle and made the ulcer worse. He was seen by Dr. Sharol Given in the office a month ago and per patient was placed on doxycycline for 3 weeks. Patient noticed that in the last few days the ulcer was becoming more foul-smelling, draining, increasing in size and patient was having chills. He went to the ER today and patient was sent to [facility] for further management.  Assessment & Plan     Principal Problem:  Right foot ulcer ([place]) with osteomyelitis with underlying uncontrolled IDDM; postop day # 2, status post BKA - xray report showed soft tissue emphysema, possible osteomyelitis especially given the associated soft tissue emphysema - Continue IV vancomycin and Zosyn, blood cultures and wound cultures at [facility] positive for Proteus mirabilis, sensitive to Zosyn, Rocephin and ciprofloxacin. The patient has been on vancomycin and Zosyn since her hospitalization. Repeat blood cultures at our facility negative so far. -  Status post right BKA on 10/4 - When ready for discharge, will transition to oral  antibiotics, doxy/ciprofloxacin for 2 weeks. - SNF vs CIR  Active Problems:  Proteus mirabilis bacteremia, proteus in wound cx  - Blood cultures from [facility] reviewed, patient's blood cx was positive for Proteus mirabilis (also in wound culture). Sensitivities reviewed - Continue IV Zosyn for now, once close to DC, change to Cipro for 2 weeks   Insulin-dependent diabetes mellitus - Hemoglobin A1c 9.0 - DC IV fluids, continue Levemir 60units, meal coverage and  sliding scale insulin    Mild acute on chronic renal insufficiency: Patient presented with creatinine of 1.58, baseline 1.0, likely due to #1 - Creatinine resolved, continue torsemide, ACE inhibitor and spironolactone were held.   OBESITY-MORBID (>100') - Patient strongly counseled on diet and weight control   Essential hypertension - Currently stable, continue metoprolol, clonidine, prazosin   Atrial flutter  - Currently rate controlled continue metoprolol, digoxin - Pradaxa was held for surgery, restarted   Chronic systolic dysfunction of left ventricle - Currently compensated, EF 35% per 2-D echo in 9/15 - Creatinine function 1.0, continue to hold spironolactone and ACEI, continue torsemide, beta blocker.    HLD (hyperlipidemia) - Continue statin  DVT prophylaxis:  Pradaxa  Code Status: Full CODE STATUS  Family Communication: Discussed in detail with the patient, all imaging results, lab results explained to the patient    Disposition Plan: Not medically ready, awaiting SNF vs CIR patient lives alone  Time Spent in minutes  25 minutes  Procedures  X-ray of the foot BKA  Consults   Orthopedics   Medications  Scheduled Meds: . allopurinol  300 mg Oral Daily  . atorvastatin  10 mg Oral q morning - 10a  . cloNIDine  0.2 mg Oral BID  . dabigatran  150 mg Oral Q12H  . digoxin  0.125 mg Oral Daily  . feeding supplement (GLUCERNA SHAKE)  237 mL Oral Q1500  . feeding supplement (PRO-STAT SUGAR FREE  64)  30 mL Oral BID  . gemfibrozil  600 mg Oral BID AC  . insulin aspart  0-15 Units Subcutaneous TID WC  . insulin aspart  0-5 Units Subcutaneous QHS  . insulin aspart  6 Units Subcutaneous TID WC  . insulin detemir  60 Units Subcutaneous QHS  . metoprolol  75 mg Oral BID  . multivitamin with minerals  1 tablet Oral Daily  . piperacillin-tazobactam (ZOSYN)  IV  3.375 g Intravenous 3 times per day  . prazosin  1 mg Oral BID  . sodium chloride  3 mL Intravenous Q12H  . torsemide  20 mg Oral BID  . vancomycin  750 mg Intravenous Q12H   Continuous Infusions: . sodium chloride     PRN Meds:.acetaminophen **OR** acetaminophen, HYDROcodone-acetaminophen, HYDROmorphone (DILAUDID) injection, meperidine (DEMEROL) injection, methocarbamol **OR** methocarbamol (ROBAXIN)  IV, metoCLOPramide **OR** metoCLOPramide (REGLAN) injection, ondansetron **OR** ondansetron (ZOFRAN) IV, oxyCODONE, promethazine   Antibiotics   Anti-infectives    Start     Dose/Rate Route Frequency Ordered Stop   05/20/15 2300  vancomycin (VANCOCIN) 500 mg in sodium chloride 0.9 % 100 mL IVPB  Status:  Discontinued     500 mg 100 mL/hr over 60 Minutes Intravenous Every 12 hours 05/20/15 2104 05/20/15 2150   05/20/15 2300  vancomycin (VANCOCIN) IVPB 750 mg/150 ml premix     750 mg 150 mL/hr over 60 Minutes Intravenous Every 12 hours 05/20/15 2150     05/20/15 2100  vancomycin (VANCOCIN) 500 mg in sodium chloride 0.9 % 100 mL IVPB  Status:  Discontinued     500 mg 100 mL/hr over 60 Minutes Intravenous Every 12 hours 05/20/15 2028 05/20/15 2104   05/20/15 2000  vancomycin (VANCOCIN) IVPB 750 mg/150 ml premix  Status:  Discontinued     750 mg 150 mL/hr over 60 Minutes Intravenous Every 8 hours 05/20/15 1756 05/20/15 2028   05/20/15 1830  piperacillin-tazobactam (ZOSYN) IVPB 3.375 g     3.375 g 12.5 mL/hr over 240 Minutes Intravenous 3 times per day 05/20/15 1756          Subjective:   [name] was seen and  examined today. Postop day #2, doing well. Pain controlled. Afebrile, no acute issues overnight. Patient denies dizziness, chest pain, shortness of breath, abdominal pain, N/V/D/C, new weakness, numbess, tingling.    Objective:   Blood pressure 130/71, pulse 70, temperature 98.1 F (36.7 C), temperature source Oral, resp. rate 17, height 5\' 11"  (1.803 m), weight 113.626 kg (250 lb 8 oz), SpO2 98 %.  Wt Readings from Last 3 Encounters:  05/21/15 113.626 kg (250 lb 8 oz)  04/16/15 122.018 kg (269 lb)  03/13/15 123.288 kg (271 lb 12.8 oz)     Intake/Output Summary (Last 24  hours) at 05/23/15 1010 Last data filed at 05/23/15 0831  Gross per 24 hour  Intake 3312.5 ml  Output   1350 ml  Net 1962.5 ml    Exam  General: Alert and oriented x 3, NAD  HEENT:  PERRLA, EOMI,   Neck: Supple, no JVD, no masses  CVS: S1 S2 clear, RRR  Respiratory: CTAB  Abdomen: Soft, NT, NBS  Ext: no cyanosis clubbing. Right BKA  Neuro: no new deficits  Skin: No rashes  Psych: Normal affect and demeanor, alert and oriented x3    Data Review   Micro Results Recent Results (from the past 240 hour(s))  Culture, blood (routine x 2)     Status: None (Preliminary result)   Collection Time: 05/20/15  6:33 PM  Result Value Ref Range Status   Specimen Description BLOOD RIGHT ARM  Final   Special Requests BOTTLES DRAWN AEROBIC AND ANAEROBIC 8CC  Final   Culture NO GROWTH 2 DAYS  Final   Report Status PENDING  Incomplete  Culture, blood (routine x 2)     Status: None (Preliminary result)   Collection Time: 05/20/15  6:40 PM  Result Value Ref Range Status   Specimen Description BLOOD RIGHT HAND  Final   Special Requests BOTTLES DRAWN AEROBIC ONLY 10CC  Final   Culture NO GROWTH 2 DAYS  Final   Report Status PENDING  Incomplete  Surgical pcr screen     Status: None   Collection Time: 05/20/15 11:54 PM  Result Value Ref Range Status   MRSA, PCR NEGATIVE NEGATIVE Final   Staphylococcus aureus  NEGATIVE NEGATIVE Final    Comment:        The Xpert SA Assay (FDA approved for NASAL specimens in patients over 74 years of age), is one component of a comprehensive surveillance program.  Test performance has been validated by [facility] for patients greater than or equal to 14 year old. It is not intended to diagnose infection nor to guide or monitor treatment.     Radiology Reports Dg Foot Complete Right  05/20/2015   CLINICAL DATA:  Open heel sore for 2 weeks. Evaluate for osteomyelitis. Initial encounter.  EXAM: RIGHT FOOT COMPLETE - 3+ VIEW  COMPARISON:  05/20/2015 and 11/12/2014.  FINDINGS: Diffuse soft tissue swelling and soft tissue emphysema are again noted, greatest within the hindfoot. There is fragmentation and collapse of the talar dome. Irregularity of the tibial plafond and and both malleoli was better demonstrated on the ankle radiographs done earlier today. No acute osseous findings are seen within the midfoot or forefoot.  IMPRESSION: No significant change from radiographs done earlier today. Some of the osseous findings may be secondary to Charcot arthropathy, although osteomyelitis remains a concern, especially given the associated soft tissue emphysema.   Electronically Signed   By: Richardean Sale M.D.   On: 05/20/2015 19:41    CBC  Recent Labs Lab 05/20/15 1833 05/21/15 0721 05/21/15 1848 05/22/15 0435 05/23/15 0623  WBC 6.7 6.3 7.2 7.9 9.7  HGB 9.3* 9.1* 9.1* 9.0* 8.6*  HCT 29.2* 28.5* 28.3* 28.0* 27.4*  PLT 297 294 346 310 374  MCV 81.3 81.7 82.3 83.6 84.3  MCH 25.9* 26.1 26.5 26.9 26.5  MCHC 31.8 31.9 32.2 32.1 31.4  RDW 15.3 15.5 15.6* 15.6* 15.5  LYMPHSABS  --   --  1.5  --   --   MONOABS  --   --  0.6  --   --   EOSABS  --   --  0.1  --   --   BASOSABS  --   --  0.1  --   --     Chemistries   Recent Labs Lab 05/20/15 1833 05/21/15 0721 05/21/15 1848 05/22/15 0435 05/23/15 0623  NA 136 137 139 139 138  K 3.6 3.2* 3.1* 4.3 3.1*  CL  101 103 105 104 103  CO2 25 24 24 28 27   GLUCOSE 213* 111* 82 230* 94  BUN 34* 39* 31* 29* 22*  CREATININE 1.58* 1.55* 1.33* 1.49* 1.04  CALCIUM 8.3* 8.2* 8.3* 8.2* 7.8*  MG  --  1.8  --   --   --   AST 20  --  19  --   --   ALT 16*  --  15*  --   --   ALKPHOS 174*  --  159*  --   --   BILITOT 1.0  --  0.6  --   --    ------------------------------------------------------------------------------------------------------------------ estimated creatinine clearance is 102.8 mL/min (by C-G formula based on Cr of 1.04). ------------------------------------------------------------------------------------------------------------------  Recent Labs  05/20/15 1833  HGBA1C 9.0*   ------------------------------------------------------------------------------------------------------------------ No results for input(s): CHOL, HDL, LDLCALC, TRIG, CHOLHDL, LDLDIRECT in the last 72 hours. ------------------------------------------------------------------------------------------------------------------ No results for input(s): TSH, T4TOTAL, T3FREE, THYROIDAB in the last 72 hours.  Invalid input(s): FREET3 ------------------------------------------------------------------------------------------------------------------ No results for input(s): VITAMINB12, FOLATE, FERRITIN, TIBC, IRON, RETICCTPCT in the last 72 hours.  Coagulation profile  Recent Labs Lab 05/21/15 1626  INR 2.01*    No results for input(s): DDIMER in the last 72 hours.  Cardiac Enzymes No results for input(s): CKMB, TROPONINI, MYOGLOBIN in the last 168 hours.  Invalid input(s): CK ------------------------------------------------------------------------------------------------------------------ Invalid input(s): [place]  05/21/15 2129 05/22/15 0743 05/22/15 1120 05/22/15 1701 05/22/15 2116 05/23/15 0738  GLUCAP 73 226* 190* 216* 195* 30     RAI,RIPUDEEP M.D. [facility] 05/23/2015, 10:10  AM  Pager: [phone] Between 7am to 7pm - call Pager - [phone]  After 7pm go to [email or web address] - password [email or web address]  Call night coverage person covering after 7pm

## 2015-05-23 NOTE — Care Management Important Message (Signed)
Important Message  Patient Details  Name: [name] MRN: [id number] Date of Birth: 12/23/1960   Medicare Important Message Given:  Yes-second notification given    Delorse Lek 05/23/2015, 1:16 PM

## 2015-05-23 NOTE — Progress Notes (Signed)
Patient ID: [name], male   DOB: December 10, 1960, 54 y.o.   MRN: [id number] Patient comfortable s/p BKA, plan for SNF placement

## 2015-05-23 NOTE — Care Management Note (Signed)
Case Management Note  Patient Details  Name: [name] MRN: [id number] Date of Birth: 01/10/1961  Subjective/Objective:            CM following for progression and d/c planning.        Action/Plan: Pt for d/c to [facility] today.  Expected Discharge Date:      10/76/2016            Expected Discharge Plan:  [place]  In-House Referral:  NA  Discharge planning Services  CM Consult  Post Acute Care Choice:    Choice offered to:     DME Arranged:    DME Agency:     HH Arranged:    HH Agency:     Status of Service:  Completed, signed off  Medicare Important Message Given:  Yes-second notification given Date Medicare IM Given:    Medicare IM give by:    Date Additional Medicare IM Given:    Additional Medicare Important Message give by:     If discussed at [place] of Stay Meetings, dates discussed:    Additional Comments:  Adron Bene, RN 05/23/2015, 3:20 PM

## 2015-05-23 NOTE — H&P (Signed)
Physical Medicine and Rehabilitation Admission H&P   Chief complaint: Stump pain  HPI: [name] is a 54 y.o. right handed male with history of hypertension, atrial flutter with ablation maintained on Pradaxa, nonischemic cardiomyopathy, chronic systolic congestive heart failure, diabetes mellitus and peripheral neuropathy, chronic renal insufficiency with baseline creatinine 1.58. Patient lives alone in [place]. Used a cane and walker prior to admission. One level home 2 steps to entry. Admitted 05/21/2015 with chronic right ankle foot ulcer followed by Dr. Sharol Given. Noted over the past 3 weeks with increasing foul-smelling odor and drainage from wound. X-rays of right foot showed some of the osseous findings secondary to possible Charcot arthropathy although osteomyelitis was a concern. 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 Sharol Given. Hospital course pain management and anemia. Patient remained on vancomycin and Zosyn blood cultures at [facility] positive for Proteus. Repeat blood cultures thus far negative. Plan to transition to doxycycline Cipro orally 05/23/2015 2 more weeks and stopped. Acute blood loss anemia 9.0 and monitored. Physical therapy evaluations with recommendations of physical medicine rehabilitation consult . Patient was admitted for a comprehensive rehabilitation program  Patient states that he lives alone but his mother lives 1-1/2 miles from him, his cousin could potentially help as well  ROS Review of Systems  Constitutional: Negative for chills.  HENT: Negative for hearing loss.  Eyes: Negative for blurred vision and double vision.  Respiratory: Negative for cough.   Shortness of breath with exertion  Gastrointestinal: Negative for nausea and vomiting.  Genitourinary: Negative for dysuria and hematuria.  Musculoskeletal: Positive for myalgias.  Skin: Negative for rash.   Neurological: Negative for seizures, loss of consciousness and headaches.  All other systems reviewed and are negative   Past Medical History  Diagnosis Date  . Hypertension   . Morbid obesity ([place])   . Atrial flutter ([place]) 10/01/2006, 12/30/11    Status post RF ablation, 7/13, Dr. [name]  . OSA (obstructive sleep apnea)     compliant with CPAP  . Nonischemic cardiomyopathy ([place])     Normal coronary arteries, 2008  . Chronic systolic dysfunction of left ventricle     EF 20-25%, global HK; mild RVD, 2-D echo, 5/13  . Unspecified disorder of kidney and ureter   . RBBB, anterior fascicular block and incomplete LBBB   . Pulmonary hypertension ([facility])     RVSP 50-55 mmHg, 5/13  . Valvular heart disease     Mild MR/TR, 2-D echo, 5/13  . Type II or unspecified type diabetes mellitus without mention of complication, not stated as uncontrolled    Past Surgical History  Procedure Laterality Date  . Bi-ventricular implantable cardioverter defibrillator (crt-d)  10/27/2013    MDT [name] XT CRTD implanted by [name] for NICM, CHF  . Atrial flutter ablation N/A 03/04/2012    Procedure: ATRIAL FLUTTER ABLATION; Surgeon: Thompson Grayer, MD; Location: [facility] CATH LAB; Service: Cardiovascular; Laterality: N/A;  . Bi-ventricular implantable cardioverter defibrillator N/A 10/27/2013    Procedure: BI-VENTRICULAR IMPLANTABLE CARDIOVERTER DEFIBRILLATOR (CRT-D); Surgeon: Coralyn Mark, MD; Location: [facility] CATH LAB; Service: Cardiovascular; Laterality: N/A;  . Cystoscopy/retrograde/ureteroscopy Bilateral 10/16/2014    Procedure: Kathrene Alu WITH BILATERAL Freddie Breech WITH GYRUS; Surgeon: Malka So, MD; Location: [facility] ORS; Service: Urology; Laterality: Bilateral;  . Transurethral resection of bladder tumor with gyrus (turbt-gyrus) N/A 10/16/2014    Procedure: TRANSURETHRAL RESECTION OF BLADDER TUMOR WITH  GYRUS (TURBT-GYRUS); Surgeon: Malka So, MD; Location: [facility]  ORS; Service: Urology; Laterality: N/A;  . Cataract extraction w/phaco Right 03/25/2015    Procedure: CATARACT EXTRACTION PHACO AND INTRAOCULAR LENS PLACEMENT RIGHT EYE CDE=12.24; Surgeon: Kerry Hunt, MD; Location: [facility] ORS; Service: Ophthalmology; Laterality: Right;  . Amputation Right 05/21/2015    Procedure: AMPUTATION BELOW KNEE; Surgeon: Marcus Duda V, MD; Location: [facility] OR; Service: Orthopedics; Laterality: Right;   Family History  Problem Relation Age of Onset  . Asthma Father 33    DECEASED  . Diabetes     Social History:  reports that he has never smoked. He has never used smokeless tobacco. He reports that he does not drink alcohol or use illicit drugs. Allergies: No Known Allergies Medications Prior to Admission  Medication Sig Dispense Refill  . AFLURIA PRESERVATIVE FREE 0.5 ML SUSY Inject 0.5 mLs into the muscle once.   0  . allopurinol (ZYLOPRIM) 300 MG tablet Take 300 mg by mouth daily.  5  . atorvastatin (LIPITOR) 10 MG tablet Take 10 mg by mouth every morning.     . Canagliflozin (INVOKANA) 300 MG TABS Take 300 mg by mouth every evening.     . cloNIDine (CATAPRES) 0.2 MG tablet Take 0.2 mg by mouth 2 (two) times daily.     . COLCRYS 0.6 MG tablet Take 0.6 mg by mouth daily as needed (gout).     . dabigatran (PRADAXA) 150 MG CAPS capsule Take 1 capsule (150 mg total) by mouth 2 (two) times daily. 60 capsule 6  . digoxin (LANOXIN) 0.25 MG tablet Take 0.125 mg by mouth daily.    . gemfibrozil (LOPID) 600 MG tablet Take 600 mg by mouth 2 (two) times daily before a meal.     . insulin detemir (LEVEMIR) 100 UNIT/ML injection Inject 0.5 mLs (50 Units total) into the skin 2 (two) times daily. 10 mL 1  . metoprolol (LOPRESSOR) 50 MG tablet Take 1.5 tablets (75 mg total) by mouth 2 (two) times daily. 180 tablet 3  . NOVOLOG  FLEXPEN 100 UNIT/ML FlexPen Inject 25-36 Units into the skin 3 (three) times daily with meals. Takes 25 units in am, 25 units at lunch and 36 units in pm  5  . nystatin ointment (MYCOSTATIN) Apply 1 application topically daily as needed (for fungal infection). For fungal irritation  5  . prazosin (MINIPRESS) 1 MG capsule TAKE ONE CAPSULE BY MOUTH TWICE DAILY 60 capsule 6  . sitaGLIPtin (JANUVIA) 100 MG tablet Take 100 mg by mouth daily.    . spironolactone (ALDACTONE) 25 MG tablet Take 0.5 tablets (12.5 mg total) by mouth daily. 45 tablet 3  . torsemide (DEMADEX) 20 MG tablet Take 1 tablet (20 mg total) by mouth as directed. Starting today 03/06/15 TAKE TWO TABLETS (40 MG) TOTAL IN THE AM AND ONE TABLET (20 MG) TOTAL IN THE EVENING (Patient taking differently: Take 20-40 mg by mouth 2 (two) times daily. Starting today 03/06/15 TAKE TWO TABLETS (40 MG) TOTAL IN THE AM AND ONE TABLET (20 MG) TOTAL IN THE EVENING) 90 tablet 6  . valsartan (DIOVAN) 80 MG tablet Take 1 tablet (80 mg total) by mouth daily. 90 tablet 3  . allopurinol (ZYLOPRIM) 100 MG tablet Take 1 tablet (100 mg total) by mouth daily. 30 tablet 1    Home: Home Living Family/patient expects to be discharged to:: Private residence Living Arrangements: Alone Available Help at Discharge: Family, Available PRN/intermittently Type of Home: House Home Access: Stairs to enter Entrance Stairs-Number of Steps: 2 Entrance Stairs-Rails: None Home Layout: One   level Home Equipment: None  Functional History: Prior Function Level of Independence: Independent  Functional Status:  Mobility: Bed Mobility Overal bed mobility: Needs Assistance Bed Mobility: Supine to Sit Supine to sit: HOB elevated, Mod assist General bed mobility comments: mod handheld assist to pull to sit once LEs cleared EOB; trunk girth somewhat limiting anterior weight shift and ability to pull forward to sit  up Transfers Overall transfer level: Needs assistance Equipment used: Rolling walker (2 wheeled) Transfers: Sit to/from Stand, Stand Pivot Transfers Sit to Stand: Mod assist Stand pivot transfers: Mod assist General transfer comment: Cues for technqiue, safety and hand placement; mod assist to rise; demo cues to "heel-toe pivot" on L eft foot; minimal hopping to chair with rW      ADL:    Cognition: Cognition Overall Cognitive Status: Within Functional Limits for tasks assessed Orientation Level: Oriented X4 Cognition Arousal/Alertness: Awake/alert Behavior During Therapy: WFL for tasks assessed/performed Overall Cognitive Status: Within Functional Limits for tasks assessed  Physical Exam: Blood pressure 134/75, pulse 65, temperature 98.1 F (36.7 C), temperature source Oral, resp. rate 18, height 5' 11" (1.803 m), weight 113.626 kg (250 lb 8 oz), SpO2 99 %. Physical Exam Constitutional: He is oriented to person, place, and time. He appears well-developed and well-nourished.  HENT:  Head: Normocephalic and atraumatic.  Eyes: Conjunctivae and EOM are normal.  Neck: Normal range of motion. Neck supple. No thyromegaly present.  Cardiovascular: Normal rate.  Cardiac rate controlled  Respiratory: Effort normal and breath sounds normal. No respiratory distress.  GI: Soft. Bowel sounds are normal. He exhibits no distension.  Musculoskeletal:  Manual muscle testing 5/5 bilateral deltoid, biceps, triceps, grip 4/5 in the left hip flexor and extensor, 3 minus ankle dorsiflexor and plantar flexor 3 minus/5 right hip flexion as well as hip adduction, BKA  Neurological: He is alert and oriented to person, place, and time.  Sensation intact to light touch In both upper extremities, absent to light touch below the knee in the left lower extremity, absent proprioception left great toe, intact proprioception left ankle Skin: Skin is warm and dry.  Right BKA site is dressed  appropriately tender  Psychiatric: He has a normal mood and affect. His behavior is normal    Lab Results Last 48 Hours    Results for orders placed or performed during the hospital encounter of 05/20/15 (from the past 48 hour(s))  Basic metabolic panel Status: Abnormal   Collection Time: 05/21/15 7:21 AM  Result Value Ref Range   Sodium 137 135 - 145 mmol/L   Potassium 3.2 (L) 3.5 - 5.1 mmol/L   Chloride 103 101 - 111 mmol/L   CO2 24 22 - 32 mmol/L   Glucose, Bld 111 (H) 65 - 99 mg/dL   BUN 39 (H) 6 - 20 mg/dL   Creatinine, Ser 1.55 (H) 0.61 - 1.24 mg/dL   Calcium 8.2 (L) 8.9 - 10.3 mg/dL   GFR calc non Af Amer 49 (L) >60 mL/min   GFR calc Af Amer 57 (L) >60 mL/min    Comment: (NOTE) The eGFR has been calculated using the CKD EPI equation. This calculation has not been validated in all clinical situations. eGFR's persistently <60 mL/min signify possible Chronic Kidney Disease.    Anion gap 10 5 - 15  CBC Status: Abnormal   Collection Time: 05/21/15 7:21 AM  Result Value Ref Range   WBC 6.3 4.0 - 10.5 K/uL   RBC 3.49 (L) 4.22 - 5.81 MIL/uL   Hemoglobin   9.1 (L) 13.0 - 17.0 g/dL   HCT 28.5 (L) 39.0 - 52.0 %   MCV 81.7 78.0 - 100.0 fL   MCH 26.1 26.0 - 34.0 pg   MCHC 31.9 30.0 - 36.0 g/dL   RDW 15.5 11.5 - 15.5 %   Platelets 294 150 - 400 K/uL  Magnesium Status: None   Collection Time: 05/21/15 7:21 AM  Result Value Ref Range   Magnesium 1.8 1.7 - 2.4 mg/dL  Glucose, capillary Status: Abnormal   Collection Time: 05/21/15 7:58 AM  Result Value Ref Range   Glucose-Capillary 101 (H) 65 - 99 mg/dL  Glucose, capillary Status: None   Collection Time: 05/21/15 11:16 AM  Result Value Ref Range   Glucose-Capillary 69 65 - 99 mg/dL  Glucose, capillary Status: Abnormal   Collection Time: 05/21/15 12:15 PM  Result Value  Ref Range   Glucose-Capillary 125 (H) 65 - 99 mg/dL  Glucose, capillary Status: Abnormal   Collection Time: 05/21/15 3:12 PM  Result Value Ref Range   Glucose-Capillary 61 (L) 65 - 99 mg/dL  Glucose, capillary Status: None   Collection Time: 05/21/15 3:39 PM  Result Value Ref Range   Glucose-Capillary 87 65 - 99 mg/dL  Type and screen Status: None (Preliminary result)   Collection Time: 05/21/15 4:22 PM  Result Value Ref Range   ABO/RH(D) O POS    Antibody Screen NEG    Sample Expiration 05/24/2015    Unit Number [id number]    Blood Component Type RED CELLS,LR    Unit division 00    Status of Unit ALLOCATED    Transfusion Status OK TO TRANSFUSE    Crossmatch Result Compatible    Unit Number [id number]    Blood Component Type RED CELLS,LR    Unit division 00    Status of Unit ALLOCATED    Transfusion Status OK TO TRANSFUSE    Crossmatch Result Compatible   ABO/Rh Status: None   Collection Time: 05/21/15 4:22 PM  Result Value Ref Range   ABO/RH(D) O POS   Protime-INR Status: Abnormal   Collection Time: 05/21/15 4:26 PM  Result Value Ref Range   Prothrombin Time 22.6 (H) 11.6 - 15.2 seconds   INR 2.01 (H) 0.00 - 1.49  Glucose, capillary Status: None   Collection Time: 05/21/15 5:31 PM  Result Value Ref Range   Glucose-Capillary 71 65 - 99 mg/dL  Glucose, capillary Status: Abnormal   Collection Time: 05/21/15 5:54 PM  Result Value Ref Range   Glucose-Capillary 103 (H) 65 - 99 mg/dL  CBC with Differential/Platelet Status: Abnormal   Collection Time: 05/21/15 6:48 PM  Result Value Ref Range   WBC 7.2 4.0 - 10.5 K/uL   RBC 3.44 (L) 4.22 - 5.81 MIL/uL   Hemoglobin 9.1 (L) 13.0 - 17.0 g/dL   HCT 28.3 (L) 39.0 - 52.0 %   MCV 82.3 78.0 - 100.0 fL   MCH 26.5 26.0 - 34.0  pg   MCHC 32.2 30.0 - 36.0 g/dL   RDW 15.6 (H) 11.5 - 15.5 %   Platelets 346 150 - 400 K/uL   Neutrophils Relative % 68 %   Neutro Abs 4.9 1.7 - 7.7 K/uL   Lymphocytes Relative 21 %   Lymphs Abs 1.5 0.7 - 4.0 K/uL   Monocytes Relative 8 %   Monocytes Absolute 0.6 0.1 - 1.0 K/uL   Eosinophils Relative 2 %   Eosinophils Absolute 0.1 0.0 - 0.7 K/uL   Basophils Relative 1 %   Basophils  Absolute 0.1 0.0 - 0.1 K/uL  Comprehensive metabolic panel Status: Abnormal   Collection Time: 05/21/15 6:48 PM  Result Value Ref Range   Sodium 139 135 - 145 mmol/L   Potassium 3.1 (L) 3.5 - 5.1 mmol/L   Chloride 105 101 - 111 mmol/L   CO2 24 22 - 32 mmol/L   Glucose, Bld 82 65 - 99 mg/dL   BUN 31 (H) 6 - 20 mg/dL   Creatinine, Ser 1.33 (H) 0.61 - 1.24 mg/dL   Calcium 8.3 (L) 8.9 - 10.3 mg/dL   Total Protein 5.8 (L) 6.5 - 8.1 g/dL   Albumin 1.6 (L) 3.5 - 5.0 g/dL   AST 19 15 - 41 U/L   ALT 15 (L) 17 - 63 U/L   Alkaline Phosphatase 159 (H) 38 - 126 U/L   Total Bilirubin 0.6 0.3 - 1.2 mg/dL   GFR calc non Af Amer 59 (L) >60 mL/min   GFR calc Af Amer >60 >60 mL/min    Comment: (NOTE) The eGFR has been calculated using the CKD EPI equation. This calculation has not been validated in all clinical situations. eGFR's persistently <60 mL/min signify possible Chronic Kidney Disease.    Anion gap 10 5 - 15  Prepare RBC (crossmatch) Status: None   Collection Time: 05/21/15 7:35 PM  Result Value Ref Range   Order Confirmation ORDER PROCESSED BY BLOOD BANK   Glucose, capillary Status: None   Collection Time: 05/21/15 8:43 PM  Result Value Ref Range   Glucose-Capillary 82 65 - 99 mg/dL  Glucose, capillary Status: None   Collection Time: 05/21/15 9:29 PM  Result Value Ref Range   Glucose-Capillary 73 65 - 99 mg/dL  CBC  Status: Abnormal   Collection Time: 05/22/15 4:35 AM  Result Value Ref Range   WBC 7.9 4.0 - 10.5 K/uL   RBC 3.35 (L) 4.22 - 5.81 MIL/uL   Hemoglobin 9.0 (L) 13.0 - 17.0 g/dL   HCT 28.0 (L) 39.0 - 52.0 %   MCV 83.6 78.0 - 100.0 fL   MCH 26.9 26.0 - 34.0 pg   MCHC 32.1 30.0 - 36.0 g/dL   RDW 15.6 (H) 11.5 - 15.5 %   Platelets 310 150 - 400 K/uL  Basic metabolic panel Status: Abnormal   Collection Time: 05/22/15 4:35 AM  Result Value Ref Range   Sodium 139 135 - 145 mmol/L   Potassium 4.3 3.5 - 5.1 mmol/L    Comment: DELTA CHECK NOTED   Chloride 104 101 - 111 mmol/L   CO2 28 22 - 32 mmol/L   Glucose, Bld 230 (H) 65 - 99 mg/dL   BUN 29 (H) 6 - 20 mg/dL   Creatinine, Ser 1.49 (H) 0.61 - 1.24 mg/dL   Calcium 8.2 (L) 8.9 - 10.3 mg/dL   GFR calc non Af Amer 51 (L) >60 mL/min   GFR calc Af Amer 59 (L) >60 mL/min    Comment: (NOTE) The eGFR has been calculated using the CKD EPI equation. This calculation has not been validated in all clinical situations. eGFR's persistently <60 mL/min signify possible Chronic Kidney Disease.    Anion gap 7 5 - 15  Glucose, capillary Status: Abnormal   Collection Time: 05/22/15 7:43 AM  Result Value Ref Range   Glucose-Capillary 226 (H) 65 - 99 mg/dL  Glucose, capillary Status: Abnormal   Collection Time: 05/22/15 11:20 AM  Result Value Ref Range   Glucose-Capillary 190 (H) 65 - 99 mg/dL  Glucose, capillary Status: Abnormal  Collection Time: 05/22/15 5:01 PM  Result Value Ref Range   Glucose-Capillary 216 (H) 65 - 99 mg/dL   Comment 1 Notify RN    Comment 2 Document in Chart   Glucose, capillary Status: Abnormal   Collection Time: 05/22/15 9:16 PM  Result Value Ref Range   Glucose-Capillary 195 (H) 65 - 99 mg/dL      Imaging Results (Last 48 hours)    No results found.        Medical Problem List and Plan: 1. Functional deficits secondary to right BKA secondary to PVD/ nonhealing ulcer 05/21/2015 2. DVT Prophylaxis/Anticoagulation: Pradaxa 3. Pain Management: Hydrocodone and Robaxin as needed. Monitor with increased mobility 4. Acute blood loss anemia. Follow-up CBC 5. Neuropsych: This patient is capable of making decisions on his own behalf. 6. Skin/Wound Care: Routine skin checks 7. Fluids/Electrolytes/Nutrition: Routine I&O with follow-up chemistries 8. Wound culture Proteus Mirabalis. Continue doxycycline Cipro 2 weeks initiated 05/23/2015 9. Hypertension/atrial flutter with ablation/nonischemic cardiomyopathy. Continue Pradaxa, clonidine 0.2 mg twice a day, Lanoxin 0.125 mg daily, Lopressor 75 mg twice a day, Minipress 1 mg twice a day. Cardiac rate controlled 10. Chronic systolic congestive heart failure. Demadex 20 mg twice a day. Monitor for any signs of fluid overload 11. Diabetes mellitus of peripheral neuropathy. Hemoglobin A1c 9.0. NovoLog 6 units 3 times a day, Levemir 60 units bedtime. Check blood sugars before meals and at bedtime. Diabetic teaching 12. Chronic renal insufficiency. Baseline creatinine 1.58. Follow-up chemistries 13. Hyperlipidemia. Lopid/Lipitor 14. History of gout. Zyloprim 300 mg daily. Monitor for any gouty flareup  Post Admission Physician Evaluation: 1. Functional deficits secondary to right BKA secondary to PVD/ nonhealing ulcer 05/21/2015. 2. Patient is admitted to receive collaborative, interdisciplinary care between the physiatrist, rehab nursing staff, and therapy team. 3. Patient's level of medical complexity and substantial therapy needs in context of that medical necessity cannot be provided at a lesser intensity of care such as a SNF. 4. Patient has experienced substantial functional loss from his/her baseline which was documented above under the "Functional History" and "Functional Status" headings.  Judging by the patient's diagnosis, physical exam, and functional history, the patient has potential for functional progress which will result in measurable gains while on inpatient rehab. These gains will be of substantial and practical use upon discharge in facilitating mobility and self-care at the household level. 5. Physiatrist will provide 24 hour management of medical needs as well as oversight of the therapy plan/treatment and provide guidance as appropriate regarding the interaction of the two. 6. 24 hour rehab nursing will assist with bladder management, bowel management, safety, skin/wound care, disease management, medication administration, pain management and patient education and help integrate therapy concepts, techniques,education, etc. 7. PT will assess and treat for/with: pre gait, gait training, endurance , safety, equipment, neuromuscular re education. Goals are: Supervision to modified independent. 8. OT will assess and treat for/with: ADLs, Cognitive perceptual skills, Neuromuscular re education, safety, endurance, equipment. Goals are: Supervision to modified independent. Therapy May proceed with showering this patient. 9. SLP will assess and treat for/with: NA. Goals are: NA. 10. Case Management and Social Worker will assess and treat for psychological issues and discharge planning. 11. Team conference will be held weekly to assess progress toward goals and to determine barriers to discharge. 12. Patient will receive at least 3 hours of therapy per day at least 5 days per week. 13. ELOS: 9-13d  14. Prognosis: excellent     Andrew E. Kirsteins M.D. [facility]   FAAPM&R (Sports Med, Neuromuscular Med) Diplomate Am Board of Electrodiagnostic Med  05/23/2015

## 2015-05-23 NOTE — Progress Notes (Signed)
Retta Diones, RN Rehab Admission Coordinator Signed Physical Medicine and Rehabilitation PMR Pre-admission 05/23/2015 12:33 PM  Related encounter: Admission (Current) from 05/20/2015 in [facility] Collapse All   PMR Admission Coordinator Pre-Admission Assessment  Patient: [name] is an 54 y.o., male MRN: [id number] DOB: 1960-10-07 Height: 5\' 11"  (180.3 cm) Weight: 113.626 kg (250 lb 8 oz)  Insurance Information HMO: No PPO: PCP: IPA: 80/20: OTHER:  PRIMARY: Medicare A/B Policy#: [id number] Subscriber: [name] CM Name: Phone#: Fax#:  Pre-Cert#: Employer: Disabled Benefits: Phone #: Name: Checked in [place]. Date: 11/15/93 Deduct: $1288 Out of Pocket Max: none Life Max: unlimited CIR: 100% SNF: 100 days Outpatient: 80% Co-Pay: 20% Home Health: 100% Co-Pay: none DME: 80% Co-Pay: 20% Providers: patient's choice  SECONDARY: Medicaid Policy#: [id number] Subscriber: [name] CM Name: Phone#: Fax#:  Pre-Cert#: Employer: Disabled  Benefits: Phone #: [phone] Name: Automated Eff. Date: 05/23/15 pays part B Medicare premiums only Deduct: Out of Pocket Max: Life Max:  CIR: SNF:  Outpatient: Co-Pay:  Home Health: Co-Pay:  DME: Co-Pay:   Emergency Contact Information Contact Information    Name Relation Home Work Crystal Mother [phone]  [phone]     Current Medical History  Patient Admitting Diagnosis: R BKA  History of Present Illness: A 54 y.o. right handed male with history of hypertension, atrial flutter with ablation maintained on  Pradaxa, nonischemic cardiomyopathy, chronic systolic congestive heart failure, diabetes mellitus and peripheral neuropathy. Patient lives alone in [place]. Used a cane and walker prior to admission. One level home 2 steps to entry. Admitted 05/21/2015 with chronic right ankle foot ulcer followed by Dr. Sharol Given. Noted over the past 3 weeks with increasing foul-smelling odor and drainage from wound. 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 Sharol Given. Hospital course pain management and anemia. Currently remains on broad-spectrum anti-biotics. Acute blood loss anemia 9.0 and monitored. Physical and occupational therapy evaluations are pending. M.D. has requested physical medicine rehabilitation consult.   Past Medical History  Past Medical History  Diagnosis Date  . Hypertension   . Morbid obesity ([place])   . Atrial flutter ([place]) 10/01/2006, 12/30/11    Status post RF ablation, 7/13, Dr. [name]  . OSA (obstructive sleep apnea)     compliant with CPAP  . Nonischemic cardiomyopathy ([place])     Normal coronary arteries, 2008  . Chronic systolic dysfunction of left ventricle     EF 20-25%, global HK; mild RVD, 2-D echo, 5/13  . Unspecified disorder of kidney and ureter   . RBBB, anterior fascicular block and incomplete LBBB   . Pulmonary hypertension ([facility])     RVSP 50-55 mmHg, 5/13  . Valvular heart disease     Mild MR/TR, 2-D echo, 5/13  . Type II or unspecified type diabetes mellitus without mention of complication, not stated as uncontrolled     Family History  family history includes Asthma (age of onset: 32) in his father; Diabetes in an other family member.  Prior Rehab/Hospitalizations: No previous rehab admissions.  Has the patient had major surgery during 100 days prior to admission? No  Current Medications   Current facility-administered medications:  . 0.9 % sodium  chloride infusion, , Intravenous, Continuous, Meridee Score V, MD . acetaminophen (TYLENOL) tablet 650 mg, 650 mg, Oral, Q6H PRN **OR** acetaminophen (TYLENOL) suppository 650 mg, 650 mg, Rectal, Q6H PRN, Ripudeep K Rai,  MD . allopurinol (ZYLOPRIM) tablet 300 mg, 300 mg, Oral, Daily, Ripudeep K Rai, MD, 300 mg at 05/23/15 1039 . atorvastatin (LIPITOR) tablet 10 mg, 10 mg, Oral, q morning - 10a, Ripudeep K Rai, MD, 10 mg at 05/23/15 1039 . ciprofloxacin (CIPRO) tablet 500 mg, 500 mg, Oral, BID, Ripudeep K Rai, MD . cloNIDine (CATAPRES) tablet 0.2 mg, 0.2 mg, Oral, BID, Ripudeep K Rai, MD, 0.2 mg at 05/23/15 1039 . dabigatran (PRADAXA) capsule 150 mg, 150 mg, Oral, Q12H, Ripudeep K Rai, MD, 150 mg at 05/23/15 1039 . digoxin (LANOXIN) tablet 0.125 mg, 0.125 mg, Oral, Daily, Ripudeep K Rai, MD, 0.125 mg at 05/23/15 1039 . doxycycline (VIBRA-TABS) tablet 100 mg, 100 mg, Oral, Q12H, Ripudeep K Rai, MD . feeding supplement (GLUCERNA SHAKE) (GLUCERNA SHAKE) liquid 237 mL, 237 mL, Oral, Q1500, Dale Geary, RD, 237 mL at 05/22/15 1527 . feeding supplement (PRO-STAT SUGAR FREE 64) liquid 30 mL, 30 mL, Oral, BID, Dale Trainer, RD, 30 mL at 05/23/15 1039 . gemfibrozil (LOPID) tablet 600 mg, 600 mg, Oral, BID AC, Ripudeep K Rai, MD, 600 mg at 05/23/15 0817 . HYDROcodone-acetaminophen (NORCO/VICODIN) 5-325 MG per tablet 1-2 tablet, 1-2 tablet, Oral, Q4H PRN, Ripudeep K Rai, MD . HYDROmorphone (DILAUDID) injection 1 mg, 1 mg, Intravenous, Q2H PRN, Newt Minion, MD, 1 mg at 05/22/15 [phone] . insulin aspart (novoLOG) injection 0-15 Units, 0-15 Units, Subcutaneous, TID WC, Ripudeep K Rai, MD, 5 Units at 05/22/15 1719 . insulin aspart (novoLOG) injection 0-5 Units, 0-5 Units, Subcutaneous, QHS, Ripudeep K Rai, MD, 2 Units at 05/20/15 2145 . insulin aspart (novoLOG) injection 3 Units, 3 Units, Subcutaneous, TID WC, Ripudeep K Rai, MD, 3 Units at 05/23/15 1211 . insulin detemir (LEVEMIR)  injection 60 Units, 60 Units, Subcutaneous, QHS, Ripudeep K Rai, MD, 60 Units at 05/22/15 2116 . irbesartan (AVAPRO) tablet 75 mg, 75 mg, Oral, Daily, Ripudeep K Rai, MD . meperidine (DEMEROL) injection 6.25-12.5 mg, 6.25-12.5 mg, Intravenous, Q5 min PRN, Nolon Nations, MD . methocarbamol (ROBAXIN) tablet 500 mg, 500 mg, Oral, Q6H PRN **OR** methocarbamol (ROBAXIN) 500 mg in dextrose 5 % 50 mL IVPB, 500 mg, Intravenous, Q6H PRN, Meridee Score V, MD . metoCLOPramide (REGLAN) tablet 5-10 mg, 5-10 mg, Oral, Q8H PRN **OR** metoCLOPramide (REGLAN) injection 5-10 mg, 5-10 mg, Intravenous, Q8H PRN, Meridee Score V, MD . metoprolol tartrate (LOPRESSOR) tablet 75 mg, 75 mg, Oral, BID, Ripudeep K Rai, MD, 75 mg at 05/23/15 1039 . multivitamin with minerals tablet 1 tablet, 1 tablet, Oral, Daily, Dale [place], RD, 1 tablet at 05/23/15 1039 . ondansetron (ZOFRAN) tablet 4 mg, 4 mg, Oral, Q6H PRN **OR** ondansetron (ZOFRAN) injection 4 mg, 4 mg, Intravenous, Q6H PRN, Ripudeep K Rai, MD . oxyCODONE (Oxy IR/ROXICODONE) immediate release tablet 5-10 mg, 5-10 mg, Oral, Q3H PRN, Meridee Score V, MD . potassium chloride SA (K-DUR,KLOR-CON) CR tablet 40 mEq, 40 mEq, Oral, Once, Ripudeep K Rai, MD . prazosin (MINIPRESS) capsule 1 mg, 1 mg, Oral, BID, Ripudeep K Rai, MD, 1 mg at 05/23/15 1039 . promethazine (PHENERGAN) injection 6.25-12.5 mg, 6.25-12.5 mg, Intravenous, Q15 min PRN, Nolon Nations, MD . sodium chloride 0.9 % injection 3 mL, 3 mL, Intravenous, Q12H, Ripudeep K Rai, MD, 3 mL at 05/23/15 1040 . torsemide (DEMADEX) tablet 20 mg, 20 mg, Oral, BID, Ripudeep K Rai, MD, 20 mg at 05/23/15 0817  Patients Current Diet: Diet Carb Modified Fluid consistency:: Thin; Room service appropriate?: Yes  Precautions / Restrictions Precautions Precautions: Fall Restrictions Weight Bearing  Restrictions: Yes RLE Weight Bearing: Non weight bearing   Has the patient had 2 or more falls or a fall with injury in  the past year?No  Prior Activity Level Limited Community (1-2x/wk): Went out 2 X a week. Was driving.  Home Assistive Devices / Equipment Home Assistive Devices/Equipment: Cane (specify quad or straight), Walker (specify type) Home Equipment: None  Prior Device Use: Indicate devices/aids used by the patient prior to current illness, exacerbation or injury? Walker. Used a walker after bladder tumor removed 6 months ago, but not using any device most recently.  Prior Functional Level Prior Function Level of Independence: Independent  Self Care: Did the patient need help bathing, dressing, using the toilet or eating? Independent  Indoor Mobility: Did the patient need assistance with walking from room to room (with or without device)? Independent  Stairs: Did the patient need assistance with internal or external stairs (with or without device)? Independent  Functional Cognition: Did the patient need help planning regular tasks such as shopping or remembering to take medications? Independent  Current Functional Level Cognition  Overall Cognitive Status: Within Functional Limits for tasks assessed Orientation Level: Oriented X4   Extremity Assessment (includes Sensation/Coordination)  Upper Extremity Assessment: Defer to OT evaluation  Lower Extremity Assessment: RLE deficits/detail RLE Deficits / Details: BKA; noted full extension and flexion range (slightly limited by bulky dressing)    ADLs       Mobility  Overal bed mobility: Needs Assistance Bed Mobility: Supine to Sit Supine to sit: HOB elevated, Mod assist General bed mobility comments: mod handheld assist to pull to sit once LEs cleared EOB; trunk girth somewhat limiting anterior weight shift and ability to pull forward to sit up    Transfers  Overall transfer level: Needs assistance Equipment used: Rolling walker (2 wheeled) Transfers: Sit to/from Stand, [employer] Transfers Sit to Stand: Mod  assist Stand pivot transfers: Mod assist General transfer comment: Cues for technqiue, safety and hand placement; mod assist to rise; demo cues to "heel-toe pivot" on L eft foot; minimal hopping to chair with rW    Ambulation / Gait / Stairs / Wheelchair Mobility       Posture / Balance Balance Overall balance assessment: Needs assistance Sitting balance-Leahy Scale: Fair Standing balance-Leahy Scale: Poor    Special needs/care consideration BiPAP/CPAP NO CPM No Continuous Drip IV No Dialysis: No  Life Vest No Oxygen No Special Bed No Trach Size No Wound Vac (area) No  Skin New R BKA incision  Bowel mgmt: Reports BM 05/22/15 but last documented 05/20/15 Bladder mgmt: Voiding in urinal Diabetic mgmt Yes, on insulin at home and in hospital    Previous Home Environment Living Arrangements: Alone Available Help at Discharge: Family, Available PRN/intermittently Type of Home: House Home Layout: One level Home Access: Stairs to enter Entrance Stairs-Rails: None Entrance Stairs-Number of Steps: 2 Eek: No  Discharge Living Setting Plans for Discharge Living Setting: Patient's home, Alone, House (Lives alone. Mom lives close by.) Type of Home at Discharge: House Discharge Home Layout: One level Discharge Home Access: Stairs to enter (Could potentially have a ramp built if needed.) Entrance Stairs-Number of Steps: 2 Does the patient have any problems obtaining your medications?: No  Social/Family/Support Systems Patient Roles: Other (Comment) (Has a mother who lives close by.) Contact Information: Bryson Dames - mom [phone] Anticipated Caregiver: Self, mom and other relatives Ability/Limitations of Caregiver: Mom works FT at 74 yo but she says she can assist and other family  will help out after rehab Caregiver Availability: Intermittent Discharge Plan Discussed with Primary Caregiver: Yes Is Caregiver In  Agreement with Plan?: Yes Does Caregiver/Family have Issues with Lodging/Transportation while Pt is in Rehab?: No  Goals/Additional Needs Patient/Family Goal for Rehab: PT/OT mod I and supervision goals Expected length of stay: 14-18 days Cultural Considerations: None Dietary Needs: Carb mod, med cal, thin liquids Equipment Needs: TBD Pt/Family Agrees to Admission and willing to participate: Yes Program Orientation Provided & Reviewed with Pt/Caregiver Including Roles & Responsibilities: Yes  Decrease burden of Care through IP rehab admission: N/A  Possible need for SNF placement upon discharge: Not planned  Patient Condition: This patient's condition remains as documented in the consult dated 05/22/15, in which the Rehabilitation Physician determined and documented that the patient's condition is appropriate for intensive rehabilitative care in an inpatient rehabilitation facility. Will admit to inpatient rehab today.  Preadmission Screen Completed By: Retta Diones, 05/23/2015 12:52 PM ______________________________________________________________________  Discussed status with Dr. Letta Pate on 05/23/15 at 1252 and received telephone approval for admission today.  Admission Coordinator: Retta Diones, time1252/Date10/06/16          Cosigned by: Charlett Blake, MD at 05/23/2015 1:27 PM  Revision History     Date/Time User Provider Type Action   05/23/2015 1:27 PM Charlett Blake, MD Physician Cosign   05/23/2015 12:52 PM Retta Diones, RN Rehab Admission Coordinator Sign

## 2015-05-23 NOTE — Progress Notes (Signed)
Ankit Lorie Phenix, MD Physician Signed Physical Medicine and Rehabilitation Consult Note 05/22/2015 6:14 AM  Related encounter: Admission (Current) from 05/20/2015 in [place] Collapse All        Physical Medicine and Rehabilitation Consult Reason for Consult: Right below-knee amputation Referring Physician: [facility]   HPI: [name] is a 54 y.o. right handed male with history of hypertension, atrial flutter with ablation maintained on Pradaxa, nonischemic cardiomyopathy, chronic systolic congestive heart failure, diabetes mellitus and peripheral neuropathy. Patient lives alone in [place]. Used a cane and walker prior to admission. One level home 2 steps to entry. Admitted 05/21/2015 with chronic right ankle foot ulcer followed by Dr. Sharol Given. Noted over the past 3 weeks with increasing foul-smelling odor and drainage from wound. 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 Sharol Given. Hospital course pain management and anemia. Currently remains on broad-spectrum anti-biotics. Acute blood loss anemia 9.0 and monitored. Physical and occupational therapy evaluations are pending. M.D. has requested physical medicine rehabilitation consult.   Review of Systems  Constitutional: Negative for chills.  HENT: Negative for hearing loss.  Eyes: Negative for blurred vision and double vision.  Respiratory: Negative for cough.   Shortness of breath with exertion  Gastrointestinal: Negative for nausea and vomiting.  Genitourinary: Negative for dysuria and hematuria.  Musculoskeletal: Positive for myalgias.  Skin: Negative for rash.  Neurological: Negative for seizures, loss of consciousness and headaches.  All other systems reviewed and are negative.  Past Medical History  Diagnosis Date  . Hypertension   . Morbid obesity ([place])   . Atrial flutter ([place]) 10/01/2006, 12/30/11      Status post RF ablation, 7/13, Dr. [name]  . OSA (obstructive sleep apnea)     compliant with CPAP  . Nonischemic cardiomyopathy ([place])     Normal coronary arteries, 2008  . Chronic systolic dysfunction of left ventricle     EF 20-25%, global HK; mild RVD, 2-D echo, 5/13  . Unspecified disorder of kidney and ureter   . RBBB, anterior fascicular block and incomplete LBBB   . Pulmonary hypertension ([facility])     RVSP 50-55 mmHg, 5/13  . Valvular heart disease     Mild MR/TR, 2-D echo, 5/13  . Type II or unspecified type diabetes mellitus without mention of complication, not stated as uncontrolled    Past Surgical History  Procedure Laterality Date  . Bi-ventricular implantable cardioverter defibrillator (crt-d)  10/27/2013    MDT [name] XT CRTD implanted by [name] for NICM, CHF  . Atrial flutter ablation N/A 03/04/2012    Procedure: ATRIAL FLUTTER ABLATION; Surgeon: Thompson Grayer, MD; Location: [facility] CATH LAB; Service: Cardiovascular; Laterality: N/A;  . Bi-ventricular implantable cardioverter defibrillator N/A 10/27/2013    Procedure: BI-VENTRICULAR IMPLANTABLE CARDIOVERTER DEFIBRILLATOR (CRT-D); Surgeon: Coralyn Mark, MD; Location: [facility] CATH LAB; Service: Cardiovascular; Laterality: N/A;  . Cystoscopy/retrograde/ureteroscopy Bilateral 10/16/2014    Procedure: Kathrene Alu WITH BILATERAL Freddie Breech WITH GYRUS; Surgeon: Malka So, MD; Location: [facility] ORS; Service: Urology; Laterality: Bilateral;  . Transurethral resection of bladder tumor with gyrus (turbt-gyrus) N/A 10/16/2014    Procedure: TRANSURETHRAL RESECTION OF BLADDER TUMOR WITH GYRUS (TURBT-GYRUS); Surgeon: Malka So, MD; Location: [facility] ORS; Service: Urology; Laterality: N/A;  . Cataract extraction w/phaco Right 03/25/2015    Procedure: CATARACT EXTRACTION PHACO AND INTRAOCULAR LENS PLACEMENT RIGHT EYE CDE=12.24; Surgeon: Tonny Branch,  MD; Location: [facility] ORS; Service: Ophthalmology; Laterality:  Right;  . Amputation Right 05/21/2015    Procedure: AMPUTATION BELOW KNEE; Surgeon: Newt Minion, MD; Location: [place]; Service: Orthopedics; Laterality: Right;   Family History  Problem Relation Age of Onset  . Asthma Father 38    DECEASED  . Diabetes     Social History:  reports that he has never smoked. He has never used smokeless tobacco. He reports that he does not drink alcohol or use illicit drugs. Allergies: No Known Allergies Medications Prior to Admission  Medication Sig Dispense Refill  . AFLURIA PRESERVATIVE FREE 0.5 ML SUSY Inject 0.5 mLs into the muscle once.   0  . allopurinol (ZYLOPRIM) 300 MG tablet Take 300 mg by mouth daily.  5  . atorvastatin (LIPITOR) 10 MG tablet Take 10 mg by mouth every morning.     . Canagliflozin (INVOKANA) 300 MG TABS Take 300 mg by mouth every evening.     . cloNIDine (CATAPRES) 0.2 MG tablet Take 0.2 mg by mouth 2 (two) times daily.     [name] COLCRYS 0.6 MG tablet Take 0.6 mg by mouth daily as needed (gout).     . dabigatran (PRADAXA) 150 MG CAPS capsule Take 1 capsule (150 mg total) by mouth 2 (two) times daily. 60 capsule 6  . digoxin (LANOXIN) 0.25 MG tablet Take 0.125 mg by mouth daily.    [name] gemfibrozil (LOPID) 600 MG tablet Take 600 mg by mouth 2 (two) times daily before a meal.     . insulin detemir (LEVEMIR) 100 UNIT/ML injection Inject 0.5 mLs (50 Units total) into the skin 2 (two) times daily. 10 mL 1  . metoprolol (LOPRESSOR) 50 MG tablet Take 1.5 tablets (75 mg total) by mouth 2 (two) times daily. 180 tablet 3  . NOVOLOG FLEXPEN 100 UNIT/ML FlexPen Inject 25-36 Units into the skin 3 (three) times daily with meals. Takes 25 units in am, 25 units at lunch and 36 units in pm  5  . nystatin ointment (MYCOSTATIN) Apply 1 application topically daily as needed (for fungal infection). For  fungal irritation  5  . prazosin (MINIPRESS) 1 MG capsule TAKE ONE CAPSULE BY MOUTH TWICE DAILY 60 capsule 6  . sitaGLIPtin (JANUVIA) 100 MG tablet Take 100 mg by mouth daily.    [name] spironolactone (ALDACTONE) 25 MG tablet Take 0.5 tablets (12.5 mg total) by mouth daily. 45 tablet 3  . torsemide (DEMADEX) 20 MG tablet Take 1 tablet (20 mg total) by mouth as directed. Starting today 03/06/15 TAKE TWO TABLETS (40 MG) TOTAL IN THE AM AND ONE TABLET (20 MG) TOTAL IN THE EVENING (Patient taking differently: Take 20-40 mg by mouth 2 (two) times daily. Starting today 03/06/15 TAKE TWO TABLETS (40 MG) TOTAL IN THE AM AND ONE TABLET (20 MG) TOTAL IN THE EVENING) 90 tablet 6  . valsartan (DIOVAN) 80 MG tablet Take 1 tablet (80 mg total) by mouth daily. 90 tablet 3  . allopurinol (ZYLOPRIM) 100 MG tablet Take 1 tablet (100 mg total) by mouth daily. 30 tablet 1    Home: Home Living Family/patient expects to be discharged to:: Private residence Living Arrangements: Alone Available Help at Discharge: Family, Available PRN/intermittently Type of Home: House Home Access: Stairs to enter Technical brewer of Steps: 2 Entrance Stairs-Rails: None Home Layout: One level Home Equipment: None  Functional History: Prior Function Level of Independence: Independent Functional Status:  Mobility: Bed Mobility Overal bed mobility: Needs Assistance Bed Mobility: Supine to Sit Supine to sit: HOB elevated, Mod assist  General bed mobility comments: mod handheld assist to pull to sit once LEs cleared EOB; trunk girth somewhat limiting anterior weight shift and ability to pull forward to sit up Transfers Overall transfer level: Needs assistance Equipment used: Rolling walker (2 wheeled) Transfers: Sit to/from Stand, [employer] Transfers Sit to Stand: Mod assist Stand pivot transfers: Mod assist General transfer comment: Cues for technqiue, safety and hand placement; mod  assist to rise; demo cues to "heel-toe pivot" on L eft foot; minimal hopping to chair with rW      ADL:    Cognition: Cognition Overall Cognitive Status: Within Functional Limits for tasks assessed Orientation Level: Oriented X4 Cognition Arousal/Alertness: Awake/alert Behavior During Therapy: WFL for tasks assessed/performed Overall Cognitive Status: Within Functional Limits for tasks assessed  Blood pressure 122/71, pulse 64, temperature 97.8 F (36.6 C), temperature source Oral, resp. rate 20, height 5\' 11"  (1.803 m), weight 113.626 kg (250 lb 8 oz), SpO2 97 %. Physical Exam  Vitals reviewed. Constitutional: He is oriented to person, place, and time. He appears well-developed and well-nourished.  HENT:  Head: Normocephalic and atraumatic.  Eyes: Conjunctivae and EOM are normal.  Neck: Normal range of motion. Neck supple. No thyromegaly present.  Cardiovascular: Normal rate.  Cardiac rate controlled  Respiratory: Effort normal and breath sounds normal. No respiratory distress.  GI: Soft. Bowel sounds are normal. He exhibits no distension.  Musculoskeletal:  B/l UE 4-/5 grossly LLU4+/5 grossly RLE hip flex 2/5, BKA  Neurological: He is alert and oriented to person, place, and time.  Sensation intact to light touch throughout  Skin: Skin is warm and dry.  Right BKA site is dressed appropriately tender  Psychiatric: He has a normal mood and affect. His behavior is normal.     Lab Results Last 24 Hours    Results for orders placed or performed during the hospital encounter of 05/20/15 (from the past 24 hour(s))  Glucose, capillary Status: None   Collection Time: 05/21/15 5:31 PM  Result Value Ref Range   Glucose-Capillary 71 65 - 99 mg/dL  Glucose, capillary Status: Abnormal   Collection Time: 05/21/15 5:54 PM  Result Value Ref Range   Glucose-Capillary 103 (H) 65 - 99 mg/dL  CBC with Differential/Platelet Status: Abnormal    Collection Time: 05/21/15 6:48 PM  Result Value Ref Range   WBC 7.2 4.0 - 10.5 K/uL   RBC 3.44 (L) 4.22 - 5.81 MIL/uL   Hemoglobin 9.1 (L) 13.0 - 17.0 g/dL   HCT 28.3 (L) 39.0 - 52.0 %   MCV 82.3 78.0 - 100.0 fL   MCH 26.5 26.0 - 34.0 pg   MCHC 32.2 30.0 - 36.0 g/dL   RDW 15.6 (H) 11.5 - 15.5 %   Platelets 346 150 - 400 K/uL   Neutrophils Relative % 68 %   Neutro Abs 4.9 1.7 - 7.7 K/uL   Lymphocytes Relative 21 %   Lymphs Abs 1.5 0.7 - 4.0 K/uL   Monocytes Relative 8 %   Monocytes Absolute 0.6 0.1 - 1.0 K/uL   Eosinophils Relative 2 %   Eosinophils Absolute 0.1 0.0 - 0.7 K/uL   Basophils Relative 1 %   Basophils Absolute 0.1 0.0 - 0.1 K/uL  Comprehensive metabolic panel Status: Abnormal   Collection Time: 05/21/15 6:48 PM  Result Value Ref Range   Sodium 139 135 - 145 mmol/L   Potassium 3.1 (L) 3.5 - 5.1 mmol/L   Chloride 105 101 - 111 mmol/L   CO2 24 22 - 32  mmol/L   Glucose, Bld 82 65 - 99 mg/dL   BUN 31 (H) 6 - 20 mg/dL   Creatinine, Ser 1.33 (H) 0.61 - 1.24 mg/dL   Calcium 8.3 (L) 8.9 - 10.3 mg/dL   Total Protein 5.8 (L) 6.5 - 8.1 g/dL   Albumin 1.6 (L) 3.5 - 5.0 g/dL   AST 19 15 - 41 U/L   ALT 15 (L) 17 - 63 U/L   Alkaline Phosphatase 159 (H) 38 - 126 U/L   Total Bilirubin 0.6 0.3 - 1.2 mg/dL   GFR calc non Af Amer 59 (L) >60 mL/min   GFR calc Af Amer >60 >60 mL/min   Anion gap 10 5 - 15  Prepare RBC (crossmatch) Status: None   Collection Time: 05/21/15 7:35 PM  Result Value Ref Range   Order Confirmation ORDER PROCESSED BY BLOOD BANK   Glucose, capillary Status: None   Collection Time: 05/21/15 8:43 PM  Result Value Ref Range   Glucose-Capillary 82 65 - 99 mg/dL  Glucose, capillary Status: None   Collection Time: 05/21/15 9:29 PM  Result Value Ref Range    Glucose-Capillary 73 65 - 99 mg/dL  CBC Status: Abnormal   Collection Time: 05/22/15 4:35 AM  Result Value Ref Range   WBC 7.9 4.0 - 10.5 K/uL   RBC 3.35 (L) 4.22 - 5.81 MIL/uL   Hemoglobin 9.0 (L) 13.0 - 17.0 g/dL   HCT 28.0 (L) 39.0 - 52.0 %   MCV 83.6 78.0 - 100.0 fL   MCH 26.9 26.0 - 34.0 pg   MCHC 32.1 30.0 - 36.0 g/dL   RDW 15.6 (H) 11.5 - 15.5 %   Platelets 310 150 - 400 K/uL  Basic metabolic panel Status: Abnormal   Collection Time: 05/22/15 4:35 AM  Result Value Ref Range   Sodium 139 135 - 145 mmol/L   Potassium 4.3 3.5 - 5.1 mmol/L   Chloride 104 101 - 111 mmol/L   CO2 28 22 - 32 mmol/L   Glucose, Bld 230 (H) 65 - 99 mg/dL   BUN 29 (H) 6 - 20 mg/dL   Creatinine, Ser 1.49 (H) 0.61 - 1.24 mg/dL   Calcium 8.2 (L) 8.9 - 10.3 mg/dL   GFR calc non Af Amer 51 (L) >60 mL/min   GFR calc Af Amer 59 (L) >60 mL/min   Anion gap 7 5 - 15  Glucose, capillary Status: Abnormal   Collection Time: 05/22/15 7:43 AM  Result Value Ref Range   Glucose-Capillary 226 (H) 65 - 99 mg/dL  Glucose, capillary Status: Abnormal   Collection Time: 05/22/15 11:20 AM  Result Value Ref Range   Glucose-Capillary 190 (H) 65 - 99 mg/dL      Imaging Results (Last 48 hours)    Dg Foot Complete Right  05/20/2015 CLINICAL DATA: Open heel sore for 2 weeks. Evaluate for osteomyelitis. Initial encounter. EXAM: RIGHT FOOT COMPLETE - 3+ VIEW COMPARISON: 05/20/2015 and 11/12/2014. FINDINGS: Diffuse soft tissue swelling and soft tissue emphysema are again noted, greatest within the hindfoot. There is fragmentation and collapse of the talar dome. Irregularity of the tibial plafond and and both malleoli was better demonstrated on the ankle radiographs done earlier today. No acute osseous findings are seen within the midfoot or forefoot. IMPRESSION: No significant change from  radiographs done earlier today. Some of the osseous findings may be secondary to Charcot arthropathy, although osteomyelitis remains a concern, especially given the associated soft tissue emphysema. Electronically Signed By: Richardean Sale M.D. 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 Does the need for close, 24 hr/day medical supervision in concert with the patient's rehab needs make it unreasonable for this patient to be served in a less intensive setting? Yes 2. Co-Morbidities requiring supervision/potential complications: Morbid Obesity, HTN, A flutter, CHF 3. Due to safety, skin/wound care, disease management, medication administration, pain management and patient education, does the patient require 24 hr/day rehab nursing? Yes 4. Does the patient require coordinated care of a physician, rehab nurse, PT (1.5-2 hrs/day, 5 days/week) and OT (1.5-2 hrs/day, 5 days/week) to address physical and functional deficits in the context of the above medical diagnosis(es)? Yes Addressing deficits in the following areas: balance, endurance, locomotion, strength, transferring, bathing, dressing, toileting and psychosocial support 5. Can the patient actively participate in an intensive therapy program of at least 3 hrs of therapy per day at least 5 days per week? Yes 6. The potential for patient to make measurable gains while on inpatient rehab is excellent 7. Anticipated functional outcomes upon discharge from inpatient rehab are modified independent and supervision with PT, modified independent and supervision with OT, n/a with SLP. 8. Estimated rehab length of stay to reach the above functional goals is: 14-18 days. 9. Does the patient have adequate social supports and living environment to accommodate these discharge functional goals? Potentially 10. Anticipated D/C setting: Home 11. Anticipated post D/C treatments: HH therapy  and Home excercise program 12. Overall Rehab/Functional Prognosis: excellent  RECOMMENDATIONS: This patient's condition is appropriate for continued rehabilitative care in the following setting: CIR Patient has agreed to participate in recommended program. Yes Note that insurance prior authorization may be required for reimbursement for recommended care.  Comment: Rehab Admissions Coordinator to follow up.  Delice Lesch, MD 05/22/2015       Revision History     Date/Time User Provider Type Action   05/22/2015 4:41 PM Ankit Lorie Phenix, MD Physician Sign   05/22/2015 6:55 AM Cathlyn Parsons, PA-C Physician Assistant Pend   View Details Report       Routing History     Date/Time From To Method   05/22/2015 4:41 PM Ankit Lorie Phenix, MD Ankit Lorie Phenix, MD In [facility]   05/22/2015 4:41 PM Ankit Lorie Phenix, MD [name], MD Fax

## 2015-05-23 NOTE — Progress Notes (Signed)
Rehab admissions - Evaluated for possible admission.  I met with patient.  He lives alone.  Mom lives close by, but she works.  Patient would like inpatient rehab prior to home.  Call me for questions.  [phone]

## 2015-05-23 NOTE — Progress Notes (Signed)
Patient admitted to room 4 W 13, via bed with belongings. Patient given admission packet, and discussed safety plan and agreement. Patient oriented to room, and call bell. All questions answered. Will continue plan of care.

## 2015-05-23 NOTE — PMR Pre-admission (Signed)
PMR Admission Coordinator Pre-Admission Assessment  Patient: [name] is an 54 y.o., male MRN: [id number] DOB: 10-Aug-1961 Height: 5\' 11"  (180.3 cm) Weight: 113.626 kg (250 lb 8 oz)              Insurance Information HMO: No    PPO:       PCP:       IPA:       80/20:       OTHER:   PRIMARY: Medicare A/B      Policy#: [id number]      Subscriber: [name] CM Name:        Phone#:       Fax#:   Pre-Cert#:        Employer: Disabled Benefits:  Phone #:       Name: Checked in [place]. Date: 11/15/93     Deduct: $1288      Out of Pocket Max: none      Life Max: unlimited CIR: 100%      SNF: 100 days Outpatient: 80%     Co-Pay: 20% Home Health: 100%      Co-Pay: none DME: 80%     Co-Pay: 20% Providers: patient's choice  SECONDARY: Medicaid      Policy#: [id number]      Subscriber: [name] CM Name:        Phone#:       Fax#:   Pre-Cert#:        Employer:  Disabled  Benefits:  Phone #: [phone]     Name: Automated Eff. Date: 05/23/15 pays part B Medicare premiums only     Deduct:        Out of Pocket Max:        Life Max:   CIR:        SNF:    Outpatient:       Co-Pay:   Home Health:        Co-Pay:   DME:       Co-Pay:    Emergency Contact Information Contact Information    Name Relation Home Work Vicco Mother [phone]  [phone]     Current Medical History  Patient Admitting Diagnosis:  R BKA  History of Present Illness: A 54 y.o. right handed male with history of hypertension, atrial flutter with ablation maintained on Pradaxa, nonischemic cardiomyopathy, chronic systolic congestive heart failure, diabetes mellitus and peripheral neuropathy. Patient lives alone in [place]. Used a cane and walker prior to admission. One level home 2 steps to entry. Admitted 05/21/2015 with chronic right ankle foot ulcer followed by Dr. Sharol Given. Noted over the past 3 weeks with increasing foul-smelling odor and drainage from wound. 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 Sharol Given. Hospital course pain management and anemia. Currently remains on broad-spectrum anti-biotics. Acute blood loss anemia 9.0 and monitored. Physical and occupational therapy evaluations are pending. M.D. has requested physical medicine rehabilitation consult.    Past Medical History  Past Medical History  Diagnosis Date  . Hypertension   . Morbid obesity ([place])   . Atrial flutter ([place])  10/01/2006, 12/30/11    Status post RF ablation, 7/13, Dr. [name]  . OSA (obstructive sleep apnea)     compliant with CPAP  . Nonischemic cardiomyopathy ([place])     Normal coronary arteries, 2008  . Chronic systolic dysfunction of left ventricle     EF 20-25%, global HK; mild  RVD, 2-D echo, 5/13  . Unspecified disorder of kidney and ureter   . RBBB, anterior fascicular block and incomplete LBBB   . Pulmonary hypertension (HCC)     RVSP 50-55 mmHg, 5/13  . Valvular heart disease     Mild MR/TR, 2-D echo, 5/13  . Type II or unspecified type diabetes mellitus without mention of complication, not stated as uncontrolled     Family History  family history includes Asthma (age of onset: 18) in his father; Diabetes in an other family member.  Prior Rehab/Hospitalizations: No previous rehab admissions.  Has the patient had major surgery during 100 days prior to admission? No  Current Medications   Current facility-administered medications:  .  0.9 %  sodium chloride infusion, , Intravenous, Continuous, Meridee Score V, MD .  acetaminophen (TYLENOL) tablet 650 mg, 650 mg, Oral, Q6H PRN **OR** acetaminophen (TYLENOL) suppository 650 mg, 650 mg, Rectal, Q6H PRN, Ripudeep K Rai, MD .  allopurinol (ZYLOPRIM) tablet 300 mg, 300 mg, Oral, Daily, Ripudeep K Rai, MD, 300 mg at 05/23/15 1039 .  atorvastatin (LIPITOR) tablet 10 mg, 10 mg, Oral, q morning - 10a, Ripudeep K Rai, MD, 10 mg at 05/23/15 1039 .  ciprofloxacin (CIPRO) tablet  500 mg, 500 mg, Oral, BID, Ripudeep K Rai, MD .  cloNIDine (CATAPRES) tablet 0.2 mg, 0.2 mg, Oral, BID, Ripudeep K Rai, MD, 0.2 mg at 05/23/15 1039 .  dabigatran (PRADAXA) capsule 150 mg, 150 mg, Oral, Q12H, Ripudeep K Rai, MD, 150 mg at 05/23/15 1039 .  digoxin (LANOXIN) tablet 0.125 mg, 0.125 mg, Oral, Daily, Ripudeep K Rai, MD, 0.125 mg at 05/23/15 1039 .  doxycycline (VIBRA-TABS) tablet 100 mg, 100 mg, Oral, Q12H, Ripudeep K Rai, MD .  feeding supplement (GLUCERNA SHAKE) (GLUCERNA SHAKE) liquid 237 mL, 237 mL, Oral, Q1500, Dale Carl, RD, 237 mL at 05/22/15 1527 .  feeding supplement (PRO-STAT SUGAR FREE 64) liquid 30 mL, 30 mL, Oral, BID, Dale Arbovale, RD, 30 mL at 05/23/15 1039 .  gemfibrozil (LOPID) tablet 600 mg, 600 mg, Oral, BID AC, Ripudeep K Rai, MD, 600 mg at 05/23/15 0817 .  HYDROcodone-acetaminophen (NORCO/VICODIN) 5-325 MG per tablet 1-2 tablet, 1-2 tablet, Oral, Q4H PRN, Ripudeep K Rai, MD .  HYDROmorphone (DILAUDID) injection 1 mg, 1 mg, Intravenous, Q2H PRN, Newt Minion, MD, 1 mg at 05/22/15 [id number] .  insulin aspart (novoLOG) injection 0-15 Units, 0-15 Units, Subcutaneous, TID WC, Ripudeep K Rai, MD, 5 Units at 05/22/15 1719 .  insulin aspart (novoLOG) injection 0-5 Units, 0-5 Units, Subcutaneous, QHS, Ripudeep K Rai, MD, 2 Units at 05/20/15 2145 .  insulin aspart (novoLOG) injection 3 Units, 3 Units, Subcutaneous, TID WC, Ripudeep K Rai, MD, 3 Units at 05/23/15 1211 .  insulin detemir (LEVEMIR) injection 60 Units, 60 Units, Subcutaneous, QHS, Ripudeep K Rai, MD, 60 Units at 05/22/15 2116 .  irbesartan (AVAPRO) tablet 75 mg, 75 mg, Oral, Daily, Ripudeep K Rai, MD .  meperidine (DEMEROL) injection 6.25-12.5 mg, 6.25-12.5 mg, Intravenous, Q5 min PRN, Nolon Nations, MD .  methocarbamol (ROBAXIN) tablet 500 mg, 500 mg, Oral, Q6H PRN **OR** methocarbamol (ROBAXIN) 500 mg in dextrose 5 % 50 mL IVPB, 500 mg, Intravenous, Q6H PRN, Meridee Score V, MD .  metoCLOPramide (REGLAN)  tablet 5-10 mg, 5-10 mg, Oral, Q8H PRN **OR** metoCLOPramide (REGLAN) injection 5-10 mg, 5-10 mg, Intravenous, Q8H PRN, Meridee Score V, MD .  metoprolol tartrate (LOPRESSOR) tablet 75 mg, 75 mg, Oral, BID, Ripudeep Krystal Eaton, MD,  75 mg at 05/23/15 1039 .  multivitamin with minerals tablet 1 tablet, 1 tablet, Oral, Daily, Dale Eldorado, RD, 1 tablet at 05/23/15 1039 .  ondansetron (ZOFRAN) tablet 4 mg, 4 mg, Oral, Q6H PRN **OR** ondansetron (ZOFRAN) injection 4 mg, 4 mg, Intravenous, Q6H PRN, Ripudeep K Rai, MD .  oxyCODONE (Oxy IR/ROXICODONE) immediate release tablet 5-10 mg, 5-10 mg, Oral, Q3H PRN, Meridee Score V, MD .  potassium chloride SA (K-DUR,KLOR-CON) CR tablet 40 mEq, 40 mEq, Oral, Once, Ripudeep K Rai, MD .  prazosin (MINIPRESS) capsule 1 mg, 1 mg, Oral, BID, Ripudeep K Rai, MD, 1 mg at 05/23/15 1039 .  promethazine (PHENERGAN) injection 6.25-12.5 mg, 6.25-12.5 mg, Intravenous, Q15 min PRN, Nolon Nations, MD .  sodium chloride 0.9 % injection 3 mL, 3 mL, Intravenous, Q12H, Ripudeep K Rai, MD, 3 mL at 05/23/15 1040 .  torsemide (DEMADEX) tablet 20 mg, 20 mg, Oral, BID, Ripudeep K Rai, MD, 20 mg at 05/23/15 0355  Patients Current Diet: Diet Carb Modified Fluid consistency:: Thin; Room service appropriate?: Yes  Precautions / Restrictions Precautions Precautions: Fall Restrictions Weight Bearing Restrictions: Yes RLE Weight Bearing: Non weight bearing   Has the patient had 2 or more falls or a fall with injury in the past year?No  Prior Activity Level Limited Community (1-2x/wk): Went out 2 X a week.  Was driving.  Home Assistive Devices / Equipment Home Assistive Devices/Equipment: Cane (specify quad or straight), Walker (specify type) Home Equipment: None  Prior Device Use: Indicate devices/aids used by the patient prior to current illness, exacerbation or injury? Walker.  Used a walker after bladder tumor removed 6 months ago, but not using any device most recently.  Prior  Functional Level Prior Function Level of Independence: Independent  Self Care: Did the patient need help bathing, dressing, using the toilet or eating?  Independent  Indoor Mobility: Did the patient need assistance with walking from room to room (with or without device)? Independent  Stairs: Did the patient need assistance with internal or external stairs (with or without device)? Independent  Functional Cognition: Did the patient need help planning regular tasks such as shopping or remembering to take medications? Independent  Current Functional Level Cognition  Overall Cognitive Status: Within Functional Limits for tasks assessed Orientation Level: Oriented X4    Extremity Assessment (includes Sensation/Coordination)  Upper Extremity Assessment: Defer to OT evaluation  Lower Extremity Assessment: RLE deficits/detail RLE Deficits / Details: BKA; noted full extension and flexion range (slightly limited by bulky dressing)    ADLs       Mobility  Overal bed mobility: Needs Assistance Bed Mobility: Supine to Sit Supine to sit: HOB elevated, Mod assist General bed mobility comments: mod handheld assist to pull to sit once LEs cleared EOB; trunk girth somewhat limiting anterior weight shift and ability to pull forward to sit up    Transfers  Overall transfer level: Needs assistance Equipment used: Rolling walker (2 wheeled) Transfers: Sit to/from Stand, [employer] Transfers Sit to Stand: Mod assist Stand pivot transfers: Mod assist General transfer comment: Cues for technqiue, safety and hand placement; mod assist to rise; demo cues to "heel-toe pivot" on L eft foot; minimal hopping to chair with rW    Ambulation / Gait / Stairs / Wheelchair Mobility       Posture / Balance Balance Overall balance assessment: Needs assistance Sitting balance-Leahy Scale: Fair Standing balance-Leahy Scale: Poor    Special needs/care consideration BiPAP/CPAP NO CPM No Continuous Drip IV  No Dialysis: No          Life Vest No Oxygen No Special Bed No Trach Size No Wound Vac (area) No  Skin New R BKA incision                            Bowel mgmt: Reports BM 05/22/15 but last documented 05/20/15 Bladder mgmt: Voiding in urinal Diabetic mgmt Yes, on insulin at home and in hospital    Previous Home Environment Living Arrangements: Alone Available Help at Discharge: Family, Available PRN/intermittently Type of Home: House Home Layout: One level Home Access: Stairs to enter Entrance Stairs-Rails: None Entrance Stairs-Number of Steps: 2 [place]: No  Discharge Living Setting Plans for Discharge Living Setting: Patient's home, Alone, House (Lives alone.  Mom lives close by.) Type of Home at Discharge: House Discharge Home Layout: One level Discharge Home Access: Stairs to enter (Could potentially have a ramp built if needed.) Entrance Stairs-Number of Steps: 2 Does the patient have any problems obtaining your medications?: No  Social/Family/Support Systems Patient Roles: Other (Comment) (Has a mother who lives close by.) Contact Information: Bryson Dames - mom [phone] Anticipated Caregiver: Self, mom and other relatives Ability/Limitations of Caregiver: Mom works FT at 46 yo but she says she can assist and other family will help out after rehab Caregiver Availability: Intermittent Discharge Plan Discussed with Primary Caregiver: Yes Is Caregiver In Agreement with Plan?: Yes Does Caregiver/Family have Issues with Lodging/Transportation while Pt is in Rehab?: No  Goals/Additional Needs Patient/Family Goal for Rehab: PT/OT mod I and supervision goals Expected length of stay: 14-18 days Cultural Considerations: None Dietary Needs: Carb mod, med cal, thin liquids Equipment Needs: TBD Pt/Family Agrees to Admission and willing to participate: Yes Program Orientation Provided & Reviewed with Pt/Caregiver Including Roles  & Responsibilities:  Yes  Decrease burden of Care through IP rehab admission: N/A  Possible need for SNF placement upon discharge: Not planned  Patient Condition: This patient's condition remains as documented in the consult dated 05/22/15, in which the Rehabilitation Physician determined and documented that the patient's condition is appropriate for intensive rehabilitative care in an inpatient rehabilitation facility. Will admit to inpatient rehab today.  Preadmission Screen Completed By:  Retta Diones, 05/23/2015 12:52 PM ______________________________________________________________________   Discussed status with Dr. Letta Pate on 05/23/15 at 1252 and received telephone approval for admission today.  Admission Coordinator:  Retta Diones, time1252/Date10/06/16

## 2015-05-23 NOTE — Discharge Summary (Signed)
Physician Discharge Summary   Patient ID: [name] MRN: [id number] DOB/AGE: July 26, 1961 54 y.o.  Admit date: 05/20/2015 Discharge date: 05/23/2015  Primary Care Physician:  [name], MD  Discharge Diagnoses:    . Chronic osteomyelitis ([place]) . Right foot ulcer ([place]) status post right BKA  . OBESITY-MORBID (>100') . HLD (hyperlipidemia) . Essential hypertension . Chronic systolic dysfunction of left ventricle . Atrial flutter ([place]) . DM (diabetes mellitus) type 2, uncontrolled  Consults:  Orthopedics, Dr. Sharol Given   Recommendations for Outpatient Follow-up:  Please note patient is on Levemir 60 units daily qhs while inpatient. He will likely need his outpatient insulin regimen titrated/adjusted prior to discharge from inpatient rehab  Please note antibiotics changed to doxycycline and ciprofloxacin for 2 weeks starting today  TESTS THAT NEED FOLLOW-UP Hemoglobin A1c, CBC, BMET   DIET: Heart modified diet    Allergies:  No Known Allergies   Discharge Medications:   Medication List    TAKE these medications        AFLURIA PRESERVATIVE FREE 0.5 ML Susy  Generic drug:  Influenza Virus Vacc Split PF  Inject 0.5 mLs into the muscle once.     allopurinol 300 MG tablet  Commonly known as:  ZYLOPRIM  Take 300 mg by mouth daily.     atorvastatin 10 MG tablet  Commonly known as:  LIPITOR  Take 10 mg by mouth every morning.     cloNIDine 0.2 MG tablet  Commonly known as:  CATAPRES  Take 0.2 mg by mouth 2 (two) times daily.     COLCRYS 0.6 MG tablet  Generic drug:  colchicine  Take 0.6 mg by mouth daily as needed (gout).     dabigatran 150 MG Caps capsule  Commonly known as:  PRADAXA  Take 1 capsule (150 mg total) by mouth 2 (two) times daily.     digoxin 0.25 MG tablet  Commonly known as:  LANOXIN  Take 0.125 mg by mouth daily.     gemfibrozil 600 MG tablet  Commonly known as:  LOPID  Take 600 mg by mouth 2 (two) times daily before a meal.      insulin detemir 100 UNIT/ML injection  Commonly known as:  LEVEMIR  Inject 0.5 mLs (50 Units total) into the skin 2 (two) times daily.     INVOKANA 300 MG Tabs tablet  Generic drug:  canagliflozin  Take 300 mg by mouth every evening.     metoprolol 50 MG tablet  Commonly known as:  LOPRESSOR  Take 1.5 tablets (75 mg total) by mouth 2 (two) times daily.     NOVOLOG FLEXPEN 100 UNIT/ML FlexPen  Generic drug:  insulin aspart  Inject 25-36 Units into the skin 3 (three) times daily with meals. Takes 25 units in am, 25 units at lunch and 36 units in pm     nystatin ointment  Commonly known as:  MYCOSTATIN  Apply 1 application topically daily as needed (for fungal infection). For fungal irritation     prazosin 1 MG capsule  Commonly known as:  MINIPRESS  TAKE ONE CAPSULE BY MOUTH TWICE DAILY     sitaGLIPtin 100 MG tablet  Commonly known as:  JANUVIA  Take 100 mg by mouth daily.     spironolactone 25 MG tablet  Commonly known as:  ALDACTONE  Take 0.5 tablets (12.5 mg total) by mouth daily.     torsemide 20 MG tablet  Commonly known as:  DEMADEX  Take 1 tablet (20 mg total) by  mouth as directed. Starting today 03/06/15 TAKE TWO TABLETS (40 MG) TOTAL  IN THE AM AND ONE TABLET (20 MG) TOTAL IN THE EVENING     valsartan 80 MG tablet  Commonly known as:  DIOVAN  Take 1 tablet (80 mg total) by mouth daily.       Inpatient medications Ciprofloxacin 500 mg by mouth twice a day for 2 weeks starting 10/6 Doxycycline 100 mg twice a day for 2 weeks Dilaudid 1 mg every 12 hours as needed for severe unresolved breakthrough pain Hydrocodone/acetaminophen 5/325, 1-2 tablets every 4 hours as needed for pain   Brief H and P: For complete details please refer to admission H and P, but in brief Patient is a 54 year old male with diabetes mellitus on insulin, and chronic CHF systolic, EF 25% per echo in 9/15, atrial fibrillation on pradaxa , hypertension, hyperlipidemia, chronic right foot ulcer,  has been following Dr. Sharol Given outpatient presented to [facility] ED with worsening right foot ulcer. History was obtained from the patient who reported that he has the right foot ulcer for the last 2 months. He had been wearing orthopedics brace to walk which was rubbing too close to the ankle and made the ulcer worse. He was seen by Dr. Sharol Given in the office a month ago and per patient was placed on doxycycline for 3 weeks. Patient noticed that in the last few days the ulcer was becoming more foul-smelling, draining, increasing in size and patient was having chills. He went to the [facility] ER  and patient was sent to [facility] for further management.   Hospital Course:   Right foot ulcer ([name]) with osteomyelitis with underlying uncontrolled IDDM; postop day # 2, status post BKA - xray report showed soft tissue emphysema, possible osteomyelitis especially given the associated soft tissue emphysema - Patienton IV vancomycin and Zosyn. Blood cultures and wound cultures at [facility] both positive for Proteus mirabilis, sensitive to Zosyn, Rocephin and ciprofloxacin. The patient has been on vancomycin and Zosyn since the hospitalization. Repeat blood cultures at our facility has been negative so far. - Orthopedics was consulted and patient underwent right BKA on 05/21/15 - Transitioned to oral antibiotics, doxycycline and ciprofloxacin for 2 weeks. - Follow up outpatient with Dr. Sharol Given in 2 weeks. Patient stable for discharge to inpatient rehab.  Active Problems:  Proteus mirabilis bacteremia, proteus in wound culture  - Blood cultures from [facility] reviewed, patient's blood cx was positive for Proteus mirabilis (also same in wound culture). Sensitivities reviewed - Patient was continued on IV vancomycin and Zosyn while inpatient, transitioned to oral antibiotics doxycycline and ciprofloxacin for 2 weeks.   Insulin-dependent diabetes mellitus - Hemoglobin A1c 9.0 Patient is on Levemir 60 units at  bed time, meal coverage and sliding scale inpatient. He will likely need adjustment of his outpatient insulin regimen closer to discharge from rehab.   Mild acute on chronic renal insufficiency: Patient presented with creatinine of 1.58, baseline 1.0, likely due to #1 - Creatinine improved, AKI resolved, continue torsemide. Please note that ACE inhibitor and spironolactone have been held during hospitalization. Restart valsartan today, follow BMET.     OBESITY-MORBID (>100') - Patient strongly counseled on diet and weight control   Essential hypertension - Currently stable, continue metoprolol, clonidine, prazosin   Atrial flutter  - Currently rate controlled continue metoprolol, digoxin - Pradaxa was held for surgery, now restarted postoperatively   Chronic systolic dysfunction of left ventricle - Currently compensated, EF 35% per 2-D echo in 9/15 -  Creatinine function 1.0, continue torsemide - spironolactone and ACEI were held due to AKI on admission, restarted diovan   HLD (hyperlipidemia) - Continue statin  Day of Discharge BP 130/71 mmHg  Pulse 70  Temp(Src) 98.1 F (36.7 C) (Oral)  Resp 17  Ht 5\' 11"  (1.803 m)  Wt 113.626 kg (250 lb 8 oz)  BMI 34.95 kg/m2  SpO2 98%  Physical Exam: General: Alert and awake oriented x3 not in any acute distress. HEENT: anicteric sclera, pupils reactive to light and accommodation CVS: S1-S2 clear no murmur rubs or gallops Chest: clear to auscultation bilaterally, no wheezing rales or rhonchi Abdomen: soft nontender, nondistended, normal bowel sounds Extremities: no cyanosis, clubbing, right BKA Neuro: Cranial nerves II-XII intact, no focal neurological deficits   The results of significant diagnostics from this hospitalization (including imaging, microbiology, ancillary and laboratory) are listed below for reference.    LAB RESULTS: Basic Metabolic Panel:  Recent Labs Lab 05/21/15 0721  05/22/15 0435 05/23/15 0623  NA  137  < > 139 138  K 3.2*  < > 4.3 3.1*  CL 103  < > 104 103  CO2 24  < > 28 27  GLUCOSE 111*  < > 230* 94  BUN 39*  < > 29* 22*  CREATININE 1.55*  < > 1.49* 1.04  CALCIUM 8.2*  < > 8.2* 7.8*  MG 1.8  --   --   --   < > = values in this interval not displayed. Liver Function Tests:  Recent Labs Lab 05/20/15 1833 05/21/15 1848  AST 20 19  ALT 16* 15*  ALKPHOS 174* 159*  BILITOT 1.0 0.6  PROT 5.9* 5.8*  ALBUMIN 1.5* 1.6*   No results for input(s): LIPASE, AMYLASE in the last 168 hours. No results for input(s): AMMONIA in the last 168 hours. CBC:  Recent Labs Lab 05/21/15 1848 05/22/15 0435 05/23/15 0623  WBC 7.2 7.9 9.7  NEUTROABS 4.9  --   --   HGB 9.1* 9.0* 8.6*  HCT 28.3* 28.0* 27.4*  MCV 82.3 83.6 84.3  PLT 346 310 374   Cardiac Enzymes: No results for input(s): CKTOTAL, CKMB, CKMBINDEX, TROPONINI in the last 168 hours. BNP: Invalid input(s): POCBNP CBG:  Recent Labs Lab 05/23/15 0738 05/23/15 1121  GLUCAP 87 105*    Significant Diagnostic Studies:  Dg Foot Complete Right  05/20/2015   CLINICAL DATA:  Open heel sore for 2 weeks. Evaluate for osteomyelitis. Initial encounter.  EXAM: RIGHT FOOT COMPLETE - 3+ VIEW  COMPARISON:  05/20/2015 and 11/12/2014.  FINDINGS: Diffuse soft tissue swelling and soft tissue emphysema are again noted, greatest within the hindfoot. There is fragmentation and collapse of the talar dome. Irregularity of the tibial plafond and and both malleoli was better demonstrated on the ankle radiographs done earlier today. No acute osseous findings are seen within the midfoot or forefoot.  IMPRESSION: No significant change from radiographs done earlier today. Some of the osseous findings may be secondary to Charcot arthropathy, although osteomyelitis remains a concern, especially given the associated soft tissue emphysema.   Electronically Signed   By: Richardean Sale M.D.   On: 05/20/2015 19:41    2D ECHO:   Disposition and  Follow-up: Discharge Instructions    Amb Referral to Nutrition and Diabetic E    Complete by:  As directed             DISPOSITION: Inpatient rehabilitation   DISCHARGE FOLLOW-UP Follow-up Information    Follow up with DUDA,MARCUS  V, MD. Schedule an appointment as soon as possible for a visit in 2 weeks.   Specialty:  Orthopedic Surgery   Why:  for hospital follow-up   Contact information:   [address] [place] [address] [phone]       Follow up with [facility], MD. Schedule an appointment as soon as possible for a visit in 2 weeks.   Specialty:  Internal Medicine   Why:  for hospital follow-up   Contact information:   [address]  [place] [address] [phone]        Time spent on Discharge: 40 mins   Signed:   Itali Mckendry M.D. [facility] 05/23/2015, 12:26 PM Pager: [phone]

## 2015-05-24 ENCOUNTER — Inpatient Hospital Stay (HOSPITAL_COMMUNITY): Payer: Medicare Other | Admitting: Occupational Therapy

## 2015-05-24 ENCOUNTER — Inpatient Hospital Stay (HOSPITAL_COMMUNITY): Payer: Medicare Other | Admitting: Physical Therapy

## 2015-05-24 DIAGNOSIS — Z89511 Acquired absence of right leg below knee: Secondary | ICD-10-CM

## 2015-05-24 DIAGNOSIS — E1165 Type 2 diabetes mellitus with hyperglycemia: Secondary | ICD-10-CM

## 2015-05-24 DIAGNOSIS — E1142 Type 2 diabetes mellitus with diabetic polyneuropathy: Secondary | ICD-10-CM

## 2015-05-24 LAB — COMPREHENSIVE METABOLIC PANEL
ALT: 19 U/L (ref 17–63)
ANION GAP: 9 (ref 5–15)
AST: 23 U/L (ref 15–41)
Albumin: 1.5 g/dL — ABNORMAL LOW (ref 3.5–5.0)
Alkaline Phosphatase: 179 U/L — ABNORMAL HIGH (ref 38–126)
BUN: 23 mg/dL — ABNORMAL HIGH (ref 6–20)
CALCIUM: 7.6 mg/dL — AB (ref 8.9–10.3)
CHLORIDE: 101 mmol/L (ref 101–111)
CO2: 26 mmol/L (ref 22–32)
Creatinine, Ser: 1.1 mg/dL (ref 0.61–1.24)
Glucose, Bld: 118 mg/dL — ABNORMAL HIGH (ref 65–99)
Potassium: 3.4 mmol/L — ABNORMAL LOW (ref 3.5–5.1)
SODIUM: 136 mmol/L (ref 135–145)
Total Bilirubin: 0.5 mg/dL (ref 0.3–1.2)
Total Protein: 5.9 g/dL — ABNORMAL LOW (ref 6.5–8.1)

## 2015-05-24 LAB — CBC WITH DIFFERENTIAL/PLATELET
Basophils Absolute: 0.2 10*3/uL — ABNORMAL HIGH (ref 0.0–0.1)
Basophils Relative: 2 %
EOS ABS: 0.2 10*3/uL (ref 0.0–0.7)
EOS PCT: 2 %
HCT: 28.7 % — ABNORMAL LOW (ref 39.0–52.0)
Hemoglobin: 8.9 g/dL — ABNORMAL LOW (ref 13.0–17.0)
LYMPHS ABS: 1.6 10*3/uL (ref 0.7–4.0)
Lymphocytes Relative: 17 %
MCH: 26.2 pg (ref 26.0–34.0)
MCHC: 31 g/dL (ref 30.0–36.0)
MCV: 84.4 fL (ref 78.0–100.0)
MONO ABS: 0.6 10*3/uL (ref 0.1–1.0)
MONOS PCT: 6 %
NEUTROS ABS: 7.2 10*3/uL (ref 1.7–7.7)
NEUTROS PCT: 73 %
PLATELETS: 368 10*3/uL (ref 150–400)
RBC: 3.4 MIL/uL — ABNORMAL LOW (ref 4.22–5.81)
RDW: 15.5 % (ref 11.5–15.5)
WBC: 9.8 10*3/uL (ref 4.0–10.5)

## 2015-05-24 LAB — GLUCOSE, CAPILLARY
GLUCOSE-CAPILLARY: 108 mg/dL — AB (ref 65–99)
GLUCOSE-CAPILLARY: 98 mg/dL (ref 65–99)
GLUCOSE-CAPILLARY: 61 mg/dL — AB (ref 65–99)
GLUCOSE-CAPILLARY: 102 mg/dL — AB (ref 65–99)
GLUCOSE-CAPILLARY: 144 mg/dL — AB (ref 65–99)
Glucose-Capillary: 60 mg/dL — ABNORMAL LOW (ref 65–99)

## 2015-05-24 MED ORDER — POTASSIUM CHLORIDE CRYS ER 20 MEQ PO TBCR
20.0000 meq | EXTENDED_RELEASE_TABLET | Freq: Every day | ORAL | Status: AC
  Administered 2015-05-24 – 2015-05-26 (×3): 20 meq via ORAL
  Filled 2015-05-24 (×3): qty 1

## 2015-05-24 NOTE — Progress Notes (Signed)
Initial Nutrition Assessment  DOCUMENTATION CODES:   Obesity unspecified  INTERVENTION:   MVI daily High protein snack at bedtime  NUTRITION DIAGNOSIS:   Increased nutrient needs related to wound healing as evidenced by estimated needs.  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  Pt with good appetite eating 100% of his meals.  Agreeable to bedtime snack.  Nutrition-Focused physical exam completed. Findings are mild/moderate fat depletion in triceps area, no muscle depletion, and no edema.    Diet Order:  Diet Carb Modified Fluid consistency:: Thin; Room service appropriate?: Yes  Skin:  Wound (see comment) (Incision from recent R BKA)  Last BM:  10/6  Height:   Ht Readings from Last 1 Encounters:  05/23/15 5\' 11"  (1.803 m)   Weight:   Wt Readings from Last 1 Encounters:  05/23/15 259 lb (117.482 kg)   Ideal Body Weight:  73.1 kg  BMI:  Body mass index is 36.14 kg/(m^2).  Estimated Nutritional Needs:   Kcal:  2100-2300  Protein:  130-145 grams  Fluid:  > 2.1 L/day  EDUCATION NEEDS:   No education needs identified at this time  [place], [place], [place] Pager [phone] After Hours Pager

## 2015-05-24 NOTE — Progress Notes (Signed)
Occupational Therapy Session Note  Patient Details  Name: [name] MRN: [id number] Date of Birth: 1961-02-23  Today's Date: 05/24/2015 OT Individual Time: 5993-5701 OT Individual Time Calculation (min): 30 min    Short Term Goals: Week 1:  OT Short Term Goal 1 (Week 1): Pt will complete perineal hygiene at sit > stand with mod assist OT Short Term Goal 2 (Week 1): Pt will complete LB dressing with mod assist OT Short Term Goal 3 (Week 1): Pt will complete toilet transfers with mod assist OT Short Term Goal 4 (Week 1): Pt will complete bathing with min assist  Skilled Therapeutic Interventions/Progress Updates:  Treatment session with focus on education on care for residual limb and BUE strengthening.  Provided pt with inspection mirror and explained importance of inspecting residual limb for breakdown or injury with each dressing change.  Discussed "First Step" publication with identifying support groups, stages of loss, and care for residual limb - including desensitization and phantom limb pain.  Engaged in [place] with 3# dowel rod with pt completing 2 reps of 10 bicep curls, chest presses, and straight arm raises.  Left weight in room and encouraged pt to complete these during down times to increase UB strength as needed for transfers and sit > stand.  Therapy Documentation Precautions:  Precautions Precautions: Fall Restrictions Weight Bearing Restrictions: Yes RLE Weight Bearing: Non weight bearing General:   Vital Signs: Therapy Vitals Temp: 97.7 F (36.5 C) Temp Source: Oral Pulse Rate: 66 Resp: 18 BP: 121/64 mmHg Patient Position (if appropriate): Sitting Oxygen Therapy SpO2: 98 % O2 Device: Not Delivered Pain:  Pt with no c/o pain  See Function Navigator for Current Functional Status.   Therapy/Group: Individual Therapy  Simonne Come 05/24/2015, 3:38 PM

## 2015-05-24 NOTE — Progress Notes (Signed)
Social Work  Social Work Assessment and Plan  Patient Details  Name: [name] MRN: [id number] Date of Birth: January 30, 1961  Today's Date: 05/24/2015  Problem List:  Patient Active Problem List   Diagnosis Date Noted  . Status post below knee amputation of right lower extremity ([place]) 05/23/2015  . Amputation of right lower extremity below knee ([place]) 05/23/2015  . Chronic osteomyelitis ([place]) 05/20/2015  . Right foot ulcer ([place]) 05/20/2015  . DM (diabetes mellitus) type 2, uncontrolled 05/20/2015  . Systolic dysfunction with acute on chronic heart failure ([place]) 01/25/2015  . Bladder cancer ([place])   . Non-ischemic cardiomyopathy ([place])   . Hematuria 10/14/2014  . Bladder tumor 10/14/2014  . Cellulitis   . Atrial fibrillation with RVR ([place])   . LBBB (left bundle branch block) 10/02/2013  . Anasarca 03/25/2012  . HLD (hyperlipidemia) 03/09/2012  . Lower extremity edema 12/30/2011  . Chronic systolic dysfunction of left ventricle 07/29/2011  . Chronic anticoagulation 12/10/2010  . VENOUS INSUFFICIENCY, LEGS 05/17/2009  . OBESITY-MORBID (>100') 05/06/2009  . Essential hypertension 05/06/2009  . CARDIOMYOPATHY, SECONDARY 05/06/2009  . Atrial flutter ([place]) 05/06/2009   Past Medical History:  Past Medical History  Diagnosis Date  . Hypertension   . Morbid obesity ([place])   . Atrial flutter ([place])  10/01/2006, 12/30/11    Status post RF ablation, 7/13, Dr. [name]  . OSA (obstructive sleep apnea)     compliant with CPAP  . Nonischemic cardiomyopathy ([place])     Normal coronary arteries, 2008  . Chronic systolic dysfunction of left ventricle     EF 20-25%, global HK; mild RVD, 2-D echo, 5/13  . Unspecified disorder of kidney and ureter   . RBBB, anterior fascicular block and incomplete LBBB   . Pulmonary hypertension ([facility])     RVSP 50-55 mmHg, 5/13  . Valvular heart disease     Mild MR/TR, 2-D echo, 5/13  . Type II or unspecified type diabetes mellitus without mention of  complication, not stated as uncontrolled    Past Surgical History:  Past Surgical History  Procedure Laterality Date  . Bi-ventricular implantable cardioverter defibrillator  (crt-d)  10/27/2013    MDT [name] XT CRTD implanted by [name] for NICM, CHF  . Atrial flutter ablation N/A 03/04/2012    Procedure: ATRIAL FLUTTER ABLATION;  Surgeon: Thompson Grayer, MD;  Location: [facility] CATH LAB;  Service: Cardiovascular;  Laterality: N/A;  . Bi-ventricular implantable cardioverter defibrillator N/A 10/27/2013    Procedure: BI-VENTRICULAR IMPLANTABLE CARDIOVERTER DEFIBRILLATOR  (CRT-D);  Surgeon: Coralyn Mark, MD;  Location: [facility] CATH LAB;  Service: Cardiovascular;  Laterality: N/A;  . Cystoscopy/retrograde/ureteroscopy Bilateral 10/16/2014    Procedure: Kathrene Alu WITH BILATERAL Freddie Breech WITH GYRUS;  Surgeon: Malka So, MD;  Location: [facility] ORS;  Service: Urology;  Laterality: Bilateral;  . Transurethral resection of bladder tumor with gyrus (turbt-gyrus) N/A 10/16/2014    Procedure: TRANSURETHRAL RESECTION OF BLADDER TUMOR WITH GYRUS (TURBT-GYRUS);  Surgeon: Malka So, MD;  Location: [facility] ORS;  Service: Urology;  Laterality: N/A;  . Cataract extraction w/phaco Right 03/25/2015    Procedure: CATARACT EXTRACTION PHACO AND INTRAOCULAR LENS PLACEMENT RIGHT EYE CDE=12.24;  Surgeon: Tonny Branch, MD;  Location: [facility];  Service: Ophthalmology;  Laterality: Right;  . Amputation Right 05/21/2015    Procedure: AMPUTATION BELOW KNEE;  Surgeon: Newt Minion, MD;  Location: [place];  Service: Orthopedics;  Laterality: Right;   Social History:  reports that he has never smoked. He has never used smokeless  tobacco. He reports that he does not drink alcohol or use illicit drugs.  Family / Support Systems Marital Status: Single Patient Roles: Other (Comment) (Son & friend) Other Supports: Sarah Perdue-Mom [phone]-cell Anticipated Caregiver: [place], Mom and friends Ability/Limitations of Caregiver: Mom lives one mile  away and works fulltime, but can assist and has friends who can check on him Caregiver Availability: Intermittent Family Dynamics: Close with Mom he has no siblings and no children. He has limited supports but his Mom will make sure he has what he needs. He should do well here, he has a strong upper body and is willing to push himself in therapies.  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   Abuse/Neglect Physical Abuse: Denies Verbal Abuse: Denies Sexual Abuse: Denies Exploitation of patient/patient's resources: Denies Self-Neglect: Denies  Emotional Status Pt's affect, behavior adn adjustment status: Pt is motivated to improve, still processing his surgery since it was only three days ago. He is having pain issues and is hoping each day it will get better. He wants to return to his home where he lives alone. He is willing to push through his pain to make progress here and return home. Recent Psychosocial Issues: Other health issues-cardiac and diabetes, he feels they are being managed Pyschiatric History: No history deferred depression screen due to he feels he is doing ok with all of this and still very new. May benefit from peer support visit while here, will discuss with him. Will monitor since only first day today still adjusting to the unit and his condition. Substance Abuse History: No issues  Patient / Family Perceptions, Expectations & Goals Pt/Family understanding of illness & functional limitations: Pt is able to explain his amputation and the reason for it, he is still adjusting since only his third day. He does talk with his MD's and feels his questions are being answered and concerns addressed.   Premorbid pt/family roles/activities: Son, Friend, Home owner, church member, etc Anticipated changes in roles/activities/participation: resume at discharge Pt/family expectations/goals: Pt states: " I want to be able to take care of myself, my Mom can help but she works."  Mom states: " I will do what I can for him, he is my only child."  [employer]: None Premorbid Home Care/DME Agencies: None Transportation available at discharge: Mom and friends Resource referrals recommended: Support group (specify), Other (Comment) (Peer Support)  Discharge Planning Living Arrangements: Alone Support Systems: Friends/neighbors, Parent Type of Residence: Private residence Insurance Resources: Medicare, [employer] (specify county) ([place] only) Museum/gallery curator Resources: SSD Financial Screen Referred: No Living Expenses: Own Money Management: Patient Does the patient have any problems obtaining your medications?: No Home Management: Patient Patient/Family Preliminary Plans: Return home with Mom and friends providng intermtitent assistance. Pt will need to be mod/i level before discharging from rehab. Team is evaluating and setting goals for his stay today, will await and formulate a safe discharge plan Social Work Anticipated Follow Up Needs: HH/OP, Support Group  Clinical Impression Pleasant quiet gentleman who is willing to work in therapies and push through his pain. His amputation was only three days ago today and his is still adjusting to his loss and is having pain issues. Will discuss peer support visit while here and work on safe discharge plan. He needs to be  mod/i before going home since he is alone and only has intermittent assist.  Elease Hashimoto 05/24/2015, 10:31 AM

## 2015-05-24 NOTE — Care Management Note (Signed)
Inpatient [facility] Individual Statement of Services  Patient Name:  [name]  Date:  05/24/2015  Welcome to the [facility].  Our goal is to provide you with an individualized program based on your diagnosis and situation, designed to meet your specific needs.  With this comprehensive rehabilitation program, you will be expected to participate in at least 3 hours of rehabilitation therapies Monday-Friday, with modified therapy programming on the weekends.  Your rehabilitation program will include the following services:  Physical Therapy (PT), Occupational Therapy (OT), 24 hour per day rehabilitation nursing, Therapeutic Recreaction (TR), Case Management (Social Worker), Rehabilitation Medicine, Nutrition Services and Pharmacy Services  Weekly team conferences will be held on Wednesday to discuss your progress.  Your Social Worker will talk with you frequently to get your input and to update you on team discussions.  Team conferences with you and your family in attendance may also be held.  Expected length of stay: 14-18 days   Overall anticipated outcome: mod/i-min-stairs  Depending on your progress and recovery, your program may change. Your Social Worker will coordinate services and will keep you informed of any changes. Your Social Worker's name and contact numbers are listed  below.  The following services may also be recommended but are not provided by the [facility] will be made to provide these services after discharge if needed.  Arrangements include referral to agencies that provide these services.  Your insurance has been verified to be:  Medicare & Medicaid Your primary doctor is:  Jeannie Fend  Pertinent information will be shared with your doctor and your insurance company.  Social Worker:  Ovidio Kin, [place] or ([phone]  Information discussed with and copy given to patient by: Elease Hashimoto, 05/24/2015, 9:48 AM

## 2015-05-24 NOTE — Significant Event (Signed)
Hypoglycemic Event  CBG: 60  Treatment: 15 GM carbohydrate snack  Symptoms: None  Follow-up CBG: Time:1700 CBG Result:102  Possible Reasons for Event: Unknown  Comments/MD notified: 15 gram snack given along with dinner tray. Patient verbalized no s/s. Will continue to monitor patient's blood sugar levels. Continue with POC.    Creig Hines, Susa Raring

## 2015-05-24 NOTE — Evaluation (Signed)
Physical Therapy Assessment and Plan  Patient Details  Name: [name] MRN: [id number] Date of Birth: 1961/03/18  PT Diagnosis: Abnormal posture, Abnormality of gait, Difficulty walking, Impaired sensation, Muscle weakness and Pain in RLE residual limb Rehab Potential: Good ELOS: 14-18 days   Today's Date: 05/24/2015 PT Individual Time: 0800-0900 PT Individual Time Calculation (min): 60 min    Problem List:  Patient Active Problem List   Diagnosis Date Noted  . Status post below knee amputation of right lower extremity ([name]) 05/23/2015  . Amputation of right lower extremity below knee ([name]) 05/23/2015  . Chronic osteomyelitis ([name]) 05/20/2015  . Right foot ulcer ([name]) 05/20/2015  . DM (diabetes mellitus) type 2, uncontrolled 05/20/2015  . Systolic dysfunction with acute on chronic heart failure ([name]) 01/25/2015  . Bladder cancer ([name])   . Non-ischemic cardiomyopathy ([name])   . Hematuria 10/14/2014  . Bladder tumor 10/14/2014  . Cellulitis   . Atrial fibrillation with RVR ([place])   . LBBB (left bundle [name] block) 10/02/2013  . Anasarca 03/25/2012  . HLD (hyperlipidemia) 03/09/2012  . Lower extremity edema 12/30/2011  . Chronic systolic dysfunction of left ventricle 07/29/2011  . Chronic anticoagulation 12/10/2010  . VENOUS INSUFFICIENCY, LEGS 05/17/2009  . OBESITY-MORBID (>100') 05/06/2009  . Essential hypertension 05/06/2009  . CARDIOMYOPATHY, SECONDARY 05/06/2009  . Atrial flutter ([place]) 05/06/2009    Past Medical History:  Past Medical History  Diagnosis Date  . Hypertension   . Morbid obesity ([place])   . Atrial flutter ([place])  10/01/2006, 12/30/11    Status post RF ablation, 7/13, [name]  . OSA (obstructive sleep apnea)     compliant with CPAP  . Nonischemic cardiomyopathy ([place])     Normal coronary arteries, 2008  . Chronic systolic dysfunction of left ventricle     EF 20-25%, global HK; mild RVD, 2-D echo, 5/13  . Unspecified disorder of kidney and ureter    . RBBB, anterior fascicular block and incomplete LBBB   . Pulmonary hypertension ([facility])     RVSP 50-55 mmHg, 5/13  . Valvular heart disease     Mild MR/TR, 2-D echo, 5/13  . Type II or unspecified type diabetes mellitus without mention of complication, not stated as uncontrolled    Past Surgical History:  Past Surgical History  Procedure Laterality Date  . Bi-ventricular implantable cardioverter defibrillator  (crt-d)  10/27/2013    MDT [name] XT CRTD implanted by Dr Rayann [name] for NICM, CHF  . Atrial flutter ablation N/A 03/04/2012    Procedure: ATRIAL FLUTTER ABLATION;  Surgeon: Carl Grayer, MD;  Location: [name];  Service: Cardiovascular;  Laterality: N/A;  . Bi-ventricular implantable cardioverter defibrillator N/A 10/27/2013    Procedure: BI-VENTRICULAR IMPLANTABLE CARDIOVERTER DEFIBRILLATOR  (CRT-D);  Surgeon: Carl Mark, MD;  Location: [name];  Service: Cardiovascular;  Laterality: N/A;  . Cystoscopy/retrograde/ureteroscopy Bilateral 10/16/2014    Procedure: [name] WITH BILATERAL [name] WITH [name];  Surgeon: Carl So, MD;  Location: [name];  Service: Urology;  Laterality: Bilateral;  . Transurethral resection of bladder tumor with [name] (turbt-[name]) N/A 10/16/2014    Procedure: TRANSURETHRAL RESECTION OF BLADDER TUMOR WITH [name] (TURBT-[name]);  Surgeon: Carl So, MD;  Location: [name];  Service: Urology;  Laterality: N/A;  . Cataract extraction w/phaco Right 03/25/2015    Procedure: CATARACT EXTRACTION PHACO AND INTRAOCULAR LENS PLACEMENT RIGHT EYE CDE=12.24;  Surgeon: Carl Branch, MD;  Location: [name];  Service: Ophthalmology;  Laterality: Right;  . Amputation Right 05/21/2015  Procedure: AMPUTATION BELOW KNEE;  Surgeon: Carl Minion, MD;  Location: [place];  Service: Orthopedics;  Laterality: Right;    Assessment & Plan Clinical Impression: [name] is a 54 y.o. right handed male with history of hypertension, atrial flutter with ablation maintained  on Pradaxa, nonischemic cardiomyopathy, chronic systolic congestive heart failure, diabetes mellitus and peripheral neuropathy, chronic renal insufficiency with baseline creatinine 1.58. Patient lives alone in [place]. Used a cane and walker prior to admission. One level home 2 steps to entry. Admitted 05/21/2015 with chronic right ankle foot ulcer followed by Dr. Sharol [name]. Noted over the past 3 weeks with increasing foul-smelling odor and drainage from wound. X-rays of right foot showed some of the osseous findings secondary to possible Charcot arthropathy although osteomyelitis was a concern. 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 Sharol [name]. Hospital course pain management and anemia. Patient remained on vancomycin and Zosyn blood cultures at [facility] positive for Proteus. Repeat blood cultures thus far negative. Plan to transition to doxycycline Cipro orally 05/23/2015 2 more weeks and stopped. Acute blood loss anemia 9.0 and monitored. Physical therapy evaluations with recommendations of physical medicine rehabilitation consult . Patient was admitted for a comprehensive rehabilitation program  Patient transferred to CIR on 05/23/2015 .   Patient currently requires max with mobility secondary to muscle weakness, decreased cardiorespiratoy endurance and decreased sitting balance, decreased standing balance, decreased postural control and decreased balance strategies.  Prior to hospitalization, patient was modified independent  with mobility and lived with Alone in a House home.  Home access is 2Stairs to enter.  Patient will benefit from skilled PT intervention to maximize safe functional mobility, minimize fall risk and decrease caregiver burden for planned discharge home with intermittent assist.  Anticipate patient will benefit from follow up [name] at discharge.  PT - End of Session Activity Tolerance: Tolerates 30+ min activity with  multiple rests Endurance Deficit: Yes PT Assessment Rehab Potential (ACUTE/IP ONLY): Good Barriers to Discharge: Decreased caregiver support Barriers to Discharge Comments: Pt's mother can provide supervision, but not 24 hr and pt reports she is very busy and is unable to provide physical assist PT Patient demonstrates impairments in the following area(s): Balance;Pain;Safety;Sensory;Endurance;Motor;Skin Integrity PT Transfers Functional Problem(s): Bed Mobility;Bed to Chair;Car;Furniture PT Locomotion Functional Problem(s): Ambulation;Wheelchair Mobility;Stairs PT Plan PT Intensity: Minimum of 1-2 x/day ,45 to 90 minutes PT Frequency: 5 out of 7 days PT Duration Estimated Length of Stay: 14-18 days PT Treatment/Interventions: Ambulation/gait training;Balance/vestibular training;Community reintegration;Discharge planning;Disease management/prevention;DME/adaptive equipment instruction;Functional mobility training;Neuromuscular re-education;Patient/family education;Pain management;Psychosocial support;Stair training;Therapeutic Exercise;UE/LE Coordination activities;Wheelchair propulsion/positioning;UE/LE Strength taining/ROM PT Transfers Anticipated Outcome(s): mod I PT Locomotion Anticipated Outcome(s): mod I short household distances with RW, w/c in community supervision PT Recommendation Recommendations for Other Services: Neuropsych consult Follow Up Recommendations: Home health PT Patient destination: Home Equipment Recommended: To be determined Equipment Details: pt has standard walker, small base quad cane  Skilled Therapeutic Intervention Pt received supine in bed; no c/o pain in resting position and agreeable to treatment. Pt does report some RLE residual limb pain with movement when knee is flexed however does not rate. Initial PT evaluation performed and completed. Performed all mobility as described below. Pt requires mod/maxA for all mobility due to strength deficits, generalized  weakness BUEs increasing difficulty with gait pattern using RW, as well as pt appearing fearful and anxious with mobility. Pt educated in w/c pushups with BUEs to strengthen UEs for carryover into gait  with RW; performed 1 set 5 reps. Pt educated in role of therapy, daily schedule, fall risk and use of call bell [name] to request assistance for getting up. Pt remained seated in w/c at completion of session with all needs in reach.   PT Evaluation Precautions/Restrictions Precautions Precautions: Fall Restrictions Weight Bearing Restrictions: Yes RLE Weight Bearing: Non weight bearing General Chart Reviewed: Yes Response to Previous Treatment: Not applicable Family/Caregiver Present: No Vital Signs  Pain Pain Assessment Pain Assessment: 0-10 Pain Score: 2  Pain Type: Surgical pain Pain Location: Leg Pain Orientation: Right Home Living/Prior Functioning Home Living Living Arrangements: Alone Available Help at Discharge: Family;Available PRN/intermittently Type of Home: House Home Access: Stairs to enter [employer] of Steps: 2 Entrance Stairs-Rails: None Home Layout: One level Bathroom Shower/Tub: Walk-in Multimedia programmer:  (unsure; pt states its "pretty tight" and before admission would leave RW outside the door and walk in) Additional Comments: Stairs to enter home is one large 6-8 inch step, landing, then smaller 3-4 inch threshold  Lives With: Alone Prior Function Level of Independence: Independent with homemaking with ambulation;Independent with basic ADLs;Independent with gait;Independent with transfers;Requires assistive device for independence (used RW and SBQC)  Able to Take Stairs?: Yes Driving: Yes Vocation: On disability Comments: enjoys relaxing around the house, running errands Vision/Perception  Perception Comments: WFL  Cognition Overall Cognitive Status: Within Functional Limits for tasks  assessed Arousal/Alertness: Awake/alert Orientation Level: Oriented X4 Attention: Selective Selective Attention: Appears intact Memory: Appears intact Safety/Judgment: Appears intact Sensation Sensation Light Touch: Impaired Detail Light Touch Impaired Details: Impaired LLE Stereognosis: Not tested Hot/Cold: Not tested Proprioception: Impaired Detail Proprioception Impaired Details: Impaired LLE Coordination Gross Motor Movements are Fluid and Coordinated: No Heel Shin Test: NT d/t amputation Motor  Motor Motor: Abnormal postural alignment and control Motor - Skilled Clinical Observations: generalized weakness, hesitancy and slow movements due to weakness and fear/anxiety  Mobility Bed Mobility Bed Mobility: Rolling Right;Rolling Left;Supine to Sit Rolling Right: 5: Supervision Rolling Right Details: Verbal cues for sequencing;Verbal cues for technique Rolling Right Details (indicate cue type and reason): using rails Rolling Left: 5: Supervision Rolling Left Details: Verbal cues for sequencing;Verbal cues for technique Rolling Left Details (indicate cue type and reason): with rails Supine to Sit: 4: Min guard;With rails;HOB flat (increased time, several tries before able to reach sitting position) Transfers Transfers: Yes Sit to Stand: 2: Max assist;From bed;From elevated surface Sit to Stand Details: Verbal cues for sequencing;Verbal cues for safe use of DME/AE;Verbal cues for technique Sit to Stand Details (indicate cue type and reason): cues for hand placement, elevated bed due to LLE strength deficits Locomotion  Ambulation Ambulation: Yes Ambulation/Gait Assistance: 1: +2 Total assist;2: Max assist Ambulation Distance (Feet): 10 Feet Assistive device: Rolling walker Ambulation/Gait Assistance Details: Verbal cues for technique;Verbal cues for gait pattern;Verbal cues for precautions/safety;Verbal cues for safe use of DME/AE;Tactile cues for placement;Tactile cues for  posture Ambulation/Gait Assistance Details: cues for upright posture, progression of RW, cues to lower shoulders and push through arms to improve LLE clearance and decrease heavy landing on LLE Gait Gait: Yes Gait Pattern: Impaired Gait Pattern: Trunk flexed;Poor foot clearance - left Gait velocity: decreased for age/gender norms Stairs / Additional Locomotion Stairs: No Wheelchair Mobility Wheelchair Mobility: Yes Wheelchair Assistance: 4: Audiological scientist Details: Verbal cues for sequencing;Verbal cues for technique;Verbal cues for safe use of DME/AE Wheelchair Propulsion: Both upper extremities Wheelchair Parts Management: Needs assistance Distance: 150  Trunk/Postural Assessment  Cervical  Assessment Cervical Assessment: Exceptions to [facility] (forward head posture) Thoracic Assessment Thoracic Assessment: Exceptions to [facility] (increased thoracic kyphosis) Lumbar Assessment Lumbar Assessment: Exceptions to [facility] (reversal of lumbar lordosis, posterior pelvic tilt) Postural Control Postural Control: Deficits on evaluation (decreased righting reactions in sitting/standing due to generalized weakness)  Balance Balance Balance Assessed: Yes Static Sitting Balance Static Sitting - Balance Support: Feet supported;No upper extremity supported Static Sitting - Level of Assistance: 5: Stand by assistance Static Sitting - Comment/# of Minutes: x3 min EOB while taking medication Dynamic Sitting Balance Dynamic Sitting - Balance Support: Feet supported;No upper extremity supported Dynamic Sitting - Level of Assistance: 4: Min assist Sitting balance - Comments: mod LOB posteriorly while performing LE MMT seated on EOB, required minA and use of BUEs to recover Static Standing Balance Static Standing - Level of Assistance: 3: Mod assist Static Standing - Comment/# of Minutes: x2 min  Dynamic Standing Balance Dynamic Standing - Level of Assistance: 2: Max assist Extremity Assessment   RUE Assessment RUE Assessment: Exceptions to [facility] (generalized weakness, grossly 4-/5 to 4/5 throughout) LUE Assessment LUE Assessment: Exceptions to [facility] (generalized weakness, grossly 4-/5 to 4/5 throughout) RLE Assessment RLE Assessment: Exceptions to [facility] (knee flexion ROM limited due to pain, strength hip flexion 4-/5, knee grossly 4-/5) LLE Assessment LLE Assessment: Exceptions to [facility] (grossly 4/5 to 4+/5 throughout)   See Function Navigator for Current Functional Status.   Refer to Care Plan for Long Term Goals  Recommendations for other services: Neuropsych  Discharge Criteria: Patient will be discharged from PT if patient refuses treatment 3 consecutive times without medical reason, if treatment goals not met, if there is a change in medical status, if patient makes no progress towards goals or if patient is discharged from hospital.  The above assessment, treatment plan, treatment alternatives and goals were discussed and mutually agreed upon: by patient  Luberta Mutter 05/24/2015, 11:52 AM

## 2015-05-24 NOTE — Progress Notes (Signed)
Physical Therapy Note  Patient Details  Name: [name] MRN: [id number] Date of Birth: 1961/02/25 Today's Date: 05/24/2015    Time: 1330-1410 40 minutes  1:1 Pt c/o pain in residual limb with knee flexion, positioned for comfort.  Sit to stand repetitions in parallel bars, initially max assist, progressed to min A with multiple attempts.  W/c push ups for UE strengthening 3 x 3.  W/c mobility throughout unit for increased activity tolerance on carpet and tile floor with supervision, multiple rest breaks.  Pt with good motivation to participate in treatment.   Jonluke Cobbins 05/24/2015, 2:13 PM

## 2015-05-24 NOTE — Significant Event (Signed)
Hypoglycemic Event  CBG: 61  Treatment: 15 GM carbohydrate snack  Symptoms: None  Follow-up CBG: Time:1638 CBG Result:60  Possible Reasons for Event: Unknown  Comments/MD notified: 15 gram snack given and patient verbalized no s/s. Will check again in 15 minutes.    Creig Hines, Susa Raring

## 2015-05-24 NOTE — Progress Notes (Signed)
Patient information reviewed and entered into eRehab system by Siddhanth Denk, RN, CRRN, PPS Coordinator.  Information including medical coding and functional independence measure will be reviewed and updated through discharge.     Per nursing patient was given "Data Collection Information Summary for Patients in Inpatient Rehabilitation Facilities with attached "Privacy Act Statement-Health Care Records" upon admission.  

## 2015-05-24 NOTE — Evaluation (Signed)
Occupational Therapy Assessment and Plan  Patient Details  Name: [name] MRN: [id number] Date of Birth: 11-11-1960  OT Diagnosis: acute pain and muscle weakness (generalized) Rehab Potential: Rehab Potential (ACUTE ONLY): Fair ELOS: 16-18 days   Today's Date: 05/24/2015 OT Individual Time: 6144-3154 OT Individual Time Calculation (min): 57 min     Problem List:  Patient Active Problem List   Diagnosis Date Noted  . Status post below knee amputation of right lower extremity ([place]) 05/23/2015  . Amputation of right lower extremity below knee ([place]) 05/23/2015  . Chronic osteomyelitis ([place]) 05/20/2015  . Right foot ulcer ([place]) 05/20/2015  . DM (diabetes mellitus) type 2, uncontrolled 05/20/2015  . Systolic dysfunction with acute on chronic heart failure ([place]) 01/25/2015  . Bladder cancer ([place])   . Non-ischemic cardiomyopathy ([place])   . Hematuria 10/14/2014  . Bladder tumor 10/14/2014  . Cellulitis   . Atrial fibrillation with RVR ([place])   . LBBB (left bundle branch block) 10/02/2013  . Anasarca 03/25/2012  . HLD (hyperlipidemia) 03/09/2012  . Lower extremity edema 12/30/2011  . Chronic systolic dysfunction of left ventricle 07/29/2011  . Chronic anticoagulation 12/10/2010  . VENOUS INSUFFICIENCY, LEGS 05/17/2009  . OBESITY-MORBID (>100') 05/06/2009  . Essential hypertension 05/06/2009  . CARDIOMYOPATHY, SECONDARY 05/06/2009  . Atrial flutter ([place]) 05/06/2009    Past Medical History:  Past Medical History  Diagnosis Date  . Hypertension   . Morbid obesity ([place])   . Atrial flutter ([place])  10/01/2006, 12/30/11    Status post RF ablation, 7/13, Dr. [name]  . OSA (obstructive sleep apnea)     compliant with CPAP  . Nonischemic cardiomyopathy ([place])     Normal coronary arteries, 2008  . Chronic systolic dysfunction of left ventricle     EF 20-25%, global HK; mild RVD, 2-D echo, 5/13  . Unspecified disorder of kidney and ureter   . RBBB, anterior fascicular block and  incomplete LBBB   . Pulmonary hypertension ([facility])     RVSP 50-55 mmHg, 5/13  . Valvular heart disease     Mild MR/TR, 2-D echo, 5/13  . Type II or unspecified type diabetes mellitus without mention of complication, not stated as uncontrolled    Past Surgical History:  Past Surgical History  Procedure Laterality Date  . Bi-ventricular implantable cardioverter defibrillator  (crt-d)  10/27/2013    MDT [name] XT CRTD implanted by [name] for NICM, CHF  . Atrial flutter ablation N/A 03/04/2012    Procedure: ATRIAL FLUTTER ABLATION;  Surgeon: Thompson Grayer, MD;  Location: [facility] CATH LAB;  Service: Cardiovascular;  Laterality: N/A;  . Bi-ventricular implantable cardioverter defibrillator N/A 10/27/2013    Procedure: BI-VENTRICULAR IMPLANTABLE CARDIOVERTER DEFIBRILLATOR  (CRT-D);  Surgeon: Coralyn Mark, MD;  Location: [facility] CATH LAB;  Service: Cardiovascular;  Laterality: N/A;  . Cystoscopy/retrograde/ureteroscopy Bilateral 10/16/2014    Procedure: Kathrene Alu WITH BILATERAL Freddie Breech WITH GYRUS;  Surgeon: Malka So, MD;  Location: [facility] ORS;  Service: Urology;  Laterality: Bilateral;  . Transurethral resection of bladder tumor with gyrus (turbt-gyrus) N/A 10/16/2014    Procedure: TRANSURETHRAL RESECTION OF BLADDER TUMOR WITH GYRUS (TURBT-GYRUS);  Surgeon: Malka So, MD;  Location: [facility] ORS;  Service: Urology;  Laterality: N/A;  . Cataract extraction w/phaco Right 03/25/2015    Procedure: CATARACT EXTRACTION PHACO AND INTRAOCULAR LENS PLACEMENT RIGHT EYE CDE=12.24;  Surgeon: Tonny Branch, MD;  Location: [facility] ORS;  Service: Ophthalmology;  Laterality: Right;  . Amputation Right 05/21/2015    Procedure: AMPUTATION BELOW KNEE;  Surgeon: Newt Minion, MD;  Location: [facility];  Service: Orthopedics;  Laterality: Right;    Assessment & Plan Clinical Impression: Patient is a 54 y.o. right handed male with history of hypertension, atrial flutter with ablation maintained on Pradaxa, nonischemic cardiomyopathy, chronic  systolic congestive heart failure, diabetes mellitus and peripheral neuropathy, chronic renal insufficiency with baseline creatinine 1.58. Patient lives alone in [place]. Used a cane and walker prior to admission. One level home 2 steps to entry. Admitted 05/21/2015 with chronic right ankle foot ulcer followed by Dr. Sharol Given. Noted over the past 3 weeks with increasing foul-smelling odor and drainage from wound. X-rays of right foot showed some of the osseous findings secondary to possible Charcot arthropathy although osteomyelitis was a concern. 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 Sharol Given. Hospital course pain management and anemia. Patient remained on vancomycin and Zosyn blood cultures at [facility] positive for Proteus. Repeat blood cultures thus far negative. Plan to transition to doxycycline Cipro orally 05/23/2015 2 more weeks and stopped. Acute blood loss anemia 9.0 and monitored. Physical therapy evaluations with recommendations of physical medicine rehabilitation consult.   Patient transferred to CIR on 05/23/2015 .    Patient currently requires max with basic self-care skills secondary to muscle weakness and decreased standing balance, decreased postural control, decreased balance strategies and post amputation.  Prior to hospitalization, patient could complete ADLs with modified independent .  Patient will benefit from skilled intervention to increase independence with basic self-care skills and increase level of independence with iADL prior to discharge home independently, mother can provide intermittent assist.  Anticipate patient will require intermittent supervision and follow up home health.  OT - End of Session Activity Tolerance: Tolerates 30+ min activity with multiple rests Endurance Deficit: Yes Endurance Deficit Description: requires multiple rest breaks OT Assessment Rehab Potential (ACUTE ONLY): Fair OT  Patient demonstrates impairments in the following area(s): Balance;Endurance;Motor;Safety OT Basic ADL's Functional Problem(s): Grooming;Bathing;Dressing;Toileting OT Advanced ADL's Functional Problem(s): Simple Meal Preparation;Laundry OT Transfers Functional Problem(s): Toilet;Tub/Shower OT Additional Impairment(s): None OT Plan OT Intensity: Minimum of 1-2 x/day, 45 to 90 minutes OT Frequency: 5 out of 7 days OT Duration/Estimated Length of Stay: 16-18 days OT Treatment/Interventions: Medical illustrator training;Community reintegration;Discharge planning;Disease mangement/prevention;DME/adaptive equipment instruction;Functional mobility training;Pain management;Patient/family education;Psychosocial support;Self Care/advanced ADL retraining;Skin care/wound managment;Splinting/orthotics;Therapeutic Activities;Therapeutic Exercise;UE/LE Strength taining/ROM;Wheelchair propulsion/positioning OT Self Feeding Anticipated Outcome(s): No goal OT Basic Self-Care Anticipated Outcome(s): Mod I - supervision OT Toileting Anticipated Outcome(s): Mod I OT Bathroom Transfers Anticipated Outcome(s): Mod I - supervision OT Recommendation Patient destination: Home Follow Up Recommendations: Home health OT Equipment Recommended: 3 in 1 bedside comode;Tub/shower seat;To be determined   Skilled Therapeutic Intervention OT eval completed with discussion of rehab process, OT purpose, POC, ELOS, and goals.  ADL assessment completed with bathing at sink.  Pt required max assist sit > stand for perineal hygiene due to BUE and LE weakness, and deconditioning.  Engaged in sit > stand x4 throughout session with consistent mod-max assist with UE support.  Educated on limb inspection due to peripheral neuropathy and post amputation.  OT Evaluation Precautions/Restrictions  Precautions Precautions: Fall Restrictions Weight Bearing Restrictions: Yes RLE Weight Bearing: Non weight bearing Pain Pain Assessment Pain  Assessment: 0-10 Pain Score: 2  Pain Type: Surgical pain Pain Location: Leg Pain Orientation: Right Home Living/Prior Functioning Home Living Living Arrangements: Alone Available Help at Discharge: Family, Available PRN/intermittently Type of Home: House Home Access: Stairs to enter  Entrance Stairs-Number of Steps: 2 Entrance Stairs-Rails: None Home Layout: One level Bathroom Shower/Tub: Gaffer, Architectural technologist: Programmer, systems:  (unsure; pt states its "pretty tight" and before admission would leave RW outside the door and walk in) Additional Comments: Stairs to enter home is one large 6-8 inch step, landing, then smaller 3-4 inch threshold  Lives With: Alone IADL History Homemaking Responsibilities: Yes Meal Prep Responsibility: Primary Laundry Responsibility: Primary Cleaning Responsibility: Primary Bill Paying/Finance Responsibility: Primary Shopping Responsibility: Primary Current License: Yes Mode of Transportation: Car Occupation: On disability Prior Function Level of Independence: Independent with homemaking with ambulation, Independent with basic ADLs, Independent with gait, Independent with transfers, Requires assistive device for independence (used RW and SBQC)  Able to Take Stairs?: Yes Driving: Yes Vocation: On disability Comments: enjoys relaxing around the house, running errands ADL  See Function Navigator Vision/Perception  Vision- History Baseline Vision/History: No visual deficits Patient Visual Report: No change from baseline Vision- Assessment Vision Assessment?: No apparent visual deficits Perception Comments: WFL  Cognition Overall Cognitive Status: Within Functional Limits for tasks assessed Arousal/Alertness: Awake/alert Orientation Level: Person;Place;Situation Person: Oriented Place: Oriented Situation: Oriented Year: 2016 Month: October Day of Week: Correct Memory: Appears intact Immediate Memory Recall:  Sock;Blue;Bed Memory Recall: Sock;Blue;Bed Memory Recall Sock: Without Cue Memory Recall Blue: Without Cue Memory Recall Bed: Without Cue Attention: Selective Selective Attention: Appears intact Safety/Judgment: Appears intact Sensation Sensation Light Touch: Impaired Detail Light Touch Impaired Details: Impaired LLE Stereognosis: Not tested Hot/Cold: Not tested Proprioception: Impaired Detail Proprioception Impaired Details: Impaired LLE Coordination Gross Motor Movements are Fluid and Coordinated: No Fine Motor Movements are Fluid and Coordinated: Yes Finger Nose Finger Test: WNL Heel Shin Test: NT d/t amputation Motor  Motor Motor: Abnormal postural alignment and control Motor - Skilled Clinical Observations: generalized weakness, hesitancy and slow movements due to weakness and fear/anxiety Mobility  Bed Mobility Bed Mobility: Rolling Right;Rolling Left;Supine to Sit Rolling Right: 5: Supervision Rolling Right Details: Verbal cues for sequencing;Verbal cues for technique Rolling Right Details (indicate cue type and reason): using rails Rolling Left: 5: Supervision Rolling Left Details: Verbal cues for sequencing;Verbal cues for technique Rolling Left Details (indicate cue type and reason): with rails Supine to Sit: 4: Min guard;With rails;HOB flat (increased time, several tries before able to reach sitting position) Transfers Sit to Stand: 2: Max assist;From bed;From elevated surface Sit to Stand Details: Verbal cues for sequencing;Verbal cues for safe use of DME/AE;Verbal cues for technique Sit to Stand Details (indicate cue type and reason): cues for hand placement, elevated bed due to LLE strength deficits  Trunk/Postural Assessment  Cervical Assessment Cervical Assessment: Exceptions to [facility] (forward head posture) Thoracic Assessment Thoracic Assessment: Exceptions to [facility] (increased thoracic kyphosis) Lumbar Assessment Lumbar Assessment: Exceptions to [facility] (reversal  of lumbar lordosis, posterior pelvic tilt) Postural Control Postural Control: Deficits on evaluation (decreased righting reactions in sitting/standing due to generalized weakness)  Balance Balance Balance Assessed: Yes Static Sitting Balance Static Sitting - Balance Support: Feet supported;No upper extremity supported Static Sitting - Level of Assistance: 5: Stand by assistance Static Sitting - Comment/# of Minutes: x3 min EOB while taking medication Dynamic Sitting Balance Dynamic Sitting - Balance Support: Feet supported;No upper extremity supported Dynamic Sitting - Level of Assistance: 4: Min assist Sitting balance - Comments: mod LOB posteriorly while performing LE MMT seated on EOB, required minA and use of BUEs to recover Static Standing Balance Static Standing - Level of Assistance: 3: Mod assist Static Standing - Comment/# of  Minutes: x2 min  Dynamic Standing Balance Dynamic Standing - Level of Assistance: 2: Max assist Extremity/Trunk Assessment RUE Assessment RUE Assessment: Exceptions to [facility] (generalized weakness, grossly 4-/5 to 4/5 throughout) LUE Assessment LUE Assessment: Exceptions to [facility] (generalized weakness, grossly 4-/5 to 4/5 throughout)   See Function Navigator for Current Functional Status.   Refer to Care Plan for Long Term Goals  Recommendations for other services: Neuropsych  Discharge Criteria: Patient will be discharged from OT if patient refuses treatment 3 consecutive times without medical reason, if treatment goals not met, if there is a change in medical status, if patient makes no progress towards goals or if patient is discharged from hospital.  The above assessment, treatment plan, treatment alternatives and goals were discussed and mutually agreed upon: by patient  Simonne Come 05/24/2015, 12:23 PM

## 2015-05-24 NOTE — H&P (View-Only) (Signed)
Physical Medicine and Rehabilitation Admission H&P   Chief complaint: Stump pain  HPI: [name] is a 54 y.o. right handed male with history of hypertension, atrial flutter with ablation maintained on Pradaxa, nonischemic cardiomyopathy, chronic systolic congestive heart failure, diabetes mellitus and peripheral neuropathy, chronic renal insufficiency with baseline creatinine 1.58. Patient lives alone in [place]. Used a cane and walker prior to admission. One level home 2 steps to entry. Admitted 05/21/2015 with chronic right ankle foot ulcer followed by Dr. Sharol Given. Noted over the past 3 weeks with increasing foul-smelling odor and drainage from wound. X-rays of right foot showed some of the osseous findings secondary to possible Charcot arthropathy although osteomyelitis was a concern. 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 Sharol Given. Hospital course pain management and anemia. Patient remained on vancomycin and Zosyn blood cultures at [facility] positive for Proteus. Repeat blood cultures thus far negative. Plan to transition to doxycycline Cipro orally 05/23/2015 2 more weeks and stopped. Acute blood loss anemia 9.0 and monitored. Physical therapy evaluations with recommendations of physical medicine rehabilitation consult . Patient was admitted for a comprehensive rehabilitation program  Patient states that he lives alone but his mother lives 1-1/2 miles from him, his cousin could potentially help as well  ROS Review of Systems  Constitutional: Negative for chills.  HENT: Negative for hearing loss.  Eyes: Negative for blurred vision and double vision.  Respiratory: Negative for cough.   Shortness of breath with exertion  Gastrointestinal: Negative for nausea and vomiting.  Genitourinary: Negative for dysuria and hematuria.  Musculoskeletal: Positive for myalgias.  Skin: Negative for rash.   Neurological: Negative for seizures, loss of consciousness and headaches.  All other systems reviewed and are negative   Past Medical History  Diagnosis Date  . Hypertension   . Morbid obesity ([place])   . Atrial flutter ([place]) 10/01/2006, 12/30/11    Status post RF ablation, 7/13, Dr. [name]  . OSA (obstructive sleep apnea)     compliant with CPAP  . Nonischemic cardiomyopathy ([place])     Normal coronary arteries, 2008  . Chronic systolic dysfunction of left ventricle     EF 20-25%, global HK; mild RVD, 2-D echo, 5/13  . Unspecified disorder of kidney and ureter   . RBBB, anterior fascicular block and incomplete LBBB   . Pulmonary hypertension (H[facility])     RVSP 50-55 mmHg, 5/13  . Valvular heart disease     Mild MR/TR, 2-D echo, 5/13  . Type II or unspecified type diabetes mellitus without mention of complication, not stated as uncontrolled    Past Surgical History  Procedure Laterality Date  . Bi-ventricular implantable cardioverter defibrillator (crt-d)  10/27/2013    MDT [name] XT CRTD implanted by [name] for NICM, CHF  . Atrial flutter ablation N/A 03/04/2012    Procedure: ATRIAL FLUTTER ABLATION; Surgeon: Thompson Grayer, MD; Location: [facility] CATH LAB; Service: Cardiovascular; Laterality: N/A;  . Bi-ventricular implantable cardioverter defibrillator N/A 10/27/2013    Procedure: BI-VENTRICULAR IMPLANTABLE CARDIOVERTER DEFIBRILLATOR (CRT-D); Surgeon: Coralyn Mark, MD; Location: [facility] CATH LAB; Service: Cardiovascular; Laterality: N/A;  . Cystoscopy/retrograde/ureteroscopy Bilateral 10/16/2014    Procedure: Kathrene Alu WITH BILATERAL Freddie Breech WITH GYRUS; Surgeon: Malka So, MD; Location: [facility] ORS; Service: Urology; Laterality: Bilateral;  . Transurethral resection of bladder tumor with gyrus (turbt-gyrus) N/A 10/16/2014    Procedure: TRANSURETHRAL RESECTION OF BLADDER TUMOR WITH  GYRUS (TURBT-GYRUS); Surgeon: Malka So, MD; Location: [facility]  ORS; Service: Urology; Laterality: N/A;  . Cataract extraction w/phaco Right 03/25/2015    Procedure: CATARACT EXTRACTION PHACO AND INTRAOCULAR LENS PLACEMENT RIGHT EYE CDE=12.24; Surgeon: Tonny Branch, MD; Location: [facility] ORS; Service: Ophthalmology; Laterality: Right;  . Amputation Right 05/21/2015    Procedure: AMPUTATION BELOW KNEE; Surgeon: Newt Minion, MD; Location: [place]; Service: Orthopedics; Laterality: Right;   Family History  Problem Relation Age of Onset  . Asthma Father 10    DECEASED  . Diabetes     Social History:  reports that he has never smoked. He has never used smokeless tobacco. He reports that he does not drink alcohol or use illicit drugs. Allergies: No Known Allergies Medications Prior to Admission  Medication Sig Dispense Refill  . AFLURIA PRESERVATIVE FREE 0.5 ML SUSY Inject 0.5 mLs into the muscle once.   0  . allopurinol (ZYLOPRIM) 300 MG tablet Take 300 mg by mouth daily.  5  . atorvastatin (LIPITOR) 10 MG tablet Take 10 mg by mouth every morning.     . Canagliflozin (INVOKANA) 300 MG TABS Take 300 mg by mouth every evening.     . cloNIDine (CATAPRES) 0.2 MG tablet Take 0.2 mg by mouth 2 (two) times daily.     [name] COLCRYS 0.6 MG tablet Take 0.6 mg by mouth daily as needed (gout).     . dabigatran (PRADAXA) 150 MG CAPS capsule Take 1 capsule (150 mg total) by mouth 2 (two) times daily. 60 capsule 6  . digoxin (LANOXIN) 0.25 MG tablet Take 0.125 mg by mouth daily.    [name] gemfibrozil (LOPID) 600 MG tablet Take 600 mg by mouth 2 (two) times daily before a meal.     . insulin detemir (LEVEMIR) 100 UNIT/ML injection Inject 0.5 mLs (50 Units total) into the skin 2 (two) times daily. 10 mL 1  . metoprolol (LOPRESSOR) 50 MG tablet Take 1.5 tablets (75 mg total) by mouth 2 (two) times daily. 180 tablet 3  . NOVOLOG  FLEXPEN 100 UNIT/ML FlexPen Inject 25-36 Units into the skin 3 (three) times daily with meals. Takes 25 units in am, 25 units at lunch and 36 units in pm  5  . nystatin ointment (MYCOSTATIN) Apply 1 application topically daily as needed (for fungal infection). For fungal irritation  5  . prazosin (MINIPRESS) 1 MG capsule TAKE ONE CAPSULE BY MOUTH TWICE DAILY 60 capsule 6  . sitaGLIPtin (JANUVIA) 100 MG tablet Take 100 mg by mouth daily.    [name] spironolactone (ALDACTONE) 25 MG tablet Take 0.5 tablets (12.5 mg total) by mouth daily. 45 tablet 3  . torsemide (DEMADEX) 20 MG tablet Take 1 tablet (20 mg total) by mouth as directed. Starting today 03/06/15 TAKE TWO TABLETS (40 MG) TOTAL IN THE AM AND ONE TABLET (20 MG) TOTAL IN THE EVENING (Patient taking differently: Take 20-40 mg by mouth 2 (two) times daily. Starting today 03/06/15 TAKE TWO TABLETS (40 MG) TOTAL IN THE AM AND ONE TABLET (20 MG) TOTAL IN THE EVENING) 90 tablet 6  . valsartan (DIOVAN) 80 MG tablet Take 1 tablet (80 mg total) by mouth daily. 90 tablet 3  . allopurinol (ZYLOPRIM) 100 MG tablet Take 1 tablet (100 mg total) by mouth daily. 30 tablet 1    Home: Home Living Family/patient expects to be discharged to:: Private residence Living Arrangements: Alone Available Help at Discharge: Family, Available PRN/intermittently Type of Home: House Home Access: Stairs to enter [employer] of Steps: 2 Entrance Stairs-Rails: None Home Layout: One  level Home Equipment: None  Functional History: Prior Function Level of Independence: Independent  Functional Status:  Mobility: Bed Mobility Overal bed mobility: Needs Assistance Bed Mobility: Supine to Sit Supine to sit: HOB elevated, Mod assist General bed mobility comments: mod handheld assist to pull to sit once LEs cleared EOB; trunk girth somewhat limiting anterior weight shift and ability to pull forward to sit  up Transfers Overall transfer level: Needs assistance Equipment used: Rolling walker (2 wheeled) Transfers: Sit to/from Stand, [employer] Transfers Sit to Stand: Mod assist Stand pivot transfers: Mod assist General transfer comment: Cues for technqiue, safety and hand placement; mod assist to rise; demo cues to "heel-toe pivot" on L eft foot; minimal hopping to chair with rW      ADL:    Cognition: Cognition Overall Cognitive Status: Within Functional Limits for tasks assessed Orientation Level: Oriented X4 Cognition Arousal/Alertness: Awake/alert Behavior During Therapy: WFL for tasks assessed/performed Overall Cognitive Status: Within Functional Limits for tasks assessed  Physical Exam: Blood pressure 134/75, pulse 65, temperature 98.1 F (36.7 C), temperature source Oral, resp. rate 18, height _0  (1.803 m), weight 113.626 kg (250 lb 8 oz), SpO2 99 %. Physical Exam Constitutional: He is oriented to person, place, and time. He appears well-developed and well-nourished.  HENT:  Head: Normocephalic and atraumatic.  Eyes: Conjunctivae and EOM are normal.  Neck: Normal range of motion. Neck supple. No thyromegaly present.  Cardiovascular: Normal rate.  Cardiac rate controlled  Respiratory: Effort normal and breath sounds normal. No respiratory distress.  GI: Soft. Bowel sounds are normal. He exhibits no distension.  Musculoskeletal:  Manual muscle testing 5/5 bilateral deltoid, biceps, triceps, grip 4/5 in the left hip flexor and extensor, 3 minus ankle dorsiflexor and plantar flexor 3 minus/5 right hip flexion as well as hip adduction, BKA  Neurological: He is alert and oriented to person, place, and time.  Sensation intact to light touch In both upper extremities, absent to light touch below the knee in the left lower extremity, absent proprioception left great toe, intact proprioception left ankle Skin: Skin is warm and dry.  Right BKA site is dressed  appropriately tender  Psychiatric: He has a normal mood and affect. His behavior is normal    Lab Results Last 48 Hours    Results for orders placed or performed during the hospital encounter of 05/20/15 (from the past 48 hour(s))  Basic metabolic panel Status: Abnormal   Collection Time: 05/21/15 7:21 AM  Result Value Ref Range   Sodium 137 135 - 145 mmol/L   Potassium 3.2 (L) 3.5 - 5.1 mmol/L   Chloride 103 101 - 111 mmol/L   CO2 24 22 - 32 mmol/L   Glucose, Bld 111 (H) 65 - 99 mg/dL   BUN 39 (H) 6 - 20 mg/dL   Creatinine, Ser 1.55 (H) 0.61 - 1.24 mg/dL   Calcium 8.2 (L) 8.9 - 10.3 mg/dL   GFR calc non Af Amer 49 (L) >60 mL/min   GFR calc Af Amer 57 (L) >60 mL/min    Comment: (NOTE) The eGFR has been calculated using the CKD EPI equation. This calculation has not been validated in all clinical situations. eGFR's persistently <60 mL/min signify possible Chronic Kidney Disease.    Anion gap 10 5 - 15  CBC Status: Abnormal   Collection Time: 05/21/15 7:21 AM  Result Value Ref Range   WBC 6.3 4.0 - 10.5 K/uL   RBC 3.49 (L) 4.22 - 5.81 MIL/uL   Hemoglobin  9.1 (L) 13.0 - 17.0 g/dL   HCT 28.5 (L) 39.0 - 52.0 %   MCV 81.7 78.0 - 100.0 fL   MCH 26.1 26.0 - 34.0 pg   MCHC 31.9 30.0 - 36.0 g/dL   RDW 15.5 11.5 - 15.5 %   Platelets 294 150 - 400 K/uL  Magnesium Status: None   Collection Time: 05/21/15 7:21 AM  Result Value Ref Range   Magnesium 1.8 1.7 - 2.4 mg/dL  Glucose, capillary Status: Abnormal   Collection Time: 05/21/15 7:58 AM  Result Value Ref Range   Glucose-Capillary 101 (H) 65 - 99 mg/dL  Glucose, capillary Status: None   Collection Time: 05/21/15 11:16 AM  Result Value Ref Range   Glucose-Capillary 69 65 - 99 mg/dL  Glucose, capillary Status: Abnormal   Collection Time: 05/21/15 12:15 PM  Result Value  Ref Range   Glucose-Capillary 125 (H) 65 - 99 mg/dL  Glucose, capillary Status: Abnormal   Collection Time: 05/21/15 3:12 PM  Result Value Ref Range   Glucose-Capillary 61 (L) 65 - 99 mg/dL  Glucose, capillary Status: None   Collection Time: 05/21/15 3:39 PM  Result Value Ref Range   Glucose-Capillary 87 65 - 99 mg/dL  Type and screen Status: None (Preliminary result)   Collection Time: 05/21/15 4:22 PM  Result Value Ref Range   ABO/RH(D) O POS    Antibody Screen NEG    Sample Expiration 05/24/2015    Unit Number [id number]    Blood Component Type RED CELLS,LR    Unit division 00    Status of Unit ALLOCATED    Transfusion Status OK TO TRANSFUSE    Crossmatch Result Compatible    Unit Number [id number]    Blood Component Type RED CELLS,LR    Unit division 00    Status of Unit ALLOCATED    Transfusion Status OK TO TRANSFUSE    Crossmatch Result Compatible   ABO/Rh Status: None   Collection Time: 05/21/15 4:22 PM  Result Value Ref Range   ABO/RH(D) O POS   Protime-INR Status: Abnormal   Collection Time: 05/21/15 4:26 PM  Result Value Ref Range   Prothrombin Time 22.6 (H) 11.6 - 15.2 seconds   INR 2.01 (H) 0.00 - 1.49  Glucose, capillary Status: None   Collection Time: 05/21/15 5:31 PM  Result Value Ref Range   Glucose-Capillary 71 65 - 99 mg/dL  Glucose, capillary Status: Abnormal   Collection Time: 05/21/15 5:54 PM  Result Value Ref Range   Glucose-Capillary 103 (H) 65 - 99 mg/dL  CBC with Differential/Platelet Status: Abnormal   Collection Time: 05/21/15 6:48 PM  Result Value Ref Range   WBC 7.2 4.0 - 10.5 K/uL   RBC 3.44 (L) 4.22 - 5.81 MIL/uL   Hemoglobin 9.1 (L) 13.0 - 17.0 g/dL   HCT 28.3 (L) 39.0 - 52.0 %   MCV 82.3 78.0 - 100.0 fL   MCH 26.5 26.0 - 34.0  pg   MCHC 32.2 30.0 - 36.0 g/dL   RDW 15.6 (H) 11.5 - 15.5 %   Platelets 346 150 - 400 K/uL   Neutrophils Relative % 68 %   Neutro Abs 4.9 1.7 - 7.7 K/uL   Lymphocytes Relative 21 %   Lymphs Abs 1.5 0.7 - 4.0 K/uL   Monocytes Relative 8 %   Monocytes Absolute 0.6 0.1 - 1.0 K/uL   Eosinophils Relative 2 %   Eosinophils Absolute 0.1 0.0 - 0.7 K/uL   Basophils Relative 1 %   Basophils  Absolute 0.1 0.0 - 0.1 K/uL  Comprehensive metabolic panel Status: Abnormal   Collection Time: 05/21/15 6:48 PM  Result Value Ref Range   Sodium 139 135 - 145 mmol/L   Potassium 3.1 (L) 3.5 - 5.1 mmol/L   Chloride 105 101 - 111 mmol/L   CO2 24 22 - 32 mmol/L   Glucose, Bld 82 65 - 99 mg/dL   BUN 31 (H) 6 - 20 mg/dL   Creatinine, Ser 1.33 (H) 0.61 - 1.24 mg/dL   Calcium 8.3 (L) 8.9 - 10.3 mg/dL   Total Protein 5.8 (L) 6.5 - 8.1 g/dL   Albumin 1.6 (L) 3.5 - 5.0 g/dL   AST 19 15 - 41 U/L   ALT 15 (L) 17 - 63 U/L   Alkaline Phosphatase 159 (H) 38 - 126 U/L   Total Bilirubin 0.6 0.3 - 1.2 mg/dL   GFR calc non Af Amer 59 (L) >60 mL/min   GFR calc Af Amer >60 >60 mL/min    Comment: (NOTE) The eGFR has been calculated using the CKD EPI equation. This calculation has not been validated in all clinical situations. eGFR's persistently <60 mL/min signify possible Chronic Kidney Disease.    Anion gap 10 5 - 15  Prepare RBC (crossmatch) Status: None   Collection Time: 05/21/15 7:35 PM  Result Value Ref Range   Order Confirmation ORDER PROCESSED BY BLOOD BANK   Glucose, capillary Status: None   Collection Time: 05/21/15 8:43 PM  Result Value Ref Range   Glucose-Capillary 82 65 - 99 mg/dL  Glucose, capillary Status: None   Collection Time: 05/21/15 9:29 PM  Result Value Ref Range   Glucose-Capillary 73 65 - 99 mg/dL  CBC  Status: Abnormal   Collection Time: 05/22/15 4:35 AM  Result Value Ref Range   WBC 7.9 4.0 - 10.5 K/uL   RBC 3.35 (L) 4.22 - 5.81 MIL/uL   Hemoglobin 9.0 (L) 13.0 - 17.0 g/dL   HCT 28.0 (L) 39.0 - 52.0 %   MCV 83.6 78.0 - 100.0 fL   MCH 26.9 26.0 - 34.0 pg   MCHC 32.1 30.0 - 36.0 g/dL   RDW 15.6 (H) 11.5 - 15.5 %   Platelets 310 150 - 400 K/uL  Basic metabolic panel Status: Abnormal   Collection Time: 05/22/15 4:35 AM  Result Value Ref Range   Sodium 139 135 - 145 mmol/L   Potassium 4.3 3.5 - 5.1 mmol/L    Comment: DELTA CHECK NOTED   Chloride 104 101 - 111 mmol/L   CO2 28 22 - 32 mmol/L   Glucose, Bld 230 (H) 65 - 99 mg/dL   BUN 29 (H) 6 - 20 mg/dL   Creatinine, Ser 1.49 (H) 0.61 - 1.24 mg/dL   Calcium 8.2 (L) 8.9 - 10.3 mg/dL   GFR calc non Af Amer 51 (L) >60 mL/min   GFR calc Af Amer 59 (L) >60 mL/min    Comment: (NOTE) The eGFR has been calculated using the CKD EPI equation. This calculation has not been validated in all clinical situations. eGFR's persistently <60 mL/min signify possible Chronic Kidney Disease.    Anion gap 7 5 - 15  Glucose, capillary Status: Abnormal   Collection Time: 05/22/15 7:43 AM  Result Value Ref Range   Glucose-Capillary 226 (H) 65 - 99 mg/dL  Glucose, capillary Status: Abnormal   Collection Time: 05/22/15 11:20 AM  Result Value Ref Range   Glucose-Capillary 190 (H) 65 - 99 mg/dL  Glucose, capillary Status: Abnormal  Collection Time: 05/22/15 5:01 PM  Result Value Ref Range   Glucose-Capillary 216 (H) 65 - 99 mg/dL   Comment 1 Notify RN    Comment 2 Document in Chart   Glucose, capillary Status: Abnormal   Collection Time: 05/22/15 9:16 PM  Result Value Ref Range   Glucose-Capillary 195 (H) 65 - 99 mg/dL      Imaging Results (Last 48 hours)    No results found.        Medical Problem List and Plan: 1. Functional deficits secondary to right BKA secondary to PVD/ nonhealing ulcer 05/21/2015 2. DVT Prophylaxis/Anticoagulation: Pradaxa 3. Pain Management: Hydrocodone and Robaxin as needed. Monitor with increased mobility 4. Acute blood loss anemia. Follow-up CBC 5. Neuropsych: This patient is capable of making decisions on his own behalf. 6. Skin/Wound Care: Routine skin checks 7. Fluids/Electrolytes/Nutrition: Routine I&O with follow-up chemistries 8. Wound culture Proteus Mirabalis. Continue doxycycline Cipro 2 weeks initiated 05/23/2015 9. Hypertension/atrial flutter with ablation/nonischemic cardiomyopathy. Continue Pradaxa, clonidine 0.2 mg twice a day, Lanoxin 0.125 mg daily, Lopressor 75 mg twice a day, Minipress 1 mg twice a day. Cardiac rate controlled 10. Chronic systolic congestive heart failure. Demadex 20 mg twice a day. Monitor for any signs of fluid overload 11. Diabetes mellitus of peripheral neuropathy. Hemoglobin A1c 9.0. NovoLog 6 units 3 times a day, Levemir 60 units bedtime. Check blood sugars before meals and at bedtime. Diabetic teaching 12. Chronic renal insufficiency. Baseline creatinine 1.58. Follow-up chemistries 13. Hyperlipidemia. Lopid/Lipitor 14. History of gout. Zyloprim 300 mg daily. Monitor for any gouty flareup  Post Admission Physician Evaluation: 1. Functional deficits secondary to right BKA secondary to PVD/ nonhealing ulcer 05/21/2015. 2. Patient is admitted to receive collaborative, interdisciplinary care between the physiatrist, rehab nursing staff, and therapy team. 3. Patient's level of medical complexity and substantial therapy needs in context of that medical necessity cannot be provided at a lesser intensity of care such as a SNF. 4. Patient has experienced substantial functional loss from his/her baseline which was documented above under the "Functional History" and "Functional Status" headings.  Judging by the patient's diagnosis, physical exam, and functional history, the patient has potential for functional progress which will result in measurable gains while on inpatient rehab. These gains will be of substantial and practical use upon discharge in facilitating mobility and self-care at the household level. 5. Physiatrist will provide 24 hour management of medical needs as well as oversight of the therapy plan/treatment and provide guidance as appropriate regarding the interaction of the two. 6. 24 hour rehab nursing will assist with bladder management, bowel management, safety, skin/wound care, disease management, medication administration, pain management and patient education and help integrate therapy concepts, techniques,education, etc. 7. PT will assess and treat for/with: pre gait, gait training, endurance , safety, equipment, neuromuscular re education. Goals are: Supervision to modified independent. 8. OT will assess and treat for/with: ADLs, Cognitive perceptual skills, Neuromuscular re education, safety, endurance, equipment. Goals are: Supervision to modified independent. Therapy May proceed with showering this patient. 9. SLP will assess and treat for/with: NA. Goals are: NA. 10. Case Management and Social Worker will assess and treat for psychological issues and discharge planning. 11. Team conference will be held weekly to assess progress toward goals and to determine barriers to discharge. 12. Patient will receive at least 3 hours of therapy per day at least 5 days per week. 13. ELOS: 9-13d  14. Prognosis: excellent     Charlett Blake M.D. [place]  FAAPM&R (Sports Med, Neuromuscular Med) Diplomate Am Board of Electrodiagnostic Med  05/23/2015

## 2015-05-24 NOTE — Progress Notes (Signed)
54 y.o. right handed male with history of hypertension, atrial flutter with ablation maintained on Pradaxa, nonischemic cardiomyopathy, chronic systolic congestive heart failure, diabetes mellitus and peripheral neuropathy, chronic renal insufficiency with baseline creatinine 1.58. Patient lives alone in [place]. Used a cane and walker prior to admission. One level home 2 steps to entry. Admitted 05/21/2015 with chronic right ankle foot ulcer followed by Dr. Sharol Given. Noted over the past 3 weeks with increasing foul-smelling odor and drainage from wound. X-rays of right foot showed some of the osseous findings secondary to possible Charcot arthropathy although osteomyelitis was a concern. 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 Sharol Given   Subjective/Complaints:   Objective: Vital Signs: Blood pressure 131/73, pulse 75, temperature 98.1 F (36.7 C), temperature source Oral, resp. rate 18, height 5' 11"  (1.803 m), weight 117.482 kg (259 lb), SpO2 98 %. No results found. Results for orders placed or performed during the hospital encounter of 05/23/15 (from the past 72 hour(s))  Glucose, capillary     Status: None   Collection Time: 05/23/15  5:08 PM  Result Value Ref Range   Glucose-Capillary 72 65 - 99 mg/dL  Glucose, capillary     Status: Abnormal   Collection Time: 05/23/15  8:54 PM  Result Value Ref Range   Glucose-Capillary 197 (H) 65 - 99 mg/dL  CBC WITH DIFFERENTIAL     Status: Abnormal   Collection Time: 05/24/15  5:20 AM  Result Value Ref Range   WBC 9.8 4.0 - 10.5 K/uL   RBC 3.40 (L) 4.22 - 5.81 MIL/uL   Hemoglobin 8.9 (L) 13.0 - 17.0 g/dL   HCT 28.7 (L) 39.0 - 52.0 %   MCV 84.4 78.0 - 100.0 fL   MCH 26.2 26.0 - 34.0 pg   MCHC 31.0 30.0 - 36.0 g/dL   RDW 15.5 11.5 - 15.5 %   Platelets 368 150 - 400 K/uL   Neutrophils Relative % 73 %   Neutro Abs 7.2 1.7 - 7.7 K/uL   Lymphocytes Relative 17 %   Lymphs Abs 1.6 0.7 -  4.0 K/uL   Monocytes Relative 6 %   Monocytes Absolute 0.6 0.1 - 1.0 K/uL   Eosinophils Relative 2 %   Eosinophils Absolute 0.2 0.0 - 0.7 K/uL   Basophils Relative 2 %   Basophils Absolute 0.2 (H) 0.0 - 0.1 K/uL  Comprehensive metabolic panel     Status: Abnormal   Collection Time: 05/24/15  5:20 AM  Result Value Ref Range   Sodium 136 135 - 145 mmol/L   Potassium 3.4 (L) 3.5 - 5.1 mmol/L   Chloride 101 101 - 111 mmol/L   CO2 26 22 - 32 mmol/L   Glucose, Bld 118 (H) 65 - 99 mg/dL   BUN 23 (H) 6 - 20 mg/dL   Creatinine, Ser 1.10 0.61 - 1.24 mg/dL   Calcium 7.6 (L) 8.9 - 10.3 mg/dL   Total Protein 5.9 (L) 6.5 - 8.1 g/dL   Albumin 1.5 (L) 3.5 - 5.0 g/dL   AST 23 15 - 41 U/L   ALT 19 17 - 63 U/L   Alkaline Phosphatase 179 (H) 38 - 126 U/L   Total Bilirubin 0.5 0.3 - 1.2 mg/dL   GFR calc non Af Amer >60 >60 mL/min   GFR calc Af Amer >60 >60 mL/min    Comment: (NOTE) The eGFR has been calculated using the CKD EPI equation. This calculation has not been  validated in all clinical situations. eGFR's persistently <60 mL/min signify possible Chronic Kidney Disease.    Anion gap 9 5 - 15  Glucose, capillary     Status: Abnormal   Collection Time: 05/24/15  6:46 AM  Result Value Ref Range   Glucose-Capillary 108 (H) 65 - 99 mg/dL  Glucose, capillary     Status: None   Collection Time: 05/24/15 12:04 PM  Result Value Ref Range   Glucose-Capillary 98 65 - 99 mg/dL     HEENT: normal and poor dentition Cardio: Irreg Irreg, no murmur Resp: CTA B/L and unlabored GI: BS positive and NT, ND Extremity:  Pulses positive and No Edema Skin:   Intact and Wound coban wrapping RLE Neuro: Alert/Oriented, Abnormal Sensory Reduced sensation Left foot and ankle to LT and Abnormal Motor 5/5 Bilateral deltoid, biceps, triceps, grip, left hip flexion 4 minus left knee extension 4, left ankle dorsi flexion plantar flexion 3 minus Musc/Skel:  Other right BKA with Coban wrapping, intrinsic atrophy left  foot Gen. no acute distress, mood and affect are appropriate   Assessment/Plan: 1. Functional deficits secondary to right below-knee amputation secondary to peripheral vascular disease, diabetes mellitus with neuropathy which require 3+ hours per day of interdisciplinary therapy in a comprehensive inpatient rehab setting. Physiatrist is providing close team supervision and 24 hour management of active medical problems listed below. Physiatrist and rehab team continue to assess barriers to discharge/monitor patient progress toward functional and medical goals. FIM: Function - Bathing Position: Wheelchair/chair at sink Body parts bathed by patient: Right arm, Left arm, Chest, Abdomen, Front perineal area, Right upper leg, Left upper leg Body parts bathed by helper: Buttocks Bathing not applicable: Right lower leg, Left lower leg Assist Level: Touching or steadying assistance(Pt > 75%)  Function- Upper Body Dressing/Undressing What is the patient wearing?: Hospital gown Assist Level: Touching or steadying assistance(Pt > 75%) Function - Lower Body Dressing/Undressing What is the patient wearing?: Hospital Gown Position: Wheelchair/chair at sink Assist for lower body dressing: Touching or steadying assistance (Pt > 75%)  Function - Toileting Toileting activity did not occur: N/A  Function - Air cabin crew transfer activity did not occur: N/A  Function - Chair/bed transfer Chair/bed transfer method: Stand pivot Chair/bed transfer assist level: Maximal assist (Pt 25 - 49%/lift and lower) Chair/bed transfer assistive device: Walker Chair/bed transfer details: Verbal cues for sequencing, Verbal cues for technique, Verbal cues for safe use of DME/AE, Manual facilitation for weight shifting  Function - Locomotion: Wheelchair Will patient use wheelchair at discharge?: Yes Type: Manual Max wheelchair distance: 150 Assist Level: Touching or steadying assistance (Pt > 75%) Assist  Level: Touching or steadying assistance (Pt > 75%) Assist Level: Touching or steadying assistance (Pt > 75%) Turns around,maneuvers to table,bed, and toilet,negotiates 3% grade,maneuvers on rugs and over doorsills: No Function - Locomotion: Ambulation Max distance: 10 Assist level: 2 helpers Assist level: 2 helpers Walk 50 feet with 2 turns activity did not occur: Safety/medical concerns Walk 150 feet activity did not occur: Safety/medical concerns Walk 10 feet on uneven surfaces activity did not occur: Safety/medical concerns  Function - Comprehension Comprehension: Auditory Comprehension assist level: Follows complex conversation/direction with extra time/assistive device  Function - Expression Expression: Verbal Expression assist level: Expresses basic needs/ideas: With no assist  Function - Social Interaction Social Interaction assist level: Interacts appropriately 90% of the time - Needs monitoring or encouragement for participation or interaction.  Function - Problem Solving Problem solving assist level: Solves basic problems with no  assist  Function - Memory Memory assist level: More than reasonable amount of time Patient normally able to recall (first 3 days only): Current season, Location of own room, Staff names and faces, That he or she is in a hospital  Medical Problem List and Plan: 1. Functional deficits secondary to right BKA secondary to PVD/ nonhealing ulcer 05/21/2015 2.  DVT Prophylaxis/Anticoagulation: Pradaxa 3. Pain Management: Hydrocodone and Robaxin as needed. Monitor with increased mobility 4. Acute blood loss anemia. Follow-up CBC 5. Neuropsych: This patient is capable of making decisions on his own behalf. 6. Skin/Wound Care: Routine skin checks 7. Fluids/Electrolytes/Nutrition: Routine I&O with follow-up chemistries 8. Wound culture Proteus Mirabalis. Continue doxycycline Cipro 2 weeks initiated 05/23/2015, monitor for diarrhea 9. Hypertension/atrial  flutter with ablation/nonischemic cardiomyopathy. Continue Pradaxa, clonidine 0.2 mg twice a day, Lanoxin 0.125 mg daily, Lopressor 75 mg twice a day, Minipress 1 mg twice a day. Cardiac rate controlled 10. Chronic systolic congestive heart failure. Demadex 20 mg twice a day. Monitor for any signs of fluid overload 11. Diabetes mellitus of peripheral neuropathy. Hemoglobin A1c 9.0. NovoLog 6 units 3 times a day, Levemir 60 units bedtime. Check blood sugars before meals and at bedtime. Diabetic teaching, last 2 CBGs were good at 77 and 100 12. Chronic renal insufficiency. Baseline creatinine 1.58. Follow-up chemistriesshow improvement of creatinine to 1.1 13. Hyperlipidemia. Lopid/Lipitor 14. History of gout. Zyloprim 300 mg daily. Thus far no evidence for gouty flareup   LOS (Days) 1 A FACE TO FACE EVALUATION WAS PERFORMED  Jerusha Reising E 05/24/2015, 12:52 PM

## 2015-05-24 NOTE — Interval H&P Note (Signed)
[name] was admitted today to Inpatient Rehabilitation with the diagnosis of Right BKA due to PVD 05/21/2015.  The patient's history has been reviewed, patient examined, and there is no change in status.  Patient continues to be appropriate for intensive inpatient rehabilitation.  I have reviewed the patient's chart and labs.  Questions were answered to the patient's satisfaction. The PAPE has been reviewed and assessment remains appropriate.  Charlett Blake 05/24/2015, 6:40 AM

## 2015-05-25 ENCOUNTER — Inpatient Hospital Stay (HOSPITAL_COMMUNITY): Payer: Medicare Other | Admitting: Physical Therapy

## 2015-05-25 ENCOUNTER — Inpatient Hospital Stay (HOSPITAL_COMMUNITY): Payer: Medicare Other | Admitting: Occupational Therapy

## 2015-05-25 LAB — TYPE AND SCREEN
ABO/RH(D): O POS
Antibody Screen: NEGATIVE
UNIT DIVISION: 0
Unit division: 0

## 2015-05-25 LAB — CULTURE, BLOOD (ROUTINE X 2)
CULTURE: NO GROWTH
Culture: NO GROWTH

## 2015-05-25 LAB — GLUCOSE, CAPILLARY
GLUCOSE-CAPILLARY: 79 mg/dL (ref 65–99)
GLUCOSE-CAPILLARY: 110 mg/dL — AB (ref 65–99)
Glucose-Capillary: 116 mg/dL — ABNORMAL HIGH (ref 65–99)
Glucose-Capillary: 78 mg/dL (ref 65–99)

## 2015-05-25 MED ORDER — FENOFIBRATE 160 MG PO TABS
160.0000 mg | ORAL_TABLET | Freq: Every day | ORAL | Status: DC
  Administered 2015-05-25 – 2015-06-07 (×14): 160 mg via ORAL
  Filled 2015-05-25 (×17): qty 1

## 2015-05-25 NOTE — Progress Notes (Signed)
Patient ID: [name], male   DOB: [birth year], 54 y.o.   MRN: [id number]   05/25/15.   54 y.o. right handed male with history of hypertension, atrial flutter with ablation maintained on Pradaxa, nonischemic cardiomyopathy, chronic systolic congestive heart failure, diabetes mellitus and peripheral neuropathy, chronic renal insufficiency with baseline creatinine 1.58.    Admitted 05/21/2015 with chronic right ankle foot ulcer followed by Dr. Sharol Given. Noted over the past 3 weeks with increasing foul-smelling odor and drainage from wound. X-rays of right foot showed some of the osseous findings secondary to possible Charcot arthropathy although osteomyelitis was a concern.   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 Sharol Given   Past Medical History  Diagnosis Date  . Hypertension   . Morbid obesity ([place])   . Atrial flutter ([place])  10/01/2006, 12/30/11    Status post RF ablation, 7/13, Dr. [name]  . OSA (obstructive sleep apnea)     compliant with CPAP  . Nonischemic cardiomyopathy ([place])     Normal coronary arteries, 2008  . Chronic systolic dysfunction of left ventricle     EF 20-25%, global HK; mild RVD, 2-D echo, 5/13  . Unspecified disorder of kidney and ureter   . RBBB, anterior fascicular block and incomplete LBBB   . Pulmonary hypertension ([facility])     RVSP 50-55 mmHg, 5/13  . Valvular heart disease     Mild MR/TR, 2-D echo, 5/13  . Type II or unspecified type diabetes mellitus without mention of complication, not stated as uncontrolled      Subjective/Complaints:  Doing well without concerns or complaints.  Comfortable night  Objective: Vital Signs: Blood pressure 108/67, pulse 66, temperature 98.1 F (36.7 C), temperature source Oral, resp. rate 17, height 5' 11" (1.803 m), weight 259 lb (117.482 kg), SpO2 96 %. No results found. Results for orders placed or performed during the hospital encounter of 05/23/15 (from the past  72 hour(s))  Glucose, capillary     Status: None   Collection Time: 05/23/15  5:08 PM  Result Value Ref Range   Glucose-Capillary 72 65 - 99 mg/dL  Glucose, capillary     Status: Abnormal   Collection Time: 05/23/15  8:54 PM  Result Value Ref Range   Glucose-Capillary 197 (H) 65 - 99 mg/dL  CBC WITH DIFFERENTIAL     Status: Abnormal   Collection Time: 05/24/15  5:20 AM  Result Value Ref Range   WBC 9.8 4.0 - 10.5 K/uL   RBC 3.40 (L) 4.22 - 5.81 MIL/uL   Hemoglobin 8.9 (L) 13.0 - 17.0 g/dL   HCT 28.7 (L) 39.0 - 52.0 %   MCV 84.4 78.0 - 100.0 fL   MCH 26.2 26.0 - 34.0 pg   MCHC 31.0 30.0 - 36.0 g/dL   RDW 15.5 11.5 - 15.5 %   Platelets 368 150 - 400 K/uL   Neutrophils Relative % 73 %   Neutro Abs 7.2 1.7 - 7.7 K/uL   Lymphocytes Relative 17 %   Lymphs Abs 1.6 0.7 - 4.0 K/uL   Monocytes Relative 6 %   Monocytes Absolute 0.6 0.1 - 1.0 K/uL   Eosinophils Relative 2 %   Eosinophils Absolute 0.2 0.0 - 0.7 K/uL   Basophils Relative 2 %   Basophils Absolute 0.2 (H) 0.0 - 0.1 K/uL  Comprehensive metabolic panel     Status: Abnormal   Collection Time: 05/24/15  5:20 AM  Result Value Ref Range  Sodium 136 135 - 145 mmol/L   Potassium 3.4 (L) 3.5 - 5.1 mmol/L   Chloride 101 101 - 111 mmol/L   CO2 26 22 - 32 mmol/L   Glucose, Bld 118 (H) 65 - 99 mg/dL   BUN 23 (H) 6 - 20 mg/dL   Creatinine, Ser 1.10 0.61 - 1.24 mg/dL   Calcium 7.6 (L) 8.9 - 10.3 mg/dL   Total Protein 5.9 (L) 6.5 - 8.1 g/dL   Albumin 1.5 (L) 3.5 - 5.0 g/dL   AST 23 15 - 41 U/L   ALT 19 17 - 63 U/L   Alkaline Phosphatase 179 (H) 38 - 126 U/L   Total Bilirubin 0.5 0.3 - 1.2 mg/dL   GFR calc non Af Amer >60 >60 mL/min   GFR calc Af Amer >60 >60 mL/min    Comment: (NOTE) The eGFR has been calculated using the CKD EPI equation. This calculation has not been validated in all clinical situations. eGFR's persistently <60 mL/min signify possible Chronic Kidney Disease.    Anion gap 9 5 - 15  Glucose, capillary      Status: Abnormal   Collection Time: 05/24/15  6:46 AM  Result Value Ref Range   Glucose-Capillary 108 (H) 65 - 99 mg/dL  Glucose, capillary     Status: None   Collection Time: 05/24/15 12:04 PM  Result Value Ref Range   Glucose-Capillary 98 65 - 99 mg/dL  Glucose, capillary     Status: Abnormal   Collection Time: 05/24/15  4:23 PM  Result Value Ref Range   Glucose-Capillary 61 (L) 65 - 99 mg/dL  Glucose, capillary     Status: Abnormal   Collection Time: 05/24/15  4:38 PM  Result Value Ref Range   Glucose-Capillary 60 (L) 65 - 99 mg/dL  Glucose, capillary     Status: Abnormal   Collection Time: 05/24/15  4:59 PM  Result Value Ref Range   Glucose-Capillary 102 (H) 65 - 99 mg/dL  Glucose, capillary     Status: Abnormal   Collection Time: 05/24/15  8:43 PM  Result Value Ref Range   Glucose-Capillary 144 (H) 65 - 99 mg/dL   Comment 1 Notify RN   Glucose, capillary     Status: Abnormal   Collection Time: 05/25/15  6:41 AM  Result Value Ref Range   Glucose-Capillary 116 (H) 65 - 99 mg/dL   Comment 1 Notify RN     Lab Results  Component Value Date   HGBA1C 9.0* 05/20/2015    HEENT: normal and poor dentition Cardio: Irreg Irreg, no murmur Resp: CTA B/L and unlabored GI: BS positive and NT, ND Extremity:  Pulses positive and No Edema Skin:   Intact and Wound coban wrapping RLE Neuro: Alert/Oriented, Abnormal Sensory Reduced sensation Left foot and ankle to LT and Abnormal Motor 5/5 Bilateral deltoid, biceps, triceps, grip, left hip flexion 4 minus left knee extension 4, left ankle dorsi flexion plantar flexion 3 minus Musc/Skel:  Other right BKA with Coban wrapping, intrinsic atrophy left foot Gen. no acute distress, mood and affect are appropriate   Medical Problem List and Plan: 1. Functional deficits secondary to right BKA secondary to PVD/ nonhealing ulcer 05/21/2015 2.  DVT Prophylaxis/Anticoagulation: Pradaxa 3. Pain Management: Hydrocodone and Robaxin as needed. Monitor  with increased mobility 4. Acute blood loss anemia. Follow-up CBC  5. Wound culture Proteus Mirabalis. Continue doxycycline Cipro 2 weeks initiated 05/23/2015, monitor for diarrhea 6. Hypertension/atrial flutter with ablation/nonischemic cardiomyopathy. Continue Pradaxa, clonidine 0.2 mg twice   a day, Lanoxin 0.125 mg daily, Lopressor 75 mg twice a day, Minipress 1 mg twice a day. Cardiac rate controlled 7. Chronic systolic congestive heart failure. Demadex 20 mg twice a day. Monitor for any signs of fluid overload 8. Diabetes mellitus of peripheral neuropathy. Hemoglobin A1c 9.0. NovoLog 6 units 3 times a day, Levemir 60 units bedtime. Check blood sugars before meals and at bedtime. Diabetic teaching, last 2 CBGs were good at 98 and 100 9. Chronic renal insufficiency. Baseline creatinine 1.58. Follow-up chemistriesshow improvement of creatinine to 1.1 10. Hyperlipidemia. Lopid/Lipitor presently; will switch to fenofibrate per pharmacy recommendation to decrease risk of rhabdo    LOS (Days) 2 A FACE TO FACE EVALUATION WAS PERFORMED  [email or web address],PETER FRANK 05/25/2015, 8:58 AM    

## 2015-05-25 NOTE — IPOC Note (Signed)
Overall Plan of Care [facility]) Patient Details Name: NTHONY LEFFERTS MRN: [id number] DOB: 03/21/1961  Admitting Diagnosis: R BKA  Hospital Problems: Active Problems:   Amputation of right lower extremity below knee [facility])     Functional Problem List: Nursing Bowel, Medication Management, Pain, Safety, Skin Integrity, Nutrition  PT Balance, Pain, Safety, Sensory, Endurance, Motor, Skin Integrity  OT Balance, Endurance, Motor, Safety  SLP    TR         Basic ADL's: OT Grooming, Bathing, Dressing, Toileting     Advanced  ADL's: OT Simple Meal Preparation, Laundry     Transfers: PT Bed Mobility, Bed to Chair, Musician, Manufacturing systems engineer, Metallurgist: PT Ambulation, Emergency planning/management officer, Stairs     Additional Impairments: OT None  SLP        TR      Anticipated Outcomes Item Anticipated Outcome  Self Feeding No goal  Swallowing      Basic self-care  Mod I - supervision  Toileting  Mod I   Bathroom Transfers Mod I - supervision  Bowel/Bladder     Transfers  mod I  Locomotion  mod I short household distances with RW, w/c in community supervision  Communication     Cognition     Pain     Safety/Judgment      Therapy Plan: PT Intensity: Minimum of 1-2 x/day ,45 to 90 minutes PT Frequency: 5 out of 7 days PT Duration Estimated Length of Stay: 14-18 days OT Intensity: Minimum of 1-2 x/day, 45 to 90 minutes OT Frequency: 5 out of 7 days OT Duration/Estimated Length of Stay: 16-18 days         Team Interventions: Nursing Interventions Patient/Family Education, Bowel Management, Disease Management/Prevention, Pain Management, Medication Management, Skin Care/Wound Management  PT interventions Ambulation/gait training, Training and development officer, Community reintegration, Discharge planning, Disease management/prevention, Engineer, drilling, Functional mobility training, Neuromuscular re-education, Patient/family education, Pain  management, Psychosocial support, Stair training, Therapeutic Exercise, UE/LE Coordination activities, Wheelchair propulsion/positioning, UE/LE Strength taining/ROM  OT Interventions Training and development officer, Academic librarian, Discharge planning, Disease mangement/prevention, Engineer, drilling, Functional mobility training, Pain management, Patient/family education, Psychosocial support, Self Care/advanced ADL retraining, Skin care/wound managment, Splinting/orthotics, Therapeutic Activities, Therapeutic Exercise, UE/LE Strength taining/ROM, Wheelchair propulsion/positioning  SLP Interventions    TR Interventions    SW/CM Interventions Discharge Planning, Psychosocial Support, Patient/Family Education    Team Discharge Planning: Destination: PT-Home ,OT- Home , SLP-  Projected Follow-up: PT-Home health PT, OT-  Home health OT, SLP-  Projected Equipment Needs: PT-To be determined, OT- 3 in 1 bedside comode, To be determined, Tub/shower bench, SLP-  Equipment Details: PT-pt has standard walker, small base quad cane, OT-  Patient/family involved in discharge planning: PT-  ,  OT-Patient, SLP-   MD ELOS: 16 days Medical Rehab Prognosis:  Excellent Assessment: The patient has been admitted for CIR therapies with the diagnosis of right BKA. The team will be addressing functional mobility, strength, stamina, balance, safety, adaptive techniques and equipment, self-care, bowel and bladder mgt, patient and caregiver education, pain mgt, pre-prosthetic education, ego support, community reintegration. . Goals have been set at mod I for transfers, locomotion, self-care and ADL's.     Meredith Staggers, MD, FAAPMR      See Team Conference Notes for weekly updates to the plan of care

## 2015-05-25 NOTE — Progress Notes (Signed)
Occupational Therapy Session Note  Patient Details  Name: [name] MRN: [id number] Date of Birth: August 31, 1960  Today's Date: 05/25/2015 OT Individual Time: 9211-9417 and 1345-1430 OT Individual Time Calculation (min): 60 min and 45 min   Short Term Goals: Week 1:  OT Short Term Goal 1 (Week 1): Pt will complete perineal hygiene at sit > stand with mod assist OT Short Term Goal 2 (Week 1): Pt will complete LB dressing with mod assist OT Short Term Goal 3 (Week 1): Pt will complete toilet transfers with mod assist OT Short Term Goal 4 (Week 1): Pt will complete bathing with min assist  Skilled Therapeutic Interventions/Progress Updates:    1) ADL retraining with focus on functional transfers, sit > stand, and increased safety and independence with self-care tasks.  Pt in bed upon arrival, performed squat pivot transfer to w/c with max assist followed by mod assist with transfers in and out of room shower with use of grab bars for UE support.  Educated on lateral leans to increase safety and independence with washing buttocks.  Engaged in sit > stand at sink with pt requiring max progressing to mod assist, pt with decreased forward weight shift requiring manual facilitation to achieve full, upright standing.  2) Treatment session with focus on functional transfers and BUE strengthening.  Introduced Warehouse manager to increase participation in transfers and decrease burden.  Educated on proper board placement to increase safety and technique to improve transitional movements while increasing UE strength.  Performed multiple transfers w/c <> mat table, increasing height of table throughout to increase challenge with uphill slope.  Pt min/steady assist with level transfers after therapist placement of board, and mod initially to min assist with increased uphill challenge.  Pt reports increased confidence with transfers with slide board.  Educated pt on tub/shower transfers with use of tub bench with  therapist demonstrating transfer.  Pt unsure w/c can fit in bathroom, reporting it is "tight". Plan to further assess and prepare for bathroom accessibility.  Therapy Documentation Precautions:  Precautions Precautions: Fall Restrictions Weight Bearing Restrictions: Yes RLE Weight Bearing: Non weight bearing General:   Vital Signs: Therapy Vitals Temp: 98.1 F (36.7 C) Temp Source: Oral Pulse Rate: 66 Resp: 17 BP: 108/67 mmHg Patient Position (if appropriate): Lying Oxygen Therapy SpO2: 96 % O2 Device: Not Delivered Pain: Pain Assessment Pain Assessment: No/denies pain Pain Score: 6  Pain Type: Acute pain;Surgical pain Pain Location: Leg Pain Orientation: Right Pain Descriptors / Indicators: Aching;Throbbing Pain Frequency: Intermittent Pain Onset: Gradual Patients Stated Pain Goal: 4 Pain Intervention(s): Medication (See eMAR);Repositioned  See Function Navigator for Current Functional Status.   Therapy/Group: Individual Therapy  Simonne Come 05/25/2015, 9:52 AM

## 2015-05-25 NOTE — Progress Notes (Addendum)
Physical Therapy Note  Patient Details  Name: [name] MRN: [id number] Date of Birth: 1960-10-24 Today's Date: 05/25/2015    Time 1: 1055-1150 55 minutes  1:1 No c/o pain at rest. Pt c/o residual limb pain with flexing knee.  PT encouraged desensitization throughout the day, pt verbalizes understanding.  W/c mobility throughout unit up to 200' without rest, pt improving UE endurance.  Sit to stands in parallel bars with mod A. Standing tolerance 3 x 1 minute, limited by LE fatigue.  Hopping in parallel bars with min A, pt able to scoot foot but unable to fully hop.  Pt encouraged to get a ramp for his STE, pt states he has a friend that plans to build one.  Squat/scoot transfers mat <> w/c with min/mod A to level surface.  Supine therex for R LE strengthening 2 x 10 SAQ, bridge, SLR, hip abd, hip add squeeze, trunk PNF with red medicine ball.  W/c push ups.  Pt motivated to improve.  Needs continued strengthening to achieve sit to stand without pulling up.   Time 2: 1300-1330 30 minutes  1:1 No c/o pain.  Treatment session focused on functional mobility.  Toilet transfer with mod A from level surface, max A to go uphill.  Attempt sit to stand at sink multiple times, pt unable to fully stand. Pt able to perform partial stand x 3 for donning pants, pt min A to don shirt.  Pt continues to  Require increased assist due to decreased LE/UE strength and endurance.  Deneane Stifter 05/25/2015, 1:07 PM

## 2015-05-26 ENCOUNTER — Inpatient Hospital Stay (HOSPITAL_COMMUNITY): Payer: Medicare Other | Admitting: Physical Therapy

## 2015-05-26 LAB — GLUCOSE, CAPILLARY
GLUCOSE-CAPILLARY: 62 mg/dL — AB (ref 65–99)
GLUCOSE-CAPILLARY: 72 mg/dL (ref 65–99)
Glucose-Capillary: 123 mg/dL — ABNORMAL HIGH (ref 65–99)
Glucose-Capillary: 108 mg/dL — ABNORMAL HIGH (ref 65–99)
Glucose-Capillary: 150 mg/dL — ABNORMAL HIGH (ref 65–99)

## 2015-05-26 MED ORDER — INSULIN DETEMIR 100 UNIT/ML ~~LOC~~ SOLN
56.0000 [IU] | Freq: Every day | SUBCUTANEOUS | Status: DC
  Administered 2015-05-26: 56 [IU] via SUBCUTANEOUS
  Administered 2015-05-27: 50 [IU] via SUBCUTANEOUS
  Administered 2015-05-28 – 2015-06-01 (×5): 56 [IU] via SUBCUTANEOUS
  Filled 2015-05-26 (×8): qty 0.56

## 2015-05-26 NOTE — Significant Event (Signed)
Hypoglycemic Event  CBG: 62 @ 0642  Treatment: 15 GM carbohydrate snack  Symptoms: None  Follow-up CBG: Time:0710 CBG Result:72  Possible Reasons for Event: Unknown: pt has 60 units Levemir HS, pt given HS snack. 05-25-15 HS CBG 110.   Comments/MD notified: Hermine Messick, MD notified on floor @ [phone]. No new orders received.      Newman Nickels

## 2015-05-26 NOTE — Progress Notes (Signed)
Physical Therapy Session Note  Patient Details  Name: [name] MRN: [id number] Date of Birth: [birth year]  Today's Date: 05/26/2015 PT Individual Time: 1450-1535 PT Individual Time Calculation (min): 45 min   Short Term Goals: Week 1:  PT Short Term Goal 1 (Week 1): Will perform bed mobility supervision without use of bedrails PT Short Term Goal 2 (Week 1): Will perform sit <>supine with supervision PT Short Term Goal 3 (Week 1): Will perform stand pivot transfer w/c <> bed with RW and minA PT Short Term Goal 4 (Week 1): Will perform gait x50' with RW and modA PT Short Term Goal 5 (Week 1): Will perform ascent/descent of one 3-inch stair with modA  Skilled Therapeutic Interventions/Progress Updates:   Pt limited in session by R shoulder pain. Improved shoulder pain s/p AAROM including increased ROM, but pt unable to carryover pain gains into functional tasks. Pt would continue to benefit from skilled PT services to increase functional mobility.  Therapy Documentation Precautions:  Precautions Precautions: Fall Restrictions Weight Bearing Restrictions: Yes RLE Weight Bearing: Non weight bearing Vital Signs: Therapy Vitals Temp: 97.6 F (36.4 C) Temp Source: Oral Pulse Rate: 65 Resp: 18 BP: (!) 114/58 mmHg Patient Position (if appropriate): Sitting Oxygen Therapy SpO2: 100 % O2 Device: Not Delivered Pain: Pain Assessment Pain Assessment: 0-10 Pain Score: 8  Pain Location: Arm Pain Intervention(s):  (graded activity) Mobility:  Pt performs transfers with Min A.  Other Treatments:   Pt educated on rehab plan, pain science, safety in mobility, progressing mobility, and outpatient pt for shoulder pain if indicated. Pt perform partial sit to stands x15 in session. Transfers x5.Pt performs anterior weight shifts 3x5. Pt perform L shoulder AAROM in flex with ventilatory cues, AAROM LAQs, seated knee flexion 2x10. Pt repositioned in neutral alignment. W/C propulsion to and  from gym performed. T/S soft tissue stretch 3x30". [place] mobs performs  See Function Navigator for Current Functional Status.   Therapy/Group: Individual Therapy  Monia Pouch 05/26/2015, 3:09 PM

## 2015-05-26 NOTE — Progress Notes (Signed)
Patient ID: [name], male   DOB: 02/24/1961, 54 y.o.   MRN: [id number]   Patient ID: [name], male   DOB: 11/20/1960, 54 y.o.   MRN: [id number]   05/26/15.   53 y.o. right handed male with history of hypertension, atrial flutter with ablation maintained on Pradaxa, nonischemic cardiomyopathy, chronic systolic congestive heart failure, diabetes mellitus and peripheral neuropathy, chronic renal insufficiency with baseline creatinine 1.58.    Admitted 05/21/2015 with chronic right ankle foot ulcer followed by Dr. Duda. Noted over the past 3 weeks with increasing foul-smelling odor and drainage from wound. X-rays of right foot showed some of the osseous findings secondary to possible Charcot arthropathy although osteomyelitis was a concern.   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 Duda.  Doing well; no complaints.  FBS down to 62 this am-asymptomatic   Past Medical History  Diagnosis Date  . Hypertension   . Morbid obesity (HCC)   . Atrial flutter (HCC)  10/01/2006, 12/30/11    Status post RF ablation, 7/13, Dr. Allred  . OSA (obstructive sleep apnea)     compliant with CPAP  . Nonischemic cardiomyopathy (HCC)     Normal coronary arteries, 2008  . Chronic systolic dysfunction of left ventricle     EF 20-25%, global HK; mild RVD, 2-D echo, 5/13  . Unspecified disorder of kidney and ureter   . RBBB, anterior fascicular block and incomplete LBBB   . Pulmonary hypertension (HCC)     RVSP 50-55 mmHg, 5/13  . Valvular heart disease     Mild MR/TR, 2-D echo, 5/13  . Type II or unspecified type diabetes mellitus without mention of complication, not stated as uncontrolled      Subjective/Complaints:  Doing well without concerns or complaints.  Comfortable night  Objective: Vital Signs: Blood pressure 128/79, pulse 69, temperature 97.7 F (36.5 C), temperature source Oral, resp. rate 20, height 5' 11" (1.803 m),  weight 259 lb (117.482 kg), SpO2 98 %. No results found. Results for orders placed or performed during the hospital encounter of 05/23/15 (from the past 72 hour(s))  Glucose, capillary     Status: None   Collection Time: 05/23/15  5:08 PM  Result Value Ref Range   Glucose-Capillary 72 65 - 99 mg/dL  Glucose, capillary     Status: Abnormal   Collection Time: 05/23/15  8:54 PM  Result Value Ref Range   Glucose-Capillary 197 (H) 65 - 99 mg/dL  CBC WITH DIFFERENTIAL     Status: Abnormal   Collection Time: 05/24/15  5:20 AM  Result Value Ref Range   WBC 9.8 4.0 - 10.5 K/uL   RBC 3.40 (L) 4.22 - 5.81 MIL/uL   Hemoglobin 8.9 (L) 13.0 - 17.0 g/dL   HCT 28.7 (L) 39.0 - 52.0 %   MCV 84.4 78.0 - 100.0 fL   MCH 26.2 26.0 - 34.0 pg   MCHC 31.0 30.0 - 36.0 g/dL   RDW 15.5 11.5 - 15.5 %   Platelets 368 150 - 400 K/uL   Neutrophils Relative % 73 %   Neutro Abs 7.2 1.7 - 7.7 K/uL   Lymphocytes Relative 17 %   Lymphs Abs 1.6 0.7 - 4.0 K/uL   Monocytes Relative 6 %   Monocytes Absolute 0.6 0.1 - 1.0 K/uL   Eosinophils Relative 2 %   Eosinophils Absolute 0.2 0.0 - 0.7 K/uL   Basophils Relative 2 %   Basophils Absolute   0.2 (H) 0.0 - 0.1 K/uL  Comprehensive metabolic panel     Status: Abnormal   Collection Time: 05/24/15  5:20 AM  Result Value Ref Range   Sodium 136 135 - 145 mmol/L   Potassium 3.4 (L) 3.5 - 5.1 mmol/L   Chloride 101 101 - 111 mmol/L   CO2 26 22 - 32 mmol/L   Glucose, Bld 118 (H) 65 - 99 mg/dL   BUN 23 (H) 6 - 20 mg/dL   Creatinine, Ser 1.10 0.61 - 1.24 mg/dL   Calcium 7.6 (L) 8.9 - 10.3 mg/dL   Total Protein 5.9 (L) 6.5 - 8.1 g/dL   Albumin 1.5 (L) 3.5 - 5.0 g/dL   AST 23 15 - 41 U/L   ALT 19 17 - 63 U/L   Alkaline Phosphatase 179 (H) 38 - 126 U/L   Total Bilirubin 0.5 0.3 - 1.2 mg/dL   GFR calc non Af Amer >60 >60 mL/min   GFR calc Af Amer >60 >60 mL/min    Comment: (NOTE) The eGFR has been calculated using the CKD EPI equation. This calculation has not been  validated in all clinical situations. eGFR's persistently <60 mL/min signify possible Chronic Kidney Disease.    Anion gap 9 5 - 15  Glucose, capillary     Status: Abnormal   Collection Time: 05/24/15  6:46 AM  Result Value Ref Range   Glucose-Capillary 108 (H) 65 - 99 mg/dL  Glucose, capillary     Status: None   Collection Time: 05/24/15 12:04 PM  Result Value Ref Range   Glucose-Capillary 98 65 - 99 mg/dL  Glucose, capillary     Status: Abnormal   Collection Time: 05/24/15  4:23 PM  Result Value Ref Range   Glucose-Capillary 61 (L) 65 - 99 mg/dL  Glucose, capillary     Status: Abnormal   Collection Time: 05/24/15  4:38 PM  Result Value Ref Range   Glucose-Capillary 60 (L) 65 - 99 mg/dL  Glucose, capillary     Status: Abnormal   Collection Time: 05/24/15  4:59 PM  Result Value Ref Range   Glucose-Capillary 102 (H) 65 - 99 mg/dL  Glucose, capillary     Status: Abnormal   Collection Time: 05/24/15  8:43 PM  Result Value Ref Range   Glucose-Capillary 144 (H) 65 - 99 mg/dL   Comment 1 Notify RN   Glucose, capillary     Status: Abnormal   Collection Time: 05/25/15  6:41 AM  Result Value Ref Range   Glucose-Capillary 116 (H) 65 - 99 mg/dL   Comment 1 Notify RN   Glucose, capillary     Status: None   Collection Time: 05/25/15 11:37 AM  Result Value Ref Range   Glucose-Capillary 79 65 - 99 mg/dL  Glucose, capillary     Status: None   Collection Time: 05/25/15  4:48 PM  Result Value Ref Range   Glucose-Capillary 78 65 - 99 mg/dL   Comment 1 Document in Chart   Glucose, capillary     Status: Abnormal   Collection Time: 05/25/15  8:32 PM  Result Value Ref Range   Glucose-Capillary 110 (H) 65 - 99 mg/dL   Comment 1 Notify RN   Glucose, capillary     Status: Abnormal   Collection Time: 05/26/15  6:42 AM  Result Value Ref Range   Glucose-Capillary 62 (L) 65 - 99 mg/dL   Comment 1 Notify RN   Glucose, capillary     Status: None   Collection  Time: 05/26/15  7:10 AM  Result  Value Ref Range   Glucose-Capillary 72 65 - 99 mg/dL    Lab Results  Component Value Date   HGBA1C 9.0* 05/20/2015    HEENT: normal and poor dentition Cardio: Irreg Irreg, no murmur Resp: CTA B/L and unlabored GI: BS positive and NT, ND Extremity:  Pulses positive and No Edema Skin:   Intact and Wound coban wrapping RLE Neuro: Alert/Oriented, Abnormal Sensory Reduced sensation Left foot and ankle to LT and Abnormal Motor 5/5 Bilateral deltoid, biceps, triceps, grip, left hip flexion 4 minus left knee extension 4, left ankle dorsi flexion plantar flexion 3 minus Musc/Skel:  Other right BKA with Coban wrapping, intrinsic atrophy left foot Gen. no acute distress, mood and affect are appropriate   Medical Problem List and Plan: 1. Functional deficits secondary to right BKA secondary to PVD/ nonhealing ulcer 05/21/2015 2.  DVT Prophylaxis/Anticoagulation: Pradaxa 3. Pain Management: Hydrocodone and Robaxin as needed. Monitor with increased mobility 4. Acute blood loss anemia. Follow-up CBC  5. Wound culture Proteus Mirabalis. Continue doxycycline Cipro 2 weeks initiated 05/23/2015, monitor for diarrhea 6. Hypertension/atrial flutter with ablation/nonischemic cardiomyopathy. Continue Pradaxa, clonidine 0.2 mg twice a day, Lanoxin 0.125 mg daily, Lopressor 75 mg twice a day, Minipress 1 mg twice a day. Cardiac rate controlled 7. Chronic systolic congestive heart failure. Demadex 20 mg twice a day. Monitor for any signs of fluid overload 8. Diabetes mellitus of peripheral neuropathy. Hemoglobin A1c 9.0. NovoLog 6 units 3 times a day, Levemir 60 units bedtime. Will downtitrate slightly due to low FBG.   Check blood sugars before meals and at bedtime. Diabetic teaching, last 2 CBGs were good at 98 and 100 9. Chronic renal insufficiency. Baseline creatinine 1.58. Follow-up chemistriesshow improvement of creatinine to 1.1 10. Hyperlipidemia. Lopid/Lipitor presently; will switch to fenofibrate per  pharmacy recommendation to decrease risk of rhabdo    LOS (Days) 3 A FACE TO FACE EVALUATION WAS PERFORMED  [email or web address],PETER FRANK 05/26/2015, 8:53 AM    

## 2015-05-27 ENCOUNTER — Inpatient Hospital Stay (HOSPITAL_COMMUNITY): Payer: Medicare Other

## 2015-05-27 ENCOUNTER — Inpatient Hospital Stay (HOSPITAL_COMMUNITY): Payer: Medicare Other | Admitting: Occupational Therapy

## 2015-05-27 LAB — GLUCOSE, CAPILLARY
GLUCOSE-CAPILLARY: 84 mg/dL (ref 65–99)
GLUCOSE-CAPILLARY: 103 mg/dL — AB (ref 65–99)
Glucose-Capillary: 66 mg/dL (ref 65–99)
Glucose-Capillary: 70 mg/dL (ref 65–99)
Glucose-Capillary: 166 mg/dL — ABNORMAL HIGH (ref 65–99)

## 2015-05-27 MED ORDER — INSULIN DETEMIR 100 UNIT/ML ~~LOC~~ SOLN
50.0000 [IU] | Freq: Once | SUBCUTANEOUS | Status: DC
  Filled 2015-05-27: qty 0.5

## 2015-05-27 NOTE — Progress Notes (Signed)
Occupational Therapy Session Note  Patient Details  Name: Carl PEREZPEREZ MRN: [id number] Date of Birth: 07-29-1961  Today's Date: 05/27/2015 OT Individual Time: 6606-3016 OT Individual Time Calculation (min): 44 min    Short Term Goals: Week 1:  OT Short Term Goal 1 (Week 1): Pt will complete perineal hygiene at sit > stand with mod assist OT Short Term Goal 2 (Week 1): Pt will complete LB dressing with mod assist OT Short Term Goal 3 (Week 1): Pt will complete toilet transfers with mod assist OT Short Term Goal 4 (Week 1): Pt will complete bathing with min assist  Skilled Therapeutic Interventions/Progress Updates:  Upon entering the room, pt seated in wheelchair with no c/o pain this session. Pt propelling wheelchair to ADL apartment 150' with supervision. Pt set up wheelchair for transfer, placed slide board, and practiced slide board transfer from wheelchair > to soft plush sofa with steady assist. Pt requiring increased time for removal of board after transfer. Pt transferring from sofa>wheelchair which was slightly uphill with min A as well for safety. Pt requiring min verbal cues for hand placement and technique for STS from wheelchair with RW. Pt unable to come to fill stand secondary to fear to let go of R armrest to grab RW. Pt required max A for partial standing secondary to fearfulness. Pt propelled wheelchair back to room in same manner as above. Pt remained in wheelchair with call bell and all needed items within reach upon exiting the room.   Therapy Documentation Precautions:  Precautions Precautions: Fall Restrictions Weight Bearing Restrictions: Yes RLE Weight Bearing: Non weight bearing Vital Signs: Therapy Vitals Temp: 98.1 F (36.7 C) Temp Source: Oral Pulse Rate: 62 Resp: 18 BP: 108/63 mmHg Patient Position (if appropriate): Sitting Oxygen Therapy SpO2: 99 % O2 Device: Not Delivered  See Function Navigator for Current Functional  Status.   Therapy/Group: Individual Therapy  Phineas Semen 05/27/2015, 4:13 PM

## 2015-05-27 NOTE — Progress Notes (Signed)
Physical Therapy Session Note  Patient Details  Name: [name] MRN: [id number] Date of Birth: May 13, 1961  Today's Date: 05/27/2015 PT Individual Time: 0830-0930; 11:30-12:00 PT Individual Time Calculation (min): 60 min   Short Term Goals: Week 1:  PT Short Term Goal 1 (Week 1): Will perform bed mobility supervision without use of bedrails PT Short Term Goal 2 (Week 1): Will perform sit <>supine with supervision PT Short Term Goal 3 (Week 1): Will perform stand pivot transfer w/c <> bed with RW and minA PT Short Term Goal 4 (Week 1): Will perform gait x50' with RW and modA PT Short Term Goal 5 (Week 1): Will perform ascent/descent of one 3-inch stair with modA  Skilled Therapeutic Interventions/Progress Updates:    Session 1:  Patient seen and participated in wheelchair mobility x 150' to therapy gym.  Slide board transfer assist to place board and cues and min assist for wheelchair set up.  Patient performed sit to stand transfers from elevated mat with min/mod assist and cues for safety.  Standing balance activities including with one UE support x 30 seconds, alternating touching targets in front, and twisting to look over right then left shoulder bilat UE support and min assist.  Took two hops forward and back twice with RW and min/mod assist difficulty clearing foot both forward and worse backward. Patient c/o left shoulder pain.  TTP over left  joint and over left supraspinatus.  Demonstrates AAROM limited by pain with horizontal abduction and with extension.  Seated isometrics 5x 5 reps fwd, back, IR/ER.  Then scapular squeezes x 10 5 sec hold.  Ice applied at end of session after propelling back to the room.  Left with all needs in reach.  Session 2:  Patient propelled wheelchair 150' and transfer to mat via slide board with instruction on board placement with supervision.  Sit to supine on mat supervision.  Patient performed LE therex consisted of knee presses, SLR, SAQ, hip  adduction x 5 sec holds, hip extension over bolster for 1/2 bridge all x 15, then 1/2 bridge on left x 15.  Supine to sit and SBT back to chair supervision.  W/c propulsion to room supervision and left with all needs in reach.  Therapy Documentation Precautions:  Precautions Precautions: Fall Restrictions Weight Bearing Restrictions: Yes RLE Weight Bearing: Non weight bearing Pain: Pain Assessment Pain Score: 5  Pain Type: Acute pain Pain Location: Shoulder Pain Orientation: Right Pain Descriptors / Indicators: Aching Pain Intervention(s): MD notified (Comment);Ambulation/increased activity   See Function Navigator for Current Functional Status.   Therapy/Group: Individual Therapy  [place], [place] 05/27/2015  05/27/2015, 9:12 AM

## 2015-05-27 NOTE — Discharge Instructions (Addendum)
Information on my medicine - Pradaxa (dabigatran)  This medication education was reviewed with me or my healthcare representative as part of my discharge preparation.    Why was Pradaxa prescribed for you? Pradaxa was prescribed for you to reduce the risk of forming blood clots that cause a stroke if you have a medical condition called atrial fibrillation (a type of irregular heartbeat).    What do you Need to know about PradAXa? Take your Pradaxa 150mg  TWICE DAILY - one capsule in the morning and one tablet in the evening with or without food.  It would be best to take the doses about the same time each day.  The capsules should not be broken, chewed or opened - they must be swallowed whole.  Do not store Pradaxa in other medication containers - once the bottle is opened the Pradaxa should be used within FOUR months; throw away any capsules that havent been by that time.  Take Pradaxa exactly as prescribed by your doctor.  DO NOT stop taking Pradaxa without talking to the doctor who prescribed the medication.  Stopping without other stroke prevention medication to take the place of Pradaxa may increase your risk of developing a clot that causes a stroke.  Refill your prescription before you run out.  After discharge, you should have regular check-up appointments with your healthcare provider that is prescribing your Pradaxa.  In the future your dose may need to be changed if your kidney function or weight changes by a significant amount.  What do you do if you miss a dose? If you miss a dose, take it as soon as you remember on the same day.  If your next dose is less than 6 hours away, skip the missed dose.  Do not take two doses of PRADAXA at the same time.  Important Safety Information A possible side effect of Pradaxa is bleeding. You should call your healthcare provider right away if you experience any of the following: ? Bleeding from an injury or your nose that does not  stop. ? Unusual colored urine (red or dark brown) or unusual colored stools (red or black). ? Unusual bruising for unknown reasons. ? A serious fall or if you hit your head (even if there is no bleeding).  Some medicines may interact with Pradaxa and might increase your risk of bleeding or clotting while on Pradaxa. To help avoid this, consult your healthcare provider or pharmacist prior to using any new prescription or non-prescription medications, including herbals, vitamins, non-steroidal anti-inflammatory drugs (NSAIDs) and supplements.  This website has more information on Pradaxa (dabigatran): [email or web address]://www.pradaxa.com    Inpatient Rehab Discharge Instructions  [place] Discharge date and time: No discharge date for patient encounter.   Activities/Precautions/ Functional Status: Activity: activity as tolerated Diet: diabetic diet Wound Care: keep wound clean and dry Functional status:  ___ No restrictions     ___ Walk up steps independently ___ 24/7 supervision/assistance   ___ Walk up steps with assistance ___ Intermittent supervision/assistance  ___ Bathe/dress independently ___ Walk with walker     ___ Bathe/dress with assistance ___ Walk Independently    ___ Shower independently _x_ Walk with assistance    ___ Shower with assistance ___ No alcohol     ___ Return to work/school ________  Special Instructions:    COMMUNITY REFERRALS UPON DISCHARGE:    Home Health:   PT,OT,RN  [place]   Date of last service:06/07/2015  Medical Equipment/Items Ordered:WIDE DROP-ARM BEDSIDE COMMODE, 30  TRANSFER BOARD, R-AMPUTEE PAD  Agency/Supplier:[facility]   [phone]   GENERAL COMMUNITY RESOURCES FOR PATIENT/FAMILY: Support Groups:[employer] SUPPORT GROUP  My questions have been answered and I understand these instructions. I will adhere to these goals and the provided educational materials after my discharge from the  hospital.  Patient/Caregiver Signature _______________________________ Date __________  Clinician Signature _______________________________________ Date __________  Please bring this form and your medication list with you to all your follow-up doctor's appointments.

## 2015-05-27 NOTE — Significant Event (Signed)
Hypoglycemic Event  CBG: 66  Treatment: 15 GM carbohydrate snack  Symptoms: None  Follow-up CBG: [id number] CBG Result:70  Possible Reasons for Event: Unknown  Comments/MD notified:Dan Angiulli, PA notified. Breakfast coming soon.     [name]

## 2015-05-27 NOTE — Progress Notes (Signed)
Occupational Therapy Session Note  Patient Details  Name: [name] MRN: [id number] Date of Birth: July 26, 1961  Today's Date: 05/27/2015 OT Individual Time:  -    1000-1100  (60 min)      Short Term Goals: Week 1:  OT Short Term Goal 1 (Week 1): Pt will complete perineal hygiene at sit > stand with mod assist OT Short Term Goal 2 (Week 1): Pt will complete LB dressing with mod assist OT Short Term Goal 3 (Week 1): Pt will complete toilet transfers with mod assist OT Short Term Goal 4 (Week 1): Pt will complete bathing with min assist         Skilled Therapeutic Interventions/Progress Updates:    Pt. Sitting in wc upon OT arrival.   Pt propelled wc to sink.  OT gathered supplies for shaving.  Went from sit to stand with max assist.  Stood to wash face with mod assist for balance for 1 minute.  Pt had decreased eccentric control   when going to sitting.   Pt c/o of back ache when standing and left shoulder pain with movement.  Pt. Sat at sink to shave with SBA for supplies.  Attempted sit to stand but pt unable dur to fatigued and weakness.    Left shoulder is tender to touch at bicep tendon.  Applied ice.      Therapy Documentation Precautions:  Precautions Precautions: Fall Restrictions Weight Bearing Restrictions: Yes RLE Weight Bearing: Non weight bearing      Pain:  4/10  Back and left shoulder           See Function Navigator for Current Functional Status.   Therapy/Group: Individual Therapy  Lisa Roca 05/27/2015, 7:56 AM

## 2015-05-28 ENCOUNTER — Inpatient Hospital Stay (HOSPITAL_COMMUNITY): Payer: Medicare Other | Admitting: Physical Therapy

## 2015-05-28 ENCOUNTER — Encounter: Payer: Self-pay | Admitting: Cardiology

## 2015-05-28 ENCOUNTER — Inpatient Hospital Stay (HOSPITAL_COMMUNITY): Payer: Medicare Other | Admitting: Occupational Therapy

## 2015-05-28 DIAGNOSIS — M25512 Pain in left shoulder: Secondary | ICD-10-CM

## 2015-05-28 LAB — GLUCOSE, CAPILLARY
GLUCOSE-CAPILLARY: 117 mg/dL — AB (ref 65–99)
Glucose-Capillary: 111 mg/dL — ABNORMAL HIGH (ref 65–99)
Glucose-Capillary: 84 mg/dL (ref 65–99)
Glucose-Capillary: 118 mg/dL — ABNORMAL HIGH (ref 65–99)

## 2015-05-28 NOTE — Progress Notes (Signed)
Physical Therapy Session Note  Patient Details  Name: [name] MRN: [id number] Date of Birth: Jul 01, 1961  Today's Date: 05/28/2015 PT Individual Time: 4132-4401 PT Individual Time Calculation (min): 30 min   Short Term Goals: Week 1:  PT Short Term Goal 1 (Week 1): Will perform bed mobility supervision without use of bedrails PT Short Term Goal 2 (Week 1): Will perform sit <>supine with supervision PT Short Term Goal 3 (Week 1): Will perform stand pivot transfer w/c <> bed with RW and minA PT Short Term Goal 4 (Week 1): Will perform gait x50' with RW and modA PT Short Term Goal 5 (Week 1): Will perform ascent/descent of one 3-inch stair with modA  Skilled Therapeutic Interventions/Progress Updates:   Session focused on transfer training and RLE strengthening/ROM. Patient propelled wheelchair using BUE to gym with supervision, c/o L shoulder pain due to "bursitis or tendinitis," premedicated. Performed uphill slide board transfer wheelchair > bed with mod I including wheelchair set up and slide board placement. Due to ease of board transfer, patient instructed in squat pivot transfer downhill x 2 with min A > supervision, uphill with close supervision. Performed sit <> supine with supervision, grimacing during pushing up with LUE. Seated/supine/sidelying RLE therex x 15 each exercise: SAQ, SLR, single leg bridging, sidelying hip flexion/extension to approx neutral, sidelying hip abduction with therapist blocking patient from rolling to supine to prevent compensatory strategies. Patient returned to wheelchair and left sitting in room with all needs within reach.     Therapy Documentation Precautions:  Precautions Precautions: Fall Restrictions Weight Bearing Restrictions: Yes RLE Weight Bearing: Non weight bearing Pain: Pain Assessment Pain Assessment: 0-10 Pain Score: 6  Pain Type: Acute pain Pain Location: Shoulder Pain Orientation: Left Pain Descriptors / Indicators:  Aching Pain Frequency: Constant Pain Onset: With Activity Patients Stated Pain Goal: 3 Pain Intervention(s): Other (Comment) (premedicated)  See Function Navigator for Current Functional Status.   Therapy/Group: Individual Therapy  Laretta Alstrom 05/28/2015, 2:37 PM

## 2015-05-28 NOTE — Progress Notes (Signed)
Notified on-call provider regarding BG versus amt of insulin to be administered. Pt BG 103 @ 2058 and was due to receive 56 units of levemir. New order received.

## 2015-05-28 NOTE — Progress Notes (Signed)
Physical Therapy Session Note  Patient Details  Name: [name] MRN: [id number] Date of Birth: 1961-01-05  Today's Date: 05/28/2015 PT Individual Time: 0902-1000 PT Individual Time Calculation (min): 58 min   Short Term Goals: Week 1:  PT Short Term Goal 1 (Week 1): Will perform bed mobility supervision without use of bedrails PT Short Term Goal 2 (Week 1): Will perform sit <>supine with supervision PT Short Term Goal 3 (Week 1): Will perform stand pivot transfer w/c <> bed with RW and minA PT Short Term Goal 4 (Week 1): Will perform gait x50' with RW and modA PT Short Term Goal 5 (Week 1): Will perform ascent/descent of one 3-inch stair with modA  Skilled Therapeutic Interventions/Progress Updates:    Pt received seated in w/c with no c/o pain and agreeable to treatment. W/c propulsion x150' with BUEs and increased time due to UE weakness and decreased endurance. Transfer w/c >mat table with transfer board and close supervision. Sit <>stand x5 trials with standing endurance activity while supporting balance with one UE on RW at all times, able to reach one hand forward to engage in card game for short durations. Activity very fatiguing and pt requires extended seated rest breaks between each trial; able to tolerate standing for approximately 1-3 min at a time. Stand pivot transfer mat table >w/c with RW from elevated table height with modA; increased difficulty with hopping LLE backwards and cues for pushing through UEs to A with foot clearance. Pt self-propelled w/c to return to room as above. Seated LE strengthening exercises including hip flexion, long arc quad, hip adduction isometric, glute sets. Educated pt on performance of seated exercises throughout the day, appropriate progression as tolerated. Also discussed with pt potential d/c of ambulation goals due to LUE shoulder pain limiting use of RW; pt agreeable and states he would be able to maneuver in home with w/c for ADLs. Pt  remained seated in w/c with all needs in reach at completion of session.  Therapy Documentation Precautions:  Precautions Precautions: Fall Restrictions Weight Bearing Restrictions: Yes RLE Weight Bearing: Non weight bearing Pain: Pain Assessment Pain Assessment: No/denies pain Pain Score: 0-No pain   See Function Navigator for Current Functional Status.   Therapy/Group: Individual Therapy  Luberta Mutter 05/28/2015, 12:23 PM

## 2015-05-28 NOTE — Progress Notes (Signed)
Occupational Therapy Session Note  Patient Details  Name: TEOFIL MANIACI MRN: [id number] Date of Birth: [birth year]  Today's Date: 05/28/2015 OT Individual Time: 1000-1100 OT Individual Time Calculation (min): 60 min    Short Term Goals: Week 1:  OT Short Term Goal 1 (Week 1): Pt will complete perineal hygiene at sit > stand with mod assist OT Short Term Goal 2 (Week 1): Pt will complete LB dressing with mod assist OT Short Term Goal 3 (Week 1): Pt will complete toilet transfers with mod assist OT Short Term Goal 4 (Week 1): Pt will complete bathing with min assist  Skilled Therapeutic Interventions/Progress Updates:    Treatment session with focus on BUE strengthening and w/c maneuvering in home and community environment.  Pt reports washing up prior to session and requesting to address ramp as he is concerned about accessing his home.  Pt reports friend installing ramp today, discussed recommended measurements, and engaged in ramp mobility with pt progressing from min assist to supervision both up and down the ramp.  Pt declined sit> stand and standing due to fatigue and soreness in Lt shoulder.  Engaged in functional mobility in ADL kitchen at w/c level with discussion of home modifications to increase accessibility of frequently used items.  Therapy Documentation Precautions:  Precautions Precautions: Fall Restrictions Weight Bearing Restrictions: Yes RLE Weight Bearing: Non weight bearing Pain: Pain Assessment Pain Score: 1   See Function Navigator for Current Functional Status.   Therapy/Group: Individual Therapy  Simonne Come 05/28/2015, 12:05 PM

## 2015-05-28 NOTE — Progress Notes (Signed)
Subjective/Complaints: No right lower extremity pain. Had some left shoulder pain. This occurred even prior to his surgery. No history of shoulder surgery or shoulder injections  ROS: Negative neck pain, denies any bowel bladder dysfunction or breathing issues  Objective: Vital Signs: Blood pressure 138/80, pulse 67, temperature 97.8 F (36.6 C), temperature source Oral, resp. rate 18, height 5\' 11"  (1.803 m), weight 117.482 kg (259 lb), SpO2 99 %. No results found. Results for orders placed or performed during the hospital encounter of 05/23/15 (from the past 72 hour(s))  Glucose, capillary     Status: None   Collection Time: 05/25/15 11:37 AM  Result Value Ref Range   Glucose-Capillary 79 65 - 99 mg/dL  Glucose, capillary     Status: None   Collection Time: 05/25/15  4:48 PM  Result Value Ref Range   Glucose-Capillary 78 65 - 99 mg/dL   Comment 1 Document in Chart   Glucose, capillary     Status: Abnormal   Collection Time: 05/25/15  8:32 PM  Result Value Ref Range   Glucose-Capillary 110 (H) 65 - 99 mg/dL   Comment 1 Notify RN   Glucose, capillary     Status: Abnormal   Collection Time: 05/26/15  6:42 AM  Result Value Ref Range   Glucose-Capillary 62 (L) 65 - 99 mg/dL   Comment 1 Notify RN   Glucose, capillary     Status: None   Collection Time: 05/26/15  7:10 AM  Result Value Ref Range   Glucose-Capillary 72 65 - 99 mg/dL  Glucose, capillary     Status: Abnormal   Collection Time: 05/26/15 11:27 AM  Result Value Ref Range   Glucose-Capillary 123 (H) 65 - 99 mg/dL  Glucose, capillary     Status: Abnormal   Collection Time: 05/26/15  4:22 PM  Result Value Ref Range   Glucose-Capillary 108 (H) 65 - 99 mg/dL  Glucose, capillary     Status: Abnormal   Collection Time: 05/26/15  8:51 PM  Result Value Ref Range   Glucose-Capillary 150 (H) 65 - 99 mg/dL  Glucose, capillary     Status: None   Collection Time: 05/27/15  6:40 AM  Result Value Ref Range    Glucose-Capillary 66 65 - 99 mg/dL  Glucose, capillary     Status: None   Collection Time: 05/27/15  6:55 AM  Result Value Ref Range   Glucose-Capillary 70 65 - 99 mg/dL  Glucose, capillary     Status: Abnormal   Collection Time: 05/27/15 11:29 AM  Result Value Ref Range   Glucose-Capillary 166 (H) 65 - 99 mg/dL  Glucose, capillary     Status: None   Collection Time: 05/27/15  4:27 PM  Result Value Ref Range   Glucose-Capillary 84 65 - 99 mg/dL  Glucose, capillary     Status: Abnormal   Collection Time: 05/27/15  8:58 PM  Result Value Ref Range   Glucose-Capillary 103 (H) 65 - 99 mg/dL   Comment 1 Notify RN   Glucose, capillary     Status: Abnormal   Collection Time: 05/28/15  7:02 AM  Result Value Ref Range   Glucose-Capillary 117 (H) 65 - 99 mg/dL   Comment 1 Notify RN      HEENT: normal and poor dentition Cardio: Irreg Irreg, no murmur Resp: CTA B/L and unlabored GI: BS positive and NT, ND Extremity:  Pulses positive and No Edema, Left shoulder positive impingement sign Skin:   Intact and Wound coban  wrapping RLE Neuro: Alert/Oriented, Abnormal Sensory Reduced sensation Left foot and ankle to LT and Abnormal Motor 5/5 Bilateral deltoid, biceps, triceps, grip, left hip flexion 4 minus left knee extension 4, left ankle dorsi flexion plantar flexion 3 minus Musc/Skel:  Other right BKA with Coban wrapping, intrinsic atrophy left foot Gen. no acute distress, mood and affect are appropriate   Assessment/Plan: 1. Functional deficits secondary to right below-knee amputation secondary to peripheral vascular disease, diabetes mellitus with neuropathy which require 3+ hours per day of interdisciplinary therapy in a comprehensive inpatient rehab setting. Physiatrist is providing close team supervision and 24 hour management of active medical problems listed below. Physiatrist and rehab team continue to assess barriers to discharge/monitor patient progress toward functional and medical  goals. FIM: Function - Bathing Position: Shower Body parts bathed by patient: Right arm, Left arm, Chest, Abdomen, Front perineal area, Right upper leg, Left upper leg, Buttocks, Left lower leg, Back Body parts bathed by helper: Buttocks Bathing not applicable: Right lower leg (Rt BKA) Assist Level: Touching or steadying assistance(Pt > 75%)  Function- Upper Body Dressing/Undressing What is the patient wearing?: Pull over shirt/dress Pull over shirt/dress - Perfomed by patient: Thread/unthread right sleeve, Thread/unthread left sleeve, Put head through opening Pull over shirt/dress - Perfomed by helper: Pull shirt over trunk Assist Level: Touching or steadying assistance(Pt > 75%) Function - Lower Body Dressing/Undressing What is the patient wearing?: Pants Position: Wheelchair/chair at sink Pants- Performed by helper: Pull pants up/down, Thread/unthread left pants leg, Thread/unthread right pants leg Non-skid slipper socks- Performed by patient: Don/doff left sock (Rt BKA) Assist for footwear: Setup Assist for lower body dressing: Touching or steadying assistance (Pt > 75%)  Function - Toileting Toileting activity did not occur: N/A  Function - Air cabin crew transfer activity did not occur: N/A Toilet transfer assistive device: Sliding board Assist level to toilet: Moderate assist (Pt 50 - 74%/lift or lower) Assist level from toilet: Moderate assist (Pt 50 - 74%/lift or lower)  Function - Chair/bed transfer Chair/bed transfer method: Lateral scoot, Stand pivot Chair/bed transfer assist level: Moderate assist (Pt 50 - 74%/lift or lower) (min A w/ slide board) Chair/bed transfer assistive device: Walker, Sliding board Chair/bed transfer details: Verbal cues for technique, Verbal cues for safe use of DME/AE  Function - Locomotion: Wheelchair Will patient use wheelchair at discharge?: Yes Type: Manual Max wheelchair distance: 150 Assist Level: Supervision or verbal  cues Assist Level: Supervision or verbal cues Assist Level: Supervision or verbal cues Turns around,maneuvers to table,bed, and toilet,negotiates 3% grade,maneuvers on rugs and over doorsills: No Function - Locomotion: Ambulation Assistive device: Walker-rolling Max distance: 2 Assist level: Moderate assist (Pt 50 - 74%) Assist level: 2 helpers Walk 50 feet with 2 turns activity did not occur: Safety/medical concerns Walk 150 feet activity did not occur: Safety/medical concerns Walk 10 feet on uneven surfaces activity did not occur: Safety/medical concerns  Function - Comprehension Comprehension: Auditory Comprehension assist level: Follows complex conversation/direction with no assist  Function - Expression Expression: Verbal Expression assist level: Expresses basic needs/ideas: With no assist  Function - Social Interaction Social Interaction assist level: Interacts appropriately 90% of the time - Needs monitoring or encouragement for participation or interaction.  Function - Problem Solving Problem solving assist level: Solves basic problems with no assist  Function - Memory Memory assist level: More than reasonable amount of time Patient normally able to recall (first 3 days only): Current season, Location of own room, Staff names and faces, That he  or she is in a hospital  Medical Problem List and Plan: 1. Functional deficits secondary to right BKA secondary to PVD/ nonhealing ulcer 05/21/2015, DC Coban wrapping, use Kerlix and Ace wrap 2.  DVT Prophylaxis/Anticoagulation: Pradaxa 3. Pain Management: Hydrocodone and Robaxin as needed. Monitor with increased mobility, Left shoulder pain local treatments consider cortisone injection if no better. 4. Acute blood loss anemia. Follow-up CBC 5. Neuropsych: This patient is capable of making decisions on his own behalf. 6. Skin/Wound Care: Routine skin checks 7. Fluids/Electrolytes/Nutrition: Routine I&O with follow-up  chemistries 8. Wound culture Proteus Mirabalis. Continue doxycycline Cipro 2 weeks initiated 05/23/2015, monitor for diarrhea 9. Hypertension/atrial flutter with ablation/nonischemic cardiomyopathy. Continue Pradaxa, clonidine 0.2 mg twice a day, Lanoxin 0.125 mg daily, Lopressor 75 mg twice a day, Minipress 1 mg twice a day. Cardiac rate controlled 10. Chronic systolic congestive heart failure. Demadex 20 mg twice a day. Monitor for any signs of fluid overload 11. Diabetes mellitus of peripheral neuropathy. Hemoglobin A1c 9.0. NovoLog 6 units 3 times a day, Levemir 60 units bedtime. Check blood sugars before meals and at bedtime. Diabetic teaching, last 2 CBGs were good at 103 and 117 12. Chronic renal insufficiency. Baseline creatinine 1.58. Follow-up chemistriesshow improvement of creatinine to 1.1 13. Hyperlipidemia. Lopid/Lipitor 14. History of gout. Zyloprim 300 mg daily. Thus far no evidence for gouty flareup   LOS (Days) 5 A FACE TO FACE EVALUATION WAS PERFORMED  KIRSTEINS,ANDREW E 05/28/2015, 8:18 AM

## 2015-05-28 NOTE — Progress Notes (Signed)
Occupational Therapy Session Note  Patient Details  Name: [name] MRN: [id number] Date of Birth: 07/17/1961  Today's Date: 05/28/2015 OT Individual Time: 1100-1200 OT Individual Time Calculation (min): 60 min    Short Term Goals: Week 1:  OT Short Term Goal 1 (Week 1): Pt will complete perineal hygiene at sit > stand with mod assist OT Short Term Goal 2 (Week 1): Pt will complete LB dressing with mod assist OT Short Term Goal 3 (Week 1): Pt will complete toilet transfers with mod assist OT Short Term Goal 4 (Week 1): Pt will complete bathing with min assist  Skilled Therapeutic Interventions/Progress Updates:    1:1 Focus functional w/c mobility with propelling w/c on different surfaces (uneven, uphillcarpet and tile) on the way to the solarium in the hospital.  Pt required min A up a small ramp and supervision down. PT requires more than reasonable amt of time propelling himself from one place to the next.  Practice effective pressure relief from w/c with mod cuing. Discussed options for sitting on different types of furniture at home. Pt transferred with steadying A from w/c into armed cushioned chair.  Attempted a squat pivot from chair to w/c but able to get enough clearance so completed transfer with slide board.    Therapy Documentation Precautions:  Precautions Precautions: Fall Restrictions Weight Bearing Restrictions: Yes RLE Weight Bearing: Non weight bearing Pain: Pain in left shoulder after session- applied heat pack and notified RN about pain in LE and UE  See Function Navigator for Current Functional Status.   Therapy/Group: Individual Therapy  Willeen Cass [facility] 05/28/2015, 3:26 PM

## 2015-05-29 ENCOUNTER — Inpatient Hospital Stay (HOSPITAL_COMMUNITY): Payer: Medicare Other | Admitting: Physical Therapy

## 2015-05-29 ENCOUNTER — Inpatient Hospital Stay (HOSPITAL_COMMUNITY): Payer: Medicare Other | Admitting: Occupational Therapy

## 2015-05-29 LAB — GLUCOSE, CAPILLARY
GLUCOSE-CAPILLARY: 116 mg/dL — AB (ref 65–99)
GLUCOSE-CAPILLARY: 176 mg/dL — AB (ref 65–99)
Glucose-Capillary: 104 mg/dL — ABNORMAL HIGH (ref 65–99)
Glucose-Capillary: 105 mg/dL — ABNORMAL HIGH (ref 65–99)

## 2015-05-29 MED ORDER — LIDOCAINE HCL (PF) 1 % IJ SOLN
20.0000 mL | Freq: Once | INTRAMUSCULAR | Status: AC
  Administered 2015-05-29: 5 mL
  Filled 2015-05-29: qty 20

## 2015-05-29 MED ORDER — LIDOCAINE HCL 1.5 % IJ SOLN
5.0000 mL | Freq: Once | INTRAMUSCULAR | Status: DC
  Filled 2015-05-29: qty 20

## 2015-05-29 MED ORDER — TRIAMCINOLONE ACETONIDE 40 MG/ML IJ SUSP (RADIOLOGY)
40.0000 mg | Freq: Once | INTRAMUSCULAR | Status: AC
  Administered 2015-05-29: 40 mg via INTRA_ARTICULAR
  Filled 2015-05-29: qty 1

## 2015-05-29 NOTE — Progress Notes (Signed)
Social Work Patient ID: [name], male   DOB: 1961/03/23, 55 y.o.   MRN: [id number] Met with pt to discuss team conferene goals-mod/i wheelchair level and discharge target 10/21.  He is having shoulder injected today and is hoping the pain will get better and he will be able to move And use a walker. Will have Mom measure doorways to see if wheelchair would fit in his bathroom. 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 Will see if pt would want peer support while here. He will think about. Will see if Mom can bring his wheelchair here and see if can get amputee pad for. Continue to work on support and discharge plans.

## 2015-05-29 NOTE — Progress Notes (Signed)
Subjective/Complaints: Left shoulder pain continues. This is interfering with therapy. Pain persists despite PT OT as well as pain medication. No tingling or numbness in the left hand  ROS: Negative neck pain, denies any bowel bladder dysfunction or breathing issues  Objective: Vital Signs: Blood pressure 133/70, pulse 68, temperature 98 F (36.7 C), temperature source Oral, resp. rate 19, height 5' 11"  (1.803 m), weight 116.801 kg (257 lb 8 oz), SpO2 99 %. No results found. Results for orders placed or performed during the hospital encounter of 05/23/15 (from the past 72 hour(s))  Glucose, capillary     Status: Abnormal   Collection Time: 05/26/15 11:27 AM  Result Value Ref Range   Glucose-Capillary 123 (H) 65 - 99 mg/dL  Glucose, capillary     Status: Abnormal   Collection Time: 05/26/15  4:22 PM  Result Value Ref Range   Glucose-Capillary 108 (H) 65 - 99 mg/dL  Glucose, capillary     Status: Abnormal   Collection Time: 05/26/15  8:51 PM  Result Value Ref Range   Glucose-Capillary 150 (H) 65 - 99 mg/dL  Glucose, capillary     Status: None   Collection Time: 05/27/15  6:40 AM  Result Value Ref Range   Glucose-Capillary 66 65 - 99 mg/dL  Glucose, capillary     Status: None   Collection Time: 05/27/15  6:55 AM  Result Value Ref Range   Glucose-Capillary 70 65 - 99 mg/dL  Glucose, capillary     Status: Abnormal   Collection Time: 05/27/15 11:29 AM  Result Value Ref Range   Glucose-Capillary 166 (H) 65 - 99 mg/dL  Glucose, capillary     Status: None   Collection Time: 05/27/15  4:27 PM  Result Value Ref Range   Glucose-Capillary 84 65 - 99 mg/dL  Glucose, capillary     Status: Abnormal   Collection Time: 05/27/15  8:58 PM  Result Value Ref Range   Glucose-Capillary 103 (H) 65 - 99 mg/dL   Comment 1 Notify RN   Glucose, capillary     Status: Abnormal   Collection Time: 05/28/15  7:02 AM  Result Value Ref Range   Glucose-Capillary 117 (H) 65 - 99 mg/dL   Comment 1  Notify RN   Glucose, capillary     Status: Abnormal   Collection Time: 05/28/15 11:14 AM  Result Value Ref Range   Glucose-Capillary 111 (H) 65 - 99 mg/dL  Glucose, capillary     Status: None   Collection Time: 05/28/15  4:25 PM  Result Value Ref Range   Glucose-Capillary 84 65 - 99 mg/dL  Glucose, capillary     Status: Abnormal   Collection Time: 05/28/15  8:52 PM  Result Value Ref Range   Glucose-Capillary 118 (H) 65 - 99 mg/dL  Glucose, capillary     Status: Abnormal   Collection Time: 05/29/15  6:19 AM  Result Value Ref Range   Glucose-Capillary 104 (H) 65 - 99 mg/dL     HEENT: normal and poor dentition Cardio: Irreg Irreg, no murmur Resp: CTA B/L and unlabored GI: BS positive and NT, ND Extremity:  Pulses positive and No Edema, Right BKA site unwrapped, incision line is clean, small open pink granulating areas on the medial and lateral aspects, No drainage Skin:   Intact Except as above Neuro: Alert/Oriented, Abnormal Sensory Reduced sensation Left foot and ankle to LT and Abnormal Motor 5/5 Bilateral deltoid, biceps, triceps, grip, left hip flexion 4 minus left knee extension 4, left  ankle dorsi flexion plantar flexion 3 minus Musc/Skel:  Positive impingement sign left upper extremity at 90. No evidence of left upper extreme swelling no erythema. Gen. no acute distress, mood and affect are appropriate   Assessment/Plan: 1. Functional deficits secondary to right below-knee amputation secondary to peripheral vascular disease, diabetes mellitus with neuropathy which require 3+ hours per day of interdisciplinary therapy in a comprehensive inpatient rehab setting. Physiatrist is providing close team supervision and 24 hour management of active medical problems listed below. Physiatrist and rehab team continue to assess barriers to discharge/monitor patient progress toward functional and medical goals. Team conference today please see physician documentation under team conference  tab, met with team face-to-face to discuss problems,progress, and goals. Formulized individual treatment plan based on medical history, underlying problem and comorbidities. FIM: Function - Bathing Position: Shower Body parts bathed by patient: Right arm, Left arm, Chest, Abdomen, Front perineal area, Right upper leg, Left upper leg, Buttocks, Left lower leg, Back Body parts bathed by helper: Buttocks Bathing not applicable: Right lower leg (Rt BKA) Assist Level: Touching or steadying assistance(Pt > 75%)  Function- Upper Body Dressing/Undressing What is the patient wearing?: Pull over shirt/dress Pull over shirt/dress - Perfomed by patient: Thread/unthread right sleeve, Thread/unthread left sleeve, Put head through opening Pull over shirt/dress - Perfomed by helper: Pull shirt over trunk Assist Level: Touching or steadying assistance(Pt > 75%) Function - Lower Body Dressing/Undressing What is the patient wearing?: Pants Position: Wheelchair/chair at sink Pants- Performed by helper: Pull pants up/down, Thread/unthread left pants leg, Thread/unthread right pants leg Non-skid slipper socks- Performed by patient: Don/doff left sock (Rt BKA) Assist for footwear: Setup Assist for lower body dressing: Touching or steadying assistance (Pt > 75%)  Function - Toileting Toileting activity did not occur: N/A Toileting steps completed by patient: Adjust clothing after toileting, Adjust clothing prior to toileting, Performs perineal hygiene Toileting steps completed by helper: Adjust clothing prior to toileting, Performs perineal hygiene, Adjust clothing after toileting Toileting Assistive Devices: Grab bar or rail Assist level: Supervision or verbal cues  Function - Air cabin crew transfer activity did not occur: N/A Toilet transfer assistive device: Sliding board Assist level to toilet: Supervision or verbal cues Assist level from toilet: Moderate assist (Pt 50 - 74%/lift or  lower)  Function - Chair/bed transfer Chair/bed transfer method: Lateral scoot, Squat pivot Chair/bed transfer assist level: Supervision or verbal cues Chair/bed transfer assistive device: Armrests Chair/bed transfer details: Verbal cues for technique  Function - Locomotion: Wheelchair Will patient use wheelchair at discharge?: Yes Type: Manual Max wheelchair distance: 150 Assist Level: Supervision or verbal cues Assist Level: Supervision or verbal cues Assist Level: Supervision or verbal cues Turns around,maneuvers to table,bed, and toilet,negotiates 3% grade,maneuvers on rugs and over doorsills: No Function - Locomotion: Ambulation Assistive device: Walker-rolling Max distance: 2 Assist level: Moderate assist (Pt 50 - 74%) Assist level: 2 helpers Walk 50 feet with 2 turns activity did not occur: Safety/medical concerns Walk 150 feet activity did not occur: Safety/medical concerns Walk 10 feet on uneven surfaces activity did not occur: Safety/medical concerns  Function - Comprehension Comprehension: Auditory Comprehension assist level: Follows complex conversation/direction with extra time/assistive device  Function - Expression Expression: Verbal Expression assist level: Expresses basic needs/ideas: With no assist  Function - Social Interaction Social Interaction assist level: Interacts appropriately 90% of the time - Needs monitoring or encouragement for participation or interaction.  Function - Problem Solving Problem solving assist level: Solves basic problems with no assist  Function -  Memory Memory assist level: More than reasonable amount of time Patient normally able to recall (first 3 days only): Current season, Location of own room, Staff names and faces, That he or she is in a hospital  Medical Problem List and Plan: 1. Functional deficits secondary to right BKA secondary to PVD/ nonhealing ulcer 05/21/2015, DC Coban wrapping, use Kerlix and Ace wrap 2.  DVT  Prophylaxis/Anticoagulation: Pradaxa 3. Pain Management: Hydrocodone and Robaxin as needed. Monitor with increased mobility,  4. Acute blood loss anemia. Follow-up CBC 5. Neuropsych: This patient is capable of making decisions on his own behalf. 6. Skin/Wound Care: Routine skin checks 7. Fluids/Electrolytes/Nutrition: Routine I&O with follow-up chemistries 8. Wound culture Proteus Mirabalis. Continue doxycycline Cipro 2 weeks initiated 05/23/2015, monitor for diarrhea 9. Hypertension/atrial flutter with ablation/nonischemic cardiomyopathy. Continue Pradaxa, clonidine 0.2 mg twice a day, Lanoxin 0.125 mg daily, Lopressor 75 mg twice a day, Minipress 1 mg twice a day. Cardiac rate controlled 10. Chronic systolic congestive heart failure. Demadex 20 mg twice a day. Monitor for any signs of fluid overload 11. Diabetes mellitus of peripheral neuropathy. Hemoglobin A1c 9.0. NovoLog 6 units 3 times a day, Levemir 60 units bedtime. Check blood sugars before meals and at bedtime. Diabetic teaching, last 2 CBGs were good at 104 and 118, May go up with Kenalog injection 12. Chronic renal insufficiency. Baseline creatinine 1.58. Follow-up chemistriesshow improvement of creatinine to 1.1 13. Hyperlipidemia. Lopid/Lipitor 14. History of gout. Zyloprim 300 mg daily. Thus far no evidence for gouty flareup 15.Left shoulder pain , Probable subacromial impingement syndrome cortisone injectionToday  LOS (Days) 6 A FACE TO FACE EVALUATION WAS PERFORMED  Zeth Buday E 05/29/2015, 9:51 AM

## 2015-05-29 NOTE — Patient Care Conference (Signed)
Inpatient RehabilitationTeam Conference and Plan of Care Update Date: 05/29/2015   Time: 11:00 AM    Patient Name: [name]      Medical Record Number: [id number]  Date of Birth: 1961-04-11 Sex: Male         Room/Bed: 4W13C/4W13C-01 Payor Info: Payor: MEDICARE / Plan: MEDICARE PART A AND B / Product Type: *No Product type* /    Admitting Diagnosis: R BKA  Admit Date/Time:  05/23/2015  5:01 PM Admission Comments: No comment available   Primary Diagnosis:  <principal problem not specified> Principal Problem: <principal problem not specified>  Patient Active Problem List   Diagnosis Date Noted  . Status post below knee amputation of right lower extremity ([place]) 05/23/2015  . Amputation of right lower extremity below knee ([place]) 05/23/2015  . Chronic osteomyelitis ([place]) 05/20/2015  . Right foot ulcer ([place]) 05/20/2015  . DM (diabetes mellitus) type 2, uncontrolled 05/20/2015  . Systolic dysfunction with acute on chronic heart failure ([place]) 01/25/2015  . Bladder cancer ([place])   . Non-ischemic cardiomyopathy ([place])   . Hematuria 10/14/2014  . Bladder tumor 10/14/2014  . Cellulitis   . Atrial fibrillation with RVR ([place])   . LBBB (left bundle branch block) 10/02/2013  . Anasarca 03/25/2012  . HLD (hyperlipidemia) 03/09/2012  . Lower extremity edema 12/30/2011  . Chronic systolic dysfunction of left ventricle 07/29/2011  . Chronic anticoagulation 12/10/2010  . VENOUS INSUFFICIENCY, LEGS 05/17/2009  . OBESITY-MORBID (>100') 05/06/2009  . Essential hypertension 05/06/2009  . CARDIOMYOPATHY, SECONDARY 05/06/2009  . Atrial flutter ([place]) 05/06/2009    Expected Discharge Date: Expected Discharge Date: 06/07/15  Team Members Present: Physician leading conference: Dr. Alysia Penna Social Worker Present: Ovidio Kin, LCSW Nurse Present: Heather Roberts, RN PT Present: Kem Parkinson, PT OT Present: Simonne Come, OT PPS Coordinator present : Daiva Nakayama, RN, CRRN     Current  Status/Progress Goal Weekly Team Focus  Medical   left shoulder pain, R BKA is healing  Home with Mod I  Left shoulder injection   Bowel/Bladder   continent of bowel and bladder; 05/27/15  mod I  educate and monitor s/s of constipation   Swallow/Nutrition/ Hydration     na        ADL's   min assist bathing and UB dressing, max assist LB dressing, supervision transfers with slide board - mod assist squat pivot, max assist sit > stand  Mod I w/c level, supervision shower transfers  activity tolerance, sit <> stand, transfers, standing balance with LB self-care tasks   Mobility   supervision bed mobility, transfers with transfer board, mod/maxA sit <>stand and stand pivot with RW, gait mod/maxA x10'  modI bed mobility, transfers, w/c propulsion, modA ambulation with therapy only  activity tolerance, sit <>stand, transfers, standing balance,    Communication     na        Safety/Cognition/ Behavioral Observations    no unsafe behaviors        Pain   c/o L shoulder pain, R BKA phantom pain and surgical pain; 5-10mg  Oxy IR q 3hr prn, 1-2 Norco q 4hr prn  <4 on a 0-10 scale  assess pain q 4hr and prn   Skin   R BKA cobain dressing in place; CDI  remain free from breakdown while on rehab  assess skin q shift and prn      *See Care Plan and progress notes for long and short-term goals.  Barriers to Discharge: Left shoulder pain interfering with therapy  Possible Resolutions to Barriers:  see above    Discharge Planning/Teaching Needs:  HOme alone with Mom and friedns coming to check on daily. Needs to be mod/i level either wheelchair level or ambulation      Team Discussion:  Goals-mod/i wheelchair level-working on activity tolerance and pain issues. L-shoulder pain-injected today. Building ramp. Dressing taken off today-looks good-begin ace wrapping.   Revisions to Treatment Plan:  none   Continued Need for Acute Rehabilitation Level of Care: The patient requires daily medical  management by a physician with specialized training in physical medicine and rehabilitation for the following conditions: Daily direction of a multidisciplinary physical rehabilitation program to ensure safe treatment while eliciting the highest outcome that is of practical value to the patient.: Yes Daily medical management of patient stability for increased activity during participation in an intensive rehabilitation regime.: Yes Daily analysis of laboratory values and/or radiology reports with any subsequent need for medication adjustment of medical intervention for : Other;Post surgical problems  Bowden Boody, Gardiner Rhyme 05/29/2015, 1:04 PM

## 2015-05-29 NOTE — Progress Notes (Signed)
Social Work Elease Hashimoto, LCSW Social Worker Signed  Patient Care Conference 05/29/2015  1:04 PM    Expand All Collapse All   Inpatient RehabilitationTeam Conference and Plan of Care Update Date: 05/29/2015   Time: 11:00 AM     Patient Name: [name]       Medical Record Number: [id number]  Date of Birth: 11/28/1960 Sex: Male         Room/Bed: 4W13C/4W13C-01 Payor Info: Payor: MEDICARE / Plan: MEDICARE PART A AND B / Product Type: *No Product type* /    Admitting Diagnosis: R BKA   Admit Date/Time:  05/23/2015  5:01 PM Admission Comments: No comment available   Primary Diagnosis:  <principal problem not specified> Principal Problem: <principal problem not specified>    Patient Active Problem List     Diagnosis  Date Noted   .  Status post below knee amputation of right lower extremity ([place])  05/23/2015   .  Amputation of right lower extremity below knee ([place])  05/23/2015   .  Chronic osteomyelitis ([place])  05/20/2015   .  Right foot ulcer ([place])  05/20/2015   .  DM (diabetes mellitus) type 2, uncontrolled  05/20/2015   .  Systolic dysfunction with acute on chronic heart failure ([place])  01/25/2015   .  Bladder cancer ([place])     .  Non-ischemic cardiomyopathy ([place])     .  Hematuria  10/14/2014   .  Bladder tumor  10/14/2014   .  Cellulitis     .  Atrial fibrillation with RVR ([place])     .  LBBB (left bundle branch block)  10/02/2013   .  Anasarca  03/25/2012   .  HLD (hyperlipidemia)  03/09/2012   .  Lower extremity edema  12/30/2011   .  Chronic systolic dysfunction of left ventricle  07/29/2011   .  Chronic anticoagulation  12/10/2010   .  VENOUS INSUFFICIENCY, LEGS  05/17/2009   .  OBESITY-MORBID (>100')  05/06/2009   .  Essential hypertension  05/06/2009   .  CARDIOMYOPATHY, SECONDARY  05/06/2009   .  Atrial flutter ([place])  05/06/2009     Expected Discharge Date: Expected Discharge Date: 06/07/15  Team Members Present: Physician leading conference: Dr. Alysia Penna Social Worker Present: Ovidio Kin, LCSW Nurse Present: Heather Roberts, RN PT Present: Kem Parkinson, PT OT Present: Simonne Come, OT PPS Coordinator present : Daiva Nakayama, RN, CRRN        Current Status/Progress  Goal  Weekly Team Focus   Medical     left shoulder pain, R BKA is healing   Home with Mod I  Left shoulder injection   Bowel/Bladder     continent of bowel and bladder; 05/27/15   mod I  educate and monitor s/s of constipation    Swallow/Nutrition/ Hydration       na         ADL's     min assist bathing and UB dressing, max assist LB dressing, supervision transfers with slide board - mod assist squat pivot, max assist sit > stand  Mod I w/c level, supervision shower transfers   activity tolerance, sit <> stand, transfers, standing balance with LB self-care tasks   Mobility     supervision bed mobility, transfers with transfer board, mod/maxA sit <>stand and stand pivot with RW, gait mod/maxA x10'  modI bed mobility, transfers, w/c propulsion, modA ambulation with therapy only  activity tolerance, sit <>stand, transfers, standing  balance,    Communication       na         Safety/Cognition/ Behavioral Observations      no unsafe behaviors         Pain     c/o L shoulder pain, R BKA phantom pain and surgical pain; 5-10mg  Oxy IR q 3hr prn, 1-2 Norco q 4hr prn  <4 on a 0-10 scale  assess pain q 4hr and prn   Skin     R BKA cobain dressing in place; CDI   remain free from breakdown while on rehab   assess skin q shift and prn      *See Care Plan and progress notes for long and short-term goals.    Barriers to Discharge:  Left shoulder pain interfering with therapy      Possible Resolutions to Barriers:   see above     Discharge Planning/Teaching Needs:   HOme alone with Mom and friedns coming to check on daily. Needs to be mod/i level either wheelchair level or ambulation        Team Discussion:    Goals-mod/i wheelchair level-working on activity  tolerance and pain issues. L-shoulder pain-injected today. Building ramp. Dressing taken off today-looks good-begin ace wrapping.    Revisions to Treatment Plan:    none    Continued Need for Acute Rehabilitation Level of Care: The patient requires daily medical management by a physician with specialized training in physical medicine and rehabilitation for the following conditions: Daily direction of a multidisciplinary physical rehabilitation program to ensure safe treatment while eliciting the highest outcome that is of practical value to the patient.: Yes Daily medical management of patient stability for increased activity during participation in an intensive rehabilitation regime.: Yes Daily analysis of laboratory values and/or radiology reports with any subsequent need for medication adjustment of medical intervention for : Other;Post surgical problems  Elease Hashimoto 05/29/2015, 1:04 PM                  Patient ID: [name], male   DOB: 1961/04/17, 54 y.o.   MRN: [id number]

## 2015-05-29 NOTE — Progress Notes (Signed)
Occupational Therapy Session Note  Patient Details  Name: [name] MRN: [id number] Date of Birth: 05/12/1961  Today's Date: 05/29/2015 OT Individual Time: 1002-1102 OT Individual Time Calculation (min): 60 min    Short Term Goals: Week 1:  OT Short Term Goal 1 (Week 1): Pt will complete perineal hygiene at sit > stand with mod assist OT Short Term Goal 2 (Week 1): Pt will complete LB dressing with mod assist OT Short Term Goal 3 (Week 1): Pt will complete toilet transfers with mod assist OT Short Term Goal 4 (Week 1): Pt will complete bathing with min assist  Skilled Therapeutic Interventions/Progress Updates:    ADL retraining with focus on care for residual limb, functional transfers, and LB dressing.  Pt residual limb unwrapped upon arrival.  Utilized Corporate treasurer for pt to inspect incision and further educate pt on frequent skin checks.  Educated pt on residual limb wrapping in figure 8 pattern with therapist performing wrapping, encouraging pt to utilize mirror to check it afterwards.  Covered residual limb and wrapping with plastic bag to allow pt to bathe at shower level.  Performed lateral scoot transfer w/c > tub bench with min assist, pt completed all bathing with supervision - min/steady assist with lateral leans to wash buttocks.  Educated on lateral leans for LB dressing as pt with decreased sit > stand and standing tolerance.  Pt performed LB dressing at EOB with lateral leans to pull pants over hips.  Therapy Documentation Precautions:  Precautions Precautions: Fall Restrictions Weight Bearing Restrictions: Yes RLE Weight Bearing: Non weight bearing Pain: Pain Assessment Pain Assessment: 0-10 Pain Score: 0-No pain  See Function Navigator for Current Functional Status.   Therapy/Group: Individual Therapy  Simonne Come 05/29/2015, 1:35 PM

## 2015-05-29 NOTE — Procedures (Addendum)
Shoulder injection Left   Indication:Left Shoulder pain not relieved by medication management and other conservative care.  Informed consent was obtained after describing risks and benefits of the procedure with the patient, this includes bleeding, bruising, infection and medication side effects. The patient wishes to proceed and has given  consent. Patient was placed in a seated position. The Left shoulder was marked and prepped with betadine in the subacromial area. A 25-gauge 1-1/2 inch needle was inserted into the subacromial area. After negative draw back for blood, a solution containing 1 mL of 6 mg per ML betamethasone and 4 mL of 1% lidocaine was injected. A band aid was applied. The patient tolerated the procedure well. Post procedure instructions were given.

## 2015-05-29 NOTE — Progress Notes (Signed)
Physical Therapy Session Note  Patient Details  Name: [name] MRN: [id number] Date of Birth: 07/18/1961  Today's Date: 05/29/2015 PT Individual Time: 0800-0900 and 1130-1200 and 1400-1500 PT Individual Time Calculation (min): 60 min and 30 min and 60 min (total 150 min)    Short Term Goals: Week 1:  PT Short Term Goal 1 (Week 1): Will perform bed mobility supervision without use of bedrails PT Short Term Goal 2 (Week 1): Will perform sit <>supine with supervision PT Short Term Goal 3 (Week 1): Will perform stand pivot transfer w/c <> bed with RW and minA PT Short Term Goal 4 (Week 1): Will perform gait x50' with RW and modA PT Short Term Goal 5 (Week 1): Will perform ascent/descent of one 3-inch stair with modA  Skilled Therapeutic Interventions/Progress Updates:    0800-0900: Pt received supine in bed; c/o mild L shoulder pain however does not rate. Supine>sit with HOB elevated and bedrails with supervision. Squat pivot bed >w/c with supervision. Pt requests to use restroom; squat pivot w/c <> toilet with supervision to L onto toilet, minA to R off of toilet to A with clearing hips over cushion. W/c propulsion x150' with supervision, BUEs. In parallel bars, addressed sit <>stand with modA x5 reps, and upon standing performed 3-10 hops in place. Performed to improve pt confidence, upper body strength, and body mechanics of scapular depression to A with foot clearance. Seated on edge of mat table, performed shoulder/upper body exercises with level 1 theraband, including B shoulder external rotation, scapular rows, bicep curls. Palpation to L shoulder; noted point tenderness at midpoint of clavicle, trigger point in infraspinatus, pain with eccentric control of returning arm to neutral in shoulder flexion, relief of pain and increase in ROM with positive scapular assistance test. Pt reports increase in L shoulder pain with L lateral flexion of cervical spine, however Spurling's test negative.  Trigger point massage to infraspinatus. Pt returned to room and remained seated in w/c with all needs within reach at completion of session.   1130-1200: Pt received seated in w/c with RN present; c/o pain as below and agreeable to treatment. Session focused on w/c propulsion in a variety of environments in a community setting to improve pts ability to manage w/c for community access. Performed >300' at a time with BUEs and short rest breaks between. Surfaces included brick, carpet, tile, incline/declines. Requires maxA for control of speed on decline, and modA for propelling back up incline due to strength impairments and decreased power production BUEs. Pt returned to room and remained seated in w/c with all needs within reach at completion of session.   1400-1500: Pt received seated in w/c with no c/o pain and agreeable to treatment. Propelled w/c to/from therapy gym x150' with supervision. Transfer w/c <> mat table with squat pivot and close supervision. Seated and supine LE strengthening exercises performed for 2 sets 15 reps of hip flexion marching, long arc quad (both with 2# ankle weights, RLE below knee), hip adduction isometric, straight leg raise, normal LLE bridging, modified RLE bridging on bolster, sidelying hip abduction/adduction. Bed mobility on mat table performed with supervision overall, increased time to perform and min verbal cues for technique. 2 sets 5 reps sit ups from semi-reclined position on wedge; required modA due to core strength deficits. Sitting trunk rotation with weighted ball passing to R/L for 2 sets 2 min with cues for focus on trunk rotation and reduce UE reaching. 2 sets 10 front raises with 3# weighted bar.  Pt returned to room and remained seated in w/c with all needs in reach at completion of session.   Therapy Documentation Precautions:  Precautions Precautions: Fall Restrictions Weight Bearing Restrictions: Yes RLE Weight Bearing: Non weight  bearing Pain: Pain Assessment Pain Assessment: 0-10 Pain Score: 3/10 Pain Type: Acute pain Pain Location: Shoulder Pain Orientation: Left Pain Descriptors / Indicators: Aching Pain Frequency: Constant Pain Onset: On-going Pain Intervention(s): Medication (See eMAR)   See Function Navigator for Current Functional Status.   Therapy/Group: Individual Therapy  Luberta Mutter 05/29/2015, 9:26 AM

## 2015-05-30 ENCOUNTER — Inpatient Hospital Stay (HOSPITAL_COMMUNITY): Payer: Medicare Other | Admitting: Occupational Therapy

## 2015-05-30 ENCOUNTER — Inpatient Hospital Stay (HOSPITAL_COMMUNITY): Payer: Medicare Other | Admitting: Physical Therapy

## 2015-05-30 LAB — GLUCOSE, CAPILLARY
GLUCOSE-CAPILLARY: 191 mg/dL — AB (ref 65–99)
Glucose-Capillary: 120 mg/dL — ABNORMAL HIGH (ref 65–99)
Glucose-Capillary: 126 mg/dL — ABNORMAL HIGH (ref 65–99)
Glucose-Capillary: 177 mg/dL — ABNORMAL HIGH (ref 65–99)

## 2015-05-30 NOTE — Progress Notes (Signed)
Physical Therapy Session Note  Patient Details  Name: [name] MRN: [id number] Date of Birth: 1961/04/30  Today's Date: 05/30/2015 PT Individual Time: 1000-1100 PT Individual Time Calculation (min): 60 min   Short Term Goals: Week 1:  PT Short Term Goal 1 (Week 1): Will perform bed mobility supervision without use of bedrails PT Short Term Goal 2 (Week 1): Will perform sit <>supine with supervision PT Short Term Goal 3 (Week 1): Will perform stand pivot transfer w/c <> bed with RW and minA PT Short Term Goal 4 (Week 1): Will perform gait x50' with RW and modA PT Short Term Goal 5 (Week 1): Will perform ascent/descent of one 3-inch stair with modA  Skilled Therapeutic Interventions/Progress Updates:    Pt received seated in w/c with c/o L shoulder as above and agreeable to treatment. W/c propulsion to/from therapy gym x100' with supervision. Squat pivot w/c <>mat table with close supervision. Sit <>stand from elevated mat table progressively lowered for second set; 3 sets 5 reps. Cues for hand placement for safety. Standing activity tolerance with UE support on RW and occasional LLE resting on mat table behind; 3 sets of 3 min each while engaged in card game. Pt encouraged to focus on eccentric control of stand >sit with sit <>stand reps and following prolonged standing, however pt has difficulty due to strength deficits and requires use of 1 UE to control speed when sitting. Nustep x12 min on level 4 average 30 steps/min with BUE/LLE, with RLE supported on stool. Requires several short rest breaks due to fatigue; reports L shoulder not increasing pain or discomfort. Pt returned to room and remained seated in w/c with all needs in reach at completion of session.   Therapy Documentation Precautions:  Precautions Precautions: Fall Restrictions Weight Bearing Restrictions: Yes RLE Weight Bearing: Non weight bearing Pain: Pain Assessment Pain Assessment: 0-10 Pain Score: 4  Pain  Type: Acute pain Pain Location: Shoulder Pain Orientation: Left Pain Descriptors / Indicators: Aching;Sore Pain Onset: On-going Patients Stated Pain Goal: 2 Pain Intervention(s): Rest;Repositioned Multiple Pain Sites: No   See Function Navigator for Current Functional Status.   Therapy/Group: Individual Therapy  Luberta Mutter 05/30/2015, 11:33 AM

## 2015-05-30 NOTE — Progress Notes (Signed)
Occupational Therapy Weekly Progress Note  Patient Details  Name: [name] MRN: [id number] Date of Birth: 01/11/1961  Beginning of progress report period: May 24, 2015 End of progress report period: May 30, 2015  Today's Date: 05/30/2015 OT Individual Time: 0539-7673 and 1345-1445 OT Individual Time Calculation (min): 72 min and 60 min   Patient has met 3 of 4 short term goals.  Pt is making steady progress towards goals.  Standing continues to be an obstacle due to Lt shoulder pain and BUE/BLE weakness, with pt requiring max assist for sit > stand.  Have educated pt on lateral leans for hygiene and clothing management as well as lateral scoots and slide board transfers to increase safety and independence with self-care tasks and mobility.    Patient continues to demonstrate the following deficits: decreased standing tolerance, BUE/BLE weakness, decreased activity tolerance and therefore will continue to benefit from skilled OT intervention to enhance overall performance with BADL, iADL and Reduce care partner burden.  Patient progressing toward long term goals..  Continue plan of care.  OT Short Term Goals Week 1:  OT Short Term Goal 1 (Week 1): Pt will complete perineal hygiene at sit > stand with mod assist OT Short Term Goal 1 - Progress (Week 1): Partly met OT Short Term Goal 2 (Week 1): Pt will complete LB dressing with mod assist OT Short Term Goal 2 - Progress (Week 1): Met OT Short Term Goal 3 (Week 1): Pt will complete toilet transfers with mod assist OT Short Term Goal 3 - Progress (Week 1): Met OT Short Term Goal 4 (Week 1): Pt will complete bathing with min assist OT Short Term Goal 4 - Progress (Week 1): Met Week 2:  OT Short Term Goal 1 (Week 2): Pt will complete 1 grooming task in standing OT Short Term Goal 2 (Week 2): Pt will complete perineal hygiene at sit > stand with mod assist OT Short Term Goal 3 (Week 2): Pt will complete toilet transfers with  supervision to drop arm BSC OT Short Term Goal 4 (Week 2): Pt will complete simple meal prep at w/c level with supervision  Skilled Therapeutic Interventions/Progress Updates:    1) ADL retraining with focus on toilet transfers, clothing management, and grooming tasks.  Pt finishing breakfast upon arrival.  Reports need to toilet.  Performed lateral scoot/squat pivot transfer to toilet with use of grab bars with steady assist.  Pt able to complete hygiene and clothing management with lateral leans and close supervision.  Mod assist squat pivot back to w/c due to uphill/over cushion.  Returned to bed to complete pulling pants up with reclining in bed, then returning to w/c.  Grooming completed w/c level at sink.  Pt propelled w/c to therapy gym, maneuvering obstacles and applying/removing leg rests with increased time.  Addressed trunk control with lateral leans on elevated mat (eliminating lower extremity support) with placing and retrieving items under alternating buttocks to further challenge sitting balance and strengthen core to improve hygiene and LB dressing at seated level.  Slide board transfers up incline with focus on proper placement of slide board and motor control with transfers.  2) Treatment session with focus on activity tolerance, BUE strengthening, sit <> stand and functional mobility in community environment. Pt propelled w/c from room to outside entrance of hospital, requiring cues for technique to negotiate elevator and doorways.  Min cues for safety with ramp outside and min assist when propelling back up ramp due to increase in  slope (from standard).  Discussed community barriers and engaged in problem solving discussion with pt requiring min-mod question cues.  Engaged in sit <> stand with focus on coming up to standing, maintaining standing during task, and controlled descent into sitting.  Progressively lowered mat table from 23" to 21" with pt requiring min to mod assist as mat was  lowered.  Pt unable to achieve standing without max-total assist from lower surface this session. Plan to attempt LB dressing at sit > stand level during next session.  Therapy Documentation Precautions:  Precautions Precautions: Fall Restrictions Weight Bearing Restrictions: Yes RLE Weight Bearing: Non weight bearing General:   Vital Signs: Therapy Vitals Temp: 97.7 F (36.5 C) Temp Source: Oral Pulse Rate: 80 Resp: 18 BP: (!) 142/81 mmHg Patient Position (if appropriate): Lying Oxygen Therapy SpO2: 98 % O2 Device: Not Delivered Pain:  Pt with no c/o pain  See Function Navigator for Current Functional Status.   Therapy/Group: Individual Therapy  Simonne Come 05/30/2015, 8:46 AM

## 2015-05-31 ENCOUNTER — Inpatient Hospital Stay (HOSPITAL_COMMUNITY): Payer: Medicare Other | Admitting: Occupational Therapy

## 2015-05-31 ENCOUNTER — Inpatient Hospital Stay (HOSPITAL_COMMUNITY): Payer: Medicare Other | Admitting: Physical Therapy

## 2015-05-31 DIAGNOSIS — M7552 Bursitis of left shoulder: Secondary | ICD-10-CM

## 2015-05-31 LAB — GLUCOSE, CAPILLARY
GLUCOSE-CAPILLARY: 105 mg/dL — AB (ref 65–99)
GLUCOSE-CAPILLARY: 124 mg/dL — AB (ref 65–99)
GLUCOSE-CAPILLARY: 160 mg/dL — AB (ref 65–99)
Glucose-Capillary: 109 mg/dL — ABNORMAL HIGH (ref 65–99)

## 2015-05-31 NOTE — Progress Notes (Signed)
Nutrition Follow-up  DOCUMENTATION CODES:   Obesity unspecified  INTERVENTION:  Continue providing high protein snack at bedtime.   Continue MVI daily.   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  Meal completion has been 100%. Intake has been adequate. RD to continue with bedtime snack. Continue to encourage adequate PO intake. Labs and medications reviewed.   Diet Order:  Diet Carb Modified Fluid consistency:: Thin; Room service appropriate?: Yes  Skin:  Wound (see comment) (Incision from recent R BKA)  Last BM:  10/13  Height:   Ht Readings from Last 1 Encounters:  05/23/15 _0  (1.803 m)    Weight:   Wt Readings from Last 1 Encounters:  05/29/15 257 lb 8 oz (116.801 kg)    Ideal Body Weight:  73.1 kg  BMI:  Body mass index is 35.93 kg/(m^2).  Estimated Nutritional Needs:   Kcal:  2100-2300  Protein:  130-145 grams  Fluid:  > 2.1 L/day  EDUCATION NEEDS:   No education needs identified at this time  Corrin Parker, MS, RD, LDN Pager # [phone] After hours/ weekend pager # [phone]

## 2015-05-31 NOTE — Progress Notes (Addendum)
Occupational Therapy Session Note  Patient Details  Name: [name] MRN: [id number] Date of Birth: May 15, 1961  Today's Date: 05/31/2015 OT Individual Time:  - 24-  1200  (30 min)      Short Term Goals: Week 1:  OT Short Term Goal 1 (Week 1): Pt will complete perineal hygiene at sit > stand with mod assist OT Short Term Goal 1 - Progress (Week 1): Partly met OT Short Term Goal 2 (Week 1): Pt will complete LB dressing with mod assist OT Short Term Goal 2 - Progress (Week 1): Met OT Short Term Goal 3 (Week 1): Pt will complete toilet transfers with mod assist OT Short Term Goal 3 - Progress (Week 1): Met OT Short Term Goal 4 (Week 1): Pt will complete bathing with min assist OT Short Term Goal 4 - Progress (Week 1): Met Week 2:  OT Short Term Goal 1 (Week 2): Pt will complete 1 grooming task in standing OT Short Term Goal 2 (Week 2): Pt will complete perineal hygiene at sit > stand with mod assist OT Short Term Goal 3 (Week 2): Pt will complete toilet transfers with supervision to drop arm BSC OT Short Term Goal 4 (Week 2): Pt will complete simple meal prep at w/c level with supervision  Skilled Therapeutic Interventions/Progress Updates:  Focus on functional transfers,.  Transferred from wc to mat.  Addressed LUE shoulder and pain.  Initially pain was 5/10  And went down to 3/10.  Performed lateral scoot transfer w/c > mat  with min assist to SBA.  Performed 5#  Weight to RUE.  DId AAROM to LUE.  Pt having pain in bicep tendon areas.  Applied ice for 5 min.  Pain lessened to 3/10 .  Educated ppt on performing shoulder depression when transferring from place to place and when rolling wc.  Pt returned demonstration of scapula depression exercise.    Therapy Documentation Precautions:  Precautions Precautions: Fall Restrictions Weight Bearing Restrictions: Yes RLE Weight Bearing: Non weight bearing      Pain:  5/10 initially; 3/10 after mobs and ice          See  Function Navigator for Current Functional Status.   Therapy/Group: Individual Therapy  Lisa Roca 05/31/2015, 7:57 AM

## 2015-05-31 NOTE — Progress Notes (Signed)
Occupational Therapy Session Note  Patient Details  Name: [name] MRN: [id number] Date of Birth: 09-Oct-1960  Today's Date: 05/31/2015 OT Individual Time: 8003-4917 OT Individual Time Calculation (min): 60 min    Short Term Goals: Week 2:  OT Short Term Goal 1 (Week 2): Pt will complete 1 grooming task in standing OT Short Term Goal 2 (Week 2): Pt will complete perineal hygiene at sit > stand with mod assist OT Short Term Goal 3 (Week 2): Pt will complete toilet transfers with supervision to drop arm BSC OT Short Term Goal 4 (Week 2): Pt will complete simple meal prep at w/c level with supervision  Skilled Therapeutic Interventions/Progress Updates:    ADL retraining with focus on sit > stand for perineal hygiene and LB dressing as well as residual limb care with education on wrapping residual limb.  Pt completed bathing at seated level at sink, transferring back to EOB to complete LB dressing.  Pt required elevated bed to approx 21" to achieve sit > stand with min assist with use of RW.  Pt tolerated standing 3 reps to complete LB dressing tasks with steady assist for balance only.  Educated on limb wrapping with therapist demonstrating figure 8 technique with pt able to return demonstrate with only min cues for technique to ensure no gaps.  Squat pivot transfers bed <> w/c steady assist with incline, supervision downhill.  Therapy Documentation Precautions:  Precautions Precautions: Fall Restrictions Weight Bearing Restrictions: Yes RLE Weight Bearing: Non weight bearing Pain:  Pt with no c/o pain  See Function Navigator for Current Functional Status.   Therapy/Group: Individual Therapy  Kayson Bullis, [place] 05/31/2015, 1:32 PM

## 2015-05-31 NOTE — Progress Notes (Addendum)
Subjective/Complaints: Shoulder pain improved after subacromial bursa injection. Is able to use the left upper extremity to propel wheelchair as well as use the walker  ROS: Negative neck pain, denies any bowel bladder dysfunction or breathing issues  Objective: Vital Signs: Blood pressure 128/63, pulse 59, temperature 97.8 F (36.6 C), temperature source Oral, resp. rate 18, height _0  (1.803 m), weight 116.801 kg (257 lb 8 oz), SpO2 99 %. No results found. Results for orders placed or performed during the hospital encounter of 05/23/15 (from the past 72 hour(s))  Glucose, capillary     Status: Abnormal   Collection Time: 05/28/15  8:52 PM  Result Value Ref Range   Glucose-Capillary 118 (H) 65 - 99 mg/dL  Glucose, capillary     Status: Abnormal   Collection Time: 05/29/15  6:19 AM  Result Value Ref Range   Glucose-Capillary 104 (H) 65 - 99 mg/dL  Glucose, capillary     Status: Abnormal   Collection Time: 05/29/15 12:11 PM  Result Value Ref Range   Glucose-Capillary 105 (H) 65 - 99 mg/dL  Glucose, capillary     Status: Abnormal   Collection Time: 05/29/15  4:41 PM  Result Value Ref Range   Glucose-Capillary 116 (H) 65 - 99 mg/dL  Glucose, capillary     Status: Abnormal   Collection Time: 05/29/15  7:55 PM  Result Value Ref Range   Glucose-Capillary 176 (H) 65 - 99 mg/dL  Glucose, capillary     Status: Abnormal   Collection Time: 05/30/15  6:27 AM  Result Value Ref Range   Glucose-Capillary 191 (H) 65 - 99 mg/dL  Glucose, capillary     Status: Abnormal   Collection Time: 05/30/15 11:55 AM  Result Value Ref Range   Glucose-Capillary 120 (H) 65 - 99 mg/dL  Glucose, capillary     Status: Abnormal   Collection Time: 05/30/15  4:36 PM  Result Value Ref Range   Glucose-Capillary 126 (H) 65 - 99 mg/dL  Glucose, capillary     Status: Abnormal   Collection Time: 05/30/15  8:54 PM  Result Value Ref Range   Glucose-Capillary 177 (H) 65 - 99 mg/dL  Glucose, capillary      Status: Abnormal   Collection Time: 05/31/15  6:38 AM  Result Value Ref Range   Glucose-Capillary 109 (H) 65 - 99 mg/dL   Comment 1 Notify RN   Glucose, capillary     Status: Abnormal   Collection Time: 05/31/15 11:42 AM  Result Value Ref Range   Glucose-Capillary 105 (H) 65 - 99 mg/dL  Glucose, capillary     Status: Abnormal   Collection Time: 05/31/15  4:30 PM  Result Value Ref Range   Glucose-Capillary 124 (H) 65 - 99 mg/dL     HEENT: normal and poor dentition Cardio: Irreg Irreg, no murmur Resp: CTA B/L and unlabored GI: BS positive and NT, ND Extremity:  Pulses positive and No Edema, Right BKA site unwrapped, incision line is clean, small open pink granulating areas on the medial and lateral aspects, No drainage Skin:   Intact Except as above Neuro: Alert/Oriented, Abnormal Sensory Reduced sensation Left foot and ankle to LT and Abnormal Motor 5/5 Bilateral deltoid, biceps, triceps, grip, left hip flexion 4 minus left knee extension 4, left ankle dorsi flexion plantar flexion 3 minus Musc/Skel:  Positive impingement sign left upper extremity at 90. No evidence of left upper extreme swelling no erythema. Gen. no acute distress, mood and affect are appropriate  Assessment/Plan: 1. Functional deficits secondary to right below-knee amputation secondary to peripheral vascular disease, diabetes mellitus with neuropathy which require 3+ hours per day of interdisciplinary therapy in a comprehensive inpatient rehab setting. Physiatrist is providing close team supervision and 24 hour management of active medical problems listed below. Physiatrist and rehab team continue to assess barriers to discharge/monitor patient progress toward functional and medical goals. Team conference today please see physician documentation under team conference tab, met with team face-to-face to discuss problems,progress, and goals. Formulized individual treatment plan based on medical history, underlying  problem and comorbidities. FIM: Function - Bathing Position: Wheelchair/chair at sink Body parts bathed by patient: Right arm, Left arm, Chest, Abdomen, Front perineal area, Right upper leg, Left upper leg, Left lower leg Body parts bathed by helper: Back Bathing not applicable: Buttocks, Right lower leg (Rt BKA) Assist Level: Touching or steadying assistance(Pt > 75%)  Function- Upper Body Dressing/Undressing What is the patient wearing?: Pull over shirt/dress Pull over shirt/dress - Perfomed by patient: Thread/unthread right sleeve, Thread/unthread left sleeve, Put head through opening, Pull shirt over trunk Pull over shirt/dress - Perfomed by helper: Pull shirt over trunk Assist Level: Set up Set up : To obtain clothing/put away Function - Lower Body Dressing/Undressing What is the patient wearing?: Underwear, Pants, Non-skid slipper socks, Shoes Position: Sitting EOB Underwear - Performed by patient: Thread/unthread right underwear leg, Thread/unthread left underwear leg, Pull underwear up/down Pants- Performed by patient: Thread/unthread right pants leg, Thread/unthread left pants leg, Pull pants up/down Pants- Performed by helper: Pull pants up/down, Thread/unthread left pants leg, Thread/unthread right pants leg Non-skid slipper socks- Performed by patient: Don/doff left sock (Rt BKA) Shoes - Performed by patient: Don/doff left shoe, Fasten left Assist for footwear: Setup Assist for lower body dressing: Touching or steadying assistance (Pt > 75%), Set up (At sit > stand level from EOB) Set up : To obtain clothing/put away  Function - Toileting Toileting activity did not occur: N/A Toileting steps completed by patient: Adjust clothing after toileting, Adjust clothing prior to toileting, Performs perineal hygiene Toileting steps completed by helper: Adjust clothing prior to toileting, Performs perineal hygiene, Adjust clothing after toileting Toileting Assistive Devices: Grab bar  or rail Assist level: Supervision or verbal cues  Function - Air cabin crew transfer activity did not occur: N/A Toilet transfer assistive device: Grab bar Assist level to toilet: Touching or steadying assistance (Pt > 75%) Assist level from toilet: Moderate assist (Pt 50 - 74%/lift or lower)  Function - Chair/bed transfer Chair/bed transfer method: Squat pivot Chair/bed transfer assist level: Touching or steadying assistance (Pt > 75%) Chair/bed transfer assistive device: Armrests Chair/bed transfer details: Verbal cues for technique  Function - Locomotion: Wheelchair Will patient use wheelchair at discharge?: Yes Type: Manual Max wheelchair distance: 150 Assist Level: Supervision or verbal cues Assist Level: Supervision or verbal cues Assist Level: Supervision or verbal cues Turns around,maneuvers to table,bed, and toilet,negotiates 3% grade,maneuvers on rugs and over doorsills: No Function - Locomotion: Ambulation Assistive device: Walker-rolling Max distance: 2 Assist level: Moderate assist (Pt 50 - 74%) Assist level: 2 helpers Walk 50 feet with 2 turns activity did not occur: Safety/medical concerns Walk 150 feet activity did not occur: Safety/medical concerns Walk 10 feet on uneven surfaces activity did not occur: Safety/medical concerns  Function - Comprehension Comprehension: Auditory Comprehension assist level: Follows complex conversation/direction with extra time/assistive device  Function - Expression Expression: Verbal Expression assist level: Expresses basic needs/ideas: With no assist  Function - Social Interaction  Social Interaction assist level: Interacts appropriately 90% of the time - Needs monitoring or encouragement for participation or interaction.  Function - Problem Solving Problem solving assist level: Solves basic problems with no assist  Function - Memory Memory assist level: More than reasonable amount of time Patient normally able  to recall (first 3 days only): Current season, Location of own room, Staff names and faces, That he or she is in a hospital  Medical Problem List and Plan: 1. Functional deficits secondary to right BKA secondary to PVD/ nonhealing ulcer 05/21/2015, DC Coban wrapping, use Kerlix and Ace wrap 2.  DVT Prophylaxis/Anticoagulation: Pradaxa 3. Pain Management: Hydrocodone and Robaxin as needed. Monitor with increased mobility,  4. Acute blood loss anemia. Follow-up CBC 5. Neuropsych: This patient is capable of making decisions on his own behalf. 6. Skin/Wound Care: Routine skin checks 7. Fluids/Electrolytes/Nutrition: Routine I&O, eating 100% meals and has good fluid intake 8. Wound culture Proteus Mirabalis. Continue doxycycline Cipro 2 weeks initiated 05/23/2015, monitor for diarrhea 9. Hypertension/atrial flutter with ablation/nonischemic cardiomyopathy. Continue Pradaxa, clonidine 0.2 mg twice a day, Lanoxin 0.125 mg daily, Lopressor 75 mg twice a day, Minipress 1 mg twice a day. Cardiac rate controlled, BP in good range 10. Chronic systolic congestive heart failure. Demadex 20 mg twice a day. Monitor for any signs of fluid overload 11. Diabetes mellitus of peripheral neuropathy. Hemoglobin A1c 9.0. NovoLog 6 units 3 times a day, Levemir 60 units bedtime. Check blood sugars before meals and at bedtime. Diabetic teaching,  CBGs increased yesterday following steroid injection but have fallen back into the normal range 12. Chronic renal insufficiency. Baseline creatinine 1.58. Follow-up chemistriesshow improvement of creatinine to 1.1 13. Hyperlipidemia. Lopid/Lipitor 14. History of gout. Zyloprim 300 mg daily. Thus far no evidence for gouty flareup 15.Left shoulder pain , improved after subacromial bursa injection  LOS (Days) 8 A FACE TO FACE EVALUATION WAS PERFORMED  KIRSTEINS,ANDREW E 05/31/2015, 4:58 PM

## 2015-05-31 NOTE — Progress Notes (Signed)
Physical Therapy Weekly Progress Note  Patient Details  Name: [name] MRN: [id number] Date of Birth: 30-Nov-1960  Beginning of progress report period: May 24, 2015 End of progress report period: May 31, 2015  Today's Date: 05/31/2015 PT Individual Time: 0800-0900 and 1430-1530 PT Individual Time Calculation (min): 60 min and 60 min (total 120 min)   Patient has met 2 of 5 short term goals. Patient has been limited with use or RW due to L shoulder pain, limiting progress toward unmet goals which include gait with RW, stand pivot transfer with RW, and one stair. Transfers are supervision overall with slide board and squat pivot transfer. Sit <>stand   Patient continues to demonstrate the following deficits: strength deficits, impaired sitting and standing balance, decreased activity tolerance and therefore will continue to benefit from skilled PT intervention to enhance overall performance with activity tolerance, balance, postural control and ability to compensate for deficits.  Patient progressing toward long term goals..  Continue plan of care.  PT Short Term Goals Week 1:  PT Short Term Goal 1 (Week 1): Will perform bed mobility supervision without use of bedrails PT Short Term Goal 1 - Progress (Week 1): Met PT Short Term Goal 2 (Week 1): Will perform sit <>supine with supervision PT Short Term Goal 2 - Progress (Week 1): Met PT Short Term Goal 3 (Week 1): Will perform stand pivot transfer w/c <> bed with RW and minA PT Short Term Goal 3 - Progress (Week 1): Not met PT Short Term Goal 4 (Week 1): Will perform gait x50' with RW and modA PT Short Term Goal 4 - Progress (Week 1): Not met PT Short Term Goal 5 (Week 1): Will perform ascent/descent of one 3-inch stair with modA PT Short Term Goal 5 - Progress (Week 1): Not met Week 2:  PT Short Term Goal 1 (Week 2): =LTG due to estimated LOS  Skilled Therapeutic Interventions/Progress Updates:    0800-0900: Pt received  supine in bed, c/o shoulder pain as described below and agreeable to treatment. Supine>sit with HOB flat and no bedrails to simulate home environment; requires increased time to perform and supervision due to impaired sitting balance. Car transfers x3 trials. Initial trial performed squat pivot and required modA due to inability to get w/c close enough to car for safe clearance. Second two trials performed with transfer board to level seat height and one transfer to elevated seat (3-4 inches higher than w/c). Supervision needed for both transfer board transfers, and min verbal cues for board placement. Educated pt on head/hips relationship, awareness of skin integrity due to poor clearance of ischial tuberosities. Pt too fatigued and declines additional practice with transfers at this time. Nustep x10 min on level 5 with BUE/LLE and RLE supported on stool. Several short rest breaks due to fatigue. Squat pivot w/c <>Nustep with supervision. Propelled back to room with BUEs and remained seated in w/c with all needs in reach at completion of session.  1430-1530: Pt received seated in w/c with c/o shoulder pain as below, unchanged from morning session. W/c propulsion to/from therapy gym with supervision. Sit <>stand and standing balance with RW and dynamic UE reaching to retrieve cards from mat table behind pt and match in front on board at waist level and overhead. Requires several seated rest breaks due to fatigue and L sided back discomfort associated with L lateral weight shift onto weak LLE with poor hip stability. Pt notes increased difficulty/fear with reaching behind to R side due  to missing RLE for support, however improved safety and confidence with several repetitions. Seated LE/core strengthening exercises with 3# cuff weights including hip flexion, long arc quad, hip adduction isometric, as well as core strengthening exercises situps from semi-reclined position on wedge. Supine straight leg raise and  bridging on bolster 3 sets 10 reps. Transfer w/c <> mat table x 4 reps focusing on head/hips relationship with minA, tactile cues for positioning. Improved clearance over w/c wheel after second attempt. Pt returned to room and remained seated in w/c with all needs in reach at completion of session.   Therapy Documentation Precautions:  Precautions Precautions: Fall Restrictions Weight Bearing Restrictions: Yes RLE Weight Bearing: Non weight bearing Pain: Pain Assessment Pain Assessment: 0-10 Pain Score: 5  Pain Type: Acute pain Pain Location: Shoulder Pain Orientation: Left Pain Descriptors / Indicators: Aching Pain Onset: On-going Patients Stated Pain Goal: 2 Pain Intervention(s): Repositioned Multiple Pain Sites: No   See Function Navigator for Current Functional Status.  Therapy/Group: Individual Therapy  Luberta Mutter 05/31/2015, 8:51 AM

## 2015-06-01 ENCOUNTER — Inpatient Hospital Stay (HOSPITAL_COMMUNITY): Payer: Medicare Other | Admitting: Physical Therapy

## 2015-06-01 DIAGNOSIS — M7542 Impingement syndrome of left shoulder: Secondary | ICD-10-CM

## 2015-06-01 LAB — GLUCOSE, CAPILLARY
GLUCOSE-CAPILLARY: 64 mg/dL — AB (ref 65–99)
GLUCOSE-CAPILLARY: 73 mg/dL (ref 65–99)
GLUCOSE-CAPILLARY: 110 mg/dL — AB (ref 65–99)
Glucose-Capillary: 126 mg/dL — ABNORMAL HIGH (ref 65–99)
Glucose-Capillary: 127 mg/dL — ABNORMAL HIGH (ref 65–99)

## 2015-06-01 NOTE — Progress Notes (Signed)
Subjective/Complaints: Shoulder pain improved after subacromial bursa injection.still has some pain with overhead activity Is able to use the left upper extremity to propel wheelchair as well as use the walker  ROS: Negative neck pain, denies any bowel bladder dysfunction or breathing issues  Objective: Vital Signs: Blood pressure 144/84, pulse 67, temperature 97.5 F (36.4 C), temperature source Oral, resp. rate 20, height 5\' 11"  (1.803 m), weight 116.801 kg (257 lb 8 oz), SpO2 99 %. No results found. Results for orders placed or performed during the hospital encounter of 05/23/15 (from the past 72 hour(s))  Glucose, capillary     Status: Abnormal   Collection Time: 05/29/15  4:41 PM  Result Value Ref Range   Glucose-Capillary 116 (H) 65 - 99 mg/dL  Glucose, capillary     Status: Abnormal   Collection Time: 05/29/15  7:55 PM  Result Value Ref Range   Glucose-Capillary 176 (H) 65 - 99 mg/dL  Glucose, capillary     Status: Abnormal   Collection Time: 05/30/15  6:27 AM  Result Value Ref Range   Glucose-Capillary 191 (H) 65 - 99 mg/dL  Glucose, capillary     Status: Abnormal   Collection Time: 05/30/15 11:55 AM  Result Value Ref Range   Glucose-Capillary 120 (H) 65 - 99 mg/dL  Glucose, capillary     Status: Abnormal   Collection Time: 05/30/15  4:36 PM  Result Value Ref Range   Glucose-Capillary 126 (H) 65 - 99 mg/dL  Glucose, capillary     Status: Abnormal   Collection Time: 05/30/15  8:54 PM  Result Value Ref Range   Glucose-Capillary 177 (H) 65 - 99 mg/dL  Glucose, capillary     Status: Abnormal   Collection Time: 05/31/15  6:38 AM  Result Value Ref Range   Glucose-Capillary 109 (H) 65 - 99 mg/dL   Comment 1 Notify RN   Glucose, capillary     Status: Abnormal   Collection Time: 05/31/15 11:42 AM  Result Value Ref Range   Glucose-Capillary 105 (H) 65 - 99 mg/dL  Glucose, capillary     Status: Abnormal   Collection Time: 05/31/15  4:30 PM  Result Value Ref Range    Glucose-Capillary 124 (H) 65 - 99 mg/dL  Glucose, capillary     Status: Abnormal   Collection Time: 05/31/15  8:27 PM  Result Value Ref Range   Glucose-Capillary 160 (H) 65 - 99 mg/dL  Glucose, capillary     Status: Abnormal   Collection Time: 06/01/15  6:41 AM  Result Value Ref Range   Glucose-Capillary 64 (L) 65 - 99 mg/dL   Comment 1 Notify RN   Glucose, capillary     Status: None   Collection Time: 06/01/15  7:06 AM  Result Value Ref Range   Glucose-Capillary 73 65 - 99 mg/dL   Comment 1 Notify RN   Glucose, capillary     Status: Abnormal   Collection Time: 06/01/15 11:34 AM  Result Value Ref Range   Glucose-Capillary 110 (H) 65 - 99 mg/dL     HEENT: normal and poor dentition Cardio: Irreg Irreg, no murmur Resp: CTA B/L and unlabored GI: BS positive and NT, ND Extremity:  Pulses positive and No Edema, Right BKA site unwrapped, incision line is clean, small open pink granulating areas on the medial and lateral aspects, No drainage Skin:   Intact Except as above Neuro: Alert/Oriented, Abnormal Sensory Reduced sensation Left foot and ankle to LT and Abnormal Motor 5/5 Bilateral deltoid,  biceps, triceps, grip, left hip flexion 4 minus left knee extension 4, left ankle dorsi flexion plantar flexion 3 minus Musc/Skel:  Positive impingement sign left upper extremity at 90. No evidence of left upper extreme swelling no erythema. Gen. no acute distress, mood and affect are appropriate   Assessment/Plan: 1. Functional deficits secondary to right below-knee amputation secondary to peripheral vascular disease, diabetes mellitus with neuropathy which require 3+ hours per day of interdisciplinary therapy in a comprehensive inpatient rehab setting. Physiatrist is providing close team supervision and 24 hour management of active medical problems listed below. Physiatrist and rehab team continue to assess barriers to discharge/monitor patient progress toward functional and medical  goals.  FIM: Function - Bathing Position: Wheelchair/chair at sink Body parts bathed by patient: Right arm, Left arm, Chest, Abdomen, Front perineal area, Right upper leg, Left upper leg, Left lower leg Body parts bathed by helper: Back Bathing not applicable: Buttocks, Right lower leg (Rt BKA) Assist Level: Touching or steadying assistance(Pt > 75%)  Function- Upper Body Dressing/Undressing What is the patient wearing?: Pull over shirt/dress Pull over shirt/dress - Perfomed by patient: Thread/unthread right sleeve, Thread/unthread left sleeve, Put head through opening, Pull shirt over trunk Pull over shirt/dress - Perfomed by helper: Pull shirt over trunk Assist Level: Set up Set up : To obtain clothing/put away Function - Lower Body Dressing/Undressing What is the patient wearing?: Underwear, Pants, Non-skid slipper socks, Shoes Position: Sitting EOB Underwear - Performed by patient: Thread/unthread right underwear leg, Thread/unthread left underwear leg, Pull underwear up/down Pants- Performed by patient: Thread/unthread right pants leg, Thread/unthread left pants leg, Pull pants up/down Pants- Performed by helper: Pull pants up/down, Thread/unthread left pants leg, Thread/unthread right pants leg Non-skid slipper socks- Performed by patient: Don/doff left sock (Rt BKA) Shoes - Performed by patient: Don/doff left shoe, Fasten left Assist for footwear: Setup Assist for lower body dressing: Touching or steadying assistance (Pt > 75%), Set up (At sit > stand level from EOB) Set up : To obtain clothing/put away  Function - Toileting Toileting activity did not occur: N/A Toileting steps completed by patient: Adjust clothing after toileting, Adjust clothing prior to toileting, Performs perineal hygiene Toileting steps completed by helper: Adjust clothing prior to toileting, Performs perineal hygiene, Adjust clothing after toileting Toileting Assistive Devices: Grab bar or rail Assist  level: Supervision or verbal cues  Function - Air cabin crew transfer activity did not occur: N/A Toilet transfer assistive device: Grab bar Assist level to toilet: Touching or steadying assistance (Pt > 75%) Assist level from toilet: Moderate assist (Pt 50 - 74%/lift or lower)  Function - Chair/bed transfer Chair/bed transfer method: Squat pivot Chair/bed transfer assist level: Touching or steadying assistance (Pt > 75%) Chair/bed transfer assistive device: Armrests Chair/bed transfer details: Verbal cues for technique  Function - Locomotion: Wheelchair Will patient use wheelchair at discharge?: Yes Type: Manual Max wheelchair distance: 150 Assist Level: Supervision or verbal cues Assist Level: Supervision or verbal cues Assist Level: Supervision or verbal cues Turns around,maneuvers to table,bed, and toilet,negotiates 3% grade,maneuvers on rugs and over doorsills: No Function - Locomotion: Ambulation Assistive device: Walker-rolling Max distance: 2 Assist level: Moderate assist (Pt 50 - 74%) Assist level: 2 helpers Walk 50 feet with 2 turns activity did not occur: Safety/medical concerns Walk 150 feet activity did not occur: Safety/medical concerns Walk 10 feet on uneven surfaces activity did not occur: Safety/medical concerns  Function - Comprehension Comprehension: Auditory Comprehension assist level: Follows complex conversation/direction with no assist  Function -  Expression Expression: Verbal Expression assist level: Expresses basic needs/ideas: With no assist  Function - Social Interaction Social Interaction assist level: Interacts appropriately with others - No medications needed.  Function - Problem Solving Problem solving assist level: Solves basic problems with no assist  Function - Memory Memory assist level: More than reasonable amount of time Patient normally able to recall (first 3 days only): Current season, Location of own room, Staff names  and faces, That he or she is in a hospital  Medical Problem List and Plan: 1. Functional deficits secondary to right BKA secondary to PVD/ nonhealing ulcer 05/21/2015, DC Coban wrapping, use Kerlix and Ace wrap 2.  DVT Prophylaxis/Anticoagulation: Pradaxa 3. Pain Management: Hydrocodone and Robaxin as needed. Monitor with increased mobility,  4. Acute blood loss anemia. Follow-up CBC 5. Neuropsych: This patient is capable of making decisions on his own behalf. 6. Skin/Wound Care: Routine skin checks 7. Fluids/Electrolytes/Nutrition: Routine I&O, eating 100% meals and has good fluid intake 8. Wound culture Proteus Mirabalis. Continue doxycycline Cipro 2 weeks initiated 05/23/2015, monitor for diarrhea 9. Hypertension/atrial flutter with ablation/nonischemic cardiomyopathy. Continue Pradaxa, clonidine 0.2 mg twice a day, Lanoxin 0.125 mg daily, Lopressor 75 mg twice a day, Minipress 1 mg twice a day. Cardiac rate controlled, BP in good range 10. Chronic systolic congestive heart failure. Demadex 20 mg twice a day. Monitor for any signs of fluid overload, no activity intolerance, only mild peripheral edema 11. Diabetes mellitus of peripheral neuropathy. Hemoglobin A1c 9.0. NovoLog 6 units 3 times a day, Levemir 60 units bedtime. Check blood sugars before meals and at bedtime. Diabetic teaching,  CBGs increased yesterday following steroid injection but have fallen back into the normal range, low blood sugar this morning will reduce Levemir 12. Chronic renal insufficiency. Baseline creatinine 1.58. Follow-up chemistriesshow improvement of creatinine to 1.1 13. Hyperlipidemia. Lopid/Lipitor 14. History of gout. Zyloprim 300 mg daily. Thus far no evidence for gouty flareup 15.Left shoulder pain , improved after subacromial bursa injection, we will need to build up shoulder external rotators  LOS (Days) 9 A FACE TO FACE EVALUATION WAS PERFORMED  KIRSTEINS,ANDREW E 06/01/2015, 12:24 PM

## 2015-06-01 NOTE — Progress Notes (Signed)
Physical Therapy Session Note  Patient Details  Name: [name] MRN: [id number] Date of Birth: Apr 10, 1961  Today's Date: 06/01/2015 PT Individual Time: 6546-5035 PT Individual Time Calculation (min): 60 min   Short Term Goals: Week 1:  PT Short Term Goal 1 (Week 1): Will perform bed mobility supervision without use of bedrails PT Short Term Goal 1 - Progress (Week 1): Met PT Short Term Goal 2 (Week 1): Will perform sit <>supine with supervision PT Short Term Goal 2 - Progress (Week 1): Met PT Short Term Goal 3 (Week 1): Will perform stand pivot transfer w/c <> bed with RW and minA PT Short Term Goal 3 - Progress (Week 1): Not met PT Short Term Goal 4 (Week 1): Will perform gait x50' with RW and modA PT Short Term Goal 4 - Progress (Week 1): Not met PT Short Term Goal 5 (Week 1): Will perform ascent/descent of one 3-inch stair with modA PT Short Term Goal 5 - Progress (Week 1): Not met  Skilled Therapeutic Interventions/Progress Updates:  Pt was seen bedside in the pm. Pt propelled w/c to gym with B UEs and S about 100 feet. Pt performed R knee flex/ext and quad sets, 3 sets x 10 reps each. Pt transferred w/c to edge of mat squat pivot with S and verbal cues. Pt transferred edge of mat to supine with S and increased time. Pt performed heel slides, hip abd/add, SAQs, and SLRs, 3 sets x 10 reps each. Pt transferred supine to edge of mat with S. Pt transferred edge of mat to w/c with S and verbal cues. Pt propelled w/c back to room with B UEs and S. Pt left sitting up in w/c with call bel within reach.   Therapy Documentation Precautions:  Precautions Precautions: Fall Restrictions Weight Bearing Restrictions: Yes RLE Weight Bearing: Non weight bearing General:   Pain: Pt c/o 5/10 pain in L shoulder.   See Function Navigator for Current Functional Status.   Therapy/Group: Individual Therapy  Dub Amis 06/01/2015, 3:50 PM

## 2015-06-01 NOTE — Progress Notes (Signed)
Hypoglycemic Event  CBG:64 Treatment: 15 GM carbohydrate snack  Symptoms: None  Follow-up CBG: Time:0706 CBG Result:73 Possible Reasons for Event: Unknown  Comments/MD notified:noted on comment section for MD    [name], [employer]

## 2015-06-02 ENCOUNTER — Inpatient Hospital Stay (HOSPITAL_COMMUNITY): Payer: Medicare Other | Admitting: Occupational Therapy

## 2015-06-02 DIAGNOSIS — Z8679 Personal history of other diseases of the circulatory system: Secondary | ICD-10-CM

## 2015-06-02 LAB — GLUCOSE, CAPILLARY
GLUCOSE-CAPILLARY: 181 mg/dL — AB (ref 65–99)
Glucose-Capillary: 83 mg/dL (ref 65–99)
Glucose-Capillary: 85 mg/dL (ref 65–99)
Glucose-Capillary: 100 mg/dL — ABNORMAL HIGH (ref 65–99)
Glucose-Capillary: 166 mg/dL — ABNORMAL HIGH (ref 65–99)

## 2015-06-02 MED ORDER — INSULIN DETEMIR 100 UNIT/ML ~~LOC~~ SOLN
53.0000 [IU] | Freq: Every day | SUBCUTANEOUS | Status: DC
  Administered 2015-06-02 – 2015-06-03 (×2): 53 [IU] via SUBCUTANEOUS
  Filled 2015-06-02 (×3): qty 0.53

## 2015-06-02 MED ORDER — HYDROCERIN EX CREA
TOPICAL_CREAM | Freq: Two times a day (BID) | CUTANEOUS | Status: DC
  Administered 2015-06-02: 1 via TOPICAL
  Administered 2015-06-03: 10:00:00 via TOPICAL
  Administered 2015-06-03: 1 via TOPICAL
  Administered 2015-06-04: 09:00:00 via TOPICAL
  Administered 2015-06-04 – 2015-06-05 (×2): 1 via TOPICAL
  Administered 2015-06-05 – 2015-06-06 (×3): via TOPICAL
  Filled 2015-06-02: qty 113

## 2015-06-02 NOTE — Progress Notes (Signed)
Occupational Therapy Session Note  Patient Details  Name: [name] MRN: [id number] Date of Birth: 08/20/1960  Today's Date: 06/02/2015 OT Individual Time: 1110-1210 OT Individual Time Calculation (min): 60 min    Short Term Goals: Week 1:  OT Short Term Goal 1 (Week 1): Pt will complete perineal hygiene at sit > stand with mod assist OT Short Term Goal 1 - Progress (Week 1): Partly met OT Short Term Goal 2 (Week 1): Pt will complete LB dressing with mod assist OT Short Term Goal 2 - Progress (Week 1): Met OT Short Term Goal 3 (Week 1): Pt will complete toilet transfers with mod assist OT Short Term Goal 3 - Progress (Week 1): Met OT Short Term Goal 4 (Week 1): Pt will complete bathing with min assist OT Short Term Goal 4 - Progress (Week 1): Met Week 2:  OT Short Term Goal 1 (Week 2): Pt will complete 1 grooming task in standing OT Short Term Goal 2 (Week 2): Pt will complete perineal hygiene at sit > stand with mod assist OT Short Term Goal 3 (Week 2): Pt will complete toilet transfers with supervision to drop arm BSC OT Short Term Goal 4 (Week 2): Pt will complete simple meal prep at w/c level with supervision  Skilled Therapeutic Interventions/Progress Updates: Patient participated in b/d edge of bed with focus on balance during lateral leans for peribathing and dressing.  He was reluctant to work on balance activities static or dynamic at walker - so he did not.   Patient also completed endurance activities EOB & tolerated the sessio well,     Therapy Documentation Precautions:  Precautions Precautions: Fall Restrictions Weight Bearing Restrictions: Yes RLE Weight Bearing: Non weight bearing   Pain:denied     See Function Navigator for Current Functional Status.   Therapy/Group: Individual Therapy  Alfredia Ferguson [facility] 06/02/2015, 3:25 PM

## 2015-06-02 NOTE — Progress Notes (Signed)
Subjective/Complaints: Shoulder pain improved after subacromial bursa injection.  Is able to use the left upper extremity to propel wheelchair as well as use the walker Dressing changed right lower extremity yesterday No bowel or bladder issues reported ROS: Negative neck pain, denies any bowel bladder dysfunction or breathing issues  Objective: Vital Signs: Blood pressure 138/79, pulse 72, temperature 97.7 F (36.5 C), temperature source Oral, resp. rate 18, height 5\' 11"  (1.803 m), weight 116.801 kg (257 lb 8 oz), SpO2 100 %. No results found. Results for orders placed or performed during the hospital encounter of 05/23/15 (from the past 72 hour(s))  Glucose, capillary     Status: Abnormal   Collection Time: 05/30/15 11:55 AM  Result Value Ref Range   Glucose-Capillary 120 (H) 65 - 99 mg/dL  Glucose, capillary     Status: Abnormal   Collection Time: 05/30/15  4:36 PM  Result Value Ref Range   Glucose-Capillary 126 (H) 65 - 99 mg/dL  Glucose, capillary     Status: Abnormal   Collection Time: 05/30/15  8:54 PM  Result Value Ref Range   Glucose-Capillary 177 (H) 65 - 99 mg/dL  Glucose, capillary     Status: Abnormal   Collection Time: 05/31/15  6:38 AM  Result Value Ref Range   Glucose-Capillary 109 (H) 65 - 99 mg/dL   Comment 1 Notify RN   Glucose, capillary     Status: Abnormal   Collection Time: 05/31/15 11:42 AM  Result Value Ref Range   Glucose-Capillary 105 (H) 65 - 99 mg/dL  Glucose, capillary     Status: Abnormal   Collection Time: 05/31/15  4:30 PM  Result Value Ref Range   Glucose-Capillary 124 (H) 65 - 99 mg/dL  Glucose, capillary     Status: Abnormal   Collection Time: 05/31/15  8:27 PM  Result Value Ref Range   Glucose-Capillary 160 (H) 65 - 99 mg/dL  Glucose, capillary     Status: Abnormal   Collection Time: 06/01/15  6:41 AM  Result Value Ref Range   Glucose-Capillary 64 (L) 65 - 99 mg/dL   Comment 1 Notify RN   Glucose, capillary     Status: None    Collection Time: 06/01/15  7:06 AM  Result Value Ref Range   Glucose-Capillary 73 65 - 99 mg/dL   Comment 1 Notify RN   Glucose, capillary     Status: Abnormal   Collection Time: 06/01/15 11:34 AM  Result Value Ref Range   Glucose-Capillary 110 (H) 65 - 99 mg/dL  Glucose, capillary     Status: Abnormal   Collection Time: 06/01/15  4:42 PM  Result Value Ref Range   Glucose-Capillary 126 (H) 65 - 99 mg/dL  Glucose, capillary     Status: Abnormal   Collection Time: 06/01/15  8:36 PM  Result Value Ref Range   Glucose-Capillary 127 (H) 65 - 99 mg/dL   Comment 1 Notify RN   Glucose, capillary     Status: None   Collection Time: 06/02/15  6:18 AM  Result Value Ref Range   Glucose-Capillary 83 65 - 99 mg/dL  Glucose, capillary     Status: None   Collection Time: 06/02/15  7:17 AM  Result Value Ref Range   Glucose-Capillary 85 65 - 99 mg/dL     HEENT: normal and poor dentition Cardio: Irreg Irreg, no murmur Resp: CTA B/L and unlabored GI: BS positive and NT, ND Extremity:  Pulses positive and , Right BKA site unwrapped, incision  line is clean, small open pink granulating areas on the medial and lateral aspects, No drainage, mild stump edema Skin:   Intact Except as above Neuro: Alert/Oriented, Abnormal Sensory Reduced sensation Left foot and ankle to LT and Abnormal Motor 5/5 Bilateral deltoid, biceps, triceps, grip, left hip flexion 4 minus left knee extension 4, left ankle dorsi flexion plantar flexion 3 minus Musc/Skel:  Positive impingement sign left upper extremity at 90. No evidence of left upper extreme swelling no erythema. Gen. no acute distress, mood and affect are appropriate   Assessment/Plan: 1. Functional deficits secondary to right below-knee amputation secondary to peripheral vascular disease, diabetes mellitus with neuropathy which require 3+ hours per day of interdisciplinary therapy in a comprehensive inpatient rehab setting. Physiatrist is providing close team  supervision and 24 hour management of active medical problems listed below. Physiatrist and rehab team continue to assess barriers to discharge/monitor patient progress toward functional and medical goals.  FIM: Function - Bathing Position: Wheelchair/chair at sink Body parts bathed by patient: Right arm, Left arm, Chest, Abdomen, Front perineal area, Right upper leg, Left upper leg, Left lower leg Body parts bathed by helper: Back Bathing not applicable: Buttocks, Right lower leg (Rt BKA) Assist Level: Touching or steadying assistance(Pt > 75%)  Function- Upper Body Dressing/Undressing What is the patient wearing?: Pull over shirt/dress Pull over shirt/dress - Perfomed by patient: Thread/unthread right sleeve, Thread/unthread left sleeve, Put head through opening, Pull shirt over trunk Pull over shirt/dress - Perfomed by helper: Pull shirt over trunk Assist Level: Set up Set up : To obtain clothing/put away Function - Lower Body Dressing/Undressing What is the patient wearing?: Underwear, Pants, Non-skid slipper socks, Shoes Position: Sitting EOB Underwear - Performed by patient: Thread/unthread right underwear leg, Thread/unthread left underwear leg, Pull underwear up/down Pants- Performed by patient: Thread/unthread right pants leg, Thread/unthread left pants leg, Pull pants up/down Pants- Performed by helper: Pull pants up/down, Thread/unthread left pants leg, Thread/unthread right pants leg Non-skid slipper socks- Performed by patient: Don/doff left sock (Rt BKA) Shoes - Performed by patient: Don/doff left shoe, Fasten left Assist for footwear: Setup Assist for lower body dressing: Touching or steadying assistance (Pt > 75%), Set up (At sit > stand level from EOB) Set up : To obtain clothing/put away  Function - Toileting Toileting activity did not occur: N/A Toileting steps completed by patient: Adjust clothing after toileting, Adjust clothing prior to toileting, Performs  perineal hygiene Toileting steps completed by helper: Adjust clothing prior to toileting, Performs perineal hygiene, Adjust clothing after toileting Toileting Assistive Devices: Grab bar or rail Assist level: Supervision or verbal cues  Function - Air cabin crew transfer activity did not occur: N/A Toilet transfer assistive device: Grab bar Assist level to toilet: Touching or steadying assistance (Pt > 75%) Assist level from toilet: Moderate assist (Pt 50 - 74%/lift or lower)  Function - Chair/bed transfer Chair/bed transfer method: Squat pivot Chair/bed transfer assist level: Supervision or verbal cues Chair/bed transfer assistive device: Armrests Chair/bed transfer details: Verbal cues for technique  Function - Locomotion: Wheelchair Will patient use wheelchair at discharge?: Yes Type: Manual Max wheelchair distance: 100 Assist Level: Supervision or verbal cues Assist Level: Supervision or verbal cues Assist Level: Supervision or verbal cues Turns around,maneuvers to table,bed, and toilet,negotiates 3% grade,maneuvers on rugs and over doorsills: No Function - Locomotion: Ambulation Assistive device: Walker-rolling Max distance: 2 Assist level: Moderate assist (Pt 50 - 74%) Assist level: 2 helpers Walk 50 feet with 2 turns activity did not  occur: Safety/medical concerns Walk 150 feet activity did not occur: Safety/medical concerns Walk 10 feet on uneven surfaces activity did not occur: Safety/medical concerns  Function - Comprehension Comprehension: Auditory Comprehension assist level: Follows complex conversation/direction with no assist  Function - Expression Expression: Verbal Expression assist level: Expresses basic needs/ideas: With no assist  Function - Social Interaction Social Interaction assist level: Interacts appropriately with others - No medications needed.  Function - Problem Solving Problem solving assist level: Solves basic problems with no  assist  Function - Memory Memory assist level: More than reasonable amount of time Patient normally able to recall (first 3 days only): Current season, Location of own room, Staff names and faces, That he or she is in a hospital  Medical Problem List and Plan: 1. Functional deficits secondary to right BKA secondary to PVD/ nonhealing ulcer 05/21/2015, DC Coban wrapping, use Kerlix and Ace wrap 2.  DVT Prophylaxis/Anticoagulation: Pradaxa 3. Pain Management: Hydrocodone and Robaxin as needed. Monitor with increased mobility,  4. Acute blood loss anemia. Follow-up CBC 5. Neuropsych: This patient is capable of making decisions on his own behalf. 6. Skin/Wound Care: Routine skin checks, BKA wound is healing well without evidence of drainage 7. Fluids/Electrolytes/Nutrition: Routine I&O, eating 100% meals and has good fluid intake 8. Wound culture Proteus Mirabalis. Continue doxycycline Cipro 2 weeks initiated 05/23/2015, monitor for diarrhea 9. Hypertension/atrial flutter with ablation/nonischemic cardiomyopathy. Continue Pradaxa, clonidine 0.2 mg twice a day, Lanoxin 0.125 mg daily, Lopressor 75 mg twice a day, Minipress 1 mg twice a day. Cardiac rate controlled, BP in good range 10. Chronic systolic congestive heart failure. Demadex 20 mg twice a day. Monitor for any signs of fluid overload, no activity intolerance, only mild peripheral edema. Fluid intake 720 mL, output 1820 mL 11. Diabetes mellitus of peripheral neuropathy. Hemoglobin A1c 9.0. NovoLog 6 units 3 times a day, Levemir 60 units bedtime.CBGs running a bit low in the mornings, reduce Levemir Check blood sugars before meals and at bedtime. Diabetic teaching,  CBGs increased yesterday following steroid injection but have fallen back into the normal range, 12. Chronic renal insufficiency. Baseline creatinine 1.58. Follow-up chemistriesshow improvement of creatinine to 1.1 13. Hyperlipidemia. Lopid/Lipitor 14. History of gout. Zyloprim  300 mg daily. Thus far no evidence for gouty flareup 15.Left shoulder pain , improved after subacromial bursa injection, we will need to build up shoulder external rotators  LOS (Days) 10 A FACE TO FACE EVALUATION WAS PERFORMED  Claudius Mich E 06/02/2015, 11:17 AM

## 2015-06-03 ENCOUNTER — Inpatient Hospital Stay (HOSPITAL_COMMUNITY): Payer: Medicare Other | Admitting: Physical Therapy

## 2015-06-03 ENCOUNTER — Inpatient Hospital Stay (HOSPITAL_COMMUNITY): Payer: Medicare Other | Admitting: Occupational Therapy

## 2015-06-03 LAB — GLUCOSE, CAPILLARY
Glucose-Capillary: 90 mg/dL (ref 65–99)
Glucose-Capillary: 101 mg/dL — ABNORMAL HIGH (ref 65–99)
Glucose-Capillary: 105 mg/dL — ABNORMAL HIGH (ref 65–99)
Glucose-Capillary: 158 mg/dL — ABNORMAL HIGH (ref 65–99)

## 2015-06-03 NOTE — Progress Notes (Signed)
Subjective/Complaints: Shoulder pain improved after subacromial bursa injection.   ROS: Negative neck pain, denies any bowel bladder dysfunction or breathing issues  Objective: Vital Signs: Blood pressure 128/73, pulse 77, temperature 97.9 F (36.6 C), temperature source Oral, resp. rate 18, height 5\' 11"  (1.803 m), weight 116.801 kg (257 lb 8 oz), SpO2 99 %. No results found. Results for orders placed or performed during the hospital encounter of 05/23/15 (from the past 72 hour(s))  Glucose, capillary     Status: Abnormal   Collection Time: 05/31/15 11:42 AM  Result Value Ref Range   Glucose-Capillary 105 (H) 65 - 99 mg/dL  Glucose, capillary     Status: Abnormal   Collection Time: 05/31/15  4:30 PM  Result Value Ref Range   Glucose-Capillary 124 (H) 65 - 99 mg/dL  Glucose, capillary     Status: Abnormal   Collection Time: 05/31/15  8:27 PM  Result Value Ref Range   Glucose-Capillary 160 (H) 65 - 99 mg/dL  Glucose, capillary     Status: Abnormal   Collection Time: 06/01/15  6:41 AM  Result Value Ref Range   Glucose-Capillary 64 (L) 65 - 99 mg/dL   Comment 1 Notify RN   Glucose, capillary     Status: None   Collection Time: 06/01/15  7:06 AM  Result Value Ref Range   Glucose-Capillary 73 65 - 99 mg/dL   Comment 1 Notify RN   Glucose, capillary     Status: Abnormal   Collection Time: 06/01/15 11:34 AM  Result Value Ref Range   Glucose-Capillary 110 (H) 65 - 99 mg/dL  Glucose, capillary     Status: Abnormal   Collection Time: 06/01/15  4:42 PM  Result Value Ref Range   Glucose-Capillary 126 (H) 65 - 99 mg/dL  Glucose, capillary     Status: Abnormal   Collection Time: 06/01/15  8:36 PM  Result Value Ref Range   Glucose-Capillary 127 (H) 65 - 99 mg/dL   Comment 1 Notify RN   Glucose, capillary     Status: None   Collection Time: 06/02/15  6:18 AM  Result Value Ref Range   Glucose-Capillary 83 65 - 99 mg/dL  Glucose, capillary     Status: None   Collection Time:  06/02/15  7:17 AM  Result Value Ref Range   Glucose-Capillary 85 65 - 99 mg/dL  Glucose, capillary     Status: Abnormal   Collection Time: 06/02/15 11:36 AM  Result Value Ref Range   Glucose-Capillary 100 (H) 65 - 99 mg/dL   Comment 1 Notify RN   Glucose, capillary     Status: Abnormal   Collection Time: 06/02/15  4:53 PM  Result Value Ref Range   Glucose-Capillary 166 (H) 65 - 99 mg/dL   Comment 1 Notify RN   Glucose, capillary     Status: Abnormal   Collection Time: 06/02/15  9:12 PM  Result Value Ref Range   Glucose-Capillary 181 (H) 65 - 99 mg/dL  Glucose, capillary     Status: None   Collection Time: 06/03/15  6:43 AM  Result Value Ref Range   Glucose-Capillary 90 65 - 99 mg/dL     HEENT: normal and poor dentition Cardio: Irreg Irreg, no murmur Resp: CTA B/L and unlabored GI: BS positive and NT, ND Extremity:  Pulses positive and , Right BKA site unwrapped, incision line is clean, small open pink granulating areas on the medial and lateral aspects, No drainage, mild stump edema Skin:  Intact Except as above Neuro: Alert/Oriented, Abnormal Sensory Reduced sensation Left foot and ankle to LT and Abnormal Motor 5/5 Bilateral deltoid, biceps, triceps, grip, left hip flexion 4 minus left knee extension 4, left ankle dorsi flexion plantar flexion 3 minus Musc/Skel:  Positive impingement sign left upper extremity at 90. No evidence of left upper extreme swelling no erythema. Gen. no acute distress, mood and affect are appropriate   Assessment/Plan: 1. Functional deficits secondary to right below-knee amputation secondary to peripheral vascular disease, diabetes mellitus with neuropathy which require 3+ hours per day of interdisciplinary therapy in a comprehensive inpatient rehab setting. Physiatrist is providing close team supervision and 24 hour management of active medical problems listed below. Physiatrist and rehab team continue to assess barriers to discharge/monitor patient  progress toward functional and medical goals.  FIM: Function - Bathing Position: Sitting EOB Body parts bathed by patient: Right arm, Left arm, Chest, Abdomen, Front perineal area, Buttocks, Right upper leg, Left upper leg, Right lower leg Body parts bathed by helper: Back Bathing not applicable: Buttocks, Right lower leg (Rt BKA) Assist Level: Supervision or verbal cues, Set up  Function- Upper Body Dressing/Undressing What is the patient wearing?: Pull over shirt/dress Pull over shirt/dress - Perfomed by patient: Thread/unthread right sleeve, Thread/unthread left sleeve, Put head through opening, Pull shirt over trunk Pull over shirt/dress - Perfomed by helper: Pull shirt over trunk Assist Level: Set up Set up : To obtain clothing/put away Function - Lower Body Dressing/Undressing What is the patient wearing?: Underwear, Pants, Non-skid slipper socks, Shoes Position: Sitting EOB Underwear - Performed by patient: Thread/unthread right underwear leg, Thread/unthread left underwear leg, Pull underwear up/down Pants- Performed by patient: Thread/unthread right pants leg, Thread/unthread left pants leg, Pull pants up/down, Fasten/unfasten pants Pants- Performed by helper: Pull pants up/down, Thread/unthread left pants leg, Thread/unthread right pants leg Non-skid slipper socks- Performed by patient: Don/doff left sock (Rt BKA) Shoes - Performed by patient: Don/doff left shoe, Fasten left Assist for footwear: Setup Assist for lower body dressing: Supervision or verbal cues, Set up Set up : To obtain clothing/put away  Function - Toileting Toileting activity did not occur: N/A Toileting steps completed by patient: Adjust clothing prior to toileting, Performs perineal hygiene, Adjust clothing after toileting Toileting steps completed by helper: Adjust clothing prior to toileting, Performs perineal hygiene, Adjust clothing after toileting Toileting Assistive Devices: Grab bar or rail Assist  level: Supervision or verbal cues, Set up/obtain supplies  Function - Air cabin crew transfer activity did not occur: N/A Toilet transfer assistive device: Drop arm commode Assist level to toilet: Touching or steadying assistance (Pt > 75%) Assist level from toilet: Touching or steadying assistance (Pt > 75%)  Function - Chair/bed transfer Chair/bed transfer activity did not occur: N/A Chair/bed transfer method: Squat pivot Chair/bed transfer assist level: Supervision or verbal cues Chair/bed transfer assistive device: Armrests Chair/bed transfer details: Verbal cues for technique  Function - Locomotion: Wheelchair Will patient use wheelchair at discharge?: Yes Type: Manual Max wheelchair distance: 100 Assist Level: Supervision or verbal cues Assist Level: Supervision or verbal cues Assist Level: Supervision or verbal cues Turns around,maneuvers to table,bed, and toilet,negotiates 3% grade,maneuvers on rugs and over doorsills: No Function - Locomotion: Ambulation Assistive device: Walker-rolling Max distance: 2 Assist level: Moderate assist (Pt 50 - 74%) Assist level: 2 helpers Walk 50 feet with 2 turns activity did not occur: Safety/medical concerns Walk 150 feet activity did not occur: Safety/medical concerns Walk 10 feet on uneven surfaces activity did  not occur: Safety/medical concerns  Function - Comprehension Comprehension: Auditory Comprehension assist level: Follows complex conversation/direction with no assist  Function - Expression Expression: Verbal Expression assist level: Expresses complex ideas: With no assist  Function - Social Interaction Social Interaction assist level: Interacts appropriately with others - No medications needed.  Function - Problem Solving Problem solving assist level: Solves basic problems with no assist  Function - Memory Memory assist level: More than reasonable amount of time Patient normally able to recall (first 3 days  only): Current season, Location of own room, Staff names and faces, That he or she is in a hospital  Medical Problem List and Plan: 1. Functional deficits secondary to right BKA secondary to PVD/ nonhealing ulcer 05/21/2015, DC Coban wrapping, use Kerlix and Ace wrap 2.  DVT Prophylaxis/Anticoagulation: Pradaxa 3. Pain Management: Hydrocodone and Robaxin as needed. Monitor with increased mobility,  4. Acute blood loss anemia. Follow-up CBC 5. Neuropsych: This patient is capable of making decisions on his own behalf. 6. Skin/Wound Care: Routine skin checks, BKA wound is healing well without evidence of drainage 7. Fluids/Electrolytes/Nutrition: Routine I&O, eating 100% meals and has good fluid intake 8. Wound culture Proteus Mirabalis. Continue doxycycline Cipro 2 weeks initiated 05/23/2015, monitor for diarrhea 9. Hypertension/atrial flutter with ablation/nonischemic cardiomyopathy. Continue Pradaxa, clonidine 0.2 mg twice a day, Lanoxin 0.125 mg daily, Lopressor 75 mg twice a day, Minipress 1 mg twice a day. Cardiac rate controlled, BP in good range 10. Chronic systolic congestive heart failure. Demadex 20 mg twice a day. Monitor for any signs of fluid overload, no activity intolerance, only mild peripheral edema. Fluid intake 720 mL, output 1820 mL 11. Diabetes mellitus of peripheral neuropathy. Hemoglobin A1c 9.0. NovoLog 6 units 3 times a day, Levemir 60 units bedtime.CBGs running a bit low in the mornings, reduce Levemir Check blood sugars before meals and at bedtime. Diabetic teaching,  CBGs increased yesterday following steroid injection but have fallen back into the normal range, 12. Chronic renal insufficiency. Baseline creatinine 1.58. Follow-up chemistriesshow improvement of creatinine to 1.1 13. Hyperlipidemia. Lopid/Lipitor 14. History of gout. Zyloprim 300 mg daily. Thus far no evidence for gouty flareup 15.Left shoulder pain , improved after subacromial bursa injection, we will need  to build up shoulder external rotators  LOS (Days) 11 A FACE TO FACE EVALUATION WAS PERFORMED  [name] 06/03/2015, 9:47 AM

## 2015-06-03 NOTE — Progress Notes (Signed)
Recreational Therapy Session Note  Patient Details  Name: [name] MRN: [id number] Date of Birth: Jul 30, 1961 Today's Date: 06/03/2015  Pt participated in animal assisted activity/therapy seated w/c level with supervision.  [place] 06/03/2015, 3:29 PM

## 2015-06-03 NOTE — Progress Notes (Signed)
Physical Therapy Session Note  Patient Details  Name: [name] MRN: [id number] Date of Birth: 1961/05/01  Today's Date: 06/03/2015 PT Individual Time: 1000-1100 PT Individual Time Calculation (min): 60 min   Short Term Goals: Week 2:  PT Short Term Goal 1 (Week 2): =LTG due to estimated LOS  Skilled Therapeutic Interventions/Progress Updates:    Pt received seated in w/c with no c/o pain and agreeable to treatment. W/c propulsion to therapy gym with supervision. Nustep x10 min on level 4 with BUE and LLE; several rest breaks required due to fatigue. Pt notes he feels more fatigued than usual, and therapist noted increased rest breaks taken on Nustep as well as throughout session. Car transfer x2 trials with transfer board and supervision to elevated seat height to simulate getting into cousin's van. Min cues for board placement and removal. Sit <>stand 2 sets 5 reps with RW for stability; performed to improve BLE strength for carryover into gait and transfers. Pt instructed in wrapping L residual limb; required min cues for ensuring coverage of distal end of limb. Pt returned to room and remained seated in w/c at completion of session, all needs within reach.   Therapy Documentation Precautions:  Precautions Precautions: Fall Restrictions Weight Bearing Restrictions: Yes RLE Weight Bearing: Non weight bearing Pain: Pain Assessment Pain Assessment: No/denies pain Pain Score: 0-No pain   See Function Navigator for Current Functional Status.   Therapy/Group: Individual Therapy  Luberta Mutter 06/03/2015, 10:50 AM

## 2015-06-03 NOTE — Progress Notes (Signed)
Occupational Therapy Session Note  Patient Details  Name: [name] MRN: [id number] Date of Birth: 23-May-1961  Today's Date: 06/03/2015 OT Individual Time: 0730-0900 and 1335-1430 OT Individual Time Calculation (min): 90 min and 55 min   Short Term Goals: Week 2:  OT Short Term Goal 1 (Week 2): Pt will complete 1 grooming task in standing OT Short Term Goal 2 (Week 2): Pt will complete perineal hygiene at sit > stand with mod assist OT Short Term Goal 3 (Week 2): Pt will complete toilet transfers with supervision to drop arm BSC OT Short Term Goal 4 (Week 2): Pt will complete simple meal prep at w/c level with supervision  Skilled Therapeutic Interventions/Progress Updates:    1) ADL retraining with focus on bathroom transfers and increased independence and safety with self-care tasks.  Pt performed squat pivot transfers bed <> w/c with setup/supervision.  Bathing completed in room shower from tub bench with pt requiring supervision/cues for lateral scoot onto tub bench with use of grab bars.  Discussed bathroom setup and recommendation for grab bars, addition of nonskid strips/decals, and tub transfer bench; however due to pt's bathroom is not accessible via w/c plan to defer shower equipment to [place].  Pt will require drop arm BSC for toileting.  Mod assist required for transfer out of shower due to decreased traction of floor and weight shift.  Grooming and UB dressing completed at w/c level at sink.  LB dressing completed with lateral leans from EOB.    2) Treatment session with focus on activity tolerance, BUE strengthening, and functional transfers.  Pt reports fatigue but willing to participate in treatment session.  Pt propelled to therapy gym without assist.  Engaged in BUE strengthening and trunk control activities in unsupported sitting on elevated mat to eliminate support from LLE, utilizing ball toss, 2# dowel rod and 2# medicine ball.  Educated pt on use of drop arm commode for  home as bathroom is not accessible via w/c.  Discussed placement and use at home, with pt able to demonstrate transfers bed <> drop arm BSC and w/c <> drop arm BSC.  Verbal cues for setup of w/c with w/c <> drop arm BSC transfers to increase positioning with pt able to verbalize understanding.  Therapy Documentation Precautions:  Precautions Precautions: Fall Restrictions Weight Bearing Restrictions: Yes RLE Weight Bearing: Non weight bearing General:   Vital Signs: Therapy Vitals Pulse Rate: 77 Resp: 18 BP: 128/73 mmHg Patient Position (if appropriate): Sitting Pain:  Pt with no c/o pain this session  See Function Navigator for Current Functional Status.   Therapy/Group: Individual Therapy  Simonne Come 06/03/2015, 10:37 AM

## 2015-06-04 ENCOUNTER — Inpatient Hospital Stay (HOSPITAL_COMMUNITY): Payer: Medicare Other | Admitting: Occupational Therapy

## 2015-06-04 ENCOUNTER — Inpatient Hospital Stay (HOSPITAL_COMMUNITY): Payer: Medicare Other | Admitting: Physical Therapy

## 2015-06-04 LAB — GLUCOSE, CAPILLARY
GLUCOSE-CAPILLARY: 90 mg/dL (ref 65–99)
GLUCOSE-CAPILLARY: 86 mg/dL (ref 65–99)
GLUCOSE-CAPILLARY: 105 mg/dL — AB (ref 65–99)
Glucose-Capillary: 163 mg/dL — ABNORMAL HIGH (ref 65–99)

## 2015-06-04 MED ORDER — INSULIN DETEMIR 100 UNIT/ML ~~LOC~~ SOLN
50.0000 [IU] | Freq: Every day | SUBCUTANEOUS | Status: DC
  Administered 2015-06-04 – 2015-06-06 (×3): 50 [IU] via SUBCUTANEOUS
  Filled 2015-06-04 (×4): qty 0.5

## 2015-06-04 NOTE — Progress Notes (Signed)
Occupational Therapy Session Note  Patient Details  Name: [name] MRN: [id number] Date of Birth: Oct 03, 1960  Today's Date: 06/04/2015 OT Individual Time: 4917-9150 and 1405-1505 OT Individual Time Calculation (min): 45 min and 60 min   Short Term Goals: Week 2:  OT Short Term Goal 1 (Week 2): Pt will complete 1 grooming task in standing OT Short Term Goal 2 (Week 2): Pt will complete perineal hygiene at sit > stand with mod assist OT Short Term Goal 3 (Week 2): Pt will complete toilet transfers with supervision to drop arm BSC OT Short Term Goal 4 (Week 2): Pt will complete simple meal prep at w/c level with supervision  Skilled Therapeutic Interventions/Progress Updates:    1) ADL retraining with focus on residual limb wrapping and increased independence with self-care tasks.  Pt completed lateral scoot transfer bed > w/c with setup.  Bathing and UB dressing completed at sink without assist.  Pt able to retrieve clothing and shoes from w/c level.  Engaged in limb wrapping with additional education and reinforcement of figure 8 wrapping and use of inspection mirror to ensure no "soft" spots.  Provided pt with handout with verbal and pictorial instructions for limb wrapping.  2) Treatment session with focus on functional mobility in home environment with w/c, home making tasks, and BUE strengthening.  Engaged in simple meal prep in ADL kitchen with pt obtaining items from w/c level and problem solving best place to make a sandwich, with pt choosing table top vs counter.  Problem solved positioning of w/c to obtain items from cabinets and oven with pt able to access items with min cues for setup.  Further discussion of bathroom setup and recommend that pt use drop arm BSC exclusively for toileting and complete sink bath in kitchen as bathroom is not accessible via w/c and not ambulating functionally at this time.  Pt reports understanding as he does not want to fall.  Engaged in 10 mins on  [employer] arm bike alternating between resistance 1.5 to 2.0 with 5 mins forward and 5 mins backward, pt reports slight discomfort in Lt shoulder/subacromial location.    Therapy Documentation Precautions:  Precautions Precautions: Fall Restrictions Weight Bearing Restrictions: Yes RLE Weight Bearing: Non weight bearing General:   Vital Signs: Therapy Vitals Temp: 97.9 F (36.6 C) Temp Source: Oral Pulse Rate: 69 Resp: 18 BP: 140/86 mmHg Patient Position (if appropriate): Lying Oxygen Therapy SpO2: 98 % O2 Device: Not Delivered Pain:  Pt with intermittent reports of pain in Lt shoulder, educated on exercises and positioning to improve strengthening and decrease pain  See Function Navigator for Current Functional Status.   Therapy/Group: Individual Therapy  Simonne Come 06/04/2015, 9:23 AM

## 2015-06-04 NOTE — Progress Notes (Signed)
Subjective/Complaints: Discussed patient with OT, completing ADL program. Supervision level overall. No safety issues   ROS: Negative neck pain, denies any bowel bladder dysfunction or breathing issues  Objective: Vital Signs: Blood pressure 140/86, pulse 69, temperature 97.9 F (36.6 C), temperature source Oral, resp. rate 18, height 5\' 11"  (1.803 m), weight 116.801 kg (257 lb 8 oz), SpO2 98 %. No results found. Results for orders placed or performed during the hospital encounter of 05/23/15 (from the past 72 hour(s))  Glucose, capillary     Status: Abnormal   Collection Time: 06/01/15  4:42 PM  Result Value Ref Range   Glucose-Capillary 126 (H) 65 - 99 mg/dL  Glucose, capillary     Status: Abnormal   Collection Time: 06/01/15  8:36 PM  Result Value Ref Range   Glucose-Capillary 127 (H) 65 - 99 mg/dL   Comment 1 Notify RN   Glucose, capillary     Status: None   Collection Time: 06/02/15  6:18 AM  Result Value Ref Range   Glucose-Capillary 83 65 - 99 mg/dL  Glucose, capillary     Status: None   Collection Time: 06/02/15  7:17 AM  Result Value Ref Range   Glucose-Capillary 85 65 - 99 mg/dL  Glucose, capillary     Status: Abnormal   Collection Time: 06/02/15 11:36 AM  Result Value Ref Range   Glucose-Capillary 100 (H) 65 - 99 mg/dL   Comment 1 Notify RN   Glucose, capillary     Status: Abnormal   Collection Time: 06/02/15  4:53 PM  Result Value Ref Range   Glucose-Capillary 166 (H) 65 - 99 mg/dL   Comment 1 Notify RN   Glucose, capillary     Status: Abnormal   Collection Time: 06/02/15  9:12 PM  Result Value Ref Range   Glucose-Capillary 181 (H) 65 - 99 mg/dL  Glucose, capillary     Status: None   Collection Time: 06/03/15  6:43 AM  Result Value Ref Range   Glucose-Capillary 90 65 - 99 mg/dL  Glucose, capillary     Status: Abnormal   Collection Time: 06/03/15 11:34 AM  Result Value Ref Range   Glucose-Capillary 101 (H) 65 - 99 mg/dL  Glucose, capillary      Status: Abnormal   Collection Time: 06/03/15  4:58 PM  Result Value Ref Range   Glucose-Capillary 105 (H) 65 - 99 mg/dL  Glucose, capillary     Status: Abnormal   Collection Time: 06/03/15  8:54 PM  Result Value Ref Range   Glucose-Capillary 158 (H) 65 - 99 mg/dL  Glucose, capillary     Status: None   Collection Time: 06/04/15  6:53 AM  Result Value Ref Range   Glucose-Capillary 90 65 - 99 mg/dL     HEENT: normal and poor dentition Cardio: Irreg Irreg, no murmur Resp: CTA B/L and unlabored GI: BS positive and NT, ND Extremity:  Pulses positive and , Right BKA site unwrapped, incision line is clean, small open pink granulating areas on the medial and lateral aspects, No drainage, mild stump edema Skin:   Intact Except as above Neuro: Alert/Oriented, Abnormal Sensory Reduced sensation Left foot and ankle to LT and Abnormal Motor 5/5 Bilateral deltoid, biceps, triceps, grip, left hip flexion 4 minus left knee extension 4, left ankle dorsi flexion plantar flexion 3 minus Musc/Skel:  negative impingement sign left upper extremity . No evidence of left upper extreme swelling no erythema. Gen. no acute distress, mood and affect are appropriate  Assessment/Plan: 1. Functional deficits secondary to right below-knee amputation secondary to peripheral vascular disease, diabetes mellitus with neuropathy which require 3+ hours per day of interdisciplinary therapy in a comprehensive inpatient rehab setting. Physiatrist is providing close team supervision and 24 hour management of active medical problems listed below. Physiatrist and rehab team continue to assess barriers to discharge/monitor patient progress toward functional and medical goals.  FIM: Function - Bathing Position: Wheelchair/chair at sink Body parts bathed by patient: Right arm, Left arm, Chest, Abdomen, Front perineal area, Right upper leg, Left upper leg, Left lower leg, Back Body parts bathed by helper: Back Bathing not  applicable: Buttocks, Right lower leg (Rt BKA) Assist Level: Supervision or verbal cues  Function- Upper Body Dressing/Undressing What is the patient wearing?: Pull over shirt/dress Pull over shirt/dress - Perfomed by patient: Thread/unthread right sleeve, Thread/unthread left sleeve, Put head through opening, Pull shirt over trunk Pull over shirt/dress - Perfomed by helper: Pull shirt over trunk Assist Level: More than reasonable time Set up : To obtain clothing/put away Function - Lower Body Dressing/Undressing What is the patient wearing?: Underwear, Pants, Non-skid slipper socks, Shoes, Ted Hose Position: Sitting EOB Underwear - Performed by patient: Thread/unthread right underwear leg, Thread/unthread left underwear leg, Pull underwear up/down Pants- Performed by patient: Thread/unthread right pants leg, Thread/unthread left pants leg, Pull pants up/down, Fasten/unfasten pants Pants- Performed by helper: Pull pants up/down, Thread/unthread left pants leg, Thread/unthread right pants leg Non-skid slipper socks- Performed by patient: Don/doff left sock (Rt BKA) Shoes - Performed by patient: Don/doff left shoe, Fasten left TED Hose - Performed by helper: Don/doff left TED hose Assist for footwear: Independent Assist for lower body dressing: Supervision or verbal cues, Set up Set up : Don/doff TED stockings  Function - Toileting Toileting activity did not occur: N/A Toileting steps completed by patient: Adjust clothing prior to toileting, Performs perineal hygiene, Adjust clothing after toileting Toileting steps completed by helper: Adjust clothing prior to toileting, Performs perineal hygiene, Adjust clothing after toileting Toileting Assistive Devices: Grab bar or rail Assist level: Supervision or verbal cues, Set up/obtain supplies  Function - Air cabin crew transfer activity did not occur: N/A Toilet transfer assistive device: Drop arm commode Assist level to toilet:  Touching or steadying assistance (Pt > 75%) Assist level from toilet: Touching or steadying assistance (Pt > 75%) Assist level to bedside commode (at bedside): Supervision or verbal cues Assist level from bedside commode (at bedside): Touching or steadying assistance (Pt > 75%)  Function - Chair/bed transfer Chair/bed transfer activity did not occur: N/A Chair/bed transfer method: Squat pivot Chair/bed transfer assist level: Supervision or verbal cues Chair/bed transfer assistive device: Armrests Chair/bed transfer details: Verbal cues for technique, Verbal cues for sequencing  Function - Locomotion: Wheelchair Will patient use wheelchair at discharge?: Yes Type: Manual Max wheelchair distance: 150 Assist Level: Supervision or verbal cues Assist Level: Supervision or verbal cues Assist Level: Supervision or verbal cues Turns around,maneuvers to table,bed, and toilet,negotiates 3% grade,maneuvers on rugs and over doorsills: No Function - Locomotion: Ambulation Assistive device: Walker-rolling Max distance: 2 Assist level: Moderate assist (Pt 50 - 74%) Assist level: 2 helpers Walk 50 feet with 2 turns activity did not occur: Safety/medical concerns Walk 150 feet activity did not occur: Safety/medical concerns Walk 10 feet on uneven surfaces activity did not occur: Safety/medical concerns  Function - Comprehension Comprehension: Auditory Comprehension assist level: Follows basic conversation/direction with extra time/assistive device  Function - Expression Expression: Verbal Expression assist level: Expresses basic  needs/ideas: With no assist  Function - Social Interaction Social Interaction assist level: Interacts appropriately with others with medication or extra time (anti-anxiety, antidepressant).  Function - Problem Solving Problem solving assist level: Solves basic problems with no assist  Function - Memory Memory assist level: More than reasonable amount of  time Patient normally able to recall (first 3 days only): Current season, Location of own room, Staff names and faces, That he or she is in a hospital  Medical Problem List and Plan: 1. Functional deficits secondary to right BKA secondary to PVD/ nonhealing ulcer 05/21/2015, DC Coban wrapping, use Kerlix and Ace wrap 2.  DVT Prophylaxis/Anticoagulation: Pradaxa 3. Pain Management: Hydrocodone and Robaxin as needed. Monitor with increased mobility,  4. Acute blood loss anemia. Follow-up CBC 5. Neuropsych: This patient is capable of making decisions on his own behalf. 6. Skin/Wound Care: Routine skin checks, BKA wound is healing well without evidence of drainage 7. Fluids/Electrolytes/Nutrition: Routine I&O, eating 100% meals and has good fluid intake 8. Wound culture Proteus Mirabalis. Continue doxycycline Cipro 2 weeks initiated 05/23/2015, monitor for diarrhea 9. Hypertension/atrial flutter with ablation/nonischemic cardiomyopathy. Continue Pradaxa, clonidine 0.2 mg twice a day, Lanoxin 0.125 mg daily, Lopressor 75 mg twice a day, Minipress 1 mg twice a day. Cardiac rate controlled, BP in good range 140/86 today                10. Chronic systolic congestive heart failure. Demadex 20 mg twice a day. Monitor for any signs of fluid overload, no activity intolerance, only mild peripheral edema. Fluid intake 720 mL, output 1820 mL 11. Diabetes mellitus of peripheral neuropathy. Hemoglobin A1c 9.0. NovoLog 6 units 3 times a day, Levemir 60 units bedtime.CBGs running a bit low in the mornings, reduce Levemir 250 units at bedtime Check blood sugars before meals and at bedtime. Diabetic teaching,                                                                12. Chronic renal insufficiency. Baseline creatinine 1.58. Follow-up chemistriesshow improvement of creatinine to 1.1 13. Hyperlipidemia. Lopid/Lipitor 14. History of gout. Zyloprim 300 mg daily. Thus far no evidence for gouty flareup 15.Left shoulder  pain , improved after subacromial bursa injection, we will need to build up shoulder external rotators  LOS (Days) 12 A FACE TO FACE EVALUATION WAS PERFORMED  Anapaula Severt E 06/04/2015, 11:49 AM

## 2015-06-04 NOTE — Progress Notes (Signed)
Occupational Therapy Session Note  Patient Details  Name: [name] MRN: [id number] Date of Birth: 15-Nov-1960  Today's Date: 06/04/2015 OT Individual Time: 1300-1330 OT Individual Time Calculation (min): 30 min    Skilled Therapeutic Interventions/Progress Updates:    Pt rolled his wheelchair to the therapy gym with modified independence and then transferred to the mat with min assist squat pivot.  He was able to work on sit to stand and standing during session from various heights.  Initially able to stand with min assist from mat 25 inches.  Mod instructional cueing for hand placement as pt tends to pull up on the walker secondary to left shoulder pain when pushing.  Once mat was lowered to 21 inches he needed light mod assist to stand.  In standing, pt was able to remove his hands from the walker, one at a time to reach therapist's outstretched hand.  Much harder to release using the RUE compared to the left.  Educated pt on scapular retraction exercises to assist with shoulder alignment and strengthening.  Pt also completed 2 sets of 10 repetitions for shoulder row using light resistance level 1 theraband.  Pt transferred back to the wheelchair and returned to room.  Min assist for transfer back to the wheelchair without use of a sliding board.    Therapy Documentation Precautions:  Precautions Precautions: Fall Restrictions Weight Bearing Restrictions: Yes RLE Weight Bearing: Non weight bearing  Pain: Pain Assessment Pain Assessment: Faces Faces Pain Scale: Hurts little more Pain Type: Acute pain Pain Location: Shoulder Pain Orientation: Left Pain Onset: With Activity Pain Intervention(s): Repositioned   See Function Navigator for Current Functional Status.   Therapy/Group: Individual Therapy  Marco Adelson OTR/L 06/04/2015, 4:25 PM

## 2015-06-04 NOTE — Progress Notes (Signed)
Physical Therapy Session Note  Patient Details  Name: [name] MRN: [id number] Date of Birth: Jan 12, 1961  Today's Date: 06/04/2015 PT Individual Time: 0930-1030 PT Individual Time Calculation (min): 60 min   Short Term Goals: Week 2:  PT Short Term Goal 1 (Week 2): =LTG due to estimated LOS  Skilled Therapeutic Interventions/Progress Updates:    Pt received seated in w/c with no c/o pain and agreeable to treatment. W/c propulsion to/from gym x150' with BUEs and supervision. Squat pivot w/c <>mat table with supervision. Seated/supine/sidelying LE strengthening exercises with 3# ankle cuff weights, including hip flexion, long arc quad, hip abduction, hip adduction, bridging, straight leg raise. Performed quadruped weight shifts x2 trials x approximately 45 sec each with extended rest break between. Grade I/II inferior and posterior joint mobilizations to L shoulder for pain modulation with pt reporting mild improvement in pain symptoms. Pt returned to room with w/c propulsion as above. Removed ace wrap from RLE residual to assess skin integrity; noted min dry drainage on gauze, otherwise WNL. Remained seated in w/c with all needs in reach at completion of session.   Therapy Documentation Precautions:  Precautions Precautions: Fall Restrictions Weight Bearing Restrictions: Yes RLE Weight Bearing: Non weight bearing Pain: Pain Assessment Pain Assessment: No/denies pain Pain Score: 0-No pain   See Function Navigator for Current Functional Status.   Therapy/Group: Individual Therapy  Luberta Mutter 06/04/2015, 12:24 PM

## 2015-06-05 ENCOUNTER — Inpatient Hospital Stay (HOSPITAL_COMMUNITY): Payer: Medicare Other | Admitting: Physical Therapy

## 2015-06-05 ENCOUNTER — Inpatient Hospital Stay (HOSPITAL_COMMUNITY): Payer: Medicare Other | Admitting: Occupational Therapy

## 2015-06-05 DIAGNOSIS — I482 Chronic atrial fibrillation: Secondary | ICD-10-CM

## 2015-06-05 LAB — CBC
HEMATOCRIT: 29.8 % — AB (ref 39.0–52.0)
Hemoglobin: 9.4 g/dL — ABNORMAL LOW (ref 13.0–17.0)
MCH: 27.2 pg (ref 26.0–34.0)
MCHC: 31.5 g/dL (ref 30.0–36.0)
MCV: 86.4 fL (ref 78.0–100.0)
Platelets: 265 10*3/uL (ref 150–400)
RBC: 3.45 MIL/uL — AB (ref 4.22–5.81)
RDW: 19.1 % — ABNORMAL HIGH (ref 11.5–15.5)
WBC: 10 10*3/uL (ref 4.0–10.5)

## 2015-06-05 LAB — GLUCOSE, CAPILLARY
GLUCOSE-CAPILLARY: 131 mg/dL — AB (ref 65–99)
Glucose-Capillary: 83 mg/dL (ref 65–99)
Glucose-Capillary: 82 mg/dL (ref 65–99)
Glucose-Capillary: 91 mg/dL (ref 65–99)

## 2015-06-05 NOTE — Progress Notes (Signed)
Social Work Elease Hashimoto, LCSW Social Worker Signed  Patient Care Conference 06/05/2015 12:55 PM    Expand All Collapse All   Inpatient RehabilitationTeam Conference and Plan of Care Update Date: 06/05/2015   Time: 11:10 AM     Patient Name: DEVONTA BLANFORD       Medical Record Number: [id number]  Date of Birth: Sep 20, 1960 Sex: Male         Room/Bed: 4W13C/4W13C-01 Payor Info: Payor: MEDICARE / Plan: MEDICARE PART A AND B / Product Type: *No Product type* /    Admitting Diagnosis: R BKA   Admit Date/Time:  05/23/2015  5:01 PM Admission Comments: No comment available   Primary Diagnosis:  Amputation of right lower extremity below knee ([place]) Principal Problem: Amputation of right lower extremity below knee [facility])    Patient Active Problem List     Diagnosis  Date Noted   .  Status post below knee amputation of right lower extremity ([place])  05/23/2015   .  Amputation of right lower extremity below knee ([place])  05/23/2015   .  Chronic osteomyelitis ([place])  05/20/2015   .  Right foot ulcer ([place])  05/20/2015   .  DM (diabetes mellitus) type 2, uncontrolled  05/20/2015   .  Systolic dysfunction with acute on chronic heart failure ([place])  01/25/2015   .  Bladder cancer ([place])     .  Non-ischemic cardiomyopathy ([place])     .  Hematuria  10/14/2014   .  Bladder tumor  10/14/2014   .  Cellulitis     .  Atrial fibrillation with RVR ([place])     .  LBBB (left bundle branch block)  10/02/2013   .  Anasarca  03/25/2012   .  HLD (hyperlipidemia)  03/09/2012   .  Lower extremity edema  12/30/2011   .  Chronic systolic dysfunction of left ventricle  07/29/2011   .  Chronic anticoagulation  12/10/2010   .  VENOUS INSUFFICIENCY, LEGS  05/17/2009   .  OBESITY-MORBID (>100')  05/06/2009   .  Essential hypertension  05/06/2009   .  CARDIOMYOPATHY, SECONDARY  05/06/2009   .  Atrial flutter ([place])  05/06/2009     Expected Discharge Date: Expected Discharge Date: 06/07/15  Team Members  Present: Physician leading conference: Dr. Alysia Penna Social Worker Present: Ovidio Kin, LCSW Nurse Present: Dorien Chihuahua, RN PT Present: Kem Parkinson, PT OT Present: Simonne Come, OT SLP Present: Windell Moulding, SLP PPS Coordinator present : Daiva Nakayama, RN, CRRN        Current Status/Progress  Goal  Weekly Team Focus   Medical     shoulder pain improved, fall in PT, minor bleeding from stump   Home Mod I  d/c planning   Bowel/Bladder     continent of bowel and bladder, uses the urinal independently. LBM 10/18  Continent of bladder and bowel, continue Mod I  Mod I   Swallow/Nutrition/ Hydration               ADL's     supervision bathing and dressing with lateral leans, supervision basic transfers, mod assist shower transfer  Mod I w/c level, supervision shower transfers   activity tolerance, sit <> stand, transfers, home making tasks from w/c level   Mobility     mod I bed mobility, supervision squat pivot transfer and car transfer with transfer board,   modI bed mobility, transfers, w/c propulsion; ambulation goals d/c  LE/core strengthening, car transfers, sit <>  stand for functional ADLs   Communication       na         Safety/Cognition/ Behavioral Observations      no unsafe behaviors         Pain      (L) shoulder pain, and (R) BKA. Norco q 4 hrs PRN  <4  Assess pain q 4 hrs and PRN   Skin     (R) BKA petroleum gauze, 4x4, kerlix, and ace wrap. Change dressing daily  Continue with daily (R) stump dressing change. Educate patient on stump wrapping  Discuss with patient s/s of infection. Continue to allow patient to demonstrate understanding of stump wrapping.        *See Care Plan and progress notes for long and short-term goals.    Barriers to Discharge:  fall risk     Possible Resolutions to Barriers:   home at [facility] level Mod I     Discharge Planning/Teaching Needs:   HOme alone with Mom checking daily on him. Preparing for discharge this Friday        Team Discussion:    Reaching goals of mod/i wheelchair level and ready for discharge Friday. L-shoulder pain is better from the injection last week. Continues to be weak, but activity tolerance is better. Plans to sponge bathe until he is strong enough to hop into his bathroom.   Revisions to Treatment Plan:    None    Continued Need for Acute Rehabilitation Level of Care: The patient requires daily medical management by a physician with specialized training in physical medicine and rehabilitation for the following conditions: Daily direction of a multidisciplinary physical rehabilitation program to ensure safe treatment while eliciting the highest outcome that is of practical value to the patient.: Yes Daily medical management of patient stability for increased activity during participation in an intensive rehabilitation regime.: Yes Daily analysis of laboratory values and/or radiology reports with any subsequent need for medication adjustment of medical intervention for : Neurological problems;Post surgical problems  Lilliauna Van, Gardiner Rhyme 06/05/2015, 12:55 PM                 Elease Hashimoto, [place] Worker Signed  Patient Care Conference 05/29/2015  1:04 PM    Expand All Collapse All   Inpatient RehabilitationTeam Conference and Plan of Care Update Date: 05/29/2015   Time: 11:00 AM     Patient Name: VELMER WOELFEL       Medical Record Number: [id number]  Date of Birth: Apr 05, 1961 Sex: Male         Room/Bed: 4W13C/4W13C-01 Payor Info: Payor: MEDICARE / Plan: MEDICARE PART A AND B / Product Type: *No Product type* /    Admitting Diagnosis: R BKA   Admit Date/Time:  05/23/2015  5:01 PM Admission Comments: No comment available   Primary Diagnosis:  <principal problem not specified> Principal Problem: <principal problem not specified>    Patient Active Problem List     Diagnosis  Date Noted   .  Status post below knee amputation of right lower extremity ([place])  05/23/2015   .   Amputation of right lower extremity below knee ([place])  05/23/2015   .  Chronic osteomyelitis ([place])  05/20/2015   .  Right foot ulcer ([place])  05/20/2015   .  DM (diabetes mellitus) type 2, uncontrolled  05/20/2015   .  Systolic dysfunction with acute on chronic heart failure ([place])  01/25/2015   .  Bladder cancer ([place])     .  Non-ischemic cardiomyopathy ([place])     .  Hematuria  10/14/2014   .  Bladder tumor  10/14/2014   .  Cellulitis     .  Atrial fibrillation with RVR ([place])     .  LBBB (left bundle branch block)  10/02/2013   .  Anasarca  03/25/2012   .  HLD (hyperlipidemia)  03/09/2012   .  Lower extremity edema  12/30/2011   .  Chronic systolic dysfunction of left ventricle  07/29/2011   .  Chronic anticoagulation  12/10/2010   .  VENOUS INSUFFICIENCY, LEGS  05/17/2009   .  OBESITY-MORBID (>100')  05/06/2009   .  Essential hypertension  05/06/2009   .  CARDIOMYOPATHY, SECONDARY  05/06/2009   .  Atrial flutter ([place])  05/06/2009     Expected Discharge Date: Expected Discharge Date: 06/07/15  Team Members Present: Physician leading conference: Dr. Alysia Penna Social Worker Present: Ovidio Kin, LCSW Nurse Present: Heather Roberts, RN PT Present: Kem Parkinson, PT OT Present: Simonne Come, OT PPS Coordinator present : Daiva Nakayama, RN, CRRN        Current Status/Progress  Goal  Weekly Team Focus   Medical     left shoulder pain, R BKA is healing   Home with Mod I  Left shoulder injection   Bowel/Bladder     continent of bowel and bladder; 05/27/15   mod I  educate and monitor s/s of constipation    Swallow/Nutrition/ Hydration       na         ADL's     min assist bathing and UB dressing, max assist LB dressing, supervision transfers with slide board - mod assist squat pivot, max assist sit > stand  Mod I w/c level, supervision shower transfers   activity tolerance, sit <> stand, transfers, standing balance with LB self-care tasks   Mobility     supervision bed  mobility, transfers with transfer board, mod/maxA sit <>stand and stand pivot with RW, gait mod/maxA x10'  modI bed mobility, transfers, w/c propulsion, modA ambulation with therapy only  activity tolerance, sit <>stand, transfers, standing balance,    Communication       na         Safety/Cognition/ Behavioral Observations      no unsafe behaviors         Pain     c/o L shoulder pain, R BKA phantom pain and surgical pain; 5-10mg  Oxy IR q 3hr prn, 1-2 Norco q 4hr prn  <4 on a 0-10 scale  assess pain q 4hr and prn   Skin     R BKA cobain dressing in place; CDI   remain free from breakdown while on rehab   assess skin q shift and prn      *See Care Plan and progress notes for long and short-term goals.    Barriers to Discharge:  Left shoulder pain interfering with therapy      Possible Resolutions to Barriers:   see above     Discharge Planning/Teaching Needs:   HOme alone with Mom and friedns coming to check on daily. Needs to be mod/i level either wheelchair level or ambulation        Team Discussion:    Goals-mod/i wheelchair level-working on activity tolerance and pain issues. L-shoulder pain-injected today. Building ramp. Dressing taken off today-looks good-begin ace wrapping.    Revisions to Treatment Plan:    none    Continued Need for Acute Rehabilitation Level  of Care: The patient requires daily medical management by a physician with specialized training in physical medicine and rehabilitation for the following conditions: Daily direction of a multidisciplinary physical rehabilitation program to ensure safe treatment while eliciting the highest outcome that is of practical value to the patient.: Yes Daily medical management of patient stability for increased activity during participation in an intensive rehabilitation regime.: Yes Daily analysis of laboratory values and/or radiology reports with any subsequent need for medication adjustment of medical intervention for :  Other;Post surgical problems  Elease Hashimoto 05/29/2015, 1:04 PM                  Patient ID: [name], male   DOB: 1961-01-31, 54 y.o.   MRN: [id number]

## 2015-06-05 NOTE — Progress Notes (Signed)
Social Work Patient ID: [name], male   DOB: 1961-06-04, 54 y.o.   MRN: [id number] Met with pt to discuss team conference reaching goals of mod/i wheelchair level and being ready by Friday, set discharge date. He reported the tumble he took today with his PT but states: " It wasn't her fault and I am fine."  He feels ready to go home and has his equipment delivered in his room. No preference for home health follow up, will make referral for this. Pt expressed no concerns regarding going home.

## 2015-06-05 NOTE — Progress Notes (Signed)
Occupational Therapy Session Note  Patient Details  Name: [name] MRN: [id number] Date of Birth: 08/28/1960  Today's Date: 06/05/2015 OT Individual Time: 0930-1000 and 1130-1200 OT Individual Time Calculation (min): 30 min and 30 min   Short Term Goals: Week 2:  OT Short Term Goal 1 (Week 2): Pt will complete 1 grooming task in standing OT Short Term Goal 2 (Week 2): Pt will complete perineal hygiene at sit > stand with mod assist OT Short Term Goal 3 (Week 2): Pt will complete toilet transfers with supervision to drop arm BSC OT Short Term Goal 4 (Week 2): Pt will complete simple meal prep at w/c level with supervision  Skilled Therapeutic Interventions/Progress Updates:    1) Treatment session with focus on self-care retraining and discussion of safe strategies to decrease fall risk with ADLs and IADLs.  Pt completed grooming tasks and UB dressing at sink from w/c level without assist.  Discussed fall risk and eliminating additional fall risk, with discussion of kitchen setup and use of DME and AE to increase independence.  2) Treatment session with focus on home making tasks with simulated laundry task and opening/closing doors to access home environment.  Educated on proper w/c positioning to increase safety with reaching into drawers and washer/dryer as well as use of reacher to reach hard to reach items while focusing on safety.  Pt able to return demonstrate strategies.  Pt required increased time with opening and closing doors, reports living alone and won't close doors in the house and would typically have someone with him when he leaves the house.  Therapy Documentation Precautions:  Precautions Precautions: Fall Restrictions Weight Bearing Restrictions: Yes RLE Weight Bearing: Non weight bearing General:   Vital Signs: Therapy Vitals Pulse Rate: 63 Resp: 18 BP: 135/82 mmHg Patient Position (if appropriate): Sitting Pain: Pain Assessment Pain Assessment:  0-10 Pain Score: 2  Pain Type: Acute pain Pain Location: Leg Pain Orientation: Right Pain Onset: With Activity Pain Intervention(s): Repositioned  See Function Navigator for Current Functional Status.   Therapy/Group: Individual Therapy  Simonne Come 06/05/2015, 11:25 AM

## 2015-06-05 NOTE — Progress Notes (Signed)
Physical Therapy Note  Patient Details  Name: GWEN EDLER MRN: [id number] Date of Birth: 09-15-1960 Today's Date: 06/05/2015    Time: 1000-1030 30 minutes  1:1 No c/o pain.  W/c mobility in controlled and home environment with supervision.  Ramp with w/c initially min A, progressed to supervision with multiple attempts.  UE therex with 5# bar. Pt tolerated session well.   DONAWERTH,KAREN 06/05/2015, 10:34 AM

## 2015-06-05 NOTE — Progress Notes (Signed)
   06/05/15 0830  What Happened  Was fall witnessed? Yes  Who witnessed fall? Richardson Dopp, PT  Patients activity before fall during therapy  Point of contact other (comment) ((R) BKA)  Was patient injured? No (incision line assessed by Kirsteins,MD)  Follow Up  MD notified Kirsteins, MD  Time MD notified 68  Family notified Yes-comment  Time family notified [phone] (patient's mother- Bryson Dames)  Additional tests No  Simple treatment Dressing (redressed (R) BKA)  Adult Fall Risk Assessment  Risk Factor Category (scoring not indicated) Fall has occurred during this admission (document High fall risk)  Age 54  Fall History: Fall within 6 months prior to admission 5  Elimination; Bowel and/or Urine Incontinence 0  Elimination; Bowel and/or Urine Urgency/Frequency 0  Medications: includes PCA/Opiates, Anti-convulsants, Anti-hypertensives, Diuretics, Hypnotics, Laxatives, Sedatives, and Psychotropics 5  Patient Care Equipment 0  Mobility-Assistance 2  Mobility-Gait 2  Mobility-Sensory Deficit 0  Cognition-Awareness 0  Cognition-Impulsiveness 0  Cognition-Limitations 0  Total Score 14  Patient's Fall Risk High Fall Risk (>13 points)  Adult Fall Risk Interventions  Required Bundle Interventions *See Row Information* High fall risk - low, moderate, and high requirements implemented  Additional Interventions Fall risk signage;PT/OT need assessed if change in mobility from baseline  Fall with Injury Screening  Risk For Fall Injury- See Row Information  Nurse judgement  Pain Assessment  Pain Assessment 0-10  Pain Score 2  Pain Type Acute pain  Pain Location Leg  Pain Orientation Right  Pain Onset With Activity  Pain Intervention(s) Repositioned

## 2015-06-05 NOTE — Progress Notes (Signed)
Physical Therapy Session Note  Patient Details  Name: [name] MRN: [id number] Date of Birth: 06/04/1961  Today's Date: 06/05/2015 PT Individual Time: 0800-0900 PT Individual Time Calculation (min): 60 min   Short Term Goals: Week 2:  PT Short Term Goal 1 (Week 2): =LTG due to estimated LOS  Skilled Therapeutic Interventions/Progress Updates:    Pt received seated on EOB; c/o mild L shoulder pain however does not rate. Squat pivot transfer bed >w/c with supervision. W/c propulsion to therapy gym with mod I. In parallel bars, performed 1 1-inch stair with BUEs on bars and minA with verbal cues for foot positioning adjustments on step. Returned to w/c for seated rest break. On second attempt, pt's arms gave out and hit RLE residual on the ground. Residual limb inspected and found to be bleeding; RN alerted and pt returned to room to have limb inspected. MD and PA inspected limb and noted minor bleeding; RN changed dressing and wrapped limb. Vitals assessed: RR 18 breaths/min, BP 135/82, HR 63 bpm, O2 98% on room air. Pt requests to return to therapy gym to continue session and reports improvement in RLE pain to 2/10 "barely noticeable". Performed 3 sets 5 reps sit ups from semi-reclined wedge for core strengthening. Supine LE strengthening including straight leg raise, bridging on bolster. Squat pivot to return to w/c with supervision. Remained seated in w/c at completion of session with all needs within reach.   Therapy Documentation Precautions:  Precautions Precautions: Fall Restrictions Weight Bearing Restrictions: Yes RLE Weight Bearing: Non weight bearing Pain: Pain Assessment Pain Assessment: 0-10 Pain Score: 2  Pain Type: Acute pain Pain Location: Leg Pain Orientation: Right Pain Intervention(s): RN made aware   See Function Navigator for Current Functional Status.   Therapy/Group: Individual Therapy  Luberta Mutter 06/05/2015, 8:49 AM

## 2015-06-05 NOTE — Progress Notes (Signed)
Subjective/Complaints: Patient fell in physical therapy well going up 1 inch step. His arms gave out. He noted bleeding along the stump bandage afterwards. Nurse and therapist took the patient back to the room. Initially he had pain but no longer has pain along the stump.   ROS: Negative neck pain, denies any bowel bladder dysfunction or breathing issues  Objective: Vital Signs: Blood pressure 135/82, pulse 63, temperature 97.5 F (36.4 C), temperature source Oral, resp. rate 18, height 5' 11"  (1.803 m), weight 116.801 kg (257 lb 8 oz), SpO2 99 %. No results found. Results for orders placed or performed during the hospital encounter of 05/23/15 (from the past 72 hour(s))  Glucose, capillary     Status: Abnormal   Collection Time: 06/02/15 11:36 AM  Result Value Ref Range   Glucose-Capillary 100 (H) 65 - 99 mg/dL   Comment 1 Notify RN   Glucose, capillary     Status: Abnormal   Collection Time: 06/02/15  4:53 PM  Result Value Ref Range   Glucose-Capillary 166 (H) 65 - 99 mg/dL   Comment 1 Notify RN   Glucose, capillary     Status: Abnormal   Collection Time: 06/02/15  9:12 PM  Result Value Ref Range   Glucose-Capillary 181 (H) 65 - 99 mg/dL  Glucose, capillary     Status: None   Collection Time: 06/03/15  6:43 AM  Result Value Ref Range   Glucose-Capillary 90 65 - 99 mg/dL  Glucose, capillary     Status: Abnormal   Collection Time: 06/03/15 11:34 AM  Result Value Ref Range   Glucose-Capillary 101 (H) 65 - 99 mg/dL  Glucose, capillary     Status: Abnormal   Collection Time: 06/03/15  4:58 PM  Result Value Ref Range   Glucose-Capillary 105 (H) 65 - 99 mg/dL  Glucose, capillary     Status: Abnormal   Collection Time: 06/03/15  8:54 PM  Result Value Ref Range   Glucose-Capillary 158 (H) 65 - 99 mg/dL  Glucose, capillary     Status: None   Collection Time: 06/04/15  6:53 AM  Result Value Ref Range   Glucose-Capillary 90 65 - 99 mg/dL  Glucose, capillary     Status: None    Collection Time: 06/04/15 11:53 AM  Result Value Ref Range   Glucose-Capillary 86 65 - 99 mg/dL  Glucose, capillary     Status: Abnormal   Collection Time: 06/04/15  4:46 PM  Result Value Ref Range   Glucose-Capillary 105 (H) 65 - 99 mg/dL  Glucose, capillary     Status: Abnormal   Collection Time: 06/04/15  8:54 PM  Result Value Ref Range   Glucose-Capillary 163 (H) 65 - 99 mg/dL  Glucose, capillary     Status: None   Collection Time: 06/05/15  7:04 AM  Result Value Ref Range   Glucose-Capillary 83 65 - 99 mg/dL     HEENT: normal and poor dentition Cardio: Irreg Irreg, no murmur Resp: CTA B/L and unlabored GI: BS positive and NT, ND Extremity:  Pulses positive and , Right BKA site unwrapped, incision line is clean, small open pink granulating areas on the medial and lateral aspects, No drainage, mild stump edema Skin:   Intact Lateral aspect of right BKA stump small amount of blood using, no disruption of staples. Neuro: Alert/Oriented, Abnormal Sensory Reduced sensation Left foot and ankle to LT and Abnormal Motor 5/5 Bilateral deltoid, biceps, triceps, grip, left hip flexion 4 minus left knee extension  4, left ankle dorsi flexion plantar flexion 3 minus Musc/Skel:  negative impingement sign left upper extremity . No evidence of left upper extreme swelling no erythema. Gen. no acute distress, mood and affect are appropriate   Assessment/Plan: 1. Functional deficits secondary to right below-knee amputation secondary to peripheral vascular disease, diabetes mellitus with neuropathy which require 3+ hours per day of interdisciplinary therapy in a comprehensive inpatient rehab setting. Physiatrist is providing close team supervision and 24 hour management of active medical problems listed below. Physiatrist and rehab team continue to assess barriers to discharge/monitor patient progress toward functional and medical goals. Team conference today please see physician documentation  under team conference tab, met with team face-to-face to discuss problems,progress, and goals. Formulized individual treatment plan based on medical history, underlying problem and comorbidities. Plan on discharge 06/07/2015 FIM: Function - Bathing Position: Wheelchair/chair at sink Body parts bathed by patient: Right arm, Left arm, Chest, Abdomen, Front perineal area, Right upper leg, Left upper leg, Left lower leg, Back Body parts bathed by helper: Back Bathing not applicable: Buttocks, Right lower leg (Rt BKA) Assist Level: Supervision or verbal cues  Function- Upper Body Dressing/Undressing What is the patient wearing?: Pull over shirt/dress Pull over shirt/dress - Perfomed by patient: Thread/unthread right sleeve, Thread/unthread left sleeve, Put head through opening, Pull shirt over trunk Pull over shirt/dress - Perfomed by helper: Pull shirt over trunk Assist Level: More than reasonable time Set up : To obtain clothing/put away Function - Lower Body Dressing/Undressing What is the patient wearing?: Underwear, Pants, Non-skid slipper socks, Shoes, Ted Hose Position: Sitting EOB Underwear - Performed by patient: Thread/unthread right underwear leg, Thread/unthread left underwear leg, Pull underwear up/down Pants- Performed by patient: Thread/unthread right pants leg, Thread/unthread left pants leg, Pull pants up/down, Fasten/unfasten pants Pants- Performed by helper: Pull pants up/down, Thread/unthread left pants leg, Thread/unthread right pants leg Non-skid slipper socks- Performed by patient: Don/doff left sock (Rt BKA) Shoes - Performed by patient: Don/doff left shoe, Fasten left TED Hose - Performed by helper: Don/doff left TED hose Assist for footwear: Independent Assist for lower body dressing: Supervision or verbal cues, Set up Set up : Don/doff TED stockings  Function - Toileting Toileting activity did not occur: N/A Toileting steps completed by patient: Adjust clothing  prior to toileting, Performs perineal hygiene, Adjust clothing after toileting Toileting steps completed by helper: Adjust clothing prior to toileting, Performs perineal hygiene, Adjust clothing after toileting Toileting Assistive Devices: Grab bar or rail Assist level: Supervision or verbal cues, Set up/obtain supplies  Function - Air cabin crew transfer activity did not occur: N/A Toilet transfer assistive device: Drop arm commode Assist level to toilet: Touching or steadying assistance (Pt > 75%) Assist level from toilet: Touching or steadying assistance (Pt > 75%) Assist level to bedside commode (at bedside): Supervision or verbal cues Assist level from bedside commode (at bedside): Touching or steadying assistance (Pt > 75%)  Function - Chair/bed transfer Chair/bed transfer activity did not occur: N/A Chair/bed transfer method: Squat pivot Chair/bed transfer assist level: Supervision or verbal cues Chair/bed transfer assistive device: Armrests Chair/bed transfer details: Verbal cues for technique, Verbal cues for sequencing  Function - Locomotion: Wheelchair Will patient use wheelchair at discharge?: Yes Type: Manual Max wheelchair distance: 150 Assist Level: Supervision or verbal cues Assist Level: Supervision or verbal cues Assist Level: Supervision or verbal cues Turns around,maneuvers to table,bed, and toilet,negotiates 3% grade,maneuvers on rugs and over doorsills: No Function - Locomotion: Ambulation Assistive device: Walker-rolling Max distance:  4 Assist level: Touching or steadying assistance (Pt > 75%) Assist level: 2 helpers Walk 50 feet with 2 turns activity did not occur: Safety/medical concerns Walk 150 feet activity did not occur: Safety/medical concerns Walk 10 feet on uneven surfaces activity did not occur: Safety/medical concerns  Function - Comprehension Comprehension: Auditory Comprehension assist level: Follows complex conversation/direction  with extra time/assistive device  Function - Expression Expression: Verbal Expression assist level: Expresses complex ideas: With no assist  Function - Social Interaction Social Interaction assist level: Interacts appropriately with others with medication or extra time (anti-anxiety, antidepressant).  Function - Problem Solving Problem solving assist level: Solves basic problems with no assist  Function - Memory Memory assist level: More than reasonable amount of time Patient normally able to recall (first 3 days only): Current season, Location of own room, Staff names and faces, That he or she is in a hospital  Medical Problem List and Plan: 1. Functional deficits secondary to right BKA secondary to PVD/ nonhealing ulcer 05/21/2015, Mild trauma after a fall to incision site. Continue prophylaxis and Ace wrap. Will monitor hemoglobin, continue per dad's for atrial fibrillation 2.  DVT Prophylaxis/Anticoagulation: Pradaxa 3. Pain Management: Hydrocodone and Robaxin as needed. Monitor with increased mobility,  4. Acute blood loss anemia. Follow-up CBC 5. Neuropsych: This patient is capable of making decisions on his own behalf. 6. Skin/Wound Care: Routine skin checks, BKA wound is healing well without evidence of drainage 7. Fluids/Electrolytes/Nutrition: Routine I&O, eating 100% meals and has good fluid intake 8. Wound culture Proteus Mirabalis. Continue doxycycline Cipro 2 weeks initiated 05/23/2015, monitor for diarrhea 9. Hypertension/atrial flutter with ablation/nonischemic cardiomyopathy. Continue Pradaxa, clonidine 0.2 mg twice a day, Lanoxin 0.125 mg daily, Lopressor 75 mg twice a day, Minipress 1 mg twice a day. Cardiac rate controlled, BP in good range 135/82 today                10. Chronic systolic congestive heart failure. Demadex 20 mg twice a day. Monitor for any signs of fluid overload, no activity intolerance, only mild peripheral edema. Fluid intake 720 mL, output 1820  mL 11. Diabetes mellitus of peripheral neuropathy. Hemoglobin A1c 9.0. NovoLog 6 units 3 times a day, Levemir 60 units bedtime.CBGs running a bit low in the mornings, reduce Levemir 250 units at bedtime Check blood sugars before meals and at bedtime. Diabetic teaching,                                                                12. Chronic renal insufficiency. Baseline creatinine 1.58. Follow-up chemistriesshow improvement of creatinine to 1.1 13. Hyperlipidemia. Lopid/Lipitor 14. History of gout. Zyloprim 300 mg daily. Thus far no evidence for gouty flareup 15.Left shoulder pain , improved after subacromial bursa injection, we will need to build up shoulder external rotators  LOS (Days) 13 A FACE TO FACE EVALUATION WAS PERFORMED  Cenia Zaragosa E 06/05/2015, 8:54 AM

## 2015-06-05 NOTE — Progress Notes (Signed)
Occupational Therapy Session Note  Patient Details  Name: [name] MRN: [id number] Date of Birth: 1961/06/06  Today's Date: 06/05/2015 OT Individual Time: 1300-1358 OT Individual Time Calculation (min): 58 min    Skilled Therapeutic Interventions/Progress Updates:    Pt completed wheelchair mobility to the therapy gym independently.  Min assist needed for transfer squat pivot without sliding board.  Pt still reporting anterior medial clavicular pain in sitting.  Pt placed in supine with moist heat applied to the anterior clavicle region for 10 mins.  No adverse reactions to heat with pt reporting that his shoulder felt better after heat was applied.  Pt with no sign of pain in supine position with shoulder flexion and only minimal pain with shoulder horizontal adduction when reaching across his body.  Once pt returned to sitting the pain was improved but still apparent in the anterior clavicular region.  Worked on light therex exercises for sets of 10 repetitions in sitting.  Utilized light resistance band for 2 sets of shoulder extension, one with elbows in and the other with elbows out.  Pt placed in supine to perform internal and external rotation as well as shoulder flexion exercises, just using the LUE.  He was able to complete all in supine without pain.  Also had pt position himself in prone with further strengthening of scapular abduction, 2 sets of 10 repetitions with min assist.  One set with arms at his side and the second set with arms abducted to 90 degrees and elbows flexed to 90 degrees.  Pt transferred back to the wheelchair and rolled himself back to the room independently.  Pt reported no pain with wheelchair mobility back to the room.  Encouraged pt to tell nursing to bring hot pack later this evening since using it reduced the shoulder pain.   Therapy Documentation Precautions:  Precautions Precautions: Fall Restrictions Weight Bearing Restrictions: Yes RLE Weight  Bearing: Non weight bearing  Pain: Pain Assessment Pain Assessment: Faces Faces Pain Scale: Hurts a little bit Pain Location: Shoulder (clavicle) Pain Orientation: Left;Anterior Pain Descriptors / Indicators: Discomfort Pain Onset: With Activity Pain Intervention(s): Repositioned;Heat applied ADL: See Function Navigator for Current Functional Status.   Therapy/Group: Individual Therapy  [name] OTR/L 06/05/2015, 3:06 PM

## 2015-06-05 NOTE — Plan of Care (Signed)
Problem: RH Tub/Shower Transfers Goal: LTG Patient will perform tub/shower transfers w/assist (OT) LTG: Patient will perform tub/shower transfers with assist, with/without cues using equipment (OT)  Outcome: Not Applicable Date Met:  76/54/65 D/C due to not recommending shower transfers at home due to bathroom not accessible via w/c

## 2015-06-05 NOTE — Patient Care Conference (Signed)
Inpatient RehabilitationTeam Conference and Plan of Care Update Date: 06/05/2015   Time: 11:10 AM    Patient Name: [name]      Medical Record Number: [id number]  Date of Birth: 1960/09/09 Sex: Male         Room/Bed: 4W13C/4W13C-01 Payor Info: Payor: MEDICARE / Plan: MEDICARE PART A AND B / Product Type: *No Product type* /    Admitting Diagnosis: R BKA  Admit Date/Time:  05/23/2015  5:01 PM Admission Comments: No comment available   Primary Diagnosis:  Amputation of right lower extremity below knee ([place]) Principal Problem: Amputation of right lower extremity below knee [facility])  Patient Active Problem List   Diagnosis Date Noted  . Status post below knee amputation of right lower extremity ([place]) 05/23/2015  . Amputation of right lower extremity below knee ([place]) 05/23/2015  . Chronic osteomyelitis ([place]) 05/20/2015  . Right foot ulcer ([place]) 05/20/2015  . DM (diabetes mellitus) type 2, uncontrolled 05/20/2015  . Systolic dysfunction with acute on chronic heart failure ([place]) 01/25/2015  . Bladder cancer ([place])   . Non-ischemic cardiomyopathy ([place])   . Hematuria 10/14/2014  . Bladder tumor 10/14/2014  . Cellulitis   . Atrial fibrillation with RVR ([place])   . LBBB (left bundle branch block) 10/02/2013  . Anasarca 03/25/2012  . HLD (hyperlipidemia) 03/09/2012  . Lower extremity edema 12/30/2011  . Chronic systolic dysfunction of left ventricle 07/29/2011  . Chronic anticoagulation 12/10/2010  . VENOUS INSUFFICIENCY, LEGS 05/17/2009  . OBESITY-MORBID (>100') 05/06/2009  . Essential hypertension 05/06/2009  . CARDIOMYOPATHY, SECONDARY 05/06/2009  . Atrial flutter ([place]) 05/06/2009    Expected Discharge Date: Expected Discharge Date: 06/07/15  Team Members Present: Physician leading conference: Dr. Alysia Penna Social Worker Present: Ovidio Kin, LCSW Nurse Present: Dorien Chihuahua, RN PT Present: Kem Parkinson, PT OT Present: Simonne Come, OT SLP Present: Windell Moulding, SLP PPS Coordinator present : Daiva Nakayama, RN, CRRN     Current Status/Progress Goal Weekly Team Focus  Medical   shoulder pain improved, fall in PT, minor bleeding from stump  Home Mod I  d/c planning   Bowel/Bladder   continent of bowel and bladder, uses the urinal independently. LBM 10/18  Continent of bladder and bowel, continue Mod I  Mod I   Swallow/Nutrition/ Hydration             ADL's   supervision bathing and dressing with lateral leans, supervision basic transfers, mod assist shower transfer  Mod I w/c level, supervision shower transfers  activity tolerance, sit <> stand, transfers, home making tasks from w/c level   Mobility   mod I bed mobility, supervision squat pivot transfer and car transfer with transfer board,   modI bed mobility, transfers, w/c propulsion; ambulation goals d/c  LE/core strengthening, car transfers, sit <>stand for functional ADLs   Communication     na        Safety/Cognition/ Behavioral Observations    no unsafe behaviors        Pain    (L) shoulder pain, and (R) BKA. Norco q 4 hrs PRN  <4  Assess pain q 4 hrs and PRN   Skin   (R) BKA petroleum gauze, 4x4, kerlix, and ace wrap. Change dressing daily  Continue with daily (R) stump dressing change. Educate patient on stump wrapping  Discuss with patient s/s of infection. Continue to allow patient to demonstrate understanding of stump wrapping.       *See Care Plan and progress notes for long  and short-term goals.  Barriers to Discharge: fall risk    Possible Resolutions to Barriers:  home at [facility] level Mod I    Discharge Planning/Teaching Needs:  HOme alone with Mom checking daily on him. Preparing for discharge this Friday      Team Discussion:  Reaching goals of mod/i wheelchair level and ready for discharge Friday. L-shoulder pain is better from the injection last week. Continues to be weak, but activity tolerance is better. Plans to sponge bathe until he is strong enough to hop  into his bathroom.  Revisions to Treatment Plan:  None   Continued Need for Acute Rehabilitation Level of Care: The patient requires daily medical management by a physician with specialized training in physical medicine and rehabilitation for the following conditions: Daily direction of a multidisciplinary physical rehabilitation program to ensure safe treatment while eliciting the highest outcome that is of practical value to the patient.: Yes Daily medical management of patient stability for increased activity during participation in an intensive rehabilitation regime.: Yes Daily analysis of laboratory values and/or radiology reports with any subsequent need for medication adjustment of medical intervention for : Neurological problems;Post surgical problems  [name], [name] 06/05/2015, 12:55 PM

## 2015-06-06 ENCOUNTER — Inpatient Hospital Stay (HOSPITAL_COMMUNITY): Payer: Medicare Other | Admitting: Occupational Therapy

## 2015-06-06 ENCOUNTER — Inpatient Hospital Stay (HOSPITAL_COMMUNITY): Payer: Medicare Other | Admitting: Physical Therapy

## 2015-06-06 DIAGNOSIS — Z89511 Acquired absence of right leg below knee: Secondary | ICD-10-CM

## 2015-06-06 DIAGNOSIS — I482 Chronic atrial fibrillation: Secondary | ICD-10-CM

## 2015-06-06 LAB — GLUCOSE, CAPILLARY
GLUCOSE-CAPILLARY: 89 mg/dL (ref 65–99)
GLUCOSE-CAPILLARY: 62 mg/dL — AB (ref 65–99)
Glucose-Capillary: 78 mg/dL (ref 65–99)
Glucose-Capillary: 105 mg/dL — ABNORMAL HIGH (ref 65–99)
Glucose-Capillary: 143 mg/dL — ABNORMAL HIGH (ref 65–99)

## 2015-06-06 NOTE — Progress Notes (Signed)
Occupational Therapy Session Note  Patient Details  Name: [name] MRN: [id number] Date of Birth: 10-12-1960  Today's Date: 06/06/2015 OT Individual Time: 3299-2426 and 1303-1400 OT Individual Time Calculation (min): 60 min and 57 min   Short Term Goals: Week 2:  OT Short Term Goal 1 (Week 2): Pt will complete 1 grooming task in standing OT Short Term Goal 2 (Week 2): Pt will complete perineal hygiene at sit > stand with mod assist OT Short Term Goal 3 (Week 2): Pt will complete toilet transfers with supervision to drop arm BSC OT Short Term Goal 4 (Week 2): Pt will complete simple meal prep at w/c level with supervision  Skilled Therapeutic Interventions/Progress Updates:    1) Completed ADL retraining at overall modified independent level from w/c.  Pt able to correctly identify safety issues when presented with abstract errors.  Bathroom is not accessible via w/c and pt will require use of BSC.  Discussed bed and BSC setup at home and recommendation for techniques to empty BSC.    2) Treatment session with focus on functional transfers with use of slide board.  Setup mat table in therapy gym to simulate height of home bed.  Educated on positioning of w/c and BSC to practice transfers w/c > bed > drop arm BSC all with use of slide board due to incline to bed (approx 24 inches).  Pt progressed from supervision to Mod I with transfers with repetition.  Demonstrating improved safety and recall of proper placement of slide board.  Therapy Documentation Precautions:  Precautions Precautions: Fall Restrictions Weight Bearing Restrictions: Yes RLE Weight Bearing: Non weight bearing Pain: Pain Assessment Pain Assessment: 0-10 Pain Score: 3  Faces Pain Scale: Hurts a little bit Pain Type: Surgical pain Pain Location: Leg Pain Orientation: Right Pain Descriptors / Indicators: Aching Pain Onset: On-going Patients Stated Pain Goal: 0 Pain Intervention(s): Repositioned Multiple  Pain Sites: Yes 2nd Pain Site Pain Score: 5 Pain Type: Chronic pain Pain Location: Shoulder Pain Orientation: Left;Anterior Pain Descriptors / Indicators: Aching;Sore Pain Frequency: Constant Pain Onset: On-going Patient's Stated Pain Goal: 2 Pain Intervention(s): Repositioned;Rest  See Function Navigator for Current Functional Status.   Therapy/Group: Individual Therapy  [name], [place] Level 06/06/2015, 11:59 AM

## 2015-06-06 NOTE — Discharge Summary (Signed)
NAMEDUVAN, MOUSEL NO.:  [id number]  MEDICAL RECORD NO.:  [id number]  LOCATION:  4W13C                        FACILITY:  [facility]  PHYSICIAN:  Charlett Blake, M.D.DATE OF BIRTH:  02-23-1961  DATE OF ADMISSION:  05/23/2015 DATE OF DISCHARGE:  06/07/2015                              DISCHARGE SUMMARY   DISCHARGE DIAGNOSES: 1. Functional deficits secondary to right below knee amputation due to     peripheral vascular disease. 2. Pradaxa for deep vein thrombosis prophylaxis. 3. Pain management. 4. Acute blood loss anemia. 5. Hypertension with atrial flutter. 6. Diabetes mellitus, peripheral neuropathy. 7. Chronic systolic congestive heart failure. 8. Chronic renal insufficiency. 9. Hyperlipidemia. 10.Gout.  HISTORY OF PRESENT ILLNESS:  This is a 54 year old right-handed male history of hypertension, atrial flutter with ablation maintained on Pradaxa, nonischemic cardiomyopathy, chronic congestive heart failure, diabetes mellitus.  The patient lives in [place].  He used a cane and a walker prior to admission.  One level home.  Admitted May 21, 2015, with chronic right ankle foot ulcer that had progressed over the last 3 months.  X-rays of foot showed some osseous finding secondary to possible Charcot arthropathy.  Limb was not felt to be salvageable and underwent right below-knee amputation on May 21, 2015, per Dr. Sharol Given.  Hospital course, pain management as well as anemia, maintained on vancomycin and Zosyn after blood cultures at [facility] positive for Proteus.  Repeat blood cultures negative. Transition to doxycycline and Cipro orally.  Acute blood loss anemia 9.0 and monitored.  Physical and occupational therapy ongoing.  The patient was admitted for a comprehensive rehab program.  PAST MEDICAL HISTORY:  See discharge diagnoses.  SOCIAL HISTORY:  Lives alone, independent with cane walker prior to admission.  FUNCTIONAL  STATUS UPON ADMISSION TO REHAB SERVICES:  Moderate assist stand pivot transfers, moderate assist supine to sit, mod to max assist activities of daily living.  PHYSICAL EXAMINATION:  VITAL SIGNS:  Blood pressure 134/75, pulse 65, temperature 98, respirations 18. GENERAL:  This was an alert male, oriented x3.  Well developed. HEENT: Pupils round and reactive to light. LUNGS:  Clear to auscultation without wheeze. CARDIAC:  Regular rate and rhythm without murmur. ABDOMEN:  Soft, nontender.  Good bowel sounds. EXTREMITIES:  The BKA site dressed, appropriately tender.  REHABILITATION HOSPITAL COURSE:  The patient was admitted to Inpatient Rehab Services with therapies initiated on a 3-hour daily basis consisting of physical therapy, occupational therapy, and rehabilitation nursing.  The following issues were addressed during the patient's rehabilitation stay.  Pertaining to Mr. Sargent' right BKA secondary to peripheral vascular disease, staples intact, he would follow up Dr. Sharol Given.  Noted the patient did bump his surgical site on June 05, 2015, while up with therapy.  He noted some bleeding along the stump bandage afterwards that did subside with re-wrapping, placed on Cipro for wound coverage.  No other evidence of drainage and monitored.  The patient remained on Pradaxa as prior to admission.  Pain management use of hydrocodone and Robaxin.  He had completed other antibiotic courses as prior to hospital admission.  Blood pressure is well controlled, no orthostatic changes.  He exhibited no other  signs of fluid overload. Noted history of diabetes mellitus, peripheral neuropathy, hemoglobin A1c of 9, insulin therapy as indicated, full diabetic teaching.  Chronic renal insufficiency with baseline creatinine 1.58.  He would follow up with his primary MD.  The patient received weekly collaborative interdisciplinary team conferences to discuss estimated length of stay, family teaching, any  barriers to discharge.  Wheelchair mobility and controlled home environment supervision, ramp with a wheelchair initially minimal assist, progressed to supervision with multiple attempts, squat pivot transfers to bed wheelchair with supervision. Performed quadriceps strengthening, activities of daily living and homemaking.  He was able to work on sit to stand, and standing during session from various heights.  In standing, he was able to remove his hands from the walker one at a time to reach therapist's outstretched hands.  Full family teaching was completed and plan discharge to home with ongoing therapies dictated per [employer].  DISCHARGE MEDICATIONS: 1. Allopurinol 300 mg p.o. daily. 2. Lipitor 10 mg p.o. daily. 3. Cipro 500 mg p.o. b.i.d. x1 week. 4. Clonidine 0.2 mg p.o. b.i.d. 5. Pradaxa 150 mg p.o. every 12 hours. 6. Lanoxin 0.125 mg p.o. daily. 7. Fenofibrate 160 mg p.o. daily. 8. Hydrocodone 1-2 tablets every 4 hours as needed for pain. 9. Levemir insulin 50 units at bedtime. 10.Robaxin 500 mg p.o. every 6 hours as needed for muscle spasms. 11.Lopressor 75 mg p.o. b.i.d. 12.Multivitamin daily. 13.Minipress 1 mg p.o. b.i.d. 14.Demadex 20 mg p.o. b.i.d.  DIET:  Diabetic diet.  FOLLOWUP:  The patient would follow up Dr. Alysia Penna at the outpatient rehab service office to be contacted; Dr. Meridee Score in 2 weeks and call for appointment; Dr. Manuella Ghazi for medical management.     Lauraine Rinne, P.A.   ______________________________ Charlett Blake, M.D.    DA/MEDQ  D:  06/06/2015  T:  06/06/2015  Job:  [id number]  cc:   Newt Minion, MD [name], MD

## 2015-06-06 NOTE — Progress Notes (Addendum)
Physical Therapy Discharge Summary  Patient Details  Name: [name] MRN: [id number] Date of Birth: [birth year]  Today's Date: 06/06/2015 PT Individual Time: 1015-1130 PT Individual Time Calculation (min): 75 min    Patient has met 7 of 8 long term goals due to improved activity tolerance, improved balance, improved postural control, increased strength, decreased pain and ability to compensate for deficits.  Patient to discharge at a wheelchair level Modified Independent.   Patient to d/c at mod I level; does not require assistance of caregiver for daily activities. However pt does report his mother and cousin are able to provide supervision as needed.  Reasons goals not met: Standing balance with mod I unmet; pt unable to tolerate standing with RW for functional tasks due to LUE pain therefore standing balance was less of a focus during therapy sessions. Pt is discharging at w/c level for adl's and self care and does not require standing balance to be safe within the home at this time.  Recommendation:  Patient will benefit from ongoing skilled PT services in home health setting to continue to advance safe functional mobility, address ongoing impairments in UE/LE strength, activity tolerance, balance, and minimize fall risk.  Equipment: transfer board, RLE amputee pad  Reasons for discharge: treatment goals met and discharge from hospital  Patient/family agrees with progress made and goals achieved: Yes  PT Discharge Precautions/Restrictions Precautions Precautions: Fall Restrictions Weight Bearing Restrictions: Yes RLE Weight Bearing: Non weight bearing Pain Pain Assessment Pain Assessment: 0-10 Pain Score: 3  Faces Pain Scale: Hurts a little bit Pain Type: Surgical pain Pain Location: Leg Pain Orientation: Right Pain Descriptors / Indicators: Aching Pain Onset: On-going Patients Stated Pain Goal: 0 Pain Intervention(s): Repositioned Multiple Pain Sites: Yes 2nd  Pain Site Pain Score: 5 Pain Type: Chronic pain Pain Location: Shoulder Pain Orientation: Left;Anterior Pain Descriptors / Indicators: Aching;Sore Pain Frequency: Constant Pain Onset: On-going Patient's Stated Pain Goal: 2 Pain Intervention(s): Repositioned;Rest Vision/Perception  Perception Comments: WFL  Cognition Overall Cognitive Status: Within Functional Limits for tasks assessed Arousal/Alertness: Awake/alert Orientation Level: Oriented X4 Attention: Selective Selective Attention: Appears intact Memory: Appears intact Safety/Judgment: Appears intact Sensation Sensation Light Touch: Impaired Detail Light Touch Impaired Details: Impaired LLE Proprioception: Impaired Detail Proprioception Impaired Details: Impaired LLE Additional Comments: R residual WNL sensation  Coordination Gross Motor Movements are Fluid and Coordinated: Yes Fine Motor Movements are Fluid and Coordinated: Yes Heel Shin Test: NT d/t amputation Motor  Motor Motor - Discharge Observations: generalized weakness, improving speed and efficiency of movement patterns and mobility tasks for energy conservation and safety  Mobility Bed Mobility Bed Mobility: Rolling Right;Rolling Left;Supine to Sit;Sit to Supine Rolling Right: 6: Modified independent (Device/Increase time) Rolling Left: 6: Modified independent (Device/Increase time) Supine to Sit: 6: Modified independent (Device/Increase time);HOB flat Sit to Supine: 6: Modified independent (Device/Increase time);HOB flat Transfers Transfers: Yes Sit to Stand: 4: Min assist;From bed;With upper extremity assist Sit to Stand Details: Verbal cues for sequencing;Verbal cues for safe use of DME/AE;Verbal cues for technique Squat Pivot Transfers: 6: Modified independent (Device/Increase time) Locomotion  Ambulation Ambulation: Yes Ambulation/Gait Assistance: 3: Mod assist Ambulation Distance (Feet): 4 Feet Assistive device: Rolling walker Ambulation/Gait  Assistance Details: Verbal cues for technique;Verbal cues for gait pattern;Verbal cues for precautions/safety;Verbal cues for safe use of DME/AE;Tactile cues for placement;Tactile cues for posture Gait Gait: Yes Gait Pattern: Impaired Gait Pattern: Trunk flexed;Poor foot clearance - left Gait velocity: decreased for age/gender norms Stairs / Additional Locomotion Stairs: No Product manager  Mobility: Yes Wheelchair Assistance: 6: Modified independent (Device/Increase time) Environmental health practitioner: Both upper extremities Wheelchair Parts Management: Independent Distance: 175  Trunk/Postural Assessment  Cervical Assessment Cervical Assessment: Exceptions to [facility] (forward head posture) Thoracic Assessment Thoracic Assessment: Exceptions to [facility] (increased thoracic kyphosis, rounded shoulders) Lumbar Assessment Lumbar Assessment: Exceptions to [facility] (reversal of lumbar lordosis, posterior pelvic tilt) Postural Control Postural Control: Deficits on evaluation (decreased righting reactions in sitting/standing d/t generalized weakness, core strength deficits)  Balance Balance Balance Assessed: Yes Static Sitting Balance Static Sitting - Balance Support: Feet supported;No upper extremity supported Static Sitting - Level of Assistance: 6: Modified independent (Device/Increase time) Dynamic Sitting Balance Dynamic Sitting - Balance Support: Feet supported;No upper extremity supported Dynamic Sitting - Level of Assistance: 6: Modified independent (Device/Increase time) Sitting balance - Comments: seated on EOB donning shoes with mod I Static Standing Balance Static Standing - Balance Support: Bilateral upper extremity supported Static Standing - Level of Assistance: 5: Stand by assistance Dynamic Standing Balance Dynamic Standing - Balance Support: Bilateral upper extremity supported;During functional activity Dynamic Standing - Level of Assistance: 3: Mod assist Dynamic Standing  - Balance Activities: Lateral lean/weight shifting;Forward lean/weight shifting Extremity Assessment  RUE Assessment RUE Assessment: Exceptions to WFL (ROM WFL, generalized weakenss with strength grossly 4/5) LUE Assessment LUE Assessment: Exceptions to WFL (ROM WFL, generalized weakness with strength grossly 4/5.  Limited somewhat by pain at clavicular region) RLE Assessment RLE Assessment: Exceptions to [facility] (Strength 4/5 throughout; knee ROM WNL; ankle strength and ROM N/A) LLE Assessment LLE Assessment: Exceptions to [facility] (Hip flexion 4-/5, remainder 4/5 to 4+/5 throughout)  Skilled Therapeutic Intervention: Pt received seated in w/c with no c/o pain and agreeable to treatment. W/c propulsion to therapy gym with mod I. Squat pivot w/c <>nustep with supervision. Nustep x10 min with rest break ever 3-4 min. Bed mobility and furniture transfer performed with mod I. Car transfer performed with mod I. Strength and sensation assessed as above. Assessed all balance in sitting and standing; see above for details. W/c propulsion x175' with BUEs including ramp with mod I. Educated pt in bumping w/c up/down curb step so pt could instruct family/friends upon d/c home. Demonstrated once for pt and described all steps, and had pt role play as if therapist was a friend and pt had to educate and cue friend to bump up curb. Required min cues to clarify steps and educated pt on importance of caregiver safety with heavy lifting. Pt returned to room and remained seated in w/c with all needs in reach at completion of session.    See Function Navigator for Current Functional Status.  Benjiman Core Tygielski 06/06/2015, 4:02 PM

## 2015-06-06 NOTE — Progress Notes (Signed)
Occupational Therapy Discharge Summary  Patient Details  Name: [name] MRN: [id number] Date of Birth: [birth year]  Patient has met 10 of 10 long term goals due to improved activity tolerance, postural control, ability to compensate for deficits and improved awareness.  Patient to discharge at overall Modified Independent level.  Patient's care partner unavailable to provide the necessary assistance at discharge.  Patient's mother lives nearby and plans to check in on patient intermittently, but she still works.    Reasons goals not met: N/A  Recommendation:  Patient will benefit from ongoing skilled OT services in home health setting to continue to advance functional skills in the area of BADL and iADL.  Equipment: wide drop arm BSC, slide board, and amputee pad  Reasons for discharge: treatment goals met and discharge from hospital  Patient/family agrees with progress made and goals achieved: Yes  OT Discharge Precautions/Restrictions  Precautions Precautions: Fall Restrictions Weight Bearing Restrictions: Yes RLE Weight Bearing: Non weight bearing Pain Pain Assessment Pain Assessment: 0-10 Pain Score: 3  Faces Pain Scale: Hurts a little bit Pain Type: Surgical pain Pain Location: Leg Pain Orientation: Right Pain Descriptors / Indicators: Aching Pain Onset: On-going Patients Stated Pain Goal: 0 Pain Intervention(s): Repositioned Multiple Pain Sites: Yes 2nd Pain Site Pain Score: 5 Pain Type: Chronic pain Pain Location: Shoulder Pain Orientation: Left;Anterior Pain Descriptors / Indicators: Aching;Sore Pain Frequency: Constant Pain Onset: On-going Patient's Stated Pain Goal: 2 Pain Intervention(s): Repositioned;Rest ADL  See Function Navigator Vision/Perception  Vision- History Baseline Vision/History: No visual deficits Patient Visual Report: No change from baseline Vision- Assessment Vision Assessment?: No apparent visual  deficits Perception Comments: WFL  Cognition Overall Cognitive Status: Within Functional Limits for tasks assessed Arousal/Alertness: Awake/alert Orientation Level: Oriented X4 Attention: Selective Selective Attention: Appears intact Memory: Appears intact Safety/Judgment: Appears intact Sensation Sensation Light Touch: Impaired Detail Light Touch Impaired Details: Impaired LLE Proprioception: Impaired Detail Proprioception Impaired Details: Impaired LLE Additional Comments: R residual WNL sensation  Coordination Gross Motor Movements are Fluid and Coordinated: Yes Fine Motor Movements are Fluid and Coordinated: Yes Heel Shin Test: NT d/t amputation Motor  Motor Motor - Discharge Observations: generalized weakness, improving speed and efficiency of movement patterns and mobility tasks for energy conservation and safety Extremity/Trunk Assessment RUE Assessment RUE Assessment: Exceptions to WFL (ROM WFL, generalized weakenss with strength grossly 4/5) LUE Assessment LUE Assessment: Exceptions to WFL (ROM WFL, generalized weakness with strength grossly 4/5.  Limited somewhat by pain at clavicular region)   See Function Navigator for Current Functional Status.  Simonne Come 06/06/2015, 2:00 PM

## 2015-06-06 NOTE — Discharge Summary (Signed)
Discharge summary job  # [id number]

## 2015-06-06 NOTE — Progress Notes (Signed)
Subjective/Complaints: Patient states he slept well overnight.  He denies any pain complaints this morning.  ROS: Denies CP, SOB, n/v/d.   Objective: Vital Signs: Blood pressure 124/72, pulse 67, temperature 97.6 F (36.4 C), temperature source Oral, resp. rate 16, height _0  (1.803 m), weight 116.801 kg (257 lb 8 oz), SpO2 99 %. No results found. Results for orders placed or performed during the hospital encounter of 05/23/15 (from the past 72 hour(s))  Glucose, capillary     Status: Abnormal   Collection Time: 06/03/15  4:58 PM  Result Value Ref Range   Glucose-Capillary 105 (H) 65 - 99 mg/dL  Glucose, capillary     Status: Abnormal   Collection Time: 06/03/15  8:54 PM  Result Value Ref Range   Glucose-Capillary 158 (H) 65 - 99 mg/dL  Glucose, capillary     Status: None   Collection Time: 06/04/15  6:53 AM  Result Value Ref Range   Glucose-Capillary 90 65 - 99 mg/dL  Glucose, capillary     Status: None   Collection Time: 06/04/15 11:53 AM  Result Value Ref Range   Glucose-Capillary 86 65 - 99 mg/dL  Glucose, capillary     Status: Abnormal   Collection Time: 06/04/15  4:46 PM  Result Value Ref Range   Glucose-Capillary 105 (H) 65 - 99 mg/dL  Glucose, capillary     Status: Abnormal   Collection Time: 06/04/15  8:54 PM  Result Value Ref Range   Glucose-Capillary 163 (H) 65 - 99 mg/dL  Glucose, capillary     Status: None   Collection Time: 06/05/15  7:04 AM  Result Value Ref Range   Glucose-Capillary 83 65 - 99 mg/dL  CBC     Status: Abnormal   Collection Time: 06/05/15 10:32 AM  Result Value Ref Range   WBC 10.0 4.0 - 10.5 K/uL   RBC 3.45 (L) 4.22 - 5.81 MIL/uL   Hemoglobin 9.4 (L) 13.0 - 17.0 g/dL   HCT 29.8 (L) 39.0 - 52.0 %   MCV 86.4 78.0 - 100.0 fL   MCH 27.2 26.0 - 34.0 pg   MCHC 31.5 30.0 - 36.0 g/dL   RDW 19.1 (H) 11.5 - 15.5 %   Platelets 265 150 - 400 K/uL  Glucose, capillary     Status: None   Collection Time: 06/05/15 12:17 PM  Result Value Ref  Range   Glucose-Capillary 82 65 - 99 mg/dL  Glucose, capillary     Status: None   Collection Time: 06/05/15  5:08 PM  Result Value Ref Range   Glucose-Capillary 91 65 - 99 mg/dL  Glucose, capillary     Status: Abnormal   Collection Time: 06/05/15  9:20 PM  Result Value Ref Range   Glucose-Capillary 131 (H) 65 - 99 mg/dL  Glucose, capillary     Status: None   Collection Time: 06/06/15  6:43 AM  Result Value Ref Range   Glucose-Capillary 78 65 - 99 mg/dL  Glucose, capillary     Status: None   Collection Time: 06/06/15 11:30 AM  Result Value Ref Range   Glucose-Capillary 89 65 - 99 mg/dL    Gen. no acute distress. Vital signs reviewed. HEENT: Normocephalic, atraumatic, poor dentition Cardio: Irreg Irreg, no murmur Resp: CTA B/L and unlabored GI: BS positive and NT, ND Skin:   Incision C/D/I. Warm and dry. Neuro: Alert and Oriented Sensation diminished LLE Musc/Skel:   Antigravity strength throughout Right BKA  Mild stump edema Psych:  Mood and  affect are appropriate   Assessment/Plan: 1. Functional deficits secondary to right below-knee amputation secondary to peripheral vascular disease, diabetes mellitus with neuropathy which require 3+ hours per day of interdisciplinary therapy in a comprehensive inpatient rehab setting. Physiatrist is providing close team supervision and 24 hour management of active medical problems listed below. Physiatrist and rehab team continue to assess barriers to discharge/monitor patient progress toward functional and medical goals. Team conference today please see physician documentation under team conference tab, met with team face-to-face to discuss problems,progress, and goals. Formulized individual treatment plan based on medical history, underlying problem and comorbidities. Plan on discharge 06/07/2015 FIM: Function - Bathing Position: Wheelchair/chair at sink Body parts bathed by patient: Right arm, Left arm, Chest, Abdomen, Front perineal  area, Right upper leg, Left upper leg, Left lower leg, Back, Buttocks Body parts bathed by helper: Back Bathing not applicable: Right lower leg (Rt BKA) Assist Level: More than reasonable time  Function- Upper Body Dressing/Undressing What is the patient wearing?: Pull over shirt/dress Pull over shirt/dress - Perfomed by patient: Thread/unthread right sleeve, Thread/unthread left sleeve, Put head through opening, Pull shirt over trunk Pull over shirt/dress - Perfomed by helper: Pull shirt over trunk Assist Level: More than reasonable time Set up : To obtain clothing/put away Function - Lower Body Dressing/Undressing What is the patient wearing?: Underwear, Pants, Non-skid slipper socks, Shoes Position: Sitting EOB Underwear - Performed by patient: Thread/unthread right underwear leg, Thread/unthread left underwear leg, Pull underwear up/down Pants- Performed by patient: Thread/unthread right pants leg, Thread/unthread left pants leg, Pull pants up/down Pants- Performed by helper: Pull pants up/down, Thread/unthread left pants leg, Thread/unthread right pants leg Non-skid slipper socks- Performed by patient: Don/doff left sock (Rt BKA) Shoes - Performed by patient: Don/doff left shoe, Fasten left TED Hose - Performed by helper: Don/doff left TED hose Assist for footwear: Independent Assist for lower body dressing: More than reasonable time Set up : Don/doff TED stockings  Function - Toileting Toileting activity did not occur: N/A Toileting steps completed by patient: Adjust clothing prior to toileting, Performs perineal hygiene, Adjust clothing after toileting Toileting steps completed by helper: Adjust clothing prior to toileting, Performs perineal hygiene, Adjust clothing after toileting Toileting Assistive Devices: Grab bar or rail Assist level: More than reasonable time  Function - Air cabin crew transfer activity did not occur: N/A Toilet transfer assistive device: Drop  arm commode Assist level to toilet: Supervision or verbal cues Assist level from toilet: Supervision or verbal cues Assist level to bedside commode (at bedside): No Help, no cues, assistive device, takes more than a reasonable amount of time Assist level from bedside commode (at bedside): No Help, no cues, assistive device, takes more than a reasonable amount of time  Function - Chair/bed transfer Chair/bed transfer activity did not occur: N/A Chair/bed transfer method: Squat pivot Chair/bed transfer assist level: No Help, no cues, assistive device, takes more than a reasonable amount of time Chair/bed transfer assistive device: Armrests Chair/bed transfer details: Verbal cues for technique, Verbal cues for sequencing  Function - Locomotion: Wheelchair Will patient use wheelchair at discharge?: Yes Type: Manual Max wheelchair distance: 175 Assist Level: No help, No cues, assistive device, takes more than reasonable amount of time Assist Level: No help, No cues, assistive device, takes more than reasonable amount of time Assist Level: No help, No cues, assistive device, takes more than reasonable amount of time Turns around,maneuvers to table,bed, and toilet,negotiates 3% grade,maneuvers on rugs and over doorsills: Yes Function - Locomotion:  Ambulation Assistive device: Walker-rolling Max distance: 4 Assist level: Touching or steadying assistance (Pt > 75%) Assist level: 2 helpers Walk 50 feet with 2 turns activity did not occur: Safety/medical concerns Walk 150 feet activity did not occur: Safety/medical concerns Walk 10 feet on uneven surfaces activity did not occur: Safety/medical concerns  Function - Comprehension Comprehension: Auditory Comprehension assist level: Follows complex conversation/direction with extra time/assistive device  Function - Expression Expression: Verbal Expression assist level: Expresses complex ideas: With no assist  Function - Social  Interaction Social Interaction assist level: Interacts appropriately with others with medication or extra time (anti-anxiety, antidepressant).  Function - Problem Solving Problem solving assist level: Solves basic problems with no assist  Function - Memory Memory assist level: More than reasonable amount of time Patient normally able to recall (first 3 days only): Current season, Location of own room, Staff names and faces, That he or she is in a hospital  Medical Problem List and Plan: 1. Functional deficits secondary to right BKA secondary to PVD/ nonhealing ulcer 05/21/2015, Mild trauma after a fall to incision site. Continue prophylaxis and Ace wrap. Will monitor hemoglobin, continue per dad's for atrial fibrillation 2.  DVT Prophylaxis/Anticoagulation: Pradaxa 3. Pain Management: Hydrocodone and Robaxin as needed. Monitor with increased mobility,  4. Acute blood loss anemia. Hb 9.4 on 10/19, improved 5. Neuropsych: This patient is capable of making decisions on his own behalf. 6. Skin/Wound Care: Routine skin checks, BKA wound is healing well without evidence of drainage 7. Fluids/Electrolytes/Nutrition: Routine I&O, eating 100% meals and has good fluid intake 8. Wound culture Proteus Mirabalis. Continue doxycycline Cipro 2 weeks initiated 05/23/2015, monitor for diarrhea 9. Hypertension/atrial flutter with ablation/nonischemic cardiomyopathy. Continue Pradaxa, clonidine 0.2 mg twice a day, Lanoxin 0.125 mg daily, Lopressor 75 mg twice a day, Minipress 1 mg twice a day. Cardiac rate controlled, BP 144/78 today 10. Chronic systolic congestive heart failure. Demadex 20 mg twice a day. Monitor for any signs of fluid overload, no activity intolerance, only mild peripheral edema. Fluid intake 720 mL, output 1820 mL 11. Diabetes mellitus of peripheral neuropathy. Hemoglobin A1c 9.0. NovoLog 6 units 3 times a day, Levemir 60 units bedtime.CBGs running a bit low in the mornings, reduce Levemir 250  units at bedtime Check blood sugars before meals and at bedtime. Diabetic teaching,                                              12. Chronic renal insufficiency. Baseline creatinine 1.58. Follow-up chemistriesshow improvement of creatinine to 1.1 on 10/7. Will order repeat labs for tomorrow.  13. Hyperlipidemia. Lopid/Lipitor 14. History of gout. Zyloprim 300 mg daily. Thus far no evidence for gouty flareup 15.Left shoulder pain , improved after subacromial bursa injection, we will need to build up shoulder external rotators  LOS (Days) 14 A FACE TO FACE EVALUATION WAS PERFORMED  [name] 06/06/2015, 2:58 PM

## 2015-06-06 NOTE — Significant Event (Signed)
Hypoglycemic Event  CBG: 62  Treatment: 15 GM carbohydrate snack  Symptoms: None  Follow-up CBG: Time:1719 CBG Result:105  Possible Reasons for Event: Unknown  Comments/MD notified:Pam Love, PA    [place], [place]

## 2015-06-07 LAB — CBC WITH DIFFERENTIAL/PLATELET
Basophils Absolute: 0.1 10*3/uL (ref 0.0–0.1)
Basophils Relative: 1 %
EOS ABS: 0.2 10*3/uL (ref 0.0–0.7)
Eosinophils Relative: 2 %
HEMATOCRIT: 29.5 % — AB (ref 39.0–52.0)
HEMOGLOBIN: 9.1 g/dL — AB (ref 13.0–17.0)
LYMPHS ABS: 1.3 10*3/uL (ref 0.7–4.0)
LYMPHS PCT: 19 %
MCH: 26.7 pg (ref 26.0–34.0)
MCHC: 30.8 g/dL (ref 30.0–36.0)
MCV: 86.5 fL (ref 78.0–100.0)
MONOS PCT: 6 %
Monocytes Absolute: 0.4 10*3/uL (ref 0.1–1.0)
NEUTROS ABS: 4.8 10*3/uL (ref 1.7–7.7)
NEUTROS PCT: 72 %
Platelets: 225 10*3/uL (ref 150–400)
RBC: 3.41 MIL/uL — AB (ref 4.22–5.81)
RDW: 19.3 % — ABNORMAL HIGH (ref 11.5–15.5)
WBC: 6.7 10*3/uL (ref 4.0–10.5)

## 2015-06-07 LAB — BASIC METABOLIC PANEL
Anion gap: 8 (ref 5–15)
BUN: 19 mg/dL (ref 6–20)
CHLORIDE: 104 mmol/L (ref 101–111)
CO2: 30 mmol/L (ref 22–32)
CREATININE: 0.91 mg/dL (ref 0.61–1.24)
Calcium: 8.6 mg/dL — ABNORMAL LOW (ref 8.9–10.3)
GFR calc non Af Amer: >60 mL/min (ref >60)
Glucose, Bld: 78 mg/dL (ref 65–99)
POTASSIUM: 3.8 mmol/L (ref 3.5–5.1)
SODIUM: 142 mmol/L (ref 135–145)

## 2015-06-07 LAB — GLUCOSE, CAPILLARY: GLUCOSE-CAPILLARY: 93 mg/dL (ref 65–99)

## 2015-06-07 MED ORDER — CLONIDINE HCL 0.2 MG PO TABS: 0.2000 mg | ORAL_TABLET | Freq: Two times a day (BID) | ORAL | Status: DC

## 2015-06-07 MED ORDER — METHOCARBAMOL 500 MG PO TABS: 500.0000 mg | ORAL_TABLET | Freq: Four times a day (QID) | ORAL | Status: AC | PRN

## 2015-06-07 MED ORDER — DABIGATRAN ETEXILATE MESYLATE 150 MG PO CAPS: 150.0000 mg | ORAL_CAPSULE | Freq: Two times a day (BID) | ORAL | Status: DC

## 2015-06-07 MED ORDER — ADULT MULTIVITAMIN W/MINERALS CH: 1.0000 | ORAL_TABLET | Freq: Every day | ORAL | Status: AC

## 2015-06-07 MED ORDER — ATORVASTATIN CALCIUM 10 MG PO TABS: 10.0000 mg | ORAL_TABLET | Freq: Every morning | ORAL | Status: AC

## 2015-06-07 MED ORDER — DIGOXIN 125 MCG PO TABS: 0.1250 mg | ORAL_TABLET | Freq: Every day | ORAL | Status: AC

## 2015-06-07 MED ORDER — GEMFIBROZIL 600 MG PO TABS: 600.0000 mg | ORAL_TABLET | Freq: Two times a day (BID) | ORAL | Status: AC

## 2015-06-07 MED ORDER — INSULIN DETEMIR 100 UNIT/ML ~~LOC~~ SOLN: 50.0000 [IU] | Freq: Every day | SUBCUTANEOUS | Status: AC

## 2015-06-07 MED ORDER — HYDROCODONE-ACETAMINOPHEN 5-325 MG PO TABS: 1.0000 | ORAL_TABLET | ORAL | Status: AC | PRN

## 2015-06-07 MED ORDER — TORSEMIDE 20 MG PO TABS: 20.0000 mg | ORAL_TABLET | Freq: Two times a day (BID) | ORAL | Status: DC

## 2015-06-07 MED ORDER — ALLOPURINOL 300 MG PO TABS: 300.0000 mg | ORAL_TABLET | Freq: Every day | ORAL | Status: AC

## 2015-06-07 MED ORDER — PRAZOSIN HCL 1 MG PO CAPS: 1.0000 mg | ORAL_CAPSULE | Freq: Two times a day (BID) | ORAL | Status: DC

## 2015-06-07 MED ORDER — METOPROLOL TARTRATE 50 MG PO TABS: 75.0000 mg | ORAL_TABLET | Freq: Two times a day (BID) | ORAL | Status: DC

## 2015-06-07 NOTE — Progress Notes (Signed)
Subjective/Complaints: Patient states he is doing well today. He is looking forward to going home.  ROS: Denies CP, SOB, n/v/d.   Objective: Vital Signs: Blood pressure 144/85, pulse 83, temperature 97.8 F (36.6 C), temperature source Oral, resp. rate 16, height _0  (1.803 m), weight 116.801 kg (257 lb 8 oz), SpO2 99 %. No results found. Results for orders placed or performed during the hospital encounter of 05/23/15 (from the past 72 hour(s))  Glucose, capillary     Status: None   Collection Time: 06/04/15 11:53 AM  Result Value Ref Range   Glucose-Capillary 86 65 - 99 mg/dL  Glucose, capillary     Status: Abnormal   Collection Time: 06/04/15  4:46 PM  Result Value Ref Range   Glucose-Capillary 105 (H) 65 - 99 mg/dL  Glucose, capillary     Status: Abnormal   Collection Time: 06/04/15  8:54 PM  Result Value Ref Range   Glucose-Capillary 163 (H) 65 - 99 mg/dL  Glucose, capillary     Status: None   Collection Time: 06/05/15  7:04 AM  Result Value Ref Range   Glucose-Capillary 83 65 - 99 mg/dL  CBC     Status: Abnormal   Collection Time: 06/05/15 10:32 AM  Result Value Ref Range   WBC 10.0 4.0 - 10.5 K/uL   RBC 3.45 (L) 4.22 - 5.81 MIL/uL   Hemoglobin 9.4 (L) 13.0 - 17.0 g/dL   HCT 29.8 (L) 39.0 - 52.0 %   MCV 86.4 78.0 - 100.0 fL   MCH 27.2 26.0 - 34.0 pg   MCHC 31.5 30.0 - 36.0 g/dL   RDW 19.1 (H) 11.5 - 15.5 %   Platelets 265 150 - 400 K/uL  Glucose, capillary     Status: None   Collection Time: 06/05/15 12:17 PM  Result Value Ref Range   Glucose-Capillary 82 65 - 99 mg/dL  Glucose, capillary     Status: None   Collection Time: 06/05/15  5:08 PM  Result Value Ref Range   Glucose-Capillary 91 65 - 99 mg/dL  Glucose, capillary     Status: Abnormal   Collection Time: 06/05/15  9:20 PM  Result Value Ref Range   Glucose-Capillary 131 (H) 65 - 99 mg/dL  Glucose, capillary     Status: None   Collection Time: 06/06/15  6:43 AM  Result Value Ref Range   Glucose-Capillary 78 65 - 99 mg/dL  Glucose, capillary     Status: None   Collection Time: 06/06/15 11:30 AM  Result Value Ref Range   Glucose-Capillary 89 65 - 99 mg/dL  Glucose, capillary     Status: Abnormal   Collection Time: 06/06/15  4:48 PM  Result Value Ref Range   Glucose-Capillary 62 (L) 65 - 99 mg/dL  Glucose, capillary     Status: Abnormal   Collection Time: 06/06/15  5:16 PM  Result Value Ref Range   Glucose-Capillary 105 (H) 65 - 99 mg/dL  Glucose, capillary     Status: Abnormal   Collection Time: 06/06/15  9:38 PM  Result Value Ref Range   Glucose-Capillary 143 (H) 65 - 99 mg/dL  Basic metabolic panel     Status: Abnormal   Collection Time: 06/07/15  5:05 AM  Result Value Ref Range   Sodium 142 135 - 145 mmol/L   Potassium 3.8 3.5 - 5.1 mmol/L   Chloride 104 101 - 111 mmol/L   CO2 30 22 - 32 mmol/L   Glucose, Bld 78 65 -  99 mg/dL   BUN 19 6 - 20 mg/dL   Creatinine, Ser 0.91 0.61 - 1.24 mg/dL   Calcium 8.6 (L) 8.9 - 10.3 mg/dL   GFR calc non Af Amer >60 >60 mL/min   GFR calc Af Amer >60 >60 mL/min    Comment: (NOTE) The eGFR has been calculated using the CKD EPI equation. This calculation has not been validated in all clinical situations. eGFR's persistently <60 mL/min signify possible Chronic Kidney Disease.    Anion gap 8 5 - 15  CBC with Differential/Platelet     Status: Abnormal   Collection Time: 06/07/15  5:05 AM  Result Value Ref Range   WBC 6.7 4.0 - 10.5 K/uL   RBC 3.41 (L) 4.22 - 5.81 MIL/uL   Hemoglobin 9.1 (L) 13.0 - 17.0 g/dL   HCT 29.5 (L) 39.0 - 52.0 %   MCV 86.5 78.0 - 100.0 fL   MCH 26.7 26.0 - 34.0 pg   MCHC 30.8 30.0 - 36.0 g/dL   RDW 19.3 (H) 11.5 - 15.5 %   Platelets 225 150 - 400 K/uL   Neutrophils Relative % 72 %   Neutro Abs 4.8 1.7 - 7.7 K/uL   Lymphocytes Relative 19 %   Lymphs Abs 1.3 0.7 - 4.0 K/uL   Monocytes Relative 6 %   Monocytes Absolute 0.4 0.1 - 1.0 K/uL   Eosinophils Relative 2 %   Eosinophils Absolute 0.2 0.0  - 0.7 K/uL   Basophils Relative 1 %   Basophils Absolute 0.1 0.0 - 0.1 K/uL  Glucose, capillary     Status: None   Collection Time: 06/07/15  6:28 AM  Result Value Ref Range   Glucose-Capillary 93 65 - 99 mg/dL    Gen. no acute distress. Vital signs reviewed. HEENT: Normocephalic, atraumatic, poor dentition Cardio: Irreg Irreg, no murmur Resp: CTA B/L and unlabored GI: BS positive and NT, ND Skin:   Incision C/D/I. Warm and dry. Neuro: Alert and Oriented Sensation diminished LLE Musc/Skel:   Antigravity strength throughout Right BKA. C/d/i.  Mild stump edema Psych:  Mood and affect are appropriate   Assessment/Plan: 1. Functional deficits secondary to right below-knee amputation secondary to peripheral vascular disease, diabetes mellitus with neuropathy which require 3+ hours per day of interdisciplinary therapy in a comprehensive inpatient rehab setting. Physiatrist is providing close team supervision and 24 hour management of active medical problems listed below. Physiatrist and rehab team continue to assess barriers to discharge/monitor patient progress toward functional and medical goals. Team conference today please see physician documentation under team conference tab, met with team face-to-face to discuss problems,progress, and goals. Formulized individual treatment plan based on medical history, underlying problem and comorbidities. Plan on discharge 06/07/2015 FIM: Function - Bathing Position: Wheelchair/chair at sink Body parts bathed by patient: Right arm, Left arm, Chest, Abdomen, Front perineal area, Right upper leg, Left upper leg, Left lower leg, Back, Buttocks Body parts bathed by helper: Back Bathing not applicable: Right lower leg (Rt BKA) Assist Level: More than reasonable time  Function- Upper Body Dressing/Undressing What is the patient wearing?: Pull over shirt/dress Pull over shirt/dress - Perfomed by patient: Thread/unthread right sleeve, Thread/unthread  left sleeve, Put head through opening, Pull shirt over trunk Pull over shirt/dress - Perfomed by helper: Pull shirt over trunk Assist Level: More than reasonable time Set up : To obtain clothing/put away Function - Lower Body Dressing/Undressing What is the patient wearing?: Underwear, Pants, Non-skid slipper socks, Shoes Position: Sitting EOB Underwear - Performed  by patient: Thread/unthread right underwear leg, Thread/unthread left underwear leg, Pull underwear up/down Pants- Performed by patient: Thread/unthread right pants leg, Thread/unthread left pants leg, Pull pants up/down Pants- Performed by helper: Pull pants up/down, Thread/unthread left pants leg, Thread/unthread right pants leg Non-skid slipper socks- Performed by patient: Don/doff left sock (Rt BKA) Shoes - Performed by patient: Don/doff left shoe, Fasten left TED Hose - Performed by helper: Don/doff left TED hose Assist for footwear: Independent Assist for lower body dressing: More than reasonable time Set up : Don/doff TED stockings  Function - Toileting Toileting activity did not occur: N/A Toileting steps completed by patient: Adjust clothing prior to toileting, Performs perineal hygiene, Adjust clothing after toileting Toileting steps completed by helper: Adjust clothing prior to toileting, Performs perineal hygiene, Adjust clothing after toileting Toileting Assistive Devices: Grab bar or rail Assist level: More than reasonable time  Function - Air cabin crew transfer activity did not occur: N/A Toilet transfer assistive device: Drop arm commode Assist level to toilet: Supervision or verbal cues Assist level from toilet: Supervision or verbal cues Assist level to bedside commode (at bedside): No Help, no cues, assistive device, takes more than a reasonable amount of time Assist level from bedside commode (at bedside): No Help, no cues, assistive device, takes more than a reasonable amount of time  Function  - Chair/bed transfer Chair/bed transfer activity did not occur: N/A Chair/bed transfer method: Squat pivot Chair/bed transfer assist level: No Help, no cues, assistive device, takes more than a reasonable amount of time Chair/bed transfer assistive device: Armrests Chair/bed transfer details: Verbal cues for technique, Verbal cues for sequencing  Function - Locomotion: Wheelchair Will patient use wheelchair at discharge?: Yes Type: Manual Max wheelchair distance: 175 Assist Level: No help, No cues, assistive device, takes more than reasonable amount of time Assist Level: No help, No cues, assistive device, takes more than reasonable amount of time Assist Level: No help, No cues, assistive device, takes more than reasonable amount of time Turns around,maneuvers to table,bed, and toilet,negotiates 3% grade,maneuvers on rugs and over doorsills: Yes Function - Locomotion: Ambulation Assistive device: Walker-rolling Max distance: 4 Assist level: Touching or steadying assistance (Pt > 75%) Assist level: 2 helpers Walk 50 feet with 2 turns activity did not occur: Safety/medical concerns Walk 150 feet activity did not occur: Safety/medical concerns Walk 10 feet on uneven surfaces activity did not occur: Safety/medical concerns  Function - Comprehension Comprehension: Auditory Comprehension assist level: Follows complex conversation/direction with extra time/assistive device  Function - Expression Expression: Verbal Expression assist level: Expresses complex ideas: With no assist  Function - Social Interaction Social Interaction assist level: Interacts appropriately with others with medication or extra time (anti-anxiety, antidepressant).  Function - Problem Solving Problem solving assist level: Solves basic problems with no assist  Function - Memory Memory assist level: More than reasonable amount of time Patient normally able to recall (first 3 days only): Current season, Location of  own room, Staff names and faces, That he or she is in a hospital  Medical Problem List and Plan: 1. Functional deficits secondary to right BKA secondary to PVD/ nonhealing ulcer 05/21/2015, Mild trauma after a fall to incision site. Continue prophylaxis and Ace wrap. Will monitor hemoglobin, continue per dad's for atrial fibrillation  -Discharge today 2.  DVT Prophylaxis/Anticoagulation: Pradaxa 3. Pain Management: Hydrocodone and Robaxin as needed. Monitor with increased mobility,  4. Acute blood loss anemia. Hb 9.1 on 10/21, stable 5. Neuropsych: This patient is capable of making decisions  on his own behalf. 6. Skin/Wound Care: Routine skin checks, BKA wound is healing well without evidence of drainage 7. Fluids/Electrolytes/Nutrition: Routine I&O, eating 100% meals and has good fluid intake 8. Wound culture Proteus Mirabalis. Continue doxycycline Cipro 2 weeks initiated 05/23/2015, monitor for diarrhea 9. Hypertension/atrial flutter with ablation/nonischemic cardiomyopathy. Continue Pradaxa, clonidine 0.2 mg twice a day, Lanoxin 0.125 mg daily, Lopressor 75 mg twice a day, Minipress 1 mg twice a day. Cardiac rate controlled, BP 144/85 today 10. Chronic systolic congestive heart failure. Demadex 20 mg twice a day. Monitor for any signs of fluid overload, no activity intolerance, only mild peripheral edema. Fluid intake 720 mL, output 1820 mL 11. Diabetes mellitus of peripheral neuropathy. Hemoglobin A1c 9.0. NovoLog 6 units 3 times a day, Levemir 60 units bedtime.CBGs running a bit low in the mornings, reduce Levemir 250 units at bedtime Check blood sugars before meals and at bedtime. Diabetic teaching,                                              12. Chronic renal insufficiency. Baseline creatinine 1.58. Follow-up chemistriesshow improvement of creatinine to 0.91 on 10/21. Improved.  13. Hyperlipidemia. Lopid/Lipitor 14. History of gout. Zyloprim 300 mg daily. Thus far no evidence for gouty  flareup 15.Left shoulder pain , improved after subacromial bursa injection, we will need to build up shoulder external rotators  LOS (Days) 15 A FACE TO FACE EVALUATION WAS PERFORMED  [name] [name] 06/07/2015, 11:40 AM

## 2015-06-07 NOTE — Progress Notes (Signed)
Social Work  Discharge Note  The overall goal for the admission was met for:   Discharge location: Yes-HOME WITH INTERMITTENT ASSIST FROM MOM AND FRIENDS  Length of Stay: Yes-15 DAYS  Discharge activity level: Yes-MOD/I [place]  Home/community participation: Yes  Services provided included: MD, RD, PT, OT, RN, CM, TR, Pharmacy and SW  Financial Services: Medicare and Medicaid  Follow-up services arranged: Home Health: [facility] CARE-PT,OT,RN, DME: [place], [facility]IDE DROP-ARN BEDSIDE COMMODE AND R-AMPUTEE PAD [place] and Patient/Family has no preference for HH/DME agencies  Comments (or additional information):PT REACHED MOD/I LEVEL FROM A WHEELCHAIR LEVEL, WILL HAVE MOM [place]. HHPT TO PLACE AMPUTEE PAD ON WHEELCHAIR AT HOME. IF ISSUE PT KNOWS TO CONTACT THIS WORKER.   Patient/Family verbalized understanding of follow-up arrangements: Yes  Individual responsible for coordination of the follow-up plan: SELF & SARAH-MOM  Confirmed correct DME delivered: Elease Hashimoto 06/07/2015    Elease Hashimoto

## 2015-06-07 NOTE — Progress Notes (Signed)
Patient discharged top home.

## 2015-06-10 ENCOUNTER — Telehealth: Payer: Self-pay | Admitting: Physical Medicine & Rehabilitation

## 2015-06-10 NOTE — Telephone Encounter (Signed)
Spoke with pharmacy. Advised that we do not manage diabetes and they would need to contact patients primary or endocrine provider.

## 2015-06-10 NOTE — Telephone Encounter (Signed)
[name] with [name] needs to get Levemir refilled-but needs it in flexpens.  Please call her at [phone].

## 2015-06-11 ENCOUNTER — Encounter: Payer: Self-pay | Admitting: Cardiology

## 2015-06-12 ENCOUNTER — Ambulatory Visit (INDEPENDENT_AMBULATORY_CARE_PROVIDER_SITE_OTHER): Payer: Medicare Other

## 2015-06-12 ENCOUNTER — Encounter: Payer: Self-pay | Admitting: Physical Medicine & Rehabilitation

## 2015-06-12 DIAGNOSIS — I5023 Acute on chronic systolic (congestive) heart failure: Secondary | ICD-10-CM | POA: Diagnosis not present

## 2015-06-12 DIAGNOSIS — Z95810 Presence of automatic (implantable) cardiac defibrillator: Secondary | ICD-10-CM

## 2015-06-12 NOTE — Progress Notes (Addendum)
EPIC Encounter for ICM Monitoring  Patient Name: [name] is a 54 y.o. male Date: 06/12/2015 Primary Care Physican: [name], MD Primary Cardiologist: Bronson Ing Electrophysiologist: Allred Dry Weight: Unknown - unable to weigh       Bi-V Pacing 93.4% (was 98.3% in 03/2015)  In the past month, have you:  1. Gained more than 2 pounds in a day or more than 5 pounds in a week? no  2. Had changes in your medications (with verification of current medications)? Spironolactone stopped.  3. Had more shortness of breath than is usual for you? no  4. Limited your activity because of shortness of breath? no  5. Not been able to sleep because of shortness of breath? no  6. Had increased swelling in your feet or ankles? 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  Non operative leg swollen and he stated when pressing on his leg the indentation stays.   7. Had symptoms of dehydration (dizziness, dry mouth, increased thirst, decreased urine output) no  8. Had changes in sodium restriction? no  9. Been compliant with medication? Yes   ICM trend: 06/12/2015 2  Follow-up plan: ICM clinic phone appointment on 06/18/2015.  Optivol fluid index above baseline starting ~04/10/2015 to 06/12/2015 (transmission sent) and impedance is below for same time periods.  Patient hospitalized early August for BKA followed by inpatient rehab.  He has had anemia since hospitalization and last Hgb on 06/07/2015 was 9.1 (L) and Creatinine .91 on 06/12/2015 and within normal limits since 03/13/2015 except on 05/20/2015 to 05/22/2015.  Patient denied any difficulty breathing.  He stated he is trying to stay positive since the amputation.  Patient unable to weigh at this time.    Reviewed Optivol with [name].  Recommendation patient double Torsemide x 3 days which dosage will be Torsemide 20mg  - 2 tablets bid (total 80mg  ).  Recommendation to make an appointment with [name] in the  next couple of weeks.  Repeat transmission on 06/18/2015.   Attempted call to patient for recommendations and no answer.        Patient notified to take Torsemide 20mg  - 2 tablets bid x 3 days and after 3rd day resume prescribed dosage of Torsemide 20mg  - 1 tablet bid.   Also recommended he call [name] to get an appointment in the next 2 weeks.         He reported that prior to hospitalization he was taking Torsemide 40mg  AM and 20 mg PM for total of 60 mg per day but was changed when he was discharged.  Advised I would provide the information to [name] and        [name], if any further recommendations about prescribed dosage, will call him back.   Copy of note sent to patient's primary care physician, primary cardiologist, and device following physician.  Rosalene Billings, RN, CCM 06/12/2015          Agree with your recommendations. Will reassess in clinic.         Dr. Bronson Ing        ----- Message -----     From: Rosalene Billings, RN     Sent: 06/12/2015  3:11 PM      To: Herminio Commons, MD         12:15 PM

## 2015-06-18 ENCOUNTER — Ambulatory Visit (INDEPENDENT_AMBULATORY_CARE_PROVIDER_SITE_OTHER): Payer: Medicare Other

## 2015-06-18 ENCOUNTER — Telehealth: Payer: Self-pay | Admitting: Cardiology

## 2015-06-18 DIAGNOSIS — Z95810 Presence of automatic (implantable) cardiac defibrillator: Secondary | ICD-10-CM

## 2015-06-18 DIAGNOSIS — I5022 Chronic systolic (congestive) heart failure: Secondary | ICD-10-CM

## 2015-06-18 NOTE — Telephone Encounter (Signed)
LMOVM reminding pt to send remote transmission.   

## 2015-06-19 MED ORDER — SPIRONOLACTONE 25 MG PO TABS: 25.0000 mg | ORAL_TABLET | Freq: Every day | ORAL | Status: DC

## 2015-06-19 NOTE — Addendum Note (Signed)
Addended by: Rosalene Billings on: 06/19/2015 02:20 PM   Modules accepted: Orders

## 2015-06-19 NOTE — Progress Notes (Addendum)
EPIC Encounter for ICM Monitoring  Patient Name: [name] is a 54 y.o. male Date: 06/19/2015 Primary Care Physican: [name], MD Primary Cardiologist: Bronson Ing Electrophysiologist: Allred Dry Weight: Unknown, unable to weigh dur to recent amputation       Bi-V Pacing 90.5% (was 93.4% at last transmission)  In the past month, have you:  1. Gained more than 2 pounds in a day or more than 5 pounds in a week? Unknown  2. Had changes in your medications (with verification of current medications)? Patient reported he was not given Spironolactone and Valsartan during hospitalization and was not on his hospital discharge orders.  He is unsure if those meds should have been stopped. Torsemide was ordered 20 mg bid at time of discharge but prior to surgery he was taking 40 mg in the am and 20 mg in evening.    3. Had more shortness of breath than is usual for you? no  4. Limited your activity because of shortness of breath? no  5. Not been able to sleep because of shortness of breath? no  6. Had increased swelling in your feet or ankles? Yes, he continues to have swelling in left leg and foot and above stump on right leg.  7. Had symptoms of dehydration (dizziness, dry mouth, increased thirst, decreased urine output) no  8. Had changes in sodium restriction? no  9. Been compliant with medication? Yes   ICM trend: 06/18/2015   Follow-up plan: ICM clinic phone appointment 06/26/2015 for repeat transmission.  Optivol fluid index continues to be above baseline since ~ 04/11/2015.  Optivol impedance below baseline since ~ 04/11/2015 and no change after taking 3 days of extra Torsemide on 06/12/2015 to 06/15/2015.  He denied any shortness of breath or chest congestion.  He has appointment with [name] on 06/27/2015 for follow.    Advised I forward information for review and call him back with any recommendations.  Patient concerned Spironolactone and Valsartan were discontinued at  hospital discharge and Torsemide was decreased to 20 mg bid at time of discharge (prior to hospitalization was taking 40mg  in am and 20mg  pm).  Will repeat Optivol in one week, on 06/26/2015.   Copy of note sent to patient's primary care physician, primary cardiologist, and device following physician.  Rosalene Billings, RN, CCM 06/19/2015 11:49 AM   Optivol reviewed with [name] in office.  He recommended Spironolactone 25 mg - take 1 tablet daily by mouth and follow up [name] at the office visit scheduled on 06/27/2015.  Patient notified and informed to take Spironolactone 25mg  - 1 tablet by mouth daily and verbalized understanding.

## 2015-06-27 ENCOUNTER — Encounter: Payer: Self-pay | Admitting: Cardiovascular Disease

## 2015-06-27 ENCOUNTER — Ambulatory Visit (INDEPENDENT_AMBULATORY_CARE_PROVIDER_SITE_OTHER): Payer: Medicare Other

## 2015-06-27 ENCOUNTER — Ambulatory Visit (INDEPENDENT_AMBULATORY_CARE_PROVIDER_SITE_OTHER): Payer: Medicare Other | Admitting: Cardiovascular Disease

## 2015-06-27 VITALS — BP 102/88 | HR 64 | Ht 71.0 in | Wt 260.0 lb

## 2015-06-27 DIAGNOSIS — I4892 Unspecified atrial flutter: Secondary | ICD-10-CM

## 2015-06-27 DIAGNOSIS — I5023 Acute on chronic systolic (congestive) heart failure: Secondary | ICD-10-CM

## 2015-06-27 DIAGNOSIS — Z7901 Long term (current) use of anticoagulants: Secondary | ICD-10-CM | POA: Diagnosis not present

## 2015-06-27 DIAGNOSIS — I10 Essential (primary) hypertension: Secondary | ICD-10-CM

## 2015-06-27 DIAGNOSIS — I482 Chronic atrial fibrillation, unspecified: Secondary | ICD-10-CM

## 2015-06-27 DIAGNOSIS — Z95810 Presence of automatic (implantable) cardiac defibrillator: Secondary | ICD-10-CM

## 2015-06-27 DIAGNOSIS — R609 Edema, unspecified: Secondary | ICD-10-CM

## 2015-06-27 NOTE — Progress Notes (Signed)
EPIC Encounter for ICM Monitoring  Patient Name: [name] is a 54 y.o. male Date: 06/27/2015 Primary Care Physican: [name], MD Primary Cardiologist: Bronson Ing Electrophysiologist: Allred Dry Weight: unable to weigh  Bi-V Pacing 98%       In the past month, have you:  1. Gained more than 2 pounds in a day or more than 5 pounds in a week? no  2. Had changes in your medications (with verification of current medications)? no  3. Had more shortness of breath than is usual for you? no  4. Limited your activity because of shortness of breath? no  5. Not been able to sleep because of shortness of breath? no  6. Had increased swelling in your feet or ankles? Yes, continues to have swelling in one leg and above stump  7. Had symptoms of dehydration (dizziness, dry mouth, increased thirst, decreased urine output) no  8. Had changes in sodium restriction? no  9. Been compliant with medication? Yes   ICM trend:  06/26/2015 (year view)  90 day Zoom View          Follow-up plan: ICM clinic phone appointment 07/16/2015.   Provided Optivol year and 90 day zoom view.  Optivol improving since last transmission on 06/18/2015 and Spironolactone was started.  Patient reported the swelling in one leg and above stump remain unchanged from 06/18/2015.  He denied any SOB and unable to weigh.  He has appointment with [name] today at 1:40PM.    Copy of note sent to patient's primary care physician, primary cardiologist, and device following physician.  Rosalene Billings, RN, CCM 06/27/2015 11:54 AM

## 2015-06-27 NOTE — Progress Notes (Signed)
Patient ID: [name], male   DOB: August 06, 1961, 54 y.o.   MRN: [id number]      SUBJECTIVE: The patient returns for follow up for chronic systolic heart failure and has a CRT-D system in place. Echocardiogram performed on 04/24/14 demonstrated moderate to severely reduced left ventricular systolic function, EF [id number], grade 3 diastolic dysfunction and aortic sclerosis but no significant stenosis.  Device interrogation on 05/06/15 showed normal device function with no mode switches or ventricular arrhythmias. Biventricular pacing occurred 90.5% of the time.  Wt 260 lbs 9269 on 04/16/15).  Unfortunately, he underwent right below the knee application on [id number] for peripheral vascular disease. Over the past several weeks he has developed progressive left leg swelling. Denies shortness of breath.  Review of Systems: As per "subjective", otherwise negative.  No Known Allergies  Current Outpatient Prescriptions  Medication Sig Dispense Refill  . allopurinol (ZYLOPRIM) 300 MG tablet Take 1 tablet (300 mg total) by mouth daily. 30 tablet 5  . atorvastatin (LIPITOR) 10 MG tablet Take 1 tablet (10 mg total) by mouth every morning. 30 tablet 1  . cloNIDine (CATAPRES) 0.2 MG tablet Take 1 tablet (0.2 mg total) by mouth 2 (two) times daily. 60 tablet 1  . COLCRYS 0.6 MG tablet Take 0.6 mg by mouth daily as needed (gout).     . dabigatran (PRADAXA) 150 MG CAPS capsule Take 1 capsule (150 mg total) by mouth 2 (two) times daily. 60 capsule 6  . digoxin (LANOXIN) 0.125 MG tablet Take 1 tablet (0.125 mg total) by mouth daily. 30 tablet 1  . gemfibrozil (LOPID) 600 MG tablet Take 1 tablet (600 mg total) by mouth 2 (two) times daily before a meal. 60 tablet 1  . HYDROcodone-acetaminophen (NORCO/VICODIN) 5-325 MG tablet Take 1-2 tablets by mouth every 4 (four) hours as needed for moderate pain. 90 tablet 0  . insulin detemir (LEVEMIR) 100 UNIT/ML injection Inject 0.5 mLs (50 Units total) into the skin at bedtime.  10 mL 11  . methocarbamol (ROBAXIN) 500 MG tablet Take 1 tablet (500 mg total) by mouth every 6 (six) hours as needed for muscle spasms. 60 tablet 0  . metoprolol (LOPRESSOR) 50 MG tablet Take 1.5 tablets (75 mg total) by mouth 2 (two) times daily. 180 tablet 3  . Multiple Vitamin (MULTIVITAMIN WITH MINERALS) TABS tablet Take 1 tablet by mouth daily.    . prazosin (MINIPRESS) 1 MG capsule Take 1 capsule (1 mg total) by mouth 2 (two) times daily. 60 capsule 6  . spironolactone (ALDACTONE) 25 MG tablet Take 1 tablet (25 mg total) by mouth daily. 90 tablet 3  . torsemide (DEMADEX) 20 MG tablet Take 1 tablet (20 mg total) by mouth 2 (two) times daily. 60 tablet 1   No current facility-administered medications for this visit.    Past Medical History  Diagnosis Date  . Hypertension   . Morbid obesity ([place])   . Atrial flutter ([place])  10/01/2006, 12/30/11    Status post RF ablation, 7/13, Dr. [name]  . OSA (obstructive sleep apnea)     compliant with CPAP  . Nonischemic cardiomyopathy ([place])     Normal coronary arteries, 2008  . Chronic systolic dysfunction of left ventricle     EF 20-25%, global HK; mild RVD, 2-D echo, 5/13  . Unspecified disorder of kidney and ureter   . RBBB, anterior fascicular block and incomplete LBBB   . Pulmonary hypertension ([facility])     RVSP 50-55 mmHg, 5/13  . Valvular heart  disease     Mild MR/TR, 2-D echo, 5/13  . Type II or unspecified type diabetes mellitus without mention of complication, not stated as uncontrolled     Past Surgical History  Procedure Laterality Date  . Bi-ventricular implantable cardioverter defibrillator  (crt-d)  10/27/2013    MDT [name] XT CRTD implanted by [name] for NICM, CHF  . Atrial flutter ablation N/A 03/04/2012    Procedure: ATRIAL FLUTTER ABLATION;  Surgeon: Thompson Grayer, MD;  Location: [facility] CATH LAB;  Service: Cardiovascular;  Laterality: N/A;  . Bi-ventricular implantable cardioverter defibrillator N/A 10/27/2013    Procedure:  BI-VENTRICULAR IMPLANTABLE CARDIOVERTER DEFIBRILLATOR  (CRT-D);  Surgeon: Coralyn Mark, MD;  Location: [facility] CATH LAB;  Service: Cardiovascular;  Laterality: N/A;  . Cystoscopy/retrograde/ureteroscopy Bilateral 10/16/2014    Procedure: Kathrene Alu WITH BILATERAL Freddie Breech WITH GYRUS;  Surgeon: Malka So, MD;  Location: [facility];  Service: Urology;  Laterality: Bilateral;  . Transurethral resection of bladder tumor with gyrus (turbt-gyrus) N/A 10/16/2014    Procedure: TRANSURETHRAL RESECTION OF BLADDER TUMOR WITH GYRUS (TURBT-GYRUS);  Surgeon: Malka So, MD;  Location: [facility];  Service: Urology;  Laterality: N/A;  . Cataract extraction w/phaco Right 03/25/2015    Procedure: CATARACT EXTRACTION PHACO AND INTRAOCULAR LENS PLACEMENT RIGHT EYE CDE=12.24;  Surgeon: Tonny Branch, MD;  Location: [facility];  Service: Ophthalmology;  Laterality: Right;  . Amputation Right 05/21/2015    Procedure: AMPUTATION BELOW KNEE;  Surgeon: Newt Minion, MD;  Location: [facility];  Service: Orthopedics;  Laterality: Right;    Social History   Social History  . Marital Status: Married    Spouse Name: N/A  . Number of Children: N/A  . Years of Education: N/A   Occupational History  . DISABLED    Social History Main Topics  . Smoking status: Never Smoker   . Smokeless tobacco: Never Used  . Alcohol Use: No  . Drug Use: No  . Sexual Activity: No   Other Topics Concern  . Not on file   Social History Narrative   Pt lives in [place] alone.  Disabled.  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     Filed Vitals:   06/27/15 1331  BP: 102/88  Pulse: 64  Height: 5\' 11"  (1.803 m)  Weight: 260 lb (117.935 kg)  SpO2: 95%    PHYSICAL EXAM General: NAD HEENT: Normal. Neck: No JVD, no thyromegaly. Lungs: Clear to auscultation bilaterally with normal respiratory effort. CV: Regular rate and rhythm, normal S1/S2, no [id number], 1/6 systolic murmur along left sternal border and over RUSB. Right leg BKA. Left leg  with tense 2+ pitting edema. Abdomen: Soft, nontender, obese, mild distention.  Neurologic: Alert and oriented x 3.  Psych: Normal affect. Skin: Stasis dermatitis of left leg. Musculoskeletal: Right BKA.  ECG: Most recent ECG reviewed.      ASSESSMENT AND PLAN: 1. Acute on chronic systolic heart failure/nonischemic cardiomyopathy s/p CRTD, EF 35%: Device interrogation noted above. Increase torsemide to 40 mg bid x 5 days. Check BMET 11/14. Have him f/u with PA on 11/16.  2. Essential HTN: Controlled. No changes. Had been on Diovan.  3. Aortic sclerosis: Echo results noted above with aortic valve sclerosis without stenosis.   4. Atrial fibrillation: HR controlled and CRT-D in place. Continue Pradaxa. No bleeding problems.  Dispo: f/u 11/16 with M. Bonnell Public PA-C.  Kate Sable, M.D., F.A.C.C.

## 2015-06-27 NOTE — Patient Instructions (Signed)
   Increase your Torsemide to 40mg  twice a day  X 5 days, then resume previous dose. Continue all other medications.   Lab for BMET - do on Monday, 07/01/15 - order given today. Follow up with Estella Husk, PA next Wednesday in [place] office.

## 2015-07-03 ENCOUNTER — Encounter: Payer: Self-pay | Admitting: Physician Assistant

## 2015-07-03 ENCOUNTER — Ambulatory Visit (INDEPENDENT_AMBULATORY_CARE_PROVIDER_SITE_OTHER): Payer: Medicare Other | Admitting: Physician Assistant

## 2015-07-03 ENCOUNTER — Other Ambulatory Visit: Payer: Self-pay | Admitting: Urology

## 2015-07-03 ENCOUNTER — Encounter: Payer: Self-pay | Admitting: *Deleted

## 2015-07-03 VITALS — BP 110/70 | HR 63 | Ht 71.0 in

## 2015-07-03 DIAGNOSIS — I4892 Unspecified atrial flutter: Secondary | ICD-10-CM

## 2015-07-03 DIAGNOSIS — I519 Heart disease, unspecified: Secondary | ICD-10-CM | POA: Diagnosis not present

## 2015-07-03 DIAGNOSIS — N3289 Other specified disorders of bladder: Secondary | ICD-10-CM

## 2015-07-03 DIAGNOSIS — I10 Essential (primary) hypertension: Secondary | ICD-10-CM | POA: Diagnosis not present

## 2015-07-03 MED ORDER — TORSEMIDE 20 MG PO TABS: 20.0000 mg | ORAL_TABLET | Freq: Two times a day (BID) | ORAL | Status: DC

## 2015-07-03 NOTE — Addendum Note (Signed)
Addended by: Levonne Hubert on: 07/03/2015 02:29 PM   Modules accepted: Orders

## 2015-07-03 NOTE — Progress Notes (Signed)
Cardiology Office Note   Date:  07/03/2015   ID:  [name], DOB 1961-04-04, MRN [id number]  PCP:  [name], MD  Cardiologist: Dr. Bronson Ing  Chief Complaint: Edema    History of Present Illness: [name] is a 54 y.o. male who presents for follow-up of CHF.he saw Dr.Koneswaran on 06/27/15 was having worsening edema.he had had right below the knee amputation 05/21/15 for peripheral vascular disease. He then developed several weeks of left leg swelling without shortness of breath.his torsemide was increased to 40 mg twice a day for 5 days.that same day his ICM clinic phone appointment showed his Optivol improving since last transmission 06/18/15 when spironolactone was added.  Patient has history of chronic systolic heart failure/nonischemic cardiomyopathy status post CRT-D, EF 35% on 2-D echo 04/2014 with grade 3 diastolic dysfunction and aortic sclerosis but no stenosis. He also has chronic atrial fibrillation and is on Pradaxa.  Patient comes in today feeling much better. His swelling is down and he is wearing a compression hose on his left leg. He is not able to weigh anymore because of his amputation. Were not sure what his baseline weight is at this point. His breathing is normal. He had a bmet drawn at [facility] 2 days ago and we are trying to get the results of.   Wt Readings from Last 3 Encounters:  06/27/15 260 lb (117.935 kg)  05/29/15 257 lb 8 oz (116.801 kg)  05/21/15 250 lb 8 oz (113.626 kg)      Past Medical History  Diagnosis Date  . Hypertension   . Morbid obesity ([place])   . Atrial flutter ([place])  10/01/2006, 12/30/11    Status post RF ablation, 7/13, Dr. [name]  . OSA (obstructive sleep apnea)     compliant with CPAP  . Nonischemic cardiomyopathy ([place])     Normal coronary arteries, 2008  . Chronic systolic dysfunction of left ventricle     EF 20-25%, global HK; mild RVD, 2-D echo, 5/13  . Unspecified disorder of kidney and ureter   . RBBB, anterior  fascicular block and incomplete LBBB   . Pulmonary hypertension (H[facility])     RVSP 50-55 mmHg, 5/13  . Valvular heart disease     Mild MR/TR, 2-D echo, 5/13  . Type II or unspecified type diabetes mellitus without mention of complication, not stated as uncontrolled     Past Surgical History  Procedure Laterality Date  . Bi-ventricular implantable cardioverter defibrillator  (crt-d)  10/27/2013    MDT [name] XT CRTD implanted by [name] for NICM, CHF  . Atrial flutter ablation N/A 03/04/2012    Procedure: ATRIAL FLUTTER ABLATION;  Surgeon: Thompson Grayer, MD;  Location: [facility] CATH LAB;  Service: Cardiovascular;  Laterality: N/A;  . Bi-ventricular implantable cardioverter defibrillator N/A 10/27/2013    Procedure: BI-VENTRICULAR IMPLANTABLE CARDIOVERTER DEFIBRILLATOR  (CRT-D);  Surgeon: Coralyn Mark, MD;  Location: [facility] CATH LAB;  Service: Cardiovascular;  Laterality: N/A;  . Cystoscopy/retrograde/ureteroscopy Bilateral 10/16/2014    Procedure: Kathrene Alu WITH BILATERAL Freddie Breech WITH GYRUS;  Surgeon: Malka So, MD;  Location: [facility];  Service: Urology;  Laterality: Bilateral;  . Transurethral resection of bladder tumor with gyrus (turbt-gyrus) N/A 10/16/2014    Procedure: TRANSURETHRAL RESECTION OF BLADDER TUMOR WITH GYRUS (TURBT-GYRUS);  Surgeon: Malka So, MD;  Location: [facility];  Service: Urology;  Laterality: N/A;  . Cataract extraction w/phaco Right 03/25/2015    Procedure: CATARACT EXTRACTION PHACO AND INTRAOCULAR LENS PLACEMENT RIGHT EYE CDE=12.24;  Surgeon:  Tonny Branch, MD;  Location: [facility];  Service: Ophthalmology;  Laterality: Right;  . Amputation Right 05/21/2015    Procedure: AMPUTATION BELOW KNEE;  Surgeon: Newt Minion, MD;  Location: [facility];  Service: Orthopedics;  Laterality: Right;     Current Outpatient Prescriptions  Medication Sig Dispense Refill  . allopurinol (ZYLOPRIM) 300 MG tablet Take 1 tablet (300 mg total) by mouth daily. 30 tablet 5  . atorvastatin (LIPITOR) 10 MG  tablet Take 1 tablet (10 mg total) by mouth every morning. 30 tablet 1  . cloNIDine (CATAPRES) 0.2 MG tablet Take 1 tablet (0.2 mg total) by mouth 2 (two) times daily. 60 tablet 1  . COLCRYS 0.6 MG tablet Take 0.6 mg by mouth daily as needed (gout).     . dabigatran (PRADAXA) 150 MG CAPS capsule Take 1 capsule (150 mg total) by mouth 2 (two) times daily. 60 capsule 6  . digoxin (LANOXIN) 0.125 MG tablet Take 1 tablet (0.125 mg total) by mouth daily. 30 tablet 1  . gemfibrozil (LOPID) 600 MG tablet Take 1 tablet (600 mg total) by mouth 2 (two) times daily before a meal. 60 tablet 1  . HYDROcodone-acetaminophen (NORCO/VICODIN) 5-325 MG tablet Take 1-2 tablets by mouth every 4 (four) hours as needed for moderate pain. 90 tablet 0  . insulin detemir (LEVEMIR) 100 UNIT/ML injection Inject 0.5 mLs (50 Units total) into the skin at bedtime. 10 mL 11  . methocarbamol (ROBAXIN) 500 MG tablet Take 1 tablet (500 mg total) by mouth every 6 (six) hours as needed for muscle spasms. 60 tablet 0  . metoprolol (LOPRESSOR) 50 MG tablet Take 1.5 tablets (75 mg total) by mouth 2 (two) times daily. 180 tablet 3  . Multiple Vitamin (MULTIVITAMIN WITH MINERALS) TABS tablet Take 1 tablet by mouth daily.    . prazosin (MINIPRESS) 1 MG capsule Take 1 capsule (1 mg total) by mouth 2 (two) times daily. 60 capsule 6  . spironolactone (ALDACTONE) 25 MG tablet Take 1 tablet (25 mg total) by mouth daily. 90 tablet 3  . torsemide (DEMADEX) 20 MG tablet Take 1 tablet (20 mg total) by mouth 2 (two) times daily. 60 tablet 1   No current facility-administered medications for this visit.    Allergies:   Review of patient's allergies indicates no known allergies.    Social History:  The patient  reports that he has never smoked. He has never used smokeless tobacco. He reports that he does not drink alcohol or use illicit drugs.   Family History:  The patient's    family history includes Asthma (age of onset: 72) in his father;  Diabetes in an other family member.    ROS:  Please see the history of present illness.   Otherwise, review of systems are positive for none.   All other systems are reviewed and negative.    PHYSICAL EXAM: VS:  BP 110/70 mmHg  Pulse 63  Ht 5\' 11"  (1.803 m)  SpO2 97% , BMI There is no weight on file to calculate BMI. GEN: Well nourished, well developed, in no acute distress Neck: no JVD, HJR, carotid bruits, or masses Cardiac: RRR; 2/6 systolic murmur at the left sternal border, no gallop, rubs, thrill or heave,  Respiratory:  clear to auscultation bilaterally, normal work of breathing GI: soft, nontender, nondistended, + BS MS: no deformity or atrophy Extremities: Right below the knee amputation left with trace of edema, decreased distal pulses  Skin: warm and dry, no rash Neuro:  Strength and sensation are intact    EKG:  EKG is not ordered today.    Recent Labs: 10/15/2014: B Natriuretic Peptide 751.7* 05/21/2015: Magnesium 1.8 05/24/2015: ALT 19 06/07/2015: BUN 19; Creatinine, Ser 0.91; Hemoglobin 9.1*; Platelets 225; Potassium 3.8; Sodium 142    Lipid Panel No results found for: CHOL, TRIG, HDL, CHOLHDL, VLDL, LDLCALC, LDLDIRECT    Wt Readings from Last 3 Encounters:  06/27/15 260 lb (117.935 kg)  05/29/15 257 lb 8 oz (116.801 kg)  05/21/15 250 lb 8 oz (113.626 kg)      Other studies Reviewed: Additional studies/ records that were reviewed today include and review of the records demonstrates:  2-D echo 04/24/14 Study Conclusions  - Left ventricle: Limited study. Definity contrast employed. The   estimated ejection fraction was approximately 35%. Diffuse   hypokinesis, most severe in the apex and lateral wall. No obvious   LV mural thrombus.     ASSESSMENT AND PLAN: Chronic systolic dysfunction of left ventricle Patient had increase edema treated with doubling up of his Demadex. The edema has resolved. He has not been weighed since his amputation. Lungs are  clear. We'll decrease Demadex to 20 mg twice a day. Try to obtain blood work from [place] done 2 days ago. Follow-up with Dr.Koneswaran in 4-6 weeks. Call for worsening edema or shortness of breath.  Essential hypertension Blood pressure is stable  Atrial flutter ([place]) Patient doesn't think he's been in atrial fibrillation for a while. Scheduled for device follow-up 07/16/15     Signed, Ermalinda Barrios, PA-C  07/03/2015 2:08 PM    [address], [place],   [address] Phone: [phone]; Fax: [phone]

## 2015-07-03 NOTE — Patient Instructions (Addendum)
Your physician recommends that you schedule a follow-up appointment in: 4-6 weeks with Dr. Bronson Ing.  Your physician has recommended you make the following change in your medication:    Decrease Demadex to 20 mg Two Times Daily  If you need a refill on your cardiac medications before your next appointment, please call your pharmacy.  Thank you for choosing [facility]!

## 2015-07-03 NOTE — Assessment & Plan Note (Signed)
Patient doesn't think he's been in atrial fibrillation for a while. Scheduled for device follow-up 07/16/15

## 2015-07-03 NOTE — Assessment & Plan Note (Signed)
Blood pressure is stable 

## 2015-07-03 NOTE — Assessment & Plan Note (Signed)
Patient had increase edema treated with doubling up of his Demadex. The edema has resolved. He has not been weighed since his amputation. Lungs are clear. We'll decrease Demadex to 20 mg twice a day. Try to obtain blood work from [place] done 2 days ago. Follow-up with Dr.Koneswaran in 4-6 weeks. Call for worsening edema or shortness of breath.

## 2015-07-04 ENCOUNTER — Telehealth: Payer: Self-pay | Admitting: *Deleted

## 2015-07-04 DIAGNOSIS — I10 Essential (primary) hypertension: Secondary | ICD-10-CM

## 2015-07-04 DIAGNOSIS — R799 Abnormal finding of blood chemistry, unspecified: Secondary | ICD-10-CM

## 2015-07-04 NOTE — Telephone Encounter (Signed)
-----   Message from Herminio Commons, MD sent at 07/03/2015  1:50 PM EST ----- Stable. Mild increase in BUN. Would repeat in 1 month.

## 2015-07-04 NOTE — Telephone Encounter (Signed)
Notes Recorded by Laurine Blazer, LPN on 624THL at 9:31 AM No answer.

## 2015-07-05 ENCOUNTER — Encounter: Payer: Self-pay | Admitting: *Deleted

## 2015-07-05 NOTE — Telephone Encounter (Signed)
Notes Recorded by Laurine Blazer, LPN on 624THL at 9:47 AM Patient notified of results by letter. Lab order included.  Copy fwd to pmd .

## 2015-07-09 ENCOUNTER — Ambulatory Visit (HOSPITAL_COMMUNITY)
Admission: RE | Admit: 2015-07-09 | Discharge: 2015-07-09 | Disposition: A | Discharge status: Home / Self Care | Payer: Medicare Other | Source: Ambulatory Visit | Attending: Urology | Admitting: Urology

## 2015-07-09 DIAGNOSIS — N329 Bladder disorder, unspecified: Secondary | ICD-10-CM | POA: Insufficient documentation

## 2015-07-09 DIAGNOSIS — R161 Splenomegaly, not elsewhere classified: Secondary | ICD-10-CM | POA: Diagnosis not present

## 2015-07-09 DIAGNOSIS — K760 Fatty (change of) liver, not elsewhere classified: Secondary | ICD-10-CM | POA: Insufficient documentation

## 2015-07-09 DIAGNOSIS — N3289 Other specified disorders of bladder: Secondary | ICD-10-CM

## 2015-07-09 MED ORDER — IOHEXOL 300 MG/ML  SOLN
100.0000 mL | Freq: Once | INTRAMUSCULAR | Status: AC | PRN
  Administered 2015-07-09: 100 mL via INTRAVENOUS

## 2015-07-16 ENCOUNTER — Telehealth: Payer: Self-pay

## 2015-07-16 NOTE — Telephone Encounter (Signed)
ICM transmission received.  Attempted call to home number and no answer.  Attempted cell number and person answering phone stated he just received this cell number and it must have been assigned to someone else before him.  Cell number removed from demographics.

## 2015-07-17 ENCOUNTER — Ambulatory Visit: Payer: Medicare Other | Admitting: Nutrition

## 2015-07-17 NOTE — Telephone Encounter (Signed)
Attempted ICM call and no answer.  

## 2015-07-19 ENCOUNTER — Ambulatory Visit (INDEPENDENT_AMBULATORY_CARE_PROVIDER_SITE_OTHER): Payer: Medicare Other | Admitting: Urology

## 2015-07-19 ENCOUNTER — Inpatient Hospital Stay: Payer: Medicare Other | Admitting: Physical Medicine & Rehabilitation

## 2015-07-19 DIAGNOSIS — N481 Balanitis: Secondary | ICD-10-CM

## 2015-07-19 DIAGNOSIS — N3289 Other specified disorders of bladder: Secondary | ICD-10-CM

## 2015-07-24 NOTE — Telephone Encounter (Signed)
Unable to reach patient for 07/16/2015 ICM transmission review.  Next ICM transmission scheduled for 08/05/2015.  ICM transmission impedance continues to trend below baseline.  Patient letter sent with new transmission date and to call if experiencing HF symptoms.

## 2015-07-25 ENCOUNTER — Encounter: Payer: Medicare Other | Attending: Physical Medicine & Rehabilitation

## 2015-07-25 ENCOUNTER — Inpatient Hospital Stay: Payer: Medicare Other | Admitting: Physical Medicine & Rehabilitation

## 2015-07-25 ENCOUNTER — Other Ambulatory Visit: Payer: Self-pay

## 2015-07-25 MED ORDER — CLONIDINE HCL 0.2 MG PO TABS: 0.2000 mg | ORAL_TABLET | Freq: Two times a day (BID) | ORAL | Status: AC

## 2015-07-25 NOTE — Telephone Encounter (Signed)
Received fax from pharmacy. Medication has been sent in, he will need to obtain further refills from Cardiologist. Thanks [facility]

## 2015-07-31 ENCOUNTER — Encounter: Payer: Self-pay | Admitting: *Deleted

## 2015-08-05 ENCOUNTER — Ambulatory Visit: Payer: Medicare Other | Admitting: Cardiovascular Disease

## 2015-08-05 ENCOUNTER — Ambulatory Visit (INDEPENDENT_AMBULATORY_CARE_PROVIDER_SITE_OTHER): Payer: Medicare Other | Admitting: *Deleted

## 2015-08-05 DIAGNOSIS — R0989 Other specified symptoms and signs involving the circulatory and respiratory systems: Secondary | ICD-10-CM

## 2015-08-05 DIAGNOSIS — I429 Cardiomyopathy, unspecified: Secondary | ICD-10-CM | POA: Diagnosis not present

## 2015-08-05 NOTE — Progress Notes (Signed)
Remote ICD transmission.   

## 2015-08-06 ENCOUNTER — Telehealth: Payer: Self-pay

## 2015-08-06 LAB — CUP PACEART REMOTE DEVICE CHECK
Battery Remaining Longevity: 76 mo
Brady Statistic RA Percent Paced: 17.16 %
Brady Statistic RV Percent Paced: 96.98 %
Date Time Interrogation Session: 20161219083424
HIGH POWER IMPEDANCE MEASURED VALUE: 47 Ohm
Implantable Lead Implant Date: 20150313
Implantable Lead Location: 753859
Implantable Lead Model: 4598
Implantable Lead Model: 5076
Lead Channel Impedance Value: 361 Ohm
Lead Channel Impedance Value: 418 Ohm
Lead Channel Impedance Value: 513 Ohm
Lead Channel Impedance Value: 589 Ohm
Lead Channel Impedance Value: 646 Ohm
Lead Channel Impedance Value: 665 Ohm
Lead Channel Pacing Threshold Pulse Width: 0.4 ms
Lead Channel Sensing Intrinsic Amplitude: 8.75 mV
Lead Channel Sensing Intrinsic Amplitude: 8.75 mV
Lead Channel Setting Pacing Amplitude: 1.75 V
Lead Channel Setting Pacing Amplitude: 2.5 V
Lead Channel Setting Pacing Amplitude: 2.5 V
Lead Channel Setting Pacing Pulse Width: 0.4 ms
Lead Channel Setting Pacing Pulse Width: 0.4 ms
Lead Channel Setting Sensing Sensitivity: 0.3 mV
MDC IDC LEAD IMPLANT DT: 20150313
MDC IDC LEAD IMPLANT DT: 20150313
MDC IDC LEAD LOCATION: 753858
MDC IDC LEAD LOCATION: 753860
MDC IDC MSMT BATTERY VOLTAGE: 2.98 V
MDC IDC MSMT LEADCHNL LV IMPEDANCE VALUE: 342 Ohm
MDC IDC MSMT LEADCHNL LV IMPEDANCE VALUE: 361 Ohm
MDC IDC MSMT LEADCHNL LV IMPEDANCE VALUE: 399 Ohm
MDC IDC MSMT LEADCHNL LV IMPEDANCE VALUE: 608 Ohm
MDC IDC MSMT LEADCHNL LV IMPEDANCE VALUE: 703 Ohm
MDC IDC MSMT LEADCHNL LV PACING THRESHOLD AMPLITUDE: 0.625 V
MDC IDC MSMT LEADCHNL LV PACING THRESHOLD PULSEWIDTH: 0.4 ms
MDC IDC MSMT LEADCHNL RA IMPEDANCE VALUE: 399 Ohm
MDC IDC MSMT LEADCHNL RA PACING THRESHOLD AMPLITUDE: 1.125 V
MDC IDC MSMT LEADCHNL RA SENSING INTR AMPL: 1.875 mV
MDC IDC MSMT LEADCHNL RA SENSING INTR AMPL: 1.875 mV
MDC IDC MSMT LEADCHNL RV IMPEDANCE VALUE: 247 Ohm
MDC IDC MSMT LEADCHNL RV PACING THRESHOLD AMPLITUDE: 1 V
MDC IDC MSMT LEADCHNL RV PACING THRESHOLD PULSEWIDTH: 0.4 ms
MDC IDC STAT BRADY AP VP PERCENT: 17.11 %
MDC IDC STAT BRADY AP VS PERCENT: 0.05 %
MDC IDC STAT BRADY AS VP PERCENT: 81.46 %
MDC IDC STAT BRADY AS VS PERCENT: 1.38 %

## 2015-08-06 NOTE — Telephone Encounter (Signed)
ICM transmission received.  Attempted call to patient and no answer or answering machine.

## 2015-08-07 NOTE — Telephone Encounter (Signed)
Attempted ICM call and no answer, no answering machine.  Next remote scheduled for 08/21/2015.  Optivol impedance below baseline since September suggesting significant amount of fluid retention.  Patient letter sent with new transmission date and to advise to call office or emergency number if experiencing any HF symptoms.   FYI for [name] and [name].

## 2015-08-08 ENCOUNTER — Encounter: Payer: Self-pay | Admitting: Cardiology

## 2015-08-21 ENCOUNTER — Telehealth: Payer: Self-pay

## 2015-08-21 NOTE — Telephone Encounter (Signed)
ICM transmission received.  Attempted ICM call and no answer.

## 2015-08-22 ENCOUNTER — Telehealth: Payer: Self-pay | Admitting: Nutrition

## 2015-08-22 NOTE — Telephone Encounter (Signed)
VM left to cal and reschedule missed appt. PC

## 2015-08-27 NOTE — Telephone Encounter (Signed)
Unable to reach patient for monthly remote ICM follow up after scheduled transmission on 08/21/2015.  Optivol impedance below baseline since September 2016 suggesting fluid retention.  Patient missed appointment with [name] in 08/05/2015 and has another appointment scheduled for 09/04/2015.   Next remote ICM transmission scheduled for 09/11/2015 and letter sent with new transmission date.   FYI for [name] and [name]  ICM Trend: 08/21/2015

## 2015-09-04 ENCOUNTER — Ambulatory Visit: Payer: Medicare Other | Admitting: Cardiovascular Disease

## 2015-09-04 ENCOUNTER — Ambulatory Visit (INDEPENDENT_AMBULATORY_CARE_PROVIDER_SITE_OTHER): Payer: Medicare Other | Admitting: Cardiovascular Disease

## 2015-09-04 ENCOUNTER — Encounter: Payer: Self-pay | Admitting: Cardiovascular Disease

## 2015-09-04 VITALS — BP 118/98 | HR 64 | Ht 71.0 in | Wt 260.0 lb

## 2015-09-04 DIAGNOSIS — I5023 Acute on chronic systolic (congestive) heart failure: Secondary | ICD-10-CM | POA: Diagnosis not present

## 2015-09-04 DIAGNOSIS — Z95810 Presence of automatic (implantable) cardiac defibrillator: Secondary | ICD-10-CM

## 2015-09-04 DIAGNOSIS — I429 Cardiomyopathy, unspecified: Secondary | ICD-10-CM

## 2015-09-04 DIAGNOSIS — I10 Essential (primary) hypertension: Secondary | ICD-10-CM | POA: Diagnosis not present

## 2015-09-04 DIAGNOSIS — I482 Chronic atrial fibrillation, unspecified: Secondary | ICD-10-CM

## 2015-09-04 DIAGNOSIS — I428 Other cardiomyopathies: Secondary | ICD-10-CM

## 2015-09-04 MED ORDER — TORSEMIDE 20 MG PO TABS: 40.0000 mg | ORAL_TABLET | Freq: Two times a day (BID) | ORAL | Status: DC

## 2015-09-04 MED ORDER — METOLAZONE 5 MG PO TABS: ORAL_TABLET | ORAL | Status: DC

## 2015-09-04 NOTE — Progress Notes (Signed)
Patient ID: [name], male   DOB: [birth year], 55 y.o.   MRN: [id number]      SUBJECTIVE: The patient returns for follow up for chronic systolic heart failure and has a CRT-D system in place. Echocardiogram performed on 04/24/14 demonstrated moderate to severely reduced left ventricular systolic function, EF [id number], grade 3 diastolic dysfunction and aortic sclerosis but no significant stenosis. He underwent right below the knee application on [id number] for peripheral vascular disease.  Optivol impedance has suggested fluid retention.  Wt 260 lbs (260 on 06/27/15).  His left leg is markedly swollen and he feels like he is "smothering" when he tries to lie flat. Denies chest pain.    Review of Systems: As per "subjective", otherwise negative.  No Known Allergies  Current Outpatient Prescriptions  Medication Sig Dispense Refill  . allopurinol (ZYLOPRIM) 300 MG tablet Take 1 tablet (300 mg total) by mouth daily. 30 tablet 5  . atorvastatin (LIPITOR) 10 MG tablet Take 1 tablet (10 mg total) by mouth every morning. 30 tablet 1  . cloNIDine (CATAPRES) 0.2 MG tablet Take 1 tablet (0.2 mg total) by mouth 2 (two) times daily. 60 tablet 1  . COLCRYS 0.6 MG tablet Take 0.6 mg by mouth daily as needed (gout).     . dabigatran (PRADAXA) 150 MG CAPS capsule Take 1 capsule (150 mg total) by mouth 2 (two) times daily. 60 capsule 6  . digoxin (LANOXIN) 0.125 MG tablet Take 1 tablet (0.125 mg total) by mouth daily. 30 tablet 1  . gemfibrozil (LOPID) 600 MG tablet Take 1 tablet (600 mg total) by mouth 2 (two) times daily before a meal. 60 tablet 1  . HYDROcodone-acetaminophen (NORCO/VICODIN) 5-325 MG tablet Take 1-2 tablets by mouth every 4 (four) hours as needed for moderate pain. 90 tablet 0  . insulin detemir (LEVEMIR) 100 UNIT/ML injection Inject 0.5 mLs (50 Units total) into the skin at bedtime. 10 mL 11  . methocarbamol (ROBAXIN) 500 MG tablet Take 1 tablet (500 mg total) by mouth every 6 (six) hours  as needed for muscle spasms. 60 tablet 0  . metoprolol (LOPRESSOR) 50 MG tablet Take 1.5 tablets (75 mg total) by mouth 2 (two) times daily. 180 tablet 3  . Multiple Vitamin (MULTIVITAMIN WITH MINERALS) TABS tablet Take 1 tablet by mouth daily.    . prazosin (MINIPRESS) 1 MG capsule Take 1 capsule (1 mg total) by mouth 2 (two) times daily. 60 capsule 6  . spironolactone (ALDACTONE) 25 MG tablet Take 1 tablet (25 mg total) by mouth daily. 90 tablet 3  . torsemide (DEMADEX) 20 MG tablet Take 1 tablet (20 mg total) by mouth 2 (two) times daily. 60 tablet 3   No current facility-administered medications for this visit.    Past Medical History  Diagnosis Date  . Hypertension   . Morbid obesity ([place])   . Atrial flutter ([place])  10/01/2006, 12/30/11    Status post RF ablation, 7/13, Dr. [name]  . OSA (obstructive sleep apnea)     compliant with CPAP  . Nonischemic cardiomyopathy ([place])     Normal coronary arteries, 2008  . Chronic systolic dysfunction of left ventricle     EF 20-25%, global HK; mild RVD, 2-D echo, 5/13  . Unspecified disorder of kidney and ureter   . RBBB, anterior fascicular block and incomplete LBBB   . Pulmonary hypertension ([facility])     RVSP 50-55 mmHg, 5/13  . Valvular heart disease     Mild MR/TR, 2-D echo,  5/13  . Type II or unspecified type diabetes mellitus without mention of complication, not stated as uncontrolled     Past Surgical History  Procedure Laterality Date  . Bi-ventricular implantable cardioverter defibrillator  (crt-d)  10/27/2013    MDT [name] XT CRTD implanted by [name] for NICM, CHF  . Atrial flutter ablation N/A 03/04/2012    Procedure: ATRIAL FLUTTER ABLATION;  Surgeon: Thompson Grayer, MD;  Location: [facility] CATH LAB;  Service: Cardiovascular;  Laterality: N/A;  . Bi-ventricular implantable cardioverter defibrillator N/A 10/27/2013    Procedure: BI-VENTRICULAR IMPLANTABLE CARDIOVERTER DEFIBRILLATOR  (CRT-D);  Surgeon: Coralyn Mark, MD;  Location: [facility]  CATH LAB;  Service: Cardiovascular;  Laterality: N/A;  . Cystoscopy/retrograde/ureteroscopy Bilateral 10/16/2014    Procedure: Kathrene Alu WITH BILATERAL Freddie Breech WITH GYRUS;  Surgeon: Malka So, MD;  Location: [facility] ORS;  Service: Urology;  Laterality: Bilateral;  . Transurethral resection of bladder tumor with gyrus (turbt-gyrus) N/A 10/16/2014    Procedure: TRANSURETHRAL RESECTION OF BLADDER TUMOR WITH GYRUS (TURBT-GYRUS);  Surgeon: Malka So, MD;  Location: [facility] ORS;  Service: Urology;  Laterality: N/A;  . Cataract extraction w/phaco Right 03/25/2015    Procedure: CATARACT EXTRACTION PHACO AND INTRAOCULAR LENS PLACEMENT RIGHT EYE CDE=12.24;  Surgeon: Tonny Branch, MD;  Location: [facility] ORS;  Service: Ophthalmology;  Laterality: Right;  . Amputation Right 05/21/2015    Procedure: AMPUTATION BELOW KNEE;  Surgeon: Newt Minion, MD;  Location: [place];  Service: Orthopedics;  Laterality: Right;    Social History   Social History  . Marital Status: Married    Spouse Name: N/A  . Number of Children: N/A  . Years of Education: N/A   Occupational History  . DISABLED    Social History Main Topics  . Smoking status: Never Smoker   . Smokeless tobacco: Never Used  . Alcohol Use: No  . Drug Use: No  . Sexual Activity: No   Other Topics Concern  . Not on file   Social History Narrative   Pt lives in [place] alone.  Disabled.  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     Filed Vitals:   09/04/15 1305  BP: 118/98  Pulse: 64  Height: 5\' 11"  (1.803 m)  Weight: 260 lb (117.935 kg)  SpO2: 94%    PHYSICAL EXAM General: NAD HEENT: Normal. Neck: +JVD, no thyromegaly. Lungs: Bibasilar rales. CV: Regular rate and rhythm, normal [id number] S2, 1/6 systolic murmur along left sternal border and over RUSB. Right leg BKA. Left leg with tense 2+ pitting edema and erythema. Abdomen: Soft, nontender, obese, mild distention.  Neurologic: Alert and oriented x 3.  Psych: Normal affect. Skin:  Stasis dermatitis of left leg. Musculoskeletal: Right BKA.  ECG: Most recent ECG reviewed.    ASSESSMENT AND PLAN: 1. Acute on chronic systolic heart failure/nonischemic cardiomyopathy s/p CRTD, EF 35%: Increase torsemide to 40 mg bid until appt next week. Will initiate metolazone 5 mg daily x 3 days. Check BMET this Friday. Have him f/u with NP early next week. If unable to accomplish adequate diuresis by next week, may have to hospitalize for IV diuresis. Pt informed of this possibility.  2. Essential HTN: Elevated DBP. Likely due to acute systolic CHF. Will diurese as noted in #1.  3. Aortic sclerosis: Echo results noted above with aortic valve sclerosis without stenosis.   4. Atrial fibrillation: HR controlled and CRT-D in place. Continue Pradaxa. No bleeding problems.  Dispo: f/u next week with NP.  Kate Sable, M.D., F.A.C.C.

## 2015-09-04 NOTE — Addendum Note (Signed)
Addended by: Laurine Blazer on: 09/04/2015 01:25 PM   Modules accepted: Orders

## 2015-09-04 NOTE — Patient Instructions (Addendum)
   Increase Torsemide to 40mg  twice a day.     Add Metolazone 5mg  daily x 5 days only, then stop.  New prescriptions sent to [facility] today.  Continue all other medications.   Lab for BMET - do this Friday, 09/06/2015.   Office will contact with results via phone or letter.   Follow up early next week with NP in our [place] office.

## 2015-09-10 ENCOUNTER — Ambulatory Visit: Payer: Medicare Other | Admitting: Adult Health

## 2015-09-11 ENCOUNTER — Ambulatory Visit (INDEPENDENT_AMBULATORY_CARE_PROVIDER_SITE_OTHER): Payer: Medicare Other

## 2015-09-11 DIAGNOSIS — I5023 Acute on chronic systolic (congestive) heart failure: Secondary | ICD-10-CM | POA: Diagnosis not present

## 2015-09-11 DIAGNOSIS — Z95810 Presence of automatic (implantable) cardiac defibrillator: Secondary | ICD-10-CM | POA: Diagnosis not present

## 2015-09-11 NOTE — Progress Notes (Signed)
EPIC Encounter for ICM Monitoring  Patient Name: [name] is a 55 y.o. male Date: 09/11/2015 Primary Care Physican: [name], MD Primary Cardiologist: Bronson Ing Electrophysiologist: Allred Dry Weight: Unknown-cannot weigh    Bi-V Pacing 97.3%      In the past month, have you:  1. Gained more than 2 pounds in a day or more than 5 pounds in a week? unknown  2. Had changes in your medications (with verification of current medications)?  Increase torsemide to 40 mg bid until appt on 09/13/2015 and metolazone 5 mg daily x 3 days starting on 09/04/2015.   3. Had more shortness of breath than is usual for you? Yes, has greatly improved since taking Metolazone and increase in Torsemide on 09/04/2015  4. Limited your activity because of shortness of breath? no  5. Not been able to sleep because of shortness of breath? Yes, but is improving  6. Had increased swelling in your feet or ankles? Yes, leg swelling and thigh swelling improved since 09/04/2015.  Patient has new prosthesis  7. Had symptoms of dehydration (dizziness, dry mouth, increased thirst, decreased urine output) no  8. Had changes in sodium restriction? no  9. Been compliant with medication? Yes   ICM trend: 3 month view for 09/11/2015   ICM trend: 1 year view for 09/11/2015   Follow-up plan: ICM clinic phone appointment on 09/26/2015.  Carelink thoracic impedance below reference line since September 2016 suggesting fluid retention.  Thoracic impedance line improving,  trending toward reference line, which correlates with Torsemide increase and Metolazone addition for 3 days on 09/04/2015.  He reported feeling much better but knows he still has some fluid since his leg and thigh (above stump) is swollen and some shortness of breath.  He confirmed he has appointment with Bunnie Domino NP on 09/13/2015.   Repeat transmission on 09/26/2015.  Advised will send for [name] and [name] for review which shows the  effectiveness of Metolazone and increase of Torsemide since 09/04/2015.   Note routed to Jory Sims, NP for update prior to patients appointment on 09/13/2015.   Copy of note sent to patient's primary care physician, primary cardiologist, and device following physician.  Rosalene Billings, RN, CCM 09/11/2015 3:03 PM

## 2015-09-13 ENCOUNTER — Encounter: Payer: Self-pay | Admitting: Adult Health

## 2015-09-13 ENCOUNTER — Encounter: Payer: Medicare Other | Admitting: Adult Health

## 2015-09-13 NOTE — Progress Notes (Signed)
Cardiology Office Note   Date:  09/13/2015   ID:  SEVAG DIEBEL, DOB 1961/05/03, MRN [id number]  PCP:  [name], MD  Cardiologist: Woodroe Chen, NP   No chief complaint on file.   ERROR NO SHOW

## 2015-09-26 ENCOUNTER — Telehealth: Payer: Self-pay

## 2015-09-26 ENCOUNTER — Encounter: Payer: Medicare Other | Admitting: *Deleted

## 2015-09-26 NOTE — Telephone Encounter (Signed)
Remote ICM transmission received.  Attempted patient call and left message for return call.   

## 2015-10-03 NOTE — Telephone Encounter (Signed)
Unable to reach patient for remote monthly ICM follow up.  Next remote ICM transmission scheduled for 10/17/2015 and patient letter sent with new date and to call if experiencing any symptoms.    09/26/2015 ICM transmission to recheck fluid levels.  Optivol thoracic impedance below reference line from 09/20/2015 to 09/26/2015 suggesting fluid accumulation.   Thoracic impedance trended to reference line on 09/19/2015 after he was given Metolazone and increased Torsemide on 09/04/2015.     FYI only for [name]  ICM trend for 09/26/2015

## 2015-10-04 ENCOUNTER — Inpatient Hospital Stay: Payer: Medicare Other | Admitting: Physical Medicine & Rehabilitation

## 2015-10-04 ENCOUNTER — Encounter: Payer: Medicare Other | Attending: Physical Medicine & Rehabilitation

## 2015-10-16 ENCOUNTER — Telehealth: Payer: Self-pay | Admitting: Cardiology

## 2015-10-16 ENCOUNTER — Ambulatory Visit (INDEPENDENT_AMBULATORY_CARE_PROVIDER_SITE_OTHER): Payer: Medicare Other

## 2015-10-16 DIAGNOSIS — Z95810 Presence of automatic (implantable) cardiac defibrillator: Secondary | ICD-10-CM

## 2015-10-16 DIAGNOSIS — I5023 Acute on chronic systolic (congestive) heart failure: Secondary | ICD-10-CM | POA: Diagnosis not present

## 2015-10-16 NOTE — Telephone Encounter (Signed)
LMOVM reminding pt to send remote transmission.   

## 2015-10-18 ENCOUNTER — Ambulatory Visit (INDEPENDENT_AMBULATORY_CARE_PROVIDER_SITE_OTHER): Payer: Medicare Other | Admitting: Urology

## 2015-10-18 DIAGNOSIS — R3121 Asymptomatic microscopic hematuria: Secondary | ICD-10-CM

## 2015-10-18 DIAGNOSIS — N3289 Other specified disorders of bladder: Secondary | ICD-10-CM | POA: Diagnosis not present

## 2015-10-25 NOTE — Progress Notes (Signed)
EPIC Encounter for ICM Monitoring  Patient Name: [name] is a 55 y.o. male Date: 10/25/2015 Primary Care Physican: [name], MD Primary Cardiologist: Bronson Ing Electrophysiologist: Allred Dry Weight: unknown   Bi-V Pacing 97.4%      In the past month, have you:  1. Gained more than 2 pounds in a day or more than 5 pounds in a week? N/A  2. Had changes in your medications (with verification of current medications)? N/A  3. Had more shortness of breath than is usual for you? N/A  4. Limited your activity because of shortness of breath? N/A  5. Not been able to sleep because of shortness of breath? N/A  6. Had increased swelling in your feet or ankles? N/A  7. Had symptoms of dehydration (dizziness, dry mouth, increased thirst, decreased urine output) N/A  8. Had changes in sodium restriction? N/A  9. Been compliant with medication? N/A   ICM trend: 3 month view for 10/17/2015   ICM trend: 1 year view for 11/04/2015   Follow-up plan: ICM clinic phone appointment on 11/04/2015.  Unable to reach patient and transmission reviewed.  Thoracic impedance below reference line in the last month except for one day on 09/19/2015 suggesting fluid accumulation.    Unable to make any changes today due to unable to reach patient for follow up.    Copy of note sent to patient's primary care physician, primary cardiologist, and device following physician.  Rosalene Billings, RN, CCM 10/25/2015 2:35 PM

## 2015-11-04 ENCOUNTER — Ambulatory Visit (INDEPENDENT_AMBULATORY_CARE_PROVIDER_SITE_OTHER): Payer: Medicare Other | Admitting: *Deleted

## 2015-11-04 DIAGNOSIS — Z95810 Presence of automatic (implantable) cardiac defibrillator: Secondary | ICD-10-CM | POA: Diagnosis not present

## 2015-11-04 DIAGNOSIS — I429 Cardiomyopathy, unspecified: Secondary | ICD-10-CM | POA: Diagnosis not present

## 2015-11-04 DIAGNOSIS — I5022 Chronic systolic (congestive) heart failure: Secondary | ICD-10-CM

## 2015-11-04 DIAGNOSIS — I428 Other cardiomyopathies: Secondary | ICD-10-CM

## 2015-11-04 NOTE — Progress Notes (Signed)
EPIC Encounter for ICM Monitoring  Patient Name: [name] is a 55 y.o. male Date: 11/04/2015 Primary Care Physican: [name], MD Primary Cardiologist: Bronson Ing Electrophysiologist: Allred Dry Weight: unknown   Bi-V Pacing 97.4%      In the past month, have you:  1. Gained more than 2 pounds in a day or more than 5 pounds in a week? N/A  2. Had changes in your medications (with verification of current medications)? N/A  3. Had more shortness of breath than is usual for you? N/A  4. Limited your activity because of shortness of breath? N/A  5. Not been able to sleep because of shortness of breath? N/A  6. Had increased swelling in your feet or ankles? N/A  7. Had symptoms of dehydration (dizziness, dry mouth, increased thirst, decreased urine output) N/A  8. Had changes in sodium restriction? N/A  9. Been compliant with medication? N/A   ICM trend: 3 month view for 11/04/2015   ICM trend: 1 year view for 11/04/2015   Follow-up plan: ICM clinic phone appointment on  11/21/2015.  Attempted call to patient and unable to reach.  Transmission reviewed.  Thoracic impedance below reference line from 10/21/2015 to 11/04/2015 suggesting fluid accumulation.    Unable to reach patient and no changes made.  Copy of note sent to patient's primary care physician, primary cardiologist, and device following physician.  Rosalene Billings, RN, CCM 11/04/2015 11:16 AM

## 2015-11-05 NOTE — Progress Notes (Signed)
Remote ICD transmission.   

## 2015-11-21 ENCOUNTER — Telehealth: Payer: Self-pay

## 2015-11-21 ENCOUNTER — Ambulatory Visit (INDEPENDENT_AMBULATORY_CARE_PROVIDER_SITE_OTHER): Payer: Medicare Other

## 2015-11-21 DIAGNOSIS — I5022 Chronic systolic (congestive) heart failure: Secondary | ICD-10-CM

## 2015-11-21 DIAGNOSIS — Z95810 Presence of automatic (implantable) cardiac defibrillator: Secondary | ICD-10-CM | POA: Diagnosis not present

## 2015-11-21 NOTE — Telephone Encounter (Signed)
Remote ICM transmission received.  Attempted patient call and left message for return call.   

## 2015-11-21 NOTE — Progress Notes (Signed)
EPIC Encounter for ICM Monitoring  Patient Name: [name] is a 55 y.o. male Date: 11/21/2015 Primary Care Physican: [name], MD Primary Cardiologist: Bronson Ing Electrophysiologist: Allred Dry Weight: 248lbs at last weight with prosthesis   Bi-V Pacing 94.7%      In the past month, have you:  1. Gained more than 2 pounds in a day or more than 5 pounds in a week? no  2. Had changes in your medications (with verification of current medications)? no  3. Had more shortness of breath than is usual for you? no  4. Limited your activity because of shortness of breath? no  5. Not been able to sleep because of shortness of breath? no  6. Had increased swelling in your feet or ankles? Continues to have swelling, has not completely resolved  7. Had symptoms of dehydration (dizziness, dry mouth, increased thirst, decreased urine output) no  8. Had changes in sodium restriction? no  9. Been compliant with medication? Yes   ICM trend: 3 month view for 11/21/2015   ICM trend: 1 year view for 11/21/2015   Follow-up plan: ICM clinic phone appointment on 11/27/2015.  Thoracic impedance below reference line starting 10/20/2015 suggesting fluid accumulation.  Patient has symptoms of lower extremity swelling (amputee one leg).  He stated the swelling in upper legs and one lower leg has improved but still there.  Denied SOB and difficult to determine if he has gained weight due to weighing with the prosthesis.  Advised to weigh with prosthesis daily with same amount of clothes to get more accurate weight.  Education given to limit sodium intake to < 2000 mg and fluid intake to 64 oz daily.  Encouraged to call for any fluid symptoms.  No changes today.    09/17/2015 Creatinine 0.92, BUN 25, Potassium 4.7        Advised to increase Torsemide to 60 mg bid x 3 days and after 3rd day return to normal prescribed dosage of 40 mg bid.   Advised will send for [name] and [name] for review and  any recommendations.  No recall in for appointment with [name] and advise would ask when should be next follow up appointment.  Last visit was 09/04/2015.   Copy of note sent to patient's primary care physician, primary cardiologist, and device following physician.  Rosalene Billings, RN, CCM 11/21/2015 2:34 PM

## 2015-11-22 NOTE — Progress Notes (Signed)
Carl Commons, MD   Sent: Fri November 22, 2015 9:12 AM    To: Rosalene Billings, RN        Message     He was supposed to have followed up with an NP the week after he saw me on 09/04/15. I would schedule an appt with Gerrianne Scale on a Wednesday within next 2-3 weeks.        Attempted call to patient regarding Dr Court Joy recommendations and left voice mail message for return call.

## 2015-11-25 NOTE — Progress Notes (Signed)
Call to patient and per DPR left message regarding Dr Raylene Everts recommendations.  Advised he should call office make an appointment with Gerrianne Scale, NP within the next 2-3 months.  Will follow up with him after next ICM remote transmission on 11/27/2015 and to call for any questions.

## 2015-11-27 ENCOUNTER — Ambulatory Visit (INDEPENDENT_AMBULATORY_CARE_PROVIDER_SITE_OTHER): Payer: Medicare Other

## 2015-11-27 ENCOUNTER — Telehealth: Payer: Self-pay

## 2015-11-27 DIAGNOSIS — I5022 Chronic systolic (congestive) heart failure: Secondary | ICD-10-CM

## 2015-11-27 DIAGNOSIS — Z95810 Presence of automatic (implantable) cardiac defibrillator: Secondary | ICD-10-CM

## 2015-11-27 NOTE — Telephone Encounter (Signed)
Remote ICM transmission received.  Attempted patient call and left message for return call.   

## 2015-11-27 NOTE — Progress Notes (Signed)
EPIC Encounter for ICM Monitoring  Patient Name: [name] is a 55 y.o. male Date: 11/27/2015 Primary Care Physican: [name], MD Primary Cardiologist: Bronson Ing Electrophysiologist: Allred Dry Weight: unknown  Bi-V Pacing 97.1%      In the past month, have you:  1. Gained more than 2 pounds in a day or more than 5 pounds in a week? no  2. Had changes in your medications (with verification of current medications)? no  3. Had more shortness of breath than is usual for you? no  4. Limited your activity because of shortness of breath? no  5. Not been able to sleep because of shortness of breath? no  6. Had increased swelling in your feet or ankles? Wears compression stockings and still has some fluid in tops of legs  7. Had symptoms of dehydration (dizziness, dry mouth, increased thirst, decreased urine output) no  8. Had changes in sodium restriction? no  9. Been compliant with medication? Yes   ICM trend: 3 month view for 11/27/2015   ICM trend: 1 year view for 11/27/2015   Follow-up plan: ICM clinic phone appointment on 12/04/2015.  Since last ICM remote transmission on 11/21/2015, thoracic impedance continues below reference line and no improvement after increase in Torsemide to 60 mg bid x 3 days from 11/21/2015 to 11/24/2015 and suggesting fluid accumulation.  Advised to call asap to make an office appointment with Gerrianne Scale, PA.  Patient has swelling at tops of legs and is wearing compression stockings.  He stated he feels good and is back to driving since he has the prosthesis.        Will send to [name] and [name] for review and for any further recommendations since 3 days of increased Torsemide did not make improvement on thoracic impedance.  Confirmed he is back to prescribed dosage of Torsemide 20 mg - takes two tablets (40mg ) twice a day.             Try adding metolazone 2.5 mg daily x 3 days.         ----- Message -----     From: Rosalene Billings, RN     Sent: 11/27/2015 11:31 AM      To: Herminio Commons, MD    Attempted call to patient and requested return call regarding new orders.   Call to patient and provided Dr Court Joy orders to take Metolazone 2.5 mg x 3 days only and take 30 minutes prior to morning Torsemide dosage.  He stated he has Metolazone 5 mg tablets left from previous prescription and advised to split the tablet and to take 1/2 tablet 2.5 mg x 3 days.  He should stop Metolazone after 3rd day and continue with Torsemide normally prescribed dosage.  He verbalized understanding.     Rosalene Billings, RN, CCM 11/27/2015 10:48 AM

## 2015-11-29 ENCOUNTER — Other Ambulatory Visit: Payer: Self-pay | Admitting: Cardiovascular Disease

## 2015-12-04 ENCOUNTER — Telehealth: Payer: Self-pay | Admitting: Cardiology

## 2015-12-04 ENCOUNTER — Encounter: Payer: Self-pay | Admitting: Cardiology

## 2015-12-04 ENCOUNTER — Ambulatory Visit (INDEPENDENT_AMBULATORY_CARE_PROVIDER_SITE_OTHER): Payer: Medicare Other

## 2015-12-04 DIAGNOSIS — Z95810 Presence of automatic (implantable) cardiac defibrillator: Secondary | ICD-10-CM

## 2015-12-04 DIAGNOSIS — I5022 Chronic systolic (congestive) heart failure: Secondary | ICD-10-CM

## 2015-12-04 LAB — CUP PACEART REMOTE DEVICE CHECK
Battery Remaining Longevity: 71 mo
Battery Voltage: 2.98 V
Brady Statistic AP VS Percent: 0.09 %
Brady Statistic AS VP Percent: 61.2 %
Brady Statistic AS VS Percent: 0.72 %
Brady Statistic RA Percent Paced: 38.08 %
HIGH POWER IMPEDANCE MEASURED VALUE: 55 Ohm
Implantable Lead Implant Date: 20150313
Implantable Lead Implant Date: 20150313
Implantable Lead Location: 753858
Implantable Lead Location: 753860
Implantable Lead Model: 4598
Implantable Lead Model: 6935
Lead Channel Impedance Value: 285 Ohm
Lead Channel Impedance Value: 361 Ohm
Lead Channel Impedance Value: 361 Ohm
Lead Channel Impedance Value: 513 Ohm
Lead Channel Impedance Value: 665 Ohm
Lead Channel Impedance Value: 665 Ohm
Lead Channel Impedance Value: 722 Ohm
Lead Channel Pacing Threshold Amplitude: 0.625 V
Lead Channel Pacing Threshold Amplitude: 1 V
Lead Channel Pacing Threshold Pulse Width: 0.4 ms
Lead Channel Pacing Threshold Pulse Width: 0.4 ms
Lead Channel Sensing Intrinsic Amplitude: 2 mV
Lead Channel Setting Pacing Amplitude: 2.5 V
Lead Channel Setting Pacing Amplitude: 2.5 V
Lead Channel Setting Pacing Pulse Width: 0.4 ms
Lead Channel Setting Sensing Sensitivity: 0.3 mV
MDC IDC LEAD IMPLANT DT: 20150313
MDC IDC LEAD LOCATION: 753859
MDC IDC MSMT LEADCHNL LV IMPEDANCE VALUE: 399 Ohm
MDC IDC MSMT LEADCHNL LV IMPEDANCE VALUE: 399 Ohm
MDC IDC MSMT LEADCHNL LV IMPEDANCE VALUE: 456 Ohm
MDC IDC MSMT LEADCHNL LV IMPEDANCE VALUE: 646 Ohm
MDC IDC MSMT LEADCHNL LV IMPEDANCE VALUE: 703 Ohm
MDC IDC MSMT LEADCHNL RA IMPEDANCE VALUE: 418 Ohm
MDC IDC MSMT LEADCHNL RA SENSING INTR AMPL: 2 mV
MDC IDC MSMT LEADCHNL RV PACING THRESHOLD AMPLITUDE: 1.125 V
MDC IDC MSMT LEADCHNL RV PACING THRESHOLD PULSEWIDTH: 0.4 ms
MDC IDC MSMT LEADCHNL RV SENSING INTR AMPL: 3.625 mV
MDC IDC MSMT LEADCHNL RV SENSING INTR AMPL: 3.625 mV
MDC IDC SESS DTM: 20170320071606
MDC IDC SET LEADCHNL LV PACING AMPLITUDE: 1.75 V
MDC IDC SET LEADCHNL LV PACING PULSEWIDTH: 0.4 ms
MDC IDC STAT BRADY AP VP PERCENT: 37.99 %
MDC IDC STAT BRADY RV PERCENT PACED: 97.9 %

## 2015-12-04 NOTE — Progress Notes (Signed)
EPIC Encounter for ICM Monitoring  Patient Name: [name] is a 55 y.o. male Date: 12/04/2015 Primary Care Physican: [name], MD Primary Cardiologist: Bronson Ing Electrophysiologist: Allred Dry Weight: unknown   Bi-V Pacing 97%      In the past month, have you:  1. Gained more than 2 pounds in a day or more than 5 pounds in a week? no  2. Had changes in your medications (with verification of current medications)? no  3. Had more shortness of breath than is usual for you? no  4. Limited your activity because of shortness of breath? no  5. Not been able to sleep because of shortness of breath? no  6. Had increased swelling in your feet or ankles? no  7. Had symptoms of dehydration (dizziness, dry mouth, increased thirst, decreased urine output) no  8. Had changes in sodium restriction? no  9. Been compliant with medication? Yes   ICM trend: 3 month view for 12/04/2015   ICM trend: 1 year view for 12/04/2015   Follow-up plan: ICM clinic phone appointment on 12/19/2015.  Since last ICM transmission on 11/27/2015, thoracic impedance returned to reference line after taking 3 days of Metolazone (started 11/27/2015 x 3 days).  Thoracic impedance did return back to below baseline 11/29/2015 to 12/02/2015 and back to reference line again on 12/02/2015.  Improvement in thoracic impedance after Metolazone.  He still has some swelling in upper legs (wears prosthesis on one leg) but denied other fluid symptoms.  Again reminded him to make an appointment with Estella Husk PA as recommended by [name] and he stated he would do so.  Reminded him to limit sodium intake to < 2000 mg and fluid intake to 64 oz daily.  Encouraged to call for any fluid symptoms.  No changes today.    Advised would send to [name] and [name] for review regarding effectiveness of Metolazone and will call back if any recommendations.  Repeat transmission on 12/19/2015 and has office appointment with Dr  [name] 01/22/2016.  Copy of note sent to patient's primary care physician, primary cardiologist, and device following physician.  Rosalene Billings, RN, CCM 12/04/2015 2:48 PM

## 2015-12-04 NOTE — Telephone Encounter (Signed)
LMOVM reminding pt to send remote transmission.   

## 2015-12-19 ENCOUNTER — Ambulatory Visit (INDEPENDENT_AMBULATORY_CARE_PROVIDER_SITE_OTHER): Payer: Medicare Other

## 2015-12-19 ENCOUNTER — Telehealth: Payer: Self-pay

## 2015-12-19 DIAGNOSIS — Z95810 Presence of automatic (implantable) cardiac defibrillator: Secondary | ICD-10-CM

## 2015-12-19 DIAGNOSIS — I5022 Chronic systolic (congestive) heart failure: Secondary | ICD-10-CM

## 2015-12-19 NOTE — Telephone Encounter (Signed)
Remote ICM transmission received.  Attempted patient call and left message for return call.   

## 2015-12-19 NOTE — Progress Notes (Signed)
EPIC Encounter for ICM Monitoring  Patient Name: [name] is a 55 y.o. male Date: 12/19/2015 Primary Care Physican: [name], MD Primary Cardiologist: Bronson Ing Electrophysiologist: Allred Dry Weight: unknown   Bi-V Pacing 98.7%      In the past month, have you:  1. Gained more than 2 pounds in a day or more than 5 pounds in a week? N/A  2. Had changes in your medications (with verification of current medications)? N/A  3. Had more shortness of breath than is usual for you? N/A  4. Limited your activity because of shortness of breath? N/A  5. Not been able to sleep because of shortness of breath? N/A  6. Had increased swelling in your feet or ankles? N/A  7. Had symptoms of dehydration (dizziness, dry mouth, increased thirst, decreased urine output) N/A  8. Had changes in sodium restriction? N/A  9. Been compliant with medication? N/A  ICM trend: 3 month view for 12/19/2015  ICM trend: 1 year view for 12/19/2015   Follow-up plan: ICM clinic phone appointment 02/26/2016 and office appointment with [name] on 01/22/2016.    Attempted patient call and no answer.  Transmission reviewed.   FLUID LEVELS: Since last ICM transmission on 12/04/2015, Optivol thoracic impedance returned back to baseline 12/05/2015 suggesting stable fluid levels.    Will send copy to [name] and Dr Dwana Curd for updated transmission showing impedance has returned back to baseline.   Unable to reach patient.    Rosalene Billings, RN, CCM 12/19/2015 11:36 AM

## 2015-12-20 NOTE — Telephone Encounter (Signed)
Attempted 2nd ICM call to patient and no answer.  No answering machine.

## 2016-01-17 ENCOUNTER — Ambulatory Visit: Payer: Medicare Other | Admitting: Internal Medicine

## 2016-01-22 ENCOUNTER — Encounter: Payer: Self-pay | Admitting: Internal Medicine

## 2016-01-22 ENCOUNTER — Ambulatory Visit (INDEPENDENT_AMBULATORY_CARE_PROVIDER_SITE_OTHER): Payer: Medicare Other | Admitting: Internal Medicine

## 2016-01-22 VITALS — BP 138/88 | HR 59 | Ht 71.0 in | Wt 241.6 lb

## 2016-01-22 DIAGNOSIS — Z95810 Presence of automatic (implantable) cardiac defibrillator: Secondary | ICD-10-CM | POA: Diagnosis not present

## 2016-01-22 DIAGNOSIS — I5022 Chronic systolic (congestive) heart failure: Secondary | ICD-10-CM | POA: Diagnosis not present

## 2016-01-22 DIAGNOSIS — I4891 Unspecified atrial fibrillation: Secondary | ICD-10-CM

## 2016-01-22 DIAGNOSIS — I429 Cardiomyopathy, unspecified: Secondary | ICD-10-CM | POA: Diagnosis not present

## 2016-01-22 DIAGNOSIS — I519 Heart disease, unspecified: Secondary | ICD-10-CM

## 2016-01-22 LAB — CUP PACEART INCLINIC DEVICE CHECK
Battery Voltage: 2.98 V
Brady Statistic AP VP Percent: 23.06 %
Brady Statistic AS VP Percent: 75.22 %
Brady Statistic RA Percent Paced: 23.17 %
Date Time Interrogation Session: 20170607100945
HIGH POWER IMPEDANCE MEASURED VALUE: 65 Ohm
Implantable Lead Implant Date: 20150313
Implantable Lead Location: 753858
Implantable Lead Location: 753859
Implantable Lead Location: 753860
Implantable Lead Model: 6935
Lead Channel Impedance Value: 285 Ohm
Lead Channel Impedance Value: 456 Ohm
Lead Channel Impedance Value: 456 Ohm
Lead Channel Impedance Value: 475 Ohm
Lead Channel Impedance Value: 513 Ohm
Lead Channel Impedance Value: 836 Ohm
Lead Channel Impedance Value: 874 Ohm
Lead Channel Pacing Threshold Amplitude: 1 V
Lead Channel Pacing Threshold Pulse Width: 0.4 ms
Lead Channel Sensing Intrinsic Amplitude: 3.125 mV
Lead Channel Sensing Intrinsic Amplitude: 8.375 mV
Lead Channel Setting Pacing Amplitude: 2.75 V
Lead Channel Setting Pacing Pulse Width: 0.4 ms
Lead Channel Setting Sensing Sensitivity: 0.3 mV
MDC IDC LEAD IMPLANT DT: 20150313
MDC IDC LEAD IMPLANT DT: 20150313
MDC IDC LEAD MODEL: 4598
MDC IDC MSMT BATTERY REMAINING LONGEVITY: 62 mo
MDC IDC MSMT LEADCHNL LV IMPEDANCE VALUE: 532 Ohm
MDC IDC MSMT LEADCHNL LV IMPEDANCE VALUE: 646 Ohm
MDC IDC MSMT LEADCHNL LV IMPEDANCE VALUE: 836 Ohm
MDC IDC MSMT LEADCHNL LV IMPEDANCE VALUE: 874 Ohm
MDC IDC MSMT LEADCHNL LV IMPEDANCE VALUE: 950 Ohm
MDC IDC MSMT LEADCHNL LV PACING THRESHOLD AMPLITUDE: 0.75 V
MDC IDC MSMT LEADCHNL LV PACING THRESHOLD PULSEWIDTH: 0.4 ms
MDC IDC MSMT LEADCHNL RA PACING THRESHOLD AMPLITUDE: 1 V
MDC IDC MSMT LEADCHNL RA PACING THRESHOLD PULSEWIDTH: 0.4 ms
MDC IDC MSMT LEADCHNL RA SENSING INTR AMPL: 3.375 mV
MDC IDC MSMT LEADCHNL RV IMPEDANCE VALUE: 399 Ohm
MDC IDC MSMT LEADCHNL RV SENSING INTR AMPL: 8.75 mV
MDC IDC SET LEADCHNL LV PACING AMPLITUDE: 1.75 V
MDC IDC SET LEADCHNL RA PACING AMPLITUDE: 2 V
MDC IDC SET LEADCHNL RV PACING PULSEWIDTH: 0.4 ms
MDC IDC STAT BRADY AP VS PERCENT: 0.1 %
MDC IDC STAT BRADY AS VS PERCENT: 1.61 %
MDC IDC STAT BRADY RV PERCENT PACED: 96.53 %

## 2016-01-22 NOTE — Progress Notes (Signed)
Electrophysiology Office Note   Date:  01/22/2016   ID:  [name], DOB July 13, 1961, MRN [id number]  PCP:  [name], MD  Cardiologist:  [name] Primary Electrophysiologist: Thompson Grayer, MD    Chief Complaint  Patient presents with  . Congestive Heart Failure     History of Present Illness: [name] is a 55 y.o. male who presents today for electrophysiology evaluation.   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  He is adjusting to prosthesis well.  He had difficulty with CHF in Dec and January but is now doing much better with addition of zaroxolyn by [name].  His Optivol tracing is much improved!  Today, he denies symptoms of palpitations, chest pain  orthopnea, PND, claudication, dizziness, presyncope, syncope, bleeding, or neurologic sequela. The patient is tolerating medications without difficulties and is otherwise without complaint today.    Past Medical History  Diagnosis Date  . Hypertension   . Morbid obesity ([place])   . Atrial flutter ([place])  10/01/2006, 12/30/11    Status post RF ablation, 7/13, Dr. [name]  . OSA (obstructive sleep apnea)     compliant with CPAP  . Nonischemic cardiomyopathy ([place])     Normal coronary arteries, 2008  . Chronic systolic dysfunction of left ventricle     EF 20-25%, global HK; mild RVD, 2-D echo, 5/13  . Unspecified disorder of kidney and ureter   . RBBB, anterior fascicular block and incomplete LBBB   . Pulmonary hypertension (HCC)     RVSP 50-55 mmHg, 5/13  . Valvular heart disease     Mild MR/TR, 2-D echo, 5/13  . Type II or unspecified type diabetes mellitus without mention of complication, not stated as uncontrolled    Past Surgical History  Procedure Laterality Date  . Bi-ventricular implantable cardioverter defibrillator  (crt-d)  10/27/2013    MDT [name] XT CRTD implanted by [name] for NICM, CHF  . Atrial flutter ablation N/A 03/04/2012    Procedure: ATRIAL FLUTTER ABLATION;  Surgeon: Thompson Grayer, MD;   Location: [facility] C[facility] LAB;  Service: Cardiovascular;  Laterality: N/A;  . Bi-ventricular implantable cardioverter defibrillator N/A 10/27/2013    Procedure: BI-VENTRICULAR IMPLANTABLE CARDIOVERTER DEFIBRILLATOR  (CRT-D);  Surgeon: Coralyn Mark, MD;  Location: [facility] CATH LAB;  Service: Cardiovascular;  Laterality: N/A;  . Cystoscopy/retrograde/ureteroscopy Bilateral 10/16/2014    Procedure: Kathrene Alu WITH BILATERAL Freddie Breech WITH GYRUS;  Surgeon: Malka So, MD;  Location: [facility];  Service: Urology;  Laterality: Bilateral;  . Transurethral resection of bladder tumor with gyrus (turbt-gyrus) N/A 10/16/2014    Procedure: TRANSURETHRAL RESECTION OF BLADDER TUMOR WITH GYRUS (TURBT-GYRUS);  Surgeon: Malka So, MD;  Location: [facility];  Service: Urology;  Laterality: N/A;  . Cataract extraction w/phaco Right 03/25/2015    Procedure: CATARACT EXTRACTION PHACO AND INTRAOCULAR LENS PLACEMENT RIGHT EYE CDE=12.24;  Surgeon: Tonny Branch, MD;  Location: [facility];  Service: Ophthalmology;  Laterality: Right;  . Amputation Right 05/21/2015    Procedure: AMPUTATION BELOW KNEE;  Surgeon: Newt Minion, MD;  Location: [facility];  Service: Orthopedics;  Laterality: Right;     Current Outpatient Prescriptions  Medication Sig Dispense Refill  . allopurinol (ZYLOPRIM) 300 MG tablet Take 1 tablet (300 mg total) by mouth daily. 30 tablet 5  . atorvastatin (LIPITOR) 10 MG tablet Take 1 tablet (10 mg total) by mouth every morning. 30 tablet 1  . cloNIDine (CATAPRES) 0.2 MG tablet Take 1 tablet (0.2 mg total) by mouth  2 (two) times daily. 60 tablet 1  . COLCRYS 0.6 MG tablet Take 0.6 mg by mouth daily as needed (gout).     . dabigatran (PRADAXA) 150 MG CAPS capsule Take 1 capsule (150 mg total) by mouth 2 (two) times daily. 60 capsule 6  . digoxin (LANOXIN) 0.125 MG tablet Take 1 tablet (0.125 mg total) by mouth daily. 30 tablet 1  . gemfibrozil (LOPID) 600 MG tablet Take 1 tablet (600 mg total) by mouth 2 (two) times daily before a  meal. 60 tablet 1  . HYDROcodone-acetaminophen (NORCO/VICODIN) 5-325 MG tablet Take 1-2 tablets by mouth every 4 (four) hours as needed for moderate pain. 90 tablet 0  . insulin detemir (LEVEMIR) 100 UNIT/ML injection Inject 0.5 mLs (50 Units total) into the skin at bedtime. 10 mL 11  . metFORMIN (GLUCOPHAGE) 1000 MG tablet Take 1,000 mg by mouth 2 (two) times daily.  1  . methocarbamol (ROBAXIN) 500 MG tablet Take 1 tablet (500 mg total) by mouth every 6 (six) hours as needed for muscle spasms. 60 tablet 0  . metolazone (ZAROXOLYN) 5 MG tablet Take one tab by mouth daily x 5 days only, then only as directed per MD thereafter. 30 tablet 1  . metoprolol (LOPRESSOR) 50 MG tablet Take 1.5 tablets (75 mg total) by mouth 2 (two) times daily. 180 tablet 3  . Multiple Vitamin (MULTIVITAMIN WITH MINERALS) TABS tablet Take 1 tablet by mouth daily.    . prazosin (MINIPRESS) 1 MG capsule Take 1 capsule (1 mg total) by mouth 2 (two) times daily. 60 capsule 6  . spironolactone (ALDACTONE) 25 MG tablet Take 1 tablet (25 mg total) by mouth daily. 90 tablet 3  . torsemide (DEMADEX) 20 MG tablet Take 2 tablets (40 mg total) by mouth 2 (two) times daily. 120 tablet 6   No current facility-administered medications for this visit.    Allergies:   Review of patient's allergies indicates no known allergies.   Social History:  The patient  reports that he has never smoked. He has never used smokeless tobacco. He reports that he does not drink alcohol or use illicit drugs.   Family History:  The patient's  family history includes Asthma (age of onset: 49) in his father.    ROS:  Please see the history of present illness.   All other systems are reviewed and negative.    PHYSICAL EXAM: VS:  BP 138/88 mmHg  Pulse 59  Ht 5\' 11"  (1.803 m)  Wt 241 lb 9.6 oz (109.589 kg)  BMI 33.71 kg/m2  SpO2 98% , BMI Body mass index is 33.71 kg/(m^2). GEN: chronically ill, in no acute distress HEENT: normal Neck: no JVD,  carotid bruits, or masses Cardiac: RRR; no murmurs, rubs, or gallops,+2 edema  Respiratory:  clear to auscultation bilaterally, normal work of breathing GI: soft, nontender, nondistended, + BS MS: R leg is in a boot Skin: warm and dry  Neuro:  Strength and sensation are intact Psych: euthymic mood, full affect   Recent Labs: 05/21/2015: Magnesium 1.8 05/24/2015: ALT 19 06/07/2015: BUN 19; Creatinine, Ser 0.91; Hemoglobin 9.1*; Platelets 225; Potassium 3.8; Sodium 142    Lipid Panel  No results found for: CHOL, TRIG, HDL, CHOLHDL, VLDL, LDLCALC, LDLDIRECT   Wt Readings from Last 3 Encounters:  01/22/16 241 lb 9.6 oz (109.589 kg)  09/04/15 260 lb (117.935 kg)  06/27/15 260 lb (117.935 kg)     Other studies Reviewed: Additional studies/ records that were reviewed today include:  Dr Raylene Everts notes are reviewed, prior echo is reviewed   ASSESSMENT AND PLAN:   1.  chronic systolic dysfunction Volume has improved with ICM clinic and recently added zaroxolyn Stable on an appropriate medical regimen Normal BiV ICD function See Pace Art report Daily weights, 2 gram sodium restriction encouraged Repeat echo.  If EF remains <35%, will refer our device optimization clinic with Chanetta Marshall NP Bmet, bnp today  2. Atrial tachycardia/ atrial fibrillation Burden is <1% Continue current medicine strategy Cbc today  3. Obesity Weight loss is advised  4. OSA He reports compliance with CPAP  carelink Return to see me in 1 year   Current medicines are reviewed at length with the patient today.   The patient does not have concerns regarding his medicines.  The following changes were made today:  none  Signed, Thompson Grayer, MD  01/22/2016 10:06 AM     [facility] [address] [place]  [address] [phone] (office) [phone] (fax)

## 2016-01-22 NOTE — Patient Instructions (Signed)
Your physician recommends that you continue on your current medications as directed. Please refer to the Current Medication list given to you today. Your physician recommends that you return for lab work today to check your BMET, CBC & BNP. Your physician has requested that you have an echocardiogram. Echocardiography is a painless test that uses sound waves to create images of your heart. It provides your doctor with information about the size and shape of your heart and how well your heart's chambers and valves are working. This procedure takes approximately one hour. There are no restrictions for this procedure. Device check on 04/22/16. Your physician recommends that you schedule a follow-up appointment in: 1 year with Dr. [name]. Please schedule this appointment today before leaving the office.

## 2016-01-23 ENCOUNTER — Ambulatory Visit (INDEPENDENT_AMBULATORY_CARE_PROVIDER_SITE_OTHER): Payer: Medicare Other

## 2016-01-23 ENCOUNTER — Other Ambulatory Visit: Payer: Self-pay

## 2016-01-23 DIAGNOSIS — I5022 Chronic systolic (congestive) heart failure: Secondary | ICD-10-CM

## 2016-01-23 LAB — ECHOCARDIOGRAM COMPLETE
CHL CUP MV DEC (S): 177
CHL CUP PV REG GRAD DIAS: 15 mmHg
CHL CUP REG VEL DIAS: 191 cm/s
E decel time: 177 msec
EERAT: 34.13
FS: 13 % — AB (ref 28–44)
IV/PV OW: 1.12
LA ID, A-P, ES: 55 mm
LA vol A4C: 67.9 ml
LA vol index: 38.4 mL/m2
LADIAMINDEX: 2.31 cm/m2
LAVOL: 91.4 mL
LEFT ATRIUM END SYS DIAM: 55 mm
LV SIMPSON'S DISK: 46
LV TDI E'LATERAL: 4.16
LV TDI E'MEDIAL: 6.38
LV dias vol index: 85 mL/m2
LV dias vol: 202 mL — AB (ref 62–150)
LV e' LATERAL: 4.16 cm/s
LV sys vol index: 46 mL/m2
LV sys vol: 110 mL — AB (ref 21–61)
LVEEAVG: 34.13
LVEEMED: 34.13
LVOT VTI: 16.3 cm
LVOT area: 3.8 cm2
LVOT peak vel: 73.2 cm/s
LVOTD: 22 mm
LVOTSV: 62 mL
MV Peak grad: 8 mmHg
MVPKAVEL: 30.6 m/s
MVPKEVEL: 142 m/s
PW: 12.3 mm — AB (ref 0.6–1.1)
Stroke v: 92 ml
TAPSE: 24.8 mm

## 2016-01-24 ENCOUNTER — Other Ambulatory Visit: Payer: Self-pay | Admitting: Nurse Practitioner

## 2016-01-24 DIAGNOSIS — I5022 Chronic systolic (congestive) heart failure: Secondary | ICD-10-CM

## 2016-01-29 ENCOUNTER — Telehealth: Payer: Self-pay | Admitting: *Deleted

## 2016-01-29 NOTE — Telephone Encounter (Signed)
Notes Recorded by Thompson Grayer, MD on 01/27/2016 at 5:43 PM Results reviewed. [name], please inform pt of result. This was the echo that we were discussing in the office... He has already had it.  He does not need another limited study. Needs appointment with amber. Looks like she was planning echo optimization that day.

## 2016-01-29 NOTE — Telephone Encounter (Signed)
-----   Message from Dionicio Stall, RN sent at 01/28/2016  7:47 AM EDT ----- [name] patient will forward

## 2016-01-29 NOTE — Telephone Encounter (Signed)
Patient informed. Patient is awaiting call back about appt with Amber and echo optimization.

## 2016-02-10 ENCOUNTER — Telehealth: Payer: Self-pay | Admitting: *Deleted

## 2016-02-10 NOTE — Telephone Encounter (Signed)
Patient informed. 

## 2016-02-10 NOTE — Telephone Encounter (Signed)
-----   Message from Dionicio Stall, RN sent at 01/28/2016  7:47 AM EDT ----- [name] patient will forward

## 2016-02-12 ENCOUNTER — Other Ambulatory Visit (HOSPITAL_COMMUNITY): Payer: Medicare Other

## 2016-02-14 ENCOUNTER — Encounter: Payer: Medicare Other | Admitting: Nurse Practitioner

## 2016-02-14 ENCOUNTER — Other Ambulatory Visit (HOSPITAL_COMMUNITY): Payer: Medicare Other

## 2016-02-21 ENCOUNTER — Other Ambulatory Visit: Payer: Self-pay | Admitting: Cardiovascular Disease

## 2016-02-25 NOTE — Progress Notes (Signed)
    AV Optimization Echo Note Date: 02/26/2016  ID:  [name], DOB 1960/08/26, MRN [id number]  PCP: [name], MD Primary Cardiologist: Bronson Ing Electrophysiologist: [name] is a 55 y.o. male is seen today for [name].  He presents today for AV optimization of CRT.   The patient is predominately atrially sensed.  Device was programmed with adaptive CRT programmed on at presentation.  Sensed AV delays were evaluated from 100 msec to 200 msec.  Optimal separation of E/A waves was noted at 150 msec.    Device was reprogrammed today to a SAV of 119msec and a PAV of 119msec. VV optimization done by EKG today.  Pt originally programmed LV3 to LV2 with simultaneous VV timing.  After review of EKG's, device reprogrammed to LV3-LV4, LV first by 50msec.     The patient will return to see me in clinic in 4-6 weeks for follow-up.   Signed, Chanetta Marshall, NP  02/26/2016 3:13 PM     [place] [address] [address] [phone] (office) [phone] (fax)

## 2016-02-26 ENCOUNTER — Ambulatory Visit (INDEPENDENT_AMBULATORY_CARE_PROVIDER_SITE_OTHER): Payer: Medicare Other

## 2016-02-26 ENCOUNTER — Other Ambulatory Visit: Payer: Self-pay | Admitting: Nurse Practitioner

## 2016-02-26 ENCOUNTER — Ambulatory Visit (HOSPITAL_COMMUNITY): Payer: Medicare Other | Attending: Cardiology

## 2016-02-26 ENCOUNTER — Ambulatory Visit (INDEPENDENT_AMBULATORY_CARE_PROVIDER_SITE_OTHER): Payer: Medicare Other | Admitting: Nurse Practitioner

## 2016-02-26 ENCOUNTER — Other Ambulatory Visit: Payer: Self-pay

## 2016-02-26 ENCOUNTER — Ambulatory Visit
Admission: RE | Admit: 2016-02-26 | Discharge: 2016-02-26 | Disposition: A | Discharge status: Home / Self Care | Payer: Medicare Other | Source: Ambulatory Visit | Attending: Nurse Practitioner | Admitting: Nurse Practitioner

## 2016-02-26 DIAGNOSIS — Z6833 Body mass index (BMI) 33.0-33.9, adult: Secondary | ICD-10-CM | POA: Insufficient documentation

## 2016-02-26 DIAGNOSIS — I059 Rheumatic mitral valve disease, unspecified: Secondary | ICD-10-CM | POA: Insufficient documentation

## 2016-02-26 DIAGNOSIS — I5022 Chronic systolic (congestive) heart failure: Secondary | ICD-10-CM

## 2016-02-26 DIAGNOSIS — R29898 Other symptoms and signs involving the musculoskeletal system: Secondary | ICD-10-CM | POA: Insufficient documentation

## 2016-02-26 DIAGNOSIS — I110 Hypertensive heart disease with heart failure: Secondary | ICD-10-CM | POA: Diagnosis not present

## 2016-02-26 DIAGNOSIS — Z95810 Presence of automatic (implantable) cardiac defibrillator: Secondary | ICD-10-CM

## 2016-02-26 DIAGNOSIS — I509 Heart failure, unspecified: Secondary | ICD-10-CM | POA: Diagnosis present

## 2016-02-26 DIAGNOSIS — E119 Type 2 diabetes mellitus without complications: Secondary | ICD-10-CM | POA: Insufficient documentation

## 2016-02-26 LAB — ECHOCARDIOGRAM LIMITED
FS: 14 % — AB (ref 28–44)
IVS/LV PW RATIO, ED: 0.92
LA ID, A-P, ES: 53 mm
LA diam end sys: 53 mm
LA diam index: 2.32 cm/m2
PW: 13.2 mm — AB (ref 0.6–1.1)

## 2016-02-26 NOTE — Progress Notes (Signed)
  EPIC Encounter for ICM Monitoring  Patient Name: [name] is a 55 y.o. male Date: 02/26/2016 Primary Care Physican: [name], MD Primary Cardiologist: Bronson Ing Electrophysiologist: Allred Dry Weight: unknown Bi-V Pacing:  95.3%       Heart Failure questions reviewed, pt asymptomatic.   Patient in the office today for defib check with Chanetta Marshall, NP.  He reported he is doing well.    Thoracic impedence decreased 02/15/2016 to 02/26/2016 suggesting fluid accumulation.  Recommendations:  Patient seen in the office today and recommendations given at that time if needed.    ICM trend: 02/26/2016    Follow-up plan: ICM clinic phone appointment on 03/31/2016.  Appointment with [name] on 03/06/2016.    Copy of ICM check sent to primary cardiologist and device physician.   Rosalene Billings, RN 02/26/2016 3:27 PM

## 2016-02-26 NOTE — Patient Instructions (Signed)
Medication Instructions:   Your physician recommends that you continue on your current medications as directed. Please refer to the Current Medication list given to you today.   If you need a refill on your cardiac medications before your next appointment, please call your pharmacy.  Labwork: NONE ORDER TODAY    Testing/Procedures: NONE ORDER TODAY    Follow-Up: [place]..   Any Other Special Instructions Will Be Listed Below (If Applicable).

## 2016-02-27 LAB — CUP PACEART INCLINIC DEVICE CHECK
Implantable Lead Implant Date: 20150313
Implantable Lead Implant Date: 20150313
Implantable Lead Implant Date: 20150313
Implantable Lead Location: 753858
Implantable Lead Location: 753860
Implantable Lead Model: 6935
MDC IDC LEAD LOCATION: 753859
MDC IDC LEAD MODEL: 4598
MDC IDC SESS DTM: 20170713075412

## 2016-03-06 ENCOUNTER — Ambulatory Visit (INDEPENDENT_AMBULATORY_CARE_PROVIDER_SITE_OTHER): Payer: Medicare Other | Admitting: Cardiovascular Disease

## 2016-03-06 ENCOUNTER — Encounter: Payer: Self-pay | Admitting: Cardiovascular Disease

## 2016-03-06 VITALS — BP 159/97 | HR 71 | Ht 71.0 in | Wt 251.0 lb

## 2016-03-06 DIAGNOSIS — I10 Essential (primary) hypertension: Secondary | ICD-10-CM

## 2016-03-06 DIAGNOSIS — I4891 Unspecified atrial fibrillation: Secondary | ICD-10-CM | POA: Diagnosis not present

## 2016-03-06 DIAGNOSIS — Z95810 Presence of automatic (implantable) cardiac defibrillator: Secondary | ICD-10-CM | POA: Diagnosis not present

## 2016-03-06 DIAGNOSIS — I5022 Chronic systolic (congestive) heart failure: Secondary | ICD-10-CM | POA: Diagnosis not present

## 2016-03-06 MED ORDER — PRAZOSIN HCL 1 MG PO CAPS: 1.0000 mg | ORAL_CAPSULE | Freq: Two times a day (BID) | ORAL | Status: DC

## 2016-03-06 NOTE — Patient Instructions (Signed)
Medication Instructions:  Resume Prazosin 1mg  twice a day  - new sent to [facility] today. Continue all other medications.    Labwork: NONE  Testing/Procedures: NONE  Follow-Up: Your physician wants you to follow up in: 6 months.  You will receive a reminder letter in the mail one-two months in advance.  If you don't receive a letter, please call our office to schedule the follow up appointment   Any Other Special Instructions Will Be Listed Below (If Applicable).  If you need a refill on your cardiac medications before your next appointment, please call your pharmacy.

## 2016-03-06 NOTE — Progress Notes (Signed)
Patient ID: [name], male   DOB: [birth year], 55 y.o.   MRN: [id number]      SUBJECTIVE: The patient returns for follow up for chronic systolic heart failure and has a CRT-D system in place.  He underwent right below the knee application on [id number] for peripheral vascular disease.  Recent echocardiogram showed EF 30-35%. He underwent an AV optimization study. He has recently been evaluated by EP.  Wt 251 lbs (260 09/04/15).  Blood pressure has remained high. Has not been on prazosin for some time. Denies shortness of breath and leg swelling and feels well overall.   Review of Systems: As per "subjective", otherwise negative.  No Known Allergies  Current Outpatient Prescriptions  Medication Sig Dispense Refill  . allopurinol (ZYLOPRIM) 300 MG tablet Take 1 tablet (300 mg total) by mouth daily. 30 tablet 5  . atorvastatin (LIPITOR) 10 MG tablet Take 1 tablet (10 mg total) by mouth every morning. 30 tablet 1  . cloNIDine (CATAPRES) 0.2 MG tablet Take 1 tablet (0.2 mg total) by mouth 2 (two) times daily. 60 tablet 1  . COLCRYS 0.6 MG tablet Take 0.6 mg by mouth daily as needed (gout).     . dabigatran (PRADAXA) 150 MG CAPS capsule Take 1 capsule (150 mg total) by mouth 2 (two) times daily. 60 capsule 6  . digoxin (LANOXIN) 0.125 MG tablet Take 1 tablet (0.125 mg total) by mouth daily. 30 tablet 1  . gemfibrozil (LOPID) 600 MG tablet Take 1 tablet (600 mg total) by mouth 2 (two) times daily before a meal. 60 tablet 1  . HYDROcodone-acetaminophen (NORCO/VICODIN) 5-325 MG tablet Take 1-2 tablets by mouth every 4 (four) hours as needed for moderate pain. 90 tablet 0  . insulin detemir (LEVEMIR) 100 UNIT/ML injection Inject 0.5 mLs (50 Units total) into the skin at bedtime. 10 mL 11  . metFORMIN (GLUCOPHAGE) 1000 MG tablet Take 1,000 mg by mouth 2 (two) times daily.  1  . methocarbamol (ROBAXIN) 500 MG tablet Take 1 tablet (500 mg total) by mouth every 6 (six) hours as needed for muscle  spasms. 60 tablet 0  . metolazone (ZAROXOLYN) 5 MG tablet Take one tab by mouth daily x 5 days only, then only as directed per MD thereafter. 30 tablet 1  . metoprolol (LOPRESSOR) 50 MG tablet TAKE 1 AND 1/2 TABLETS BY MOUTH TWICE DAILY 180 tablet 0  . Multiple Vitamin (MULTIVITAMIN WITH MINERALS) TABS tablet Take 1 tablet by mouth daily.    . prazosin (MINIPRESS) 1 MG capsule Take 1 capsule (1 mg total) by mouth 2 (two) times daily. 60 capsule 6  . spironolactone (ALDACTONE) 25 MG tablet Take 1 tablet (25 mg total) by mouth daily. 90 tablet 3  . torsemide (DEMADEX) 20 MG tablet TAKE TWO (2) TABLETS BY MOUTH TWICE DAILY. 120 tablet 0   No current facility-administered medications for this visit.    Past Medical History  Diagnosis Date  . Hypertension   . Morbid obesity ([place])   . Atrial flutter ([place])  10/01/2006, 12/30/11    Status post RF ablation, 7/13, Dr. [name]  . OSA (obstructive sleep apnea)     compliant with CPAP  . Nonischemic cardiomyopathy ([place])     Normal coronary arteries, 2008  . Chronic systolic dysfunction of left ventricle     EF 20-25%, global HK; mild RVD, 2-D echo, 5/13  . Unspecified disorder of kidney and ureter   . RBBB, anterior fascicular block and incomplete LBBB   .  Pulmonary hypertension (HCC)     RVSP 50-55 mmHg, 5/13  . Valvular heart disease     Mild MR/TR, 2-D echo, 5/13  . Type II or unspecified type diabetes mellitus without mention of complication, not stated as uncontrolled     Past Surgical History  Procedure Laterality Date  . Bi-ventricular implantable cardioverter defibrillator  (crt-d)  10/27/2013    MDT [name] XT CRTD implanted by [name] for NICM, CHF  . Atrial flutter ablation N/A 03/04/2012    Procedure: ATRIAL FLUTTER ABLATION;  Surgeon: Thompson Grayer, MD;  Location: [facility] CATH LAB;  Service: Cardiovascular;  Laterality: N/A;  . Bi-ventricular implantable cardioverter defibrillator N/A 10/27/2013    Procedure: BI-VENTRICULAR  IMPLANTABLE CARDIOVERTER DEFIBRILLATOR  (CRT-D);  Surgeon: Coralyn Mark, MD;  Location: [facility] CATH LAB;  Service: Cardiovascular;  Laterality: N/A;  . Cystoscopy/retrograde/ureteroscopy Bilateral 10/16/2014    Procedure: Kathrene Alu WITH BILATERAL Freddie Breech WITH GYRUS;  Surgeon: Malka So, MD;  Location: [facility];  Service: Urology;  Laterality: Bilateral;  . Transurethral resection of bladder tumor with gyrus (turbt-gyrus) N/A 10/16/2014    Procedure: TRANSURETHRAL RESECTION OF BLADDER TUMOR WITH GYRUS (TURBT-GYRUS);  Surgeon: Malka So, MD;  Location: [facility];  Service: Urology;  Laterality: N/A;  . Cataract extraction w/phaco Right 03/25/2015    Procedure: CATARACT EXTRACTION PHACO AND INTRAOCULAR LENS PLACEMENT RIGHT EYE CDE=12.24;  Surgeon: Tonny Branch, MD;  Location: [facility];  Service: Ophthalmology;  Laterality: Right;  . Amputation Right 05/21/2015    Procedure: AMPUTATION BELOW KNEE;  Surgeon: Newt Minion, MD;  Location: [facility];  Service: Orthopedics;  Laterality: Right;    Social History   Social History  . Marital Status: Single    Spouse Name: N/A  . Number of Children: N/A  . Years of Education: N/A   Occupational History  . DISABLED    Social History Main Topics  . Smoking status: Never Smoker   . Smokeless tobacco: Never Used  . Alcohol Use: No  . Drug Use: No  . Sexual Activity: No   Other Topics Concern  . Not on file   Social History Narrative   Pt lives in [place] alone.  Disabled.  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     Filed Vitals:   03/06/16 1035  BP: 159/97  Pulse: 71  Height: 5\' 11"  (1.803 m)  Weight: 251 lb (113.853 kg)    PHYSICAL EXAM General: NAD HEENT: Normal. Neck: No JVD, no thyromegaly. Lungs: Clear. CV: Regular rate and rhythm, normal [id number] [id number], 1/6 systolic murmur along left sternal border and over RUSB. Right leg BKA. Left leg without edema. Abdomen: Soft, nontender, obese, no distention.  Neurologic: Alert and  oriented x 3.  Psych: Normal affect. Skin: Stasis dermatitis of left leg. Musculoskeletal: Right BKA.    ECG: Most recent ECG reviewed.      ASSESSMENT AND PLAN: 1. Chronic systolic heart failure/nonischemic cardiomyopathy s/p CRTD, EF 35%: Euvolemic. No changes.  2. Essential HTN: Elevated. Will resume prazosin 1 mg twice daily.  3. Atrial fibrillation: HR controlled and CRT-D in place. Continue Pradaxa. No bleeding problems.  Dispo: f/u 6 months.  Kate Sable, M.D., F.A.C.C.

## 2016-03-06 NOTE — Addendum Note (Signed)
Addended by: Laurine Blazer on: 03/06/2016 11:09 AM   Modules accepted: Orders

## 2016-03-10 IMAGING — CT CT FOOT*R* W/O CM
4 of 5 series · 15 of 33 positions shown, 18 images · non-contrast
Comparison: 10/14/2014

CLINICAL DATA: Chronic osteomyelitis of the right foot. Lytic
lesion along the second metatarsal.

EXAM:
CT OF THE RIGHT FOOT WITHOUT CONTRAST
TECHNIQUE: Multidetector CT imaging of the right foot was performed according
to the standard protocol. Multiplanar CT image reconstructions were
also generated.

[Series 4: rt foot st · axial · 0.51mm/px · z∈[-240,-96]mm · 5 of 110 slices shown, 7 images]
[im 19/110  soft-tissue]
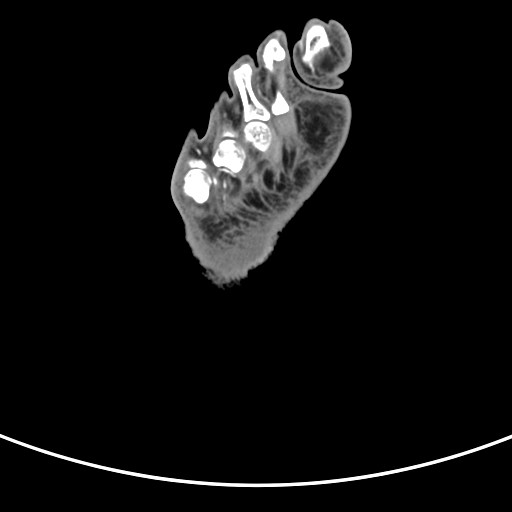
[im 19/110  bone]
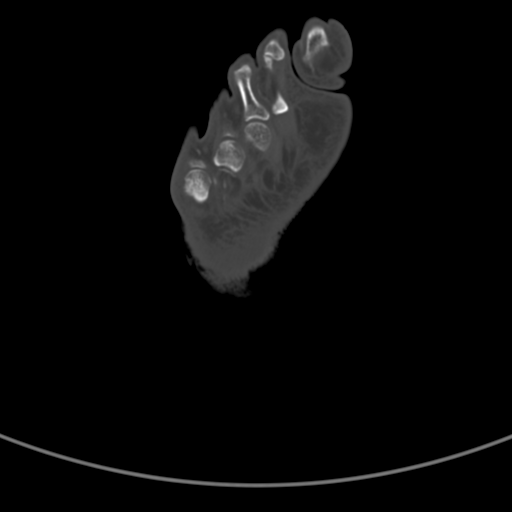
[im 37/110  bone]
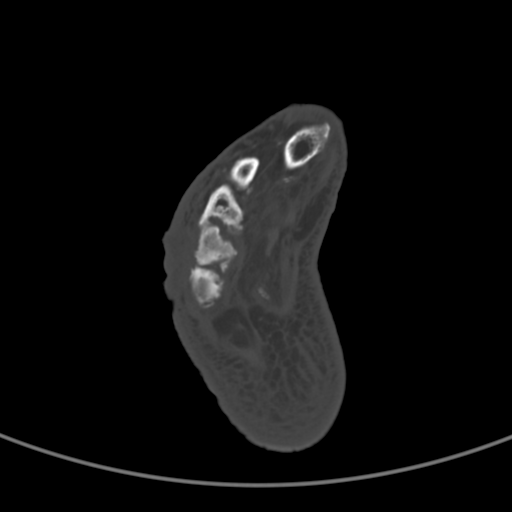
[im 55/110  bone]
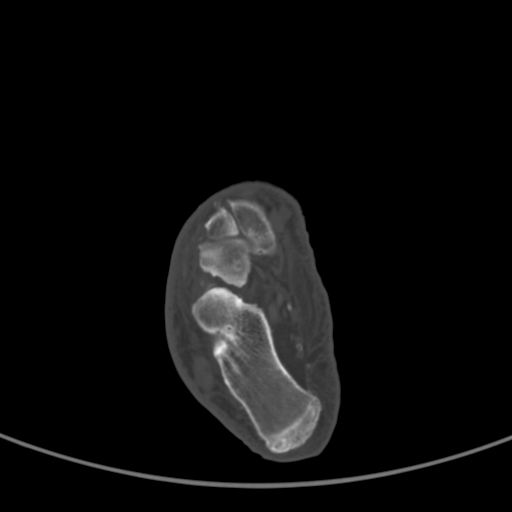
[im 73/110  bone]
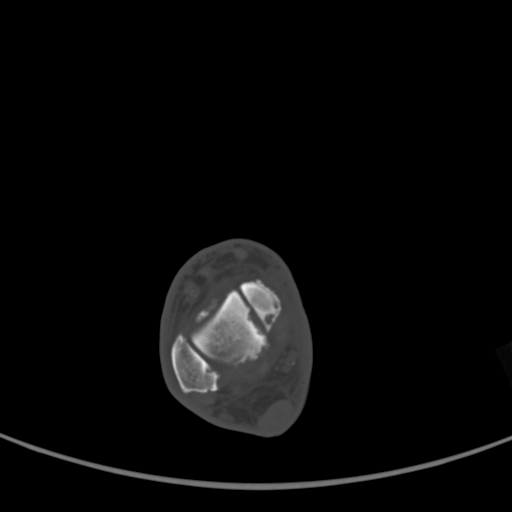
[im 91/110  soft-tissue]
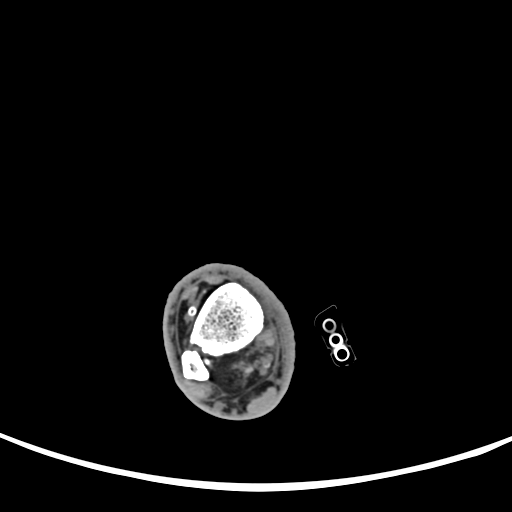
[im 91/110  bone]
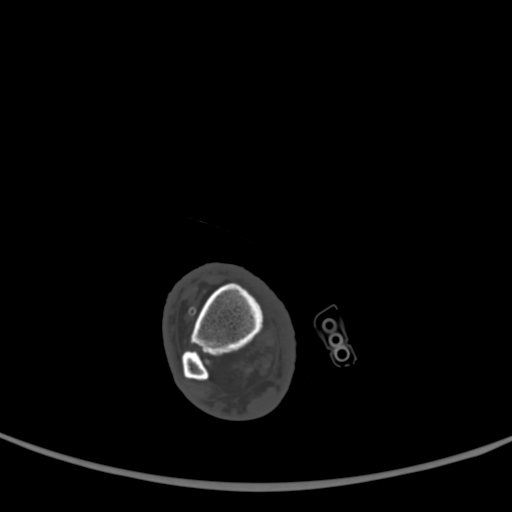

[Series 604: <mpr thick range(2)> · axial · 0.51mm/px · z∈[-253,-192]mm · 3 of 97 slices shown]
[im 17/97  bone]
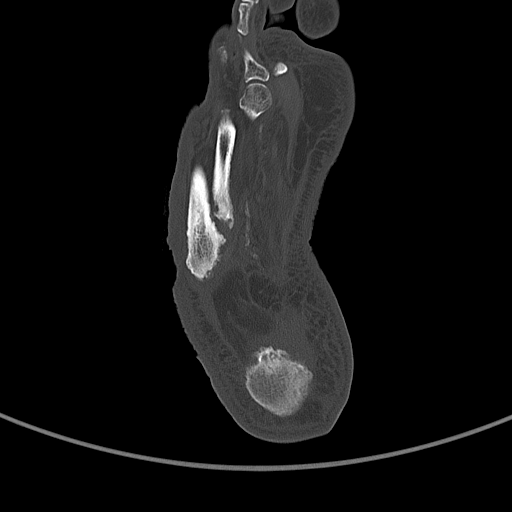
[im 33/97  bone]
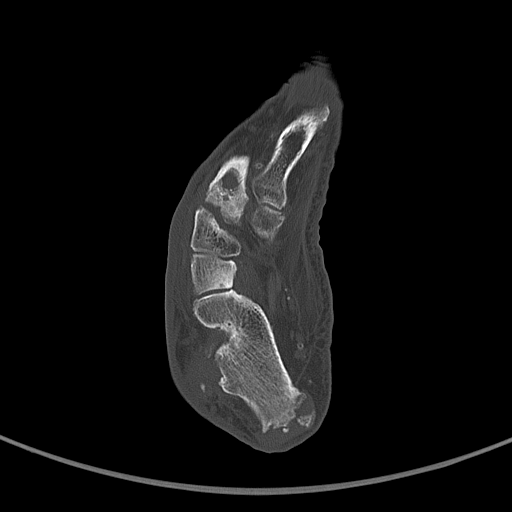
[im 49/97  bone]
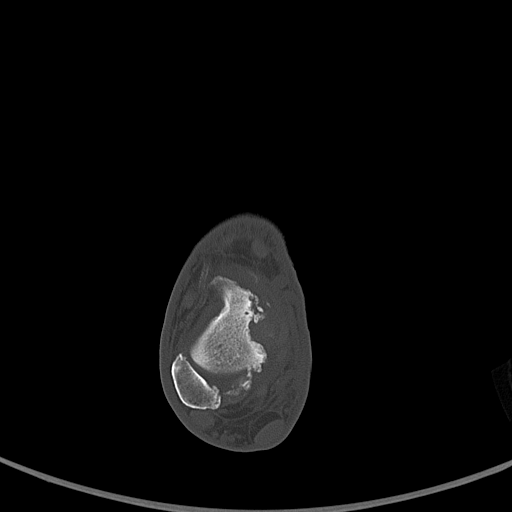

[Series 605: <mpr thick range(3)> · sagittal · 0.51mm/px · 5 of 58 slices shown, 6 images]
[im 20/58  bone]
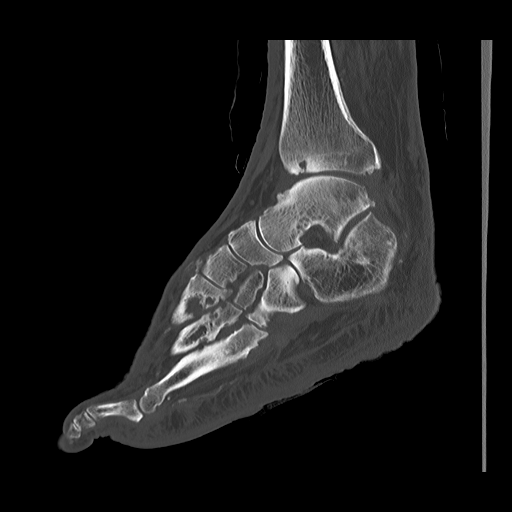
[im 24/58  bone]
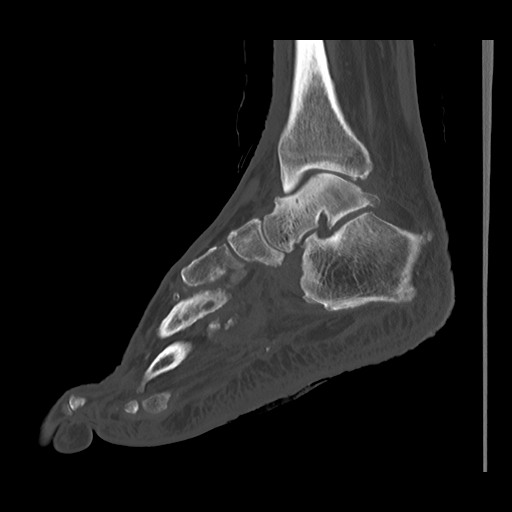
[im 29/58  soft-tissue]
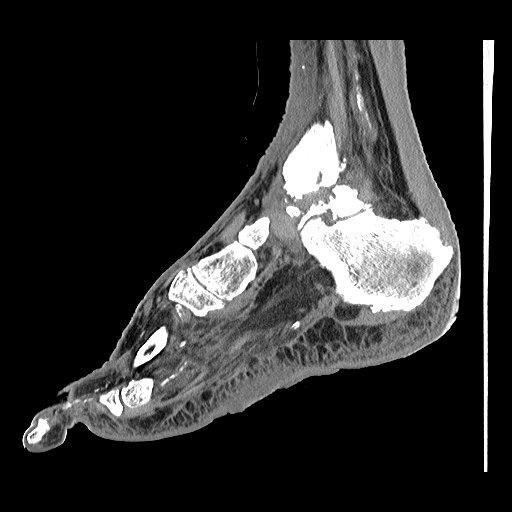
[im 29/58  bone]
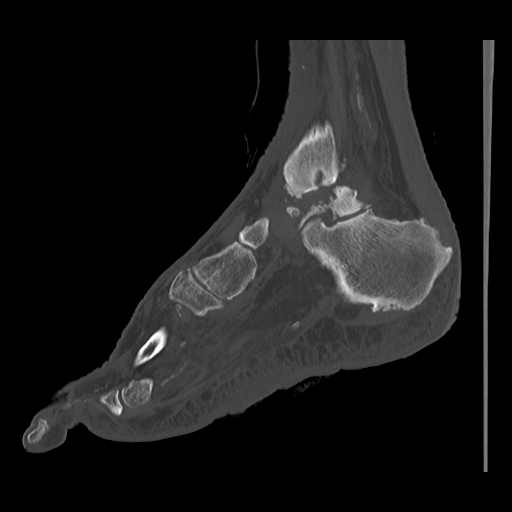
[im 34/58  bone]
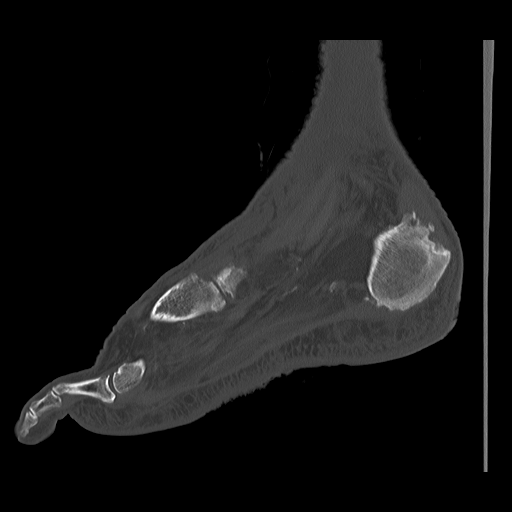
[im 39/58  bone]
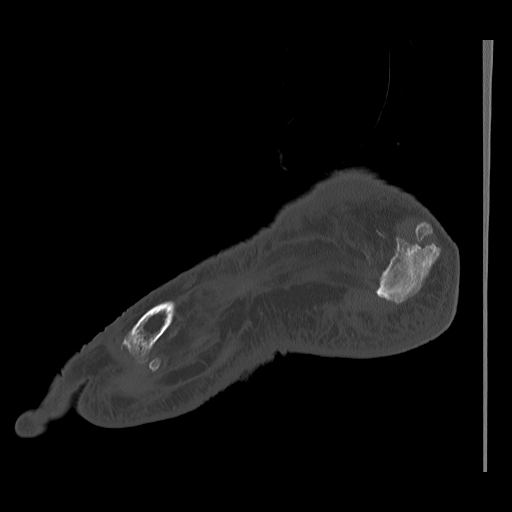

[Series 606: <mpr thick range(4)> · coronal · 0.51mm/px · 2 of 122 slices shown]
[im 41/122  bone]
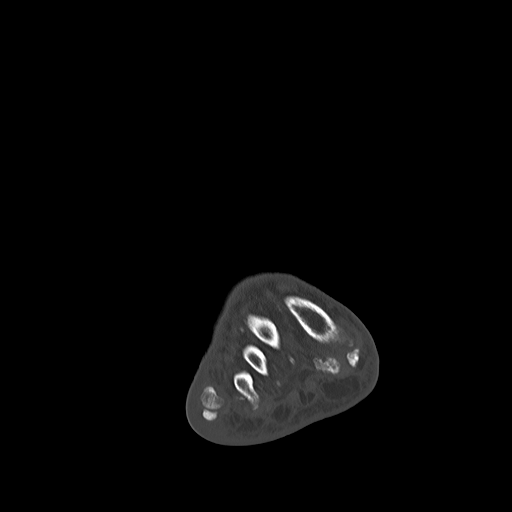
[im 81/122  bone]
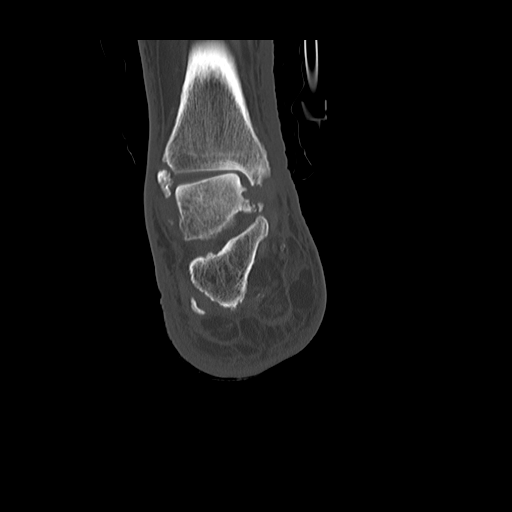

[15 of 33 positions shown; findings below may reference images not displayed]

FINDINGS: Erosions, some with overhanging edges and cortical irregularity, are
observed along the distal fibula; distal tibia; medial talus
proximally; the CT if the calcaneocuboid articulation; the distal
navicular; along much of the Lisfranc joint; and along the first
metatarsophalangeal joint. Some of these erosions are along the
intermetatarsal bursal region and not directly along the joint
itself, including the larger erosion along the base of the second
metatarsal which measures proximally 1.6 by 1.0 cm on image 21 of
series 605. I do not see a bony sequestrum within this erosion
although there is some lucency in the marginal bone as shown on
image 22 series 605. There are some spiculations bone in some of the
other erosions, such as in the medial malleolar erosion on image 30
of series 605.

There is diffuse subcutaneous and cutaneous edema along the ankle
and foot, most severely laterally were there is skin irregularity,
wrinkling, and quite possibly ulceration -correlation with physical
inspection suggested. Small bony fragments are present below the
medial and lateral malleolus could. Highly attenuated distal
tibialis posterior tendon, distal to the accessory navicular.
Atrophic plantar musculature in the foot. Small amount of gas in the
subcutaneous tissues along the dorsal lateral foot, image 66 series
606.

Plantar and Achilles calcaneal spurs are somewhat fragmented and
irregular.
IMPRESSION: 1. Suspected cellulitis and probable lateral ulceration along the
foot, with a tiny amount of gas in the subcutaneous tissues.
2. Large marginal erosions with overhanging edges throughout much of
the hindfoot and midfoot and also at the first metatarsophalangeal
joint. The largest erosive lesion is at the second metatarsal base.
Given the numerous areas involvement, gout is considered of fairly
high probability. Chronic multifocal osteomyelitis seems less
likely. That said, there is likely soft tissue infection laterally.
MRI can sometimes be useful in differentiating gout from
osteomyelitis based on the degree of associated edema, but I do note
that the patient has a pacemaker. Needle aspiration of the second
toe cavity may be warranted. It might also be helpful to image the
contralateral foot to look for signs of gout.

## 2016-03-31 ENCOUNTER — Ambulatory Visit (INDEPENDENT_AMBULATORY_CARE_PROVIDER_SITE_OTHER): Payer: Medicare Other

## 2016-03-31 DIAGNOSIS — I5022 Chronic systolic (congestive) heart failure: Secondary | ICD-10-CM | POA: Diagnosis not present

## 2016-03-31 DIAGNOSIS — Z95810 Presence of automatic (implantable) cardiac defibrillator: Secondary | ICD-10-CM

## 2016-03-31 NOTE — Progress Notes (Signed)
EPIC Encounter for ICM Monitoring  Patient Name: [name] is a 55 y.o. male Date: 03/31/2016 Primary Care Physican: [name], MD Primary Cardiologist: Bronson Ing Electrophysiologist: Allred Dry Weight: 251 lb  Bi-V Pacing:  98.1%       Heart Failure questions reviewed, pt did have some shortness of breath during decreased impedance but has resolved now.    Thoracic impedance abnormal suggesting fluid accumulation 03/10/2016 to 03/28/2016 and returned to baseline 03/28/2016.  Recommendations: No changes.  Low sodium diet education provided.    Follow-up plan: ICM clinic phone appointment on 05/19/2016.  Office appointment with Chanetta Marshall, NP on 04/15/2016.  Copy of ICM check sent to device physician.   ICM trend: 03/31/2016       Rosalene Billings, RN 03/31/2016 9:07 AM

## 2016-04-13 NOTE — Progress Notes (Signed)
Electrophysiology Office Note Date: 04/15/2016  ID:  BRANDONN [name], DOB 1961/06/29, MRN [id number]  PCP: [name], MD Primary Cardiologist: Bronson Ing Electrophysiologist: Allred  CC: HF follow up post CRT optimization  [name] is a 55 y.o. male seen today for [name].  He was recently seen by me and underwent AV and VV optimization.  He presents today for routine electrophysiology followup.  Since last being seen in our clinic, the patient reports doing reasonably well.  He feels that his HF symptoms may have worsened slightly post CRT optimization.  Weights have been stable.  He reports medication and dietary compliance.  He denies chest pain, palpitations, PND, orthopnea, nausea, vomiting, dizziness, syncope, edema, weight gain, or early satiety.  He has not had ICD shocks.   Device History: MDT CRTD implanted 2015 for NICM, CHF History of appropriate therapy: No History of AAD therapy: No   Past Medical History:  Diagnosis Date  . Atrial flutter ([place])  10/01/2006, 12/30/11   Status post RF ablation, 7/13, Dr. [name]  . Chronic systolic dysfunction of left ventricle    EF 20-25%, global HK; mild RVD, 2-D echo, 5/13  . Hypertension   . Morbid obesity ([place])   . Nonischemic cardiomyopathy ([place])    Normal coronary arteries, 2008  . OSA (obstructive sleep apnea)    compliant with CPAP  . Pulmonary hypertension (HCC)    RVSP 50-55 mmHg, 5/13  . RBBB, anterior fascicular block and incomplete LBBB   . Type II or unspecified type diabetes mellitus without mention of complication, not stated as uncontrolled   . Unspecified disorder of kidney and ureter   . Valvular heart disease    Mild MR/TR, 2-D echo, 5/13   Past Surgical History:  Procedure Laterality Date  . AMPUTATION Right 05/21/2015   Procedure: AMPUTATION BELOW KNEE;  Surgeon: Newt Minion, MD;  Location: [facility];  Service: Orthopedics;  Laterality: Right;  . ATRIAL FLUTTER ABLATION N/A 03/04/2012   Procedure: ATRIAL FLUTTER ABLATION;  Surgeon: Thompson Grayer, MD;  Location: [facility] CATH LAB;  Service: Cardiovascular;  Laterality: N/A;  . BI-VENTRICULAR IMPLANTABLE CARDIOVERTER DEFIBRILLATOR N/A 10/27/2013   Procedure: BI-VENTRICULAR IMPLANTABLE CARDIOVERTER DEFIBRILLATOR  (CRT-D);  Surgeon: Coralyn Mark, MD;  Location: [facility] CATH LAB;  Service: Cardiovascular;  Laterality: N/A;  . BI-VENTRICULAR IMPLANTABLE CARDIOVERTER DEFIBRILLATOR  (CRT-D)  10/27/2013   MDT [name] XT CRTD implanted by [name] for NICM, CHF  . CATARACT EXTRACTION W/PHACO Right 03/25/2015   Procedure: CATARACT EXTRACTION PHACO AND INTRAOCULAR LENS PLACEMENT RIGHT EYE CDE=12.24;  Surgeon: Tonny Branch, MD;  [facility];  Service: Ophthalmology;  Laterality: Right;  . CYSTOSCOPY/RETROGRADE/URETEROSCOPY Bilateral 10/16/2014   Procedure: CYSTO WITH BILATERAL Freddie Breech WITH GYRUS;  Surgeon: Malka So, MD;  Location: [facility];  Service: Urology;  Laterality: Bilateral;  . TRANSURETHRAL RESECTION OF BLADDER TUMOR WITH GYRUS (TURBT-GYRUS) N/A 10/16/2014   Procedure: TRANSURETHRAL RESECTION OF BLADDER TUMOR WITH GYRUS (TURBT-GYRUS);  Surgeon: Malka So, MD;  Location: [facility];  Service: Urology;  Laterality: N/A;    Current Outpatient Prescriptions  Medication Sig Dispense Refill  . allopurinol (ZYLOPRIM) 300 MG tablet Take 1 tablet (300 mg total) by mouth daily. 30 tablet 5  . atorvastatin (LIPITOR) 10 MG tablet Take 1 tablet (10 mg total) by mouth every morning. 30 tablet 1  . cloNIDine (CATAPRES) 0.2 MG tablet Take 1 tablet (0.2 mg total) by mouth 2 (two) times daily. 60 tablet 1  . COLCRYS 0.6 MG  tablet Take 0.6 mg by mouth daily as needed (gout).     . dabigatran (PRADAXA) 150 MG CAPS capsule Take 1 capsule (150 mg total) by mouth 2 (two) times daily. 60 capsule 11  . digoxin (LANOXIN) 0.125 MG tablet Take 1 tablet (0.125 mg total) by mouth daily. 30 tablet 1  . gemfibrozil (LOPID) 600 MG tablet Take 1 tablet (600 mg  total) by mouth 2 (two) times daily before a meal. 60 tablet 1  . HYDROcodone-acetaminophen (NORCO/VICODIN) 5-325 MG tablet Take 1-2 tablets by mouth every 4 (four) hours as needed for moderate pain. 90 tablet 0  . insulin detemir (LEVEMIR) 100 UNIT/ML injection Inject 0.5 mLs (50 Units total) into the skin at bedtime. 10 mL 11  . LITE TOUCH PEN NEEDLES 31G X 5 MM MISC USE AS DIRECTED UP TO 5 INJECTIONS DAILY  7  . metFORMIN (GLUCOPHAGE) 1000 MG tablet Take 1,000 mg by mouth 2 (two) times daily.  1  . methocarbamol (ROBAXIN) 500 MG tablet Take 1 tablet (500 mg total) by mouth every 6 (six) hours as needed for muscle spasms. 60 tablet 0  . metolazone (ZAROXOLYN) 5 MG tablet Take one tab by mouth daily x 5 days only, then only as directed per MD thereafter. 30 tablet 1  . metoprolol (LOPRESSOR) 50 MG tablet TAKE 1 AND 1/2 TABLETS BY MOUTH TWICE DAILY 180 tablet 0  . Multiple Vitamin (MULTIVITAMIN WITH MINERALS) TABS tablet Take 1 tablet by mouth daily.    . prazosin (MINIPRESS) 1 MG capsule Take 1 capsule (1 mg total) by mouth 2 (two) times daily. 60 capsule 6  . spironolactone (ALDACTONE) 25 MG tablet Take 1 tablet (25 mg total) by mouth daily. 90 tablet 3  . torsemide (DEMADEX) 20 MG tablet TAKE TWO (2) TABLETS BY MOUTH TWICE DAILY. 120 tablet 11  . ULTICARE MINI PEN NEEDLES 31G X 6 MM MISC 1 each by Does not apply route daily.     [name] NOVOLOG FLEXPEN 100 UNIT/ML FlexPen INJECT SUBCUTANEOUSLY 25 UNITS IN THE MORNING AND 25 AT NOON AND 35 UNITS AT DINNER  6   No current facility-administered medications for this visit.     Allergies:   Review of patient's allergies indicates no known allergies.   Social History: Social History   Social History  . Marital status: Single    Spouse name: N/A  . Number of children: N/A  . Years of education: N/A   Occupational History  . DISABLED    Social History Main Topics  . Smoking status: Never Smoker  . Smokeless tobacco: Never Used  . Alcohol use  No  . Drug use: No  . Sexual activity: No   Other Topics Concern  . Not on file   Social History Narrative   Pt lives in [place] alone.  Disabled.  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    Family History: Family History  Problem Relation Age of Onset  . Asthma Father 29    DECEASED  . Diabetes    . Heart attack Mother     Review of Systems: All other systems reviewed and are otherwise negative except as noted above.   Physical Exam: VS:  BP 120/70   Pulse 60   Ht 5\' 11"  (1.803 m)   Wt 253 lb 9.6 oz (115 kg)   BMI 35.37 kg/m  , BMI Body mass index is 35.37 kg/m.  GEN- The patient is well appearing, alert and oriented x 3  today.   HEENT: normocephalic, atraumatic; sclera clear, conjunctiva pink; hearing intact; oropharynx clear; neck supple Lungs- Clear to ausculation bilaterally, normal work of breathing.  No wheezes, rales, rhonchi Heart- Regular rate and rhythm, 1/6SEM GI- soft, non-tender, non-distended, bowel sounds present Extremities- no clubbing, cyanosis, or edema, prosthesis R leg  MS- no significant deformity or atrophy Skin- warm and dry, no rash or lesion; ICD pocket well healed Psych- euthymic mood, full affect Neuro- strength and sensation are intact  ICD interrogation- reviewed in detail today,  See PACEART report  Recent Labs: 05/21/2015: Magnesium 1.8 05/24/2015: ALT 19 06/07/2015: BUN 19; Creatinine, Ser 0.91; Hemoglobin 9.1; Platelets 225; Potassium 3.8; Sodium 142   Wt Readings from Last 3 Encounters:  04/15/16 253 lb 9.6 oz (115 kg)  03/06/16 251 lb (113.9 kg)  01/22/16 241 lb 9.6 oz (109.6 kg)     Assessment and Plan:  1.  Chronic systolic dysfunction euvolemic today Stable on an appropriate medical regimen Normal ICD function See Pace Art report Device reprogrammed to PAV 170 (was 180), SAV 120 (was 150), VV delay 56msec (was 58msec) Continue follow up in ICM clinic  Unclear why not on ACE-I. He does have some renal  insufficiency. Will defer to [name]  2.  Paroxysmal atrial fibrillation Burden by device interrogation today 0% Continue Pradaxa for CHADS2VASC of 3  3.  OSA Compliance with CPAP encouraged  4.  HTN Stable No change required today  5.  Obesity Body mass index is 35.37 kg/m. Weight loss encouraged    Current medicines are reviewed at length with the patient today.   The patient does not have concerns regarding his medicines.  The following changes were made today:  none  Labs/ tests ordered today include: BMET, Dig level Orders Placed This Encounter  Procedures  . Basic metabolic panel  . Digoxin level     Disposition:   Follow up with ICM clinic, [facility], [name] in 4 weeks in [place] to evaluate changes from today's visit.    Signed, Chanetta Marshall, NP 04/15/2016 10:12 AM  [place]  [phone] (office) [phone] (fax

## 2016-04-15 ENCOUNTER — Encounter: Payer: Self-pay | Admitting: Nurse Practitioner

## 2016-04-15 ENCOUNTER — Encounter: Payer: Self-pay | Admitting: Internal Medicine

## 2016-04-15 ENCOUNTER — Ambulatory Visit (INDEPENDENT_AMBULATORY_CARE_PROVIDER_SITE_OTHER): Payer: Medicare Other | Admitting: Nurse Practitioner

## 2016-04-15 VITALS — BP 120/70 | HR 60 | Ht 71.0 in | Wt 253.6 lb

## 2016-04-15 DIAGNOSIS — I480 Paroxysmal atrial fibrillation: Secondary | ICD-10-CM | POA: Diagnosis not present

## 2016-04-15 DIAGNOSIS — I5022 Chronic systolic (congestive) heart failure: Secondary | ICD-10-CM

## 2016-04-15 DIAGNOSIS — G4733 Obstructive sleep apnea (adult) (pediatric): Secondary | ICD-10-CM | POA: Diagnosis not present

## 2016-04-15 DIAGNOSIS — Z79899 Other long term (current) drug therapy: Secondary | ICD-10-CM

## 2016-04-15 DIAGNOSIS — Z9989 Dependence on other enabling machines and devices: Secondary | ICD-10-CM

## 2016-04-15 DIAGNOSIS — I10 Essential (primary) hypertension: Secondary | ICD-10-CM

## 2016-04-15 LAB — BASIC METABOLIC PANEL
BUN: 49 mg/dL — AB (ref 7–25)
CHLORIDE: 103 mmol/L (ref 98–110)
CO2: 18 mmol/L — AB (ref 20–31)
CREATININE: 1.29 mg/dL (ref 0.70–1.33)
Calcium: 8.4 mg/dL — ABNORMAL LOW (ref 8.6–10.3)
GLUCOSE: 200 mg/dL — AB (ref 65–99)
Potassium: 4.1 mmol/L (ref 3.5–5.3)
Sodium: 136 mmol/L (ref 135–146)

## 2016-04-15 LAB — DIGOXIN LEVEL: Digoxin Level: 0.6 ug/L — ABNORMAL LOW (ref 0.8–2.0)

## 2016-04-15 MED ORDER — DABIGATRAN ETEXILATE MESYLATE 150 MG PO CAPS: 150.0000 mg | ORAL_CAPSULE | Freq: Two times a day (BID) | ORAL | 11 refills | Status: DC

## 2016-04-15 MED ORDER — TORSEMIDE 20 MG PO TABS: ORAL_TABLET | ORAL | 11 refills | Status: DC

## 2016-04-15 MED ORDER — SPIRONOLACTONE 25 MG PO TABS
25.0000 mg | ORAL_TABLET | Freq: Every day | ORAL | 3 refills | Status: DC
Start: 2016-04-15 — End: 2017-04-20

## 2016-04-15 NOTE — Patient Instructions (Addendum)
Medication Instructions:  Your physician recommends that you continue on your current medications as directed. Please refer to the Current Medication list given to you today.  Labwork: Your physician recommends that you have lab work today- BMET and Digoxin Level   Testing/Procedures: NONE  Follow-Up: Your physician recommends that you schedule a follow-up appointment in: 1 month with Dr. [name] in [place].   Any Other Special Instructions Will Be Listed Below (If Applicable).     If you need a refill on your cardiac medications before your next appointment, please call your pharmacy.

## 2016-04-16 ENCOUNTER — Other Ambulatory Visit: Payer: Self-pay | Admitting: Cardiovascular Disease

## 2016-04-17 LAB — CUP PACEART INCLINIC DEVICE CHECK
Date Time Interrogation Session: 20170901081840
Implantable Lead Implant Date: 20150313
Implantable Lead Location: 753858
Implantable Lead Location: 753859
Implantable Lead Location: 753860
Implantable Lead Model: 5076
MDC IDC LEAD IMPLANT DT: 20150313
MDC IDC LEAD IMPLANT DT: 20150313
MDC IDC LEAD MODEL: 4598

## 2016-04-22 ENCOUNTER — Ambulatory Visit (INDEPENDENT_AMBULATORY_CARE_PROVIDER_SITE_OTHER): Payer: Medicare Other | Admitting: *Deleted

## 2016-04-22 DIAGNOSIS — I429 Cardiomyopathy, unspecified: Secondary | ICD-10-CM

## 2016-04-22 DIAGNOSIS — Z95810 Presence of automatic (implantable) cardiac defibrillator: Secondary | ICD-10-CM

## 2016-04-22 NOTE — Progress Notes (Signed)
Remote ICD transmission.   

## 2016-04-24 ENCOUNTER — Encounter: Payer: Self-pay | Admitting: Cardiology

## 2016-04-24 ENCOUNTER — Other Ambulatory Visit (HOSPITAL_COMMUNITY)
Admission: AD | Admit: 2016-04-24 | Discharge: 2016-04-24 | Disposition: A | Discharge status: Home / Self Care | Payer: Medicare Other | Source: Other Acute Inpatient Hospital | Attending: Urology | Admitting: Urology

## 2016-04-24 ENCOUNTER — Ambulatory Visit (INDEPENDENT_AMBULATORY_CARE_PROVIDER_SITE_OTHER): Payer: Medicare Other | Admitting: Urology

## 2016-04-24 DIAGNOSIS — N3289 Other specified disorders of bladder: Secondary | ICD-10-CM

## 2016-04-24 DIAGNOSIS — R3121 Asymptomatic microscopic hematuria: Secondary | ICD-10-CM

## 2016-04-24 LAB — URINALYSIS, ROUTINE W REFLEX MICROSCOPIC
Bilirubin Urine: NEGATIVE
GLUCOSE, UA: 250 mg/dL — AB
Ketones, ur: NEGATIVE mg/dL
LEUKOCYTES UA: NEGATIVE
Nitrite: NEGATIVE
SPECIFIC GRAVITY, URINE: 1.025 (ref 1.005–1.030)
pH: 5.5 (ref 5.0–8.0)

## 2016-04-24 LAB — URINE MICROSCOPIC-ADD ON

## 2016-05-02 LAB — CUP PACEART REMOTE DEVICE CHECK
Battery Remaining Longevity: 63 mo
Battery Voltage: 2.97 V
Brady Statistic RA Percent Paced: 28.13 %
Date Time Interrogation Session: 20170906052507
HIGH POWER IMPEDANCE MEASURED VALUE: 68 Ohm
Implantable Lead Implant Date: 20150313
Implantable Lead Implant Date: 20150313
Implantable Lead Model: 4598
Implantable Lead Model: 5076
Lead Channel Impedance Value: 285 Ohm
Lead Channel Impedance Value: 646 Ohm
Lead Channel Impedance Value: 836 Ohm
Lead Channel Impedance Value: 874 Ohm
Lead Channel Impedance Value: 893 Ohm
Lead Channel Impedance Value: 950 Ohm
Lead Channel Pacing Threshold Amplitude: 1.25 V
Lead Channel Pacing Threshold Pulse Width: 0.4 ms
Lead Channel Pacing Threshold Pulse Width: 0.4 ms
Lead Channel Sensing Intrinsic Amplitude: 9.875 mV
Lead Channel Setting Pacing Amplitude: 2 V
Lead Channel Setting Pacing Amplitude: 2.5 V
Lead Channel Setting Pacing Pulse Width: 0.4 ms
Lead Channel Setting Sensing Sensitivity: 0.3 mV
MDC IDC LEAD IMPLANT DT: 20150313
MDC IDC LEAD LOCATION: 753858
MDC IDC LEAD LOCATION: 753859
MDC IDC LEAD LOCATION: 753860
MDC IDC MSMT LEADCHNL LV IMPEDANCE VALUE: 456 Ohm
MDC IDC MSMT LEADCHNL LV IMPEDANCE VALUE: 513 Ohm
MDC IDC MSMT LEADCHNL LV IMPEDANCE VALUE: 532 Ohm
MDC IDC MSMT LEADCHNL LV IMPEDANCE VALUE: 551 Ohm
MDC IDC MSMT LEADCHNL LV IMPEDANCE VALUE: 931 Ohm
MDC IDC MSMT LEADCHNL LV PACING THRESHOLD AMPLITUDE: 0.875 V
MDC IDC MSMT LEADCHNL RA IMPEDANCE VALUE: 418 Ohm
MDC IDC MSMT LEADCHNL RA SENSING INTR AMPL: 3 mV
MDC IDC MSMT LEADCHNL RA SENSING INTR AMPL: 3 mV
MDC IDC MSMT LEADCHNL RV IMPEDANCE VALUE: 399 Ohm
MDC IDC MSMT LEADCHNL RV PACING THRESHOLD AMPLITUDE: 0.875 V
MDC IDC MSMT LEADCHNL RV PACING THRESHOLD PULSEWIDTH: 0.4 ms
MDC IDC MSMT LEADCHNL RV SENSING INTR AMPL: 9.875 mV
MDC IDC SET LEADCHNL LV PACING PULSEWIDTH: 0.4 ms
MDC IDC SET LEADCHNL RV PACING AMPLITUDE: 2.5 V
MDC IDC STAT BRADY AP VP PERCENT: 28.08 %
MDC IDC STAT BRADY AP VS PERCENT: 0.05 %
MDC IDC STAT BRADY AS VP PERCENT: 71.15 %
MDC IDC STAT BRADY AS VS PERCENT: 0.72 %
MDC IDC STAT BRADY RV PERCENT PACED: 98.32 %

## 2016-05-15 ENCOUNTER — Encounter: Payer: Self-pay | Admitting: Internal Medicine

## 2016-05-15 ENCOUNTER — Ambulatory Visit (INDEPENDENT_AMBULATORY_CARE_PROVIDER_SITE_OTHER): Payer: Medicare Other | Admitting: Internal Medicine

## 2016-05-15 VITALS — BP 145/82 | HR 65 | Ht 71.0 in | Wt 260.2 lb

## 2016-05-15 DIAGNOSIS — I4891 Unspecified atrial fibrillation: Secondary | ICD-10-CM | POA: Diagnosis not present

## 2016-05-15 DIAGNOSIS — I10 Essential (primary) hypertension: Secondary | ICD-10-CM

## 2016-05-15 DIAGNOSIS — I5023 Acute on chronic systolic (congestive) heart failure: Secondary | ICD-10-CM

## 2016-05-15 DIAGNOSIS — G4733 Obstructive sleep apnea (adult) (pediatric): Secondary | ICD-10-CM | POA: Diagnosis not present

## 2016-05-15 DIAGNOSIS — Z95810 Presence of automatic (implantable) cardiac defibrillator: Secondary | ICD-10-CM

## 2016-05-15 DIAGNOSIS — Z9989 Dependence on other enabling machines and devices: Secondary | ICD-10-CM

## 2016-05-15 LAB — CUP PACEART INCLINIC DEVICE CHECK
Battery Remaining Longevity: 60 mo
Battery Voltage: 2.97 V
Brady Statistic AP VP Percent: 35.15 %
Brady Statistic AS VS Percent: 0.34 %
Brady Statistic RA Percent Paced: 35.19 %
Brady Statistic RV Percent Paced: 98.43 %
Date Time Interrogation Session: 20170929145913
HighPow Impedance: 61 Ohm
Implantable Lead Implant Date: 20150313
Implantable Lead Implant Date: 20150313
Implantable Lead Location: 753859
Implantable Lead Model: 4598
Implantable Lead Model: 5076
Implantable Lead Model: 6935
Lead Channel Impedance Value: 399 Ohm
Lead Channel Impedance Value: 418 Ohm
Lead Channel Impedance Value: 418 Ohm
Lead Channel Impedance Value: 475 Ohm
Lead Channel Impedance Value: 513 Ohm
Lead Channel Impedance Value: 836 Ohm
Lead Channel Impedance Value: 836 Ohm
Lead Channel Pacing Threshold Amplitude: 1 V
Lead Channel Pacing Threshold Pulse Width: 0.4 ms
Lead Channel Pacing Threshold Pulse Width: 0.4 ms
Lead Channel Sensing Intrinsic Amplitude: 2 mV
Lead Channel Setting Pacing Amplitude: 2 V
Lead Channel Setting Pacing Pulse Width: 0.4 ms
MDC IDC LEAD IMPLANT DT: 20150313
MDC IDC LEAD LOCATION: 753858
MDC IDC LEAD LOCATION: 753860
MDC IDC MSMT LEADCHNL LV IMPEDANCE VALUE: 551 Ohm
MDC IDC MSMT LEADCHNL LV IMPEDANCE VALUE: 608 Ohm
MDC IDC MSMT LEADCHNL LV IMPEDANCE VALUE: 779 Ohm
MDC IDC MSMT LEADCHNL LV IMPEDANCE VALUE: 874 Ohm
MDC IDC MSMT LEADCHNL LV IMPEDANCE VALUE: 931 Ohm
MDC IDC MSMT LEADCHNL LV PACING THRESHOLD AMPLITUDE: 0.75 V
MDC IDC MSMT LEADCHNL RA PACING THRESHOLD AMPLITUDE: 1.25 V
MDC IDC MSMT LEADCHNL RA PACING THRESHOLD PULSEWIDTH: 0.4 ms
MDC IDC MSMT LEADCHNL RV IMPEDANCE VALUE: 285 Ohm
MDC IDC MSMT LEADCHNL RV SENSING INTR AMPL: 9.5 mV
MDC IDC SET LEADCHNL RA PACING AMPLITUDE: 2.25 V
MDC IDC SET LEADCHNL RV PACING AMPLITUDE: 2.5 V
MDC IDC SET LEADCHNL RV PACING PULSEWIDTH: 0.4 ms
MDC IDC SET LEADCHNL RV SENSING SENSITIVITY: 0.3 mV
MDC IDC STAT BRADY AP VS PERCENT: 0.04 %
MDC IDC STAT BRADY AS VP PERCENT: 64.48 %

## 2016-05-15 MED ORDER — LOSARTAN POTASSIUM 25 MG PO TABS: 25.0000 mg | ORAL_TABLET | Freq: Every day | ORAL | 6 refills | Status: DC

## 2016-05-15 NOTE — Progress Notes (Signed)
Electrophysiology Office Note   Date:  05/15/2016   ID:  [name], DOB 1961-07-18, MRN [id number]  PCP:  [name], MD  Cardiologist:  [name] Primary Electrophysiologist: Thompson Grayer, MD    CC: SOB   History of Present Illness: [name] is a 55 y.o. male who presents today for electrophysiology evaluation.  He continues to have SOB.  Device optimization recently was not helpful and thus he was returned to prior settings. Today, he denies symptoms of palpitations, chest pain  orthopnea, PND, claudication, dizziness, presyncope, syncope, bleeding, or neurologic sequela. The patient is tolerating medications without difficulties and is otherwise without complaint today.    Past Medical History:  Diagnosis Date  . Atrial flutter ([place])  10/01/2006, 12/30/11   Status post RF ablation, 7/13, Dr. [name]  . Chronic systolic dysfunction of left ventricle    EF 20-25%, global HK; mild RVD, 2-D echo, 5/13  . Hypertension   . Morbid obesity ([place])   . Nonischemic cardiomyopathy ([place])    Normal coronary arteries, 2008  . OSA (obstructive sleep apnea)    compliant with CPAP  . Pulmonary hypertension (HCC)    RVSP 50-55 mmHg, 5/13  . RBBB, anterior fascicular block and incomplete LBBB   . Type II or unspecified type diabetes mellitus without mention of complication, not stated as uncontrolled   . Unspecified disorder of kidney and ureter   . Valvular heart disease    Mild MR/TR, 2-D echo, 5/13   Past Surgical History:  Procedure Laterality Date  . AMPUTATION Right 05/21/2015   Procedure: AMPUTATION BELOW KNEE;  Surgeon: Newt Minion, MD;  Location: [facility];  Service: Orthopedics;  Laterality: Right;  . ATRIAL FLUTTER ABLATION N/A 03/04/2012   Procedure: ATRIAL FLUTTER ABLATION;  Surgeon: Thompson Grayer, MD;  [facility] C[facility] LAB;  Service: Cardiovascular;  Laterality: N/A;  . BI-VENTRICULAR IMPLANTABLE CARDIOVERTER DEFIBRILLATOR N/A 10/27/2013   Procedure:  BI-VENTRICULAR IMPLANTABLE CARDIOVERTER DEFIBRILLATOR  (CRT-D);  Surgeon: Coralyn Mark, MD;  Location: [facility] CATH LAB;  Service: Cardiovascular;  Laterality: N/A;  . BI-VENTRICULAR IMPLANTABLE CARDIOVERTER DEFIBRILLATOR  (CRT-D)  10/27/2013   MDT [name] XT CRTD implanted by [name] for NICM, CHF  . CATARACT EXTRACTION W/PHACO Right 03/25/2015   Procedure: CATARACT EXTRACTION PHACO AND INTRAOCULAR LENS PLACEMENT RIGHT EYE CDE=12.24;  Surgeon: Tonny Branch, MD;  [facility];  Service: Ophthalmology;  Laterality: Right;  . CYSTOSCOPY/RETROGRADE/URETEROSCOPY Bilateral 10/16/2014   Procedure: CYSTO WITH BILATERAL Freddie Breech WITH GYRUS;  Surgeon: Malka So, MD;  [facility];  Service: Urology;  Laterality: Bilateral;  . TRANSURETHRAL RESECTION OF BLADDER TUMOR WITH GYRUS (TURBT-GYRUS) N/A 10/16/2014   Procedure: TRANSURETHRAL RESECTION OF BLADDER TUMOR WITH GYRUS (TURBT-GYRUS);  Surgeon: Malka So, MD;  [facility];  Service: Urology;  Laterality: N/A;     Current Outpatient Prescriptions  Medication Sig Dispense Refill  . allopurinol (ZYLOPRIM) 300 MG tablet Take 1 tablet (300 mg total) by mouth daily. 30 tablet 5  . atorvastatin (LIPITOR) 10 MG tablet Take 1 tablet (10 mg total) by mouth every morning. 30 tablet 1  . cloNIDine (CATAPRES) 0.2 MG tablet Take 1 tablet (0.2 mg total) by mouth 2 (two) times daily. 60 tablet 1  . COLCRYS 0.6 MG tablet Take 0.6 mg by mouth daily as needed (gout).     . dabigatran (PRADAXA) 150 MG CAPS capsule Take 1 capsule (150 mg total) by mouth 2 (two) times daily. 60 capsule 11  . digoxin (  LANOXIN) 0.125 MG tablet Take 1 tablet (0.125 mg total) by mouth daily. 30 tablet 1  . gemfibrozil (LOPID) 600 MG tablet Take 1 tablet (600 mg total) by mouth 2 (two) times daily before a meal. 60 tablet 1  . HYDROcodone-acetaminophen (NORCO/VICODIN) 5-325 MG tablet Take 1-2 tablets by mouth every 4 (four) hours as needed for moderate pain. 90 tablet 0  .  insulin detemir (LEVEMIR) 100 UNIT/ML injection Inject 0.5 mLs (50 Units total) into the skin at bedtime. 10 mL 11  . LITE TOUCH PEN NEEDLES 31G X 5 MM MISC USE AS DIRECTED UP TO 5 INJECTIONS DAILY  7  . metFORMIN (GLUCOPHAGE) 1000 MG tablet Take 1,000 mg by mouth 2 (two) times daily.  1  . methocarbamol (ROBAXIN) 500 MG tablet Take 1 tablet (500 mg total) by mouth every 6 (six) hours as needed for muscle spasms. 60 tablet 0  . metolazone (ZAROXOLYN) 5 MG tablet Take one tab by mouth daily x 5 days only, then only as directed per MD thereafter. 30 tablet 1  . metoprolol (LOPRESSOR) 50 MG tablet TAKE 1 AND 1/2 TABLETS BY MOUTH TWICE DAILY. 180 tablet 3  . Multiple Vitamin (MULTIVITAMIN WITH MINERALS) TABS tablet Take 1 tablet by mouth daily.    [name] NOVOLOG FLEXPEN 100 UNIT/ML FlexPen INJECT SUBCUTANEOUSLY 25 UNITS IN THE MORNING AND 25 AT NOON AND 35 UNITS AT DINNER  6  . prazosin (MINIPRESS) 1 MG capsule Take 1 capsule (1 mg total) by mouth 2 (two) times daily. 60 capsule 6  . spironolactone (ALDACTONE) 25 MG tablet Take 1 tablet (25 mg total) by mouth daily. 90 tablet 3  . torsemide (DEMADEX) 20 MG tablet TAKE TWO (2) TABLETS BY MOUTH TWICE DAILY. 120 tablet 11  . ULTICARE MINI PEN NEEDLES 31G X 6 MM MISC 1 each by Does not apply route daily.     [name] losartan (COZAAR) 25 MG tablet Take 1 tablet (25 mg total) by mouth daily. 30 tablet 6   No current facility-administered medications for this visit.     Allergies:   Review of patient's allergies indicates no known allergies.   Social History:  The patient  reports that he has never smoked. He has never used smokeless tobacco. He reports that he does not drink alcohol or use drugs.   Family History:  The patient's  family history includes Asthma (age of onset: 66) in his father; Heart attack in his mother.    ROS:  Please see the history of present illness.   All other systems are reviewed and negative.    PHYSICAL EXAM: VS:  BP (!) 145/82    Pulse 65   Ht 5\' 11"  (1.803 m)   Wt 260 lb 3.2 oz (118 kg)   SpO2 97%   BMI 36.29 kg/m  , BMI Body mass index is 36.29 kg/m. GEN: chronically ill, in no acute distress  HEENT: normal  Neck: no JVD, carotid bruits, or masses Cardiac: RRR; no murmurs, rubs, or gallops,+2 edema  Respiratory:  clear to auscultation bilaterally, normal work of breathing GI: soft, nontender, nondistended, + BS MS: R leg is in a boot Skin: warm and dry  Neuro:  Strength and sensation are intact Psych: euthymic mood, full affect   Recent Labs: 05/21/2015: Magnesium 1.8 05/24/2015: ALT 19 06/07/2015: Hemoglobin 9.1; Platelets 225 04/15/2016: BUN 49; Creat 1.29; Potassium 4.1; Sodium 136    Lipid Panel  No results found for: CHOL, TRIG, HDL, CHOLHDL, VLDL, LDLCALC, LDLDIRECT  Wt Readings from Last 3 Encounters:  05/15/16 260 lb 3.2 oz (118 kg)  04/15/16 253 lb 9.6 oz (115 kg)  03/06/16 251 lb (113.9 kg)     Other studies Reviewed: Additional studies/ records that were reviewed today include: Medical illustrator notes are reviewed, prior echo is reviewed   ASSESSMENT AND PLAN:   1.  chronic systolic dysfunction Stable on an appropriate medical regimen Normal BiV ICD function See Pace Art report Daily weights, 2 gram sodium restriction encouraged Losartan added today. Can be uptitrated upon follow-up with [name] bmet in 2 weeks  2. Atrial tachycardia/ atrial fibrillation Burden is <1% Continue current medicine strategy  3. Obesity Weight loss is advised  4. OSA He reports compliance with CPAP  [facility] Return to see me in 1 year   Current medicines are reviewed at length with the patient today.   The patient does not have concerns regarding his medicines.  The following changes were made today:  none  Signed, Thompson Grayer, MD  05/15/2016 1:43 PM     [place] [phone] (office) [phone] (fax)

## 2016-05-15 NOTE — Patient Instructions (Addendum)
Medication Instructions:   Begin Losartan 25mg  daily.  Continue all other medications.    Labwork:  BMET - due in 2 weeks, order given today.  Office will contact with results via phone or letter.    Testing/Procedures: none  Follow-Up: Your physician wants you to follow up in:  1 year.  You will receive a reminder letter in the mail one-two months in advance.  If you don't receive a letter, please call our office to schedule the follow up appointment   Any Other Special Instructions Will Be Listed Below (If Applicable). Follow up with Dr. Bronson Ing in 6-8 weeks.   If you need a refill on your cardiac medications before your next appointment, please call your pharmacy.

## 2016-05-19 ENCOUNTER — Ambulatory Visit (INDEPENDENT_AMBULATORY_CARE_PROVIDER_SITE_OTHER): Payer: Medicare Other

## 2016-05-19 DIAGNOSIS — I5023 Acute on chronic systolic (congestive) heart failure: Secondary | ICD-10-CM

## 2016-05-19 DIAGNOSIS — Z95810 Presence of automatic (implantable) cardiac defibrillator: Secondary | ICD-10-CM

## 2016-05-19 NOTE — Progress Notes (Signed)
EPIC Encounter for ICM Monitoring  Patient Name: [name] is a 55 y.o. male Date: 05/19/2016 Primary Care Physican: [name], MD Primary Cardiologist: Bronson Ing Electrophysiologist: Allred Dry Weight:    unknown   Bi-V Pacing:  99.2%       Attempted ICM call and unable to reach.  Transmission reviewed.   Thoracic impedance abnormal suggesting fluid accumulation.  Follow-up plan: ICM clinic phone appointment on 06/01/2016.  Copy of ICM check sent to primary cardiologist and device physician.   ICM trend: 05/19/2016             Rosalene Billings, RN 05/19/2016 12:07 PM

## 2016-05-20 ENCOUNTER — Telehealth: Payer: Self-pay

## 2016-05-20 NOTE — Progress Notes (Signed)
Once you're able to get a hold of him, check on symptoms. If noteworthy, would increase torsemide to 40 mg BID x 3 days.

## 2016-05-20 NOTE — Progress Notes (Signed)
Patient returned call.  He reported he has swelling at top of leg and in belly.  Advised of Dr Court Joy recommendation to increase to 40 mg bid x 3 days.  He verbalized understanding.  He reported his breathing has improved since [name] started him on an ACE inhibitor.  Next ICM remote transmission scheduled for 05/26/2016.

## 2016-05-20 NOTE — Telephone Encounter (Signed)
Remote ICM transmission received.  Attempted patient call and left message to return call.   

## 2016-05-26 ENCOUNTER — Ambulatory Visit (INDEPENDENT_AMBULATORY_CARE_PROVIDER_SITE_OTHER): Payer: Medicare Other

## 2016-05-26 ENCOUNTER — Telehealth: Payer: Self-pay

## 2016-05-26 DIAGNOSIS — Z95810 Presence of automatic (implantable) cardiac defibrillator: Secondary | ICD-10-CM

## 2016-05-26 DIAGNOSIS — I5023 Acute on chronic systolic (congestive) heart failure: Secondary | ICD-10-CM

## 2016-05-26 NOTE — Telephone Encounter (Signed)
Remote ICM transmission received.  Attempted patient call and left message to return call.   

## 2016-05-26 NOTE — Progress Notes (Signed)
EPIC Encounter for ICM Monitoring  Patient Name: [name] is a 55 y.o. male Date: 05/26/2016 Primary Care Physican: [name], MD Primary [place] Electrophysiologist: Allred Dry Weight:unknown   Bi-V Pacing: 99.4%               Attempted ICM call and unable to reach.  Transmission reviewed.   Thoracic impedance returned to normal after increase of Torsemide x 3 days on 05/19/2016.  Impedance starting to trend below baseline suggesting fluid may be re accumulating since 10/9.[name]   Follow-up plan: ICM clinic phone appointment on 07/02/2016.  Office visit with [name] on 07/03/2016  Copy of ICM check sent to primary cardiologist and device physician for updated transmission after Torsemide increase.   ICM trend: 05/26/2016       Carl Billings, RN 05/26/2016 11:37 AM

## 2016-07-02 ENCOUNTER — Telehealth: Payer: Self-pay

## 2016-07-02 ENCOUNTER — Ambulatory Visit (INDEPENDENT_AMBULATORY_CARE_PROVIDER_SITE_OTHER): Payer: Medicare Other

## 2016-07-02 ENCOUNTER — Encounter: Payer: Self-pay | Admitting: *Deleted

## 2016-07-02 DIAGNOSIS — I5023 Acute on chronic systolic (congestive) heart failure: Secondary | ICD-10-CM | POA: Diagnosis not present

## 2016-07-02 DIAGNOSIS — Z95810 Presence of automatic (implantable) cardiac defibrillator: Secondary | ICD-10-CM | POA: Diagnosis not present

## 2016-07-02 NOTE — Progress Notes (Signed)
EPIC Encounter for ICM Monitoring  Patient Name: [name] is a 55 y.o. male Date: 07/02/2016 Primary Care Physican: [name], MD Primary [place] Electrophysiologist: Allred Dry Weight:unknown  Bi-V Pacing: 99.3%      Attempted ICM call and unable to reach.  Transmission reviewed.   Thoracic impedance normal     Follow-up plan: ICM clinic phone appointment on 08/04/2016.  Office appointment with [name] on 07/03/2016.   Copy of ICM check sent to primary cardiologist and device physician.   ICM trend: 07/02/2016       Rosalene Billings, RN 07/02/2016 2:19 PM

## 2016-07-02 NOTE — Telephone Encounter (Signed)
Remote ICM transmission received.  Attempted patient call and no answer or answering machine.  

## 2016-07-03 ENCOUNTER — Encounter: Payer: Self-pay | Admitting: Cardiovascular Disease

## 2016-07-03 ENCOUNTER — Ambulatory Visit (INDEPENDENT_AMBULATORY_CARE_PROVIDER_SITE_OTHER): Payer: Medicare Other | Admitting: Cardiovascular Disease

## 2016-07-03 VITALS — BP 142/88 | HR 75 | Ht 72.0 in | Wt 270.0 lb

## 2016-07-03 DIAGNOSIS — I5022 Chronic systolic (congestive) heart failure: Secondary | ICD-10-CM

## 2016-07-03 DIAGNOSIS — Z95810 Presence of automatic (implantable) cardiac defibrillator: Secondary | ICD-10-CM | POA: Diagnosis not present

## 2016-07-03 DIAGNOSIS — I10 Essential (primary) hypertension: Secondary | ICD-10-CM | POA: Diagnosis not present

## 2016-07-03 DIAGNOSIS — I480 Paroxysmal atrial fibrillation: Secondary | ICD-10-CM

## 2016-07-03 NOTE — Patient Instructions (Signed)
Medication Instructions:  Continue all current medications.  Labwork: none  Testing/Procedures: none  Follow-Up: 4 months   Any Other Special Instructions Will Be Listed Below (If Applicable).  If you need a refill on your cardiac medications before your next appointment, please call your pharmacy.\ 

## 2016-07-03 NOTE — Progress Notes (Signed)
SUBJECTIVE: The patient returns for follow up for chronic systolic heart failure and has a CRT-D system in place.   Echocardiogram 02/26/16: LVEF 30-35%.  Thoracic impedance assessed on 07/02/16 was normal.  Denies shortness of breath and left leg swelling.  Review of Systems: As per "subjective", otherwise negative.  No Known Allergies  Current Outpatient Prescriptions  Medication Sig Dispense Refill  . allopurinol (ZYLOPRIM) 300 MG tablet Take 1 tablet (300 mg total) by mouth daily. 30 tablet 5  . atorvastatin (LIPITOR) 10 MG tablet Take 1 tablet (10 mg total) by mouth every morning. 30 tablet 1  . cloNIDine (CATAPRES) 0.2 MG tablet Take 1 tablet (0.2 mg total) by mouth 2 (two) times daily. 60 tablet 1  . COLCRYS 0.6 MG tablet Take 0.6 mg by mouth daily as needed (gout).     . dabigatran (PRADAXA) 150 MG CAPS capsule Take 1 capsule (150 mg total) by mouth 2 (two) times daily. 60 capsule 11  . digoxin (LANOXIN) 0.125 MG tablet Take 1 tablet (0.125 mg total) by mouth daily. 30 tablet 1  . gemfibrozil (LOPID) 600 MG tablet Take 1 tablet (600 mg total) by mouth 2 (two) times daily before a meal. 60 tablet 1  . HYDROcodone-acetaminophen (NORCO/VICODIN) 5-325 MG tablet Take 1-2 tablets by mouth every 4 (four) hours as needed for moderate pain. 90 tablet 0  . insulin detemir (LEVEMIR) 100 UNIT/ML injection Inject 0.5 mLs (50 Units total) into the skin at bedtime. 10 mL 11  . LITE TOUCH PEN NEEDLES 31G X 5 MM MISC USE AS DIRECTED UP TO 5 INJECTIONS DAILY  7  . losartan (COZAAR) 25 MG tablet Take 1 tablet (25 mg total) by mouth daily. 30 tablet 6  . metFORMIN (GLUCOPHAGE) 1000 MG tablet Take 1,000 mg by mouth 2 (two) times daily.  1  . methocarbamol (ROBAXIN) 500 MG tablet Take 1 tablet (500 mg total) by mouth every 6 (six) hours as needed for muscle spasms. 60 tablet 0  . metolazone (ZAROXOLYN) 5 MG tablet Take one tab by mouth daily x 5 days only, then only as directed per MD  thereafter. 30 tablet 1  . metoprolol (LOPRESSOR) 50 MG tablet TAKE 1 AND 1/2 TABLETS BY MOUTH TWICE DAILY. 180 tablet 3  . Multiple Vitamin (MULTIVITAMIN WITH MINERALS) TABS tablet Take 1 tablet by mouth daily.    [name] NOVOLOG FLEXPEN 100 UNIT/ML FlexPen INJECT SUBCUTANEOUSLY 25 UNITS IN THE MORNING AND 25 AT NOON AND 35 UNITS AT DINNER  6  . prazosin (MINIPRESS) 1 MG capsule Take 1 capsule (1 mg total) by mouth 2 (two) times daily. 60 capsule 6  . spironolactone (ALDACTONE) 25 MG tablet Take 1 tablet (25 mg total) by mouth daily. 90 tablet 3  . torsemide (DEMADEX) 20 MG tablet TAKE TWO (2) TABLETS BY MOUTH TWICE DAILY. 120 tablet 11  . ULTICARE MINI PEN NEEDLES 31G X 6 MM MISC 1 each by Does not apply route daily.      No current facility-administered medications for this visit.     Past Medical History:  Diagnosis Date  . Atrial flutter ([place])  10/01/2006, 12/30/11   Status post RF ablation, 7/13, Dr. [name]  . Chronic systolic dysfunction of left ventricle    EF 20-25%, global HK; mild RVD, 2-D echo, 5/13  . Hypertension   . Morbid obesity ([place])   . Nonischemic cardiomyopathy ([place])    Normal coronary arteries, 2008  . OSA (obstructive sleep apnea)  compliant with CPAP  . Pulmonary hypertension    RVSP 50-55 mmHg, 5/13  . RBBB, anterior fascicular block and incomplete LBBB   . Type II or unspecified type diabetes mellitus without mention of complication, not stated as uncontrolled   . Unspecified disorder of kidney and ureter   . Valvular heart disease    Mild MR/TR, 2-D echo, 5/13    Past Surgical History:  Procedure Laterality Date  . AMPUTATION Right 05/21/2015   Procedure: AMPUTATION BELOW KNEE;  Surgeon: Newt Minion, MD;  Location: [facility];  Service: Orthopedics;  Laterality: Right;  . ATRIAL FLUTTER ABLATION N/A 03/04/2012   Procedure: ATRIAL FLUTTER ABLATION;  Surgeon: Thompson Grayer, MD;  Location: [facility] CATH LAB;  Service: Cardiovascular;  Laterality: N/A;  . BI-VENTRICULAR  IMPLANTABLE CARDIOVERTER DEFIBRILLATOR N/A 10/27/2013   Procedure: BI-VENTRICULAR IMPLANTABLE CARDIOVERTER DEFIBRILLATOR  (CRT-D);  Surgeon: Coralyn Mark, MD;  Location: [facility] CATH LAB;  Service: Cardiovascular;  Laterality: N/A;  . BI-VENTRICULAR IMPLANTABLE CARDIOVERTER DEFIBRILLATOR  (CRT-D)  10/27/2013   MDT [name] XT CRTD implanted by [name] for NICM, CHF  . CATARACT EXTRACTION W/PHACO Right 03/25/2015   Procedure: CATARACT EXTRACTION PHACO AND INTRAOCULAR LENS PLACEMENT RIGHT EYE CDE=12.24;  Surgeon: Tonny Branch, MD;  Location: [facility];  Service: Ophthalmology;  Laterality: Right;  . CYSTOSCOPY/RETROGRADE/URETEROSCOPY Bilateral 10/16/2014   Procedure: CYSTO WITH BILATERAL Freddie Breech WITH GYRUS;  Surgeon: Malka So, MD;  Location: [facility];  Service: Urology;  Laterality: Bilateral;  . TRANSURETHRAL RESECTION OF BLADDER TUMOR WITH GYRUS (TURBT-GYRUS) N/A 10/16/2014   Procedure: TRANSURETHRAL RESECTION OF BLADDER TUMOR WITH GYRUS (TURBT-GYRUS);  Surgeon: Malka So, MD;  Location: [facility];  Service: Urology;  Laterality: N/A;    Social History   Social History  . Marital status: Single    Spouse name: N/A  . Number of children: N/A  . Years of education: N/A   Occupational History  . DISABLED    Social History Main Topics  . Smoking status: Never Smoker  . Smokeless tobacco: Never Used  . Alcohol use No  . Drug use: No  . Sexual activity: No   Other Topics Concern  . Not on file   Social History Narrative   Pt lives in [place] alone.  Disabled.  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     Vitals:   07/03/16 1534  BP: (!) 142/88  Pulse: 75  SpO2: 98%  Weight: 270 lb (122.5 kg)  Height: 6' (1.829 m)    PHYSICAL EXAM General: NAD HEENT: Normal. Neck: No JVD, no thyromegaly. Lungs: Clear. CV: Regular rate and rhythm, normal [id number] S2, 1/6 systolic murmur along left sternal border and over RUSB. Right leg BKA. Left leg without edema. Abdomen: Soft,  nontender, obese, no distention.  Neurologic: Alert and oriented x 3.  Psych: Normal affect. Skin: Stasis dermatitis of left leg. Musculoskeletal: Right BKA.    ECG: Most recent ECG reviewed.      ASSESSMENT AND PLAN: 1. Chronic systolic heart failure/nonischemic cardiomyopathy s/p CRTD, EF 35%: Euvolemic. No changes.  2. Essential HTN: Elevated in office but normally 130/70's at home. Continue losartan at present dose.  3. Atrial fibrillation: HR controlled and CRT-D in place. Continue Pradaxa. No bleeding problems.  Dispo: f/u 4 months.   Kate Sable, M.D., F.A.C.C.

## 2016-08-04 ENCOUNTER — Telehealth: Payer: Self-pay

## 2016-08-04 ENCOUNTER — Ambulatory Visit (INDEPENDENT_AMBULATORY_CARE_PROVIDER_SITE_OTHER): Payer: Medicare Other

## 2016-08-04 DIAGNOSIS — I5022 Chronic systolic (congestive) heart failure: Secondary | ICD-10-CM

## 2016-08-04 DIAGNOSIS — Z95810 Presence of automatic (implantable) cardiac defibrillator: Secondary | ICD-10-CM | POA: Diagnosis not present

## 2016-08-04 NOTE — Progress Notes (Signed)
EPIC Encounter for ICM Monitoring  Patient Name: [name] is a 55 y.o. male Date: 08/04/2016 Primary Care Physican: [name], MD Primary [place] Electrophysiologist: Allred Dry Weight:unknown  Bi-V Pacing: 99%                                                       Attempted ICM call and unable to reach.  Left detailed message regarding transmission.  Transmission reviewed.   Thoracic impedance almost baseline as of today.  Was abnormal suggesting fluid accumulation around Thanksgiving holiday.  Recommendations:  Left message with ICM direct phone number and encouraged to call for fluid symptoms.    Follow-up plan: ICM clinic phone appointment on 09/07/2016.  Copy of ICM check sent to device physician.   ICM trend: 08/04/2016       Rosalene Billings, RN 08/04/2016 8:20 AM

## 2016-08-04 NOTE — Telephone Encounter (Signed)
Remote ICM transmission received.  Attempted patient call and left detailed message regarding transmission and next ICM scheduled for 09/04/2016.  Advised to return call for any fluid symptoms or questions.    

## 2016-08-25 ENCOUNTER — Other Ambulatory Visit: Payer: Self-pay | Admitting: Cardiovascular Disease

## 2016-09-07 ENCOUNTER — Ambulatory Visit (INDEPENDENT_AMBULATORY_CARE_PROVIDER_SITE_OTHER): Payer: Medicare Other | Admitting: *Deleted

## 2016-09-07 DIAGNOSIS — I5022 Chronic systolic (congestive) heart failure: Secondary | ICD-10-CM | POA: Diagnosis not present

## 2016-09-07 DIAGNOSIS — I428 Other cardiomyopathies: Secondary | ICD-10-CM

## 2016-09-07 DIAGNOSIS — Z95810 Presence of automatic (implantable) cardiac defibrillator: Secondary | ICD-10-CM

## 2016-09-07 MED ORDER — METOLAZONE 5 MG PO TABS: ORAL_TABLET | ORAL | 0 refills | Status: DC

## 2016-09-07 NOTE — Progress Notes (Signed)
Remote ICD transmission.   

## 2016-09-07 NOTE — Progress Notes (Signed)
Call to patient and provided Dr Court Joy recommendation of metolazone 5 mg daily x 5 days. Check BMET in 3 days.  He said he will have labs drawn at [facility] on 09/10/2016.  Advise will fax the order tomorrow morning and if any problems this week to call ICM number or Dr Court Joy office.  He verbalized understanding.  Next ICM transmission 09/11/2016.

## 2016-09-07 NOTE — Addendum Note (Signed)
Addended by: Rosalene Billings on: 09/07/2016 05:17 PM   Modules accepted: Orders

## 2016-09-07 NOTE — Progress Notes (Signed)
EPIC Encounter for ICM Monitoring  Patient Name: [name] is a 55 y.o. male Date: 09/07/2016 Primary Care Physican: SHAH,ASHISH, MD Primary Cardiologist:Koneswaran Electrophysiologist: Allred Dry Weight:unknown  Bi-V Pacing: 98%      Heart Failure questions reviewed, pt symptomatic with large amount of swelling in abdomen, thighs and estimates he has gained 20 lbs since beginning of January.   Thoracic impedance abnormal suggesting fluid accumulation since approximately 08/25/2016.   He confirms he takes Torsemide 20 mg 2 tablets (40 mg total) bid.  He reported he does not have any Metolazone pills and no refills ordered on last prescription.   Labs: 05/29/2016 Creatinine 1.30, BUN 51, Potassium 4.2, Sodium 138, EGFR 57->60 04/16/2016 Creatinine 1.29, BUN 49, Potassium 4.1, Sodium 136  01/22/2016 Creatinine 1.34, BUN 37, Potassium 4.3, Sodium 136, EGFR 55- >60  09/17/2015 Creatinine 0.92, BUN 25, Potassium 4.7, Sodium 137, EGFR >60   Recommendations: Copy of ICM check sent to Dr Koneswaran and Dr Allred for recommendations.      Follow-up plan: ICM clinic phone appointment on 09/11/2016 to recheck fluid levels.    3 month ICM trend: 09/07/2016   1 Year ICM trend:      Laurie S Short, RN 09/07/2016 3:35 PM     

## 2016-09-07 NOTE — Progress Notes (Signed)
Have him take metolazone 5 mg daily x 5 days. Check BMET in 3 days.

## 2016-09-08 LAB — CUP PACEART REMOTE DEVICE CHECK
Battery Remaining Longevity: 55 mo
Brady Statistic AP VP Percent: 52.5 %
Brady Statistic AP VS Percent: 0.13 %
Brady Statistic AS VP Percent: 46.62 %
Brady Statistic AS VS Percent: 0.75 %
Brady Statistic RA Percent Paced: 52.35 %
Brady Statistic RV Percent Paced: 98.33 %
HighPow Impedance: 62 Ohm
Implantable Lead Implant Date: 20150313
Implantable Lead Implant Date: 20150313
Implantable Lead Location: 753860
Implantable Lead Model: 5076
Lead Channel Impedance Value: 342 Ohm
Lead Channel Impedance Value: 361 Ohm
Lead Channel Impedance Value: 399 Ohm
Lead Channel Impedance Value: 418 Ohm
Lead Channel Impedance Value: 475 Ohm
Lead Channel Impedance Value: 513 Ohm
Lead Channel Impedance Value: 608 Ohm
Lead Channel Pacing Threshold Amplitude: 1.125 V
Lead Channel Pacing Threshold Pulse Width: 0.4 ms
Lead Channel Pacing Threshold Pulse Width: 0.4 ms
Lead Channel Sensing Intrinsic Amplitude: 1.625 mV
Lead Channel Sensing Intrinsic Amplitude: 1.625 mV
Lead Channel Sensing Intrinsic Amplitude: 8.875 mV
Lead Channel Setting Pacing Amplitude: 2.25 V
Lead Channel Setting Pacing Amplitude: 2.25 V
Lead Channel Setting Pacing Pulse Width: 0.4 ms
MDC IDC LEAD IMPLANT DT: 20150313
MDC IDC LEAD LOCATION: 753858
MDC IDC LEAD LOCATION: 753859
MDC IDC MSMT BATTERY VOLTAGE: 2.97 V
MDC IDC MSMT LEADCHNL LV IMPEDANCE VALUE: 361 Ohm
MDC IDC MSMT LEADCHNL LV IMPEDANCE VALUE: 608 Ohm
MDC IDC MSMT LEADCHNL LV IMPEDANCE VALUE: 665 Ohm
MDC IDC MSMT LEADCHNL LV IMPEDANCE VALUE: 665 Ohm
MDC IDC MSMT LEADCHNL LV IMPEDANCE VALUE: 722 Ohm
MDC IDC MSMT LEADCHNL LV PACING THRESHOLD PULSEWIDTH: 0.4 ms
MDC IDC MSMT LEADCHNL RA PACING THRESHOLD AMPLITUDE: 1.125 V
MDC IDC MSMT LEADCHNL RV IMPEDANCE VALUE: 285 Ohm
MDC IDC MSMT LEADCHNL RV PACING THRESHOLD AMPLITUDE: 0.875 V
MDC IDC MSMT LEADCHNL RV SENSING INTR AMPL: 8.875 mV
MDC IDC PG IMPLANT DT: 20150313
MDC IDC SESS DTM: 20180122051704
MDC IDC SET LEADCHNL LV PACING PULSEWIDTH: 0.4 ms
MDC IDC SET LEADCHNL RV PACING AMPLITUDE: 2.5 V
MDC IDC SET LEADCHNL RV SENSING SENSITIVITY: 0.3 mV

## 2016-09-09 ENCOUNTER — Encounter: Payer: Self-pay | Admitting: Cardiology

## 2016-09-11 ENCOUNTER — Ambulatory Visit (INDEPENDENT_AMBULATORY_CARE_PROVIDER_SITE_OTHER): Payer: Medicare Other

## 2016-09-11 DIAGNOSIS — Z95810 Presence of automatic (implantable) cardiac defibrillator: Secondary | ICD-10-CM

## 2016-09-11 DIAGNOSIS — I5022 Chronic systolic (congestive) heart failure: Secondary | ICD-10-CM

## 2016-09-11 NOTE — Progress Notes (Signed)
EPIC Encounter for ICM Monitoring  Patient Name: [name] is a 56 y.o. male Date: 09/11/2016 Primary Care Physican: [name], MD Primary [place] Electrophysiologist: Allred Dry Weight:unknown  Bi-V Pacing: 98.1%                                       Attempted call to patient and unable to reach.  Leftdetailed message regarding transmission.  Transmission reviewed.   Thoracic impedance improved after taking Metolazone x 3 days as prescribed but is still just below baseline.   Labs: 05/29/2016 Creatinine 1.30, BUN 51, Potassium 4.2, Sodium 138, EGFR 57->60 04/16/2016 Creatinine 1.29, BUN 49, Potassium 4.1, Sodium 136  01/22/2016 Creatinine 1.34, BUN 37, Potassium 4.3, Sodium 136, EGFR 55- >60  09/17/2015 Creatinine 0.92, BUN 25, Potassium 4.7, Sodium 137, EGFR >60   Recommendations: Provided ICM number and encouraged to call for fluid symptoms.  Follow-up plan: ICM clinic phone appointment on 09/29/2016 to recheck fluid levels.  Copy of ICM check sent to primary cardiologist and device physician.   3 month ICM trend: 09/11/2016   1 Year ICM trend:      Rosalene Billings, RN 09/11/2016 7:49 AM

## 2016-09-29 ENCOUNTER — Ambulatory Visit (INDEPENDENT_AMBULATORY_CARE_PROVIDER_SITE_OTHER): Payer: Medicare Other

## 2016-09-29 DIAGNOSIS — I5022 Chronic systolic (congestive) heart failure: Secondary | ICD-10-CM

## 2016-09-29 DIAGNOSIS — Z95810 Presence of automatic (implantable) cardiac defibrillator: Secondary | ICD-10-CM

## 2016-09-29 NOTE — Progress Notes (Signed)
EPIC Encounter for ICM Monitoring  Patient Name: [name] is a 56 y.o. male Date: 09/29/2016 Primary Care Physican: [name], MD Primary [place] Electrophysiologist: Allred Dry Weight:unknown  Bi-V Pacing: 98.5%       Heart Failure questions reviewed, pt asymptomatic    Thoracic impedance normal   Recommendations: No changes.  Encouraged to call for fluid symptoms.  Follow-up plan: ICM clinic phone appointment on 11/03/2016.  Office visit with [name] 11/04/2016.  Copy of ICM check sent to device physician.   3 month ICM trend: 09/29/2016   1 Year ICM trend:      Rosalene Billings, RN 09/29/2016 8:55 AM

## 2016-11-03 ENCOUNTER — Encounter: Payer: Self-pay | Admitting: *Deleted

## 2016-11-03 ENCOUNTER — Ambulatory Visit (INDEPENDENT_AMBULATORY_CARE_PROVIDER_SITE_OTHER): Payer: Medicare Other

## 2016-11-03 DIAGNOSIS — Z95810 Presence of automatic (implantable) cardiac defibrillator: Secondary | ICD-10-CM | POA: Diagnosis not present

## 2016-11-03 DIAGNOSIS — I5022 Chronic systolic (congestive) heart failure: Secondary | ICD-10-CM

## 2016-11-03 NOTE — Progress Notes (Signed)
EPIC Encounter for ICM Monitoring  Patient Name: [name] is a 56 y.o. male Date: 11/03/2016 Primary Care Physican: [name], MD Primary [place] Electrophysiologist: Allred Dry Weight:unknown  Bi-V Pacing: 98.4%      Heart Failure questions reviewed, pt asymptomatic.   Thoracic impedance normal.  Prescribed and confirmed dosage: Torsemide 20 mg 2 tablets (40 mg total) twice a day.  Metolazone 5 mg as directed  Labs: 10/13/2017Creatinine 1.30, BUN 51, Potassium 4.2, Sodium 138, EGFR 57->60 04/16/2016 Creatinine 1.29, BUN 49, Potassium 4.1, Sodium 136  01/22/2016 Creatinine 1.34, BUN 37, Potassium 4.3, Sodium 136, EGFR 55- >60  09/17/2015 Creatinine 0.92, BUN 25, Potassium 4.7, Sodium 137, EGFR >60   Recommendations: No changes. Reminded to limit dietary salt intake to 2000 mg/day and fluid intake to < 2 liters/day. Encouraged to call for fluid symptoms.  Follow-up plan: ICM clinic phone appointment on 12/07/2016.  Office appointment with [name] 11/04/2016  Copy of ICM check sent to primary cardiologist and device physician.   3 month ICM trend: 11/03/2016   1 Year ICM trend:      Rosalene Billings, RN 11/03/2016 9:18 AM

## 2016-11-04 ENCOUNTER — Ambulatory Visit: Payer: Medicare Other | Admitting: Cardiovascular Disease

## 2016-11-06 ENCOUNTER — Other Ambulatory Visit: Payer: Self-pay | Admitting: Internal Medicine

## 2016-11-11 ENCOUNTER — Encounter: Payer: Self-pay | Admitting: *Deleted

## 2016-11-12 ENCOUNTER — Encounter: Payer: Self-pay | Admitting: Cardiovascular Disease

## 2016-11-12 ENCOUNTER — Ambulatory Visit (INDEPENDENT_AMBULATORY_CARE_PROVIDER_SITE_OTHER): Payer: Medicare Other | Admitting: Cardiovascular Disease

## 2016-11-12 ENCOUNTER — Telehealth: Payer: Self-pay

## 2016-11-12 VITALS — BP 132/71 | HR 64 | Ht 71.0 in | Wt 294.0 lb

## 2016-11-12 DIAGNOSIS — I10 Essential (primary) hypertension: Secondary | ICD-10-CM

## 2016-11-12 DIAGNOSIS — I5023 Acute on chronic systolic (congestive) heart failure: Secondary | ICD-10-CM

## 2016-11-12 DIAGNOSIS — I480 Paroxysmal atrial fibrillation: Secondary | ICD-10-CM

## 2016-11-12 DIAGNOSIS — Z95810 Presence of automatic (implantable) cardiac defibrillator: Secondary | ICD-10-CM | POA: Diagnosis not present

## 2016-11-12 NOTE — Telephone Encounter (Signed)
ICM call to patient.  Left detailed message to send remote transmission on 11/16/2016 per Dr Raylene Everts request due to patient has gained 24 pounds and retaining fluid in stomach area.

## 2016-11-12 NOTE — Patient Instructions (Signed)
Medication Instructions:   Take your Metolazone 5mg  daily x 3 days only, then only as directed.   Continue all other medications.    Labwork: none  Testing/Procedures: none  Follow-Up: 6 weeks   Any Other Special Instructions Will Be Listed Below (If Applicable). Please weigh daily over the next 3 days & notify office.    If you need a refill on your cardiac medications before your next appointment, please call your pharmacy.

## 2016-11-12 NOTE — Progress Notes (Signed)
SUBJECTIVE: The patient presents for follow-up of chronic systolic heart failure and atrial fibrillation. I reviewed heart failure records. Thoracic impedance was normal on 11/03/16. He takes torsemide 20 mg twice daily and metolazone as needed.  Echocardiogram July 2017, LVEF 30-35%.  He says he become slightly short of breath after walking 20-30 feet. He has some occasional dizziness. This may last for 1 minute.  He does not weigh himself at home as he is scared to do so.  Weight today is 294 pounds and had been 270 pounds in November 2017.   Review of Systems: As per "subjective", otherwise negative.  No Known Allergies  Current Outpatient Prescriptions  Medication Sig Dispense Refill  . allopurinol (ZYLOPRIM) 300 MG tablet Take 1 tablet (300 mg total) by mouth daily. 30 tablet 5  . atorvastatin (LIPITOR) 10 MG tablet Take 1 tablet (10 mg total) by mouth every morning. 30 tablet 1  . cloNIDine (CATAPRES) 0.2 MG tablet Take 1 tablet (0.2 mg total) by mouth 2 (two) times daily. 60 tablet 1  . COLCRYS 0.6 MG tablet Take 0.6 mg by mouth daily as needed (gout).     . dabigatran (PRADAXA) 150 MG CAPS capsule Take 1 capsule (150 mg total) by mouth 2 (two) times daily. 60 capsule 11  . digoxin (LANOXIN) 0.125 MG tablet Take 1 tablet (0.125 mg total) by mouth daily. 30 tablet 1  . gemfibrozil (LOPID) 600 MG tablet Take 1 tablet (600 mg total) by mouth 2 (two) times daily before a meal. 60 tablet 1  . HYDROcodone-acetaminophen (NORCO/VICODIN) 5-325 MG tablet Take 1-2 tablets by mouth every 4 (four) hours as needed for moderate pain. 90 tablet 0  . insulin detemir (LEVEMIR) 100 UNIT/ML injection Inject 0.5 mLs (50 Units total) into the skin at bedtime. 10 mL 11  . LITE TOUCH PEN NEEDLES 31G X 5 MM MISC USE AS DIRECTED UP TO 5 INJECTIONS DAILY  7  . losartan (COZAAR) 25 MG tablet TAKE ONE TABLET BY MOUTH DAILY 30 tablet 0  . metFORMIN (GLUCOPHAGE) 1000 MG tablet Take 1,000 mg by mouth 2  (two) times daily.  1  . methocarbamol (ROBAXIN) 500 MG tablet Take 1 tablet (500 mg total) by mouth every 6 (six) hours as needed for muscle spasms. 60 tablet 0  . metolazone (ZAROXOLYN) 5 MG tablet Take one tab by mouth daily x 5 days only, then only as directed per MD thereafter. 30 tablet 0  . metoprolol (LOPRESSOR) 50 MG tablet TAKE 1 AND 1/2 TABLETS BY MOUTH TWICE DAILY. 180 tablet 3  . Multiple Vitamin (MULTIVITAMIN WITH MINERALS) TABS tablet Take 1 tablet by mouth daily.    [name] NOVOLOG FLEXPEN 100 UNIT/ML FlexPen INJECT SUBCUTANEOUSLY 25 UNITS IN THE MORNING AND 25 AT NOON AND 35 UNITS AT DINNER  6  . prazosin (MINIPRESS) 1 MG capsule TAKE ONE CAPSULE BY MOUTH TWICE DAILY. 60 capsule 6  . spironolactone (ALDACTONE) 25 MG tablet Take 1 tablet (25 mg total) by mouth daily. 90 tablet 3  . torsemide (DEMADEX) 20 MG tablet TAKE TWO (2) TABLETS BY MOUTH TWICE DAILY. 120 tablet 11  . ULTICARE MINI PEN NEEDLES 31G X 6 MM MISC 1 each by Does not apply route daily.      No current facility-administered medications for this visit.     Past Medical History:  Diagnosis Date  . Atrial flutter ([place])  10/01/2006, 12/30/11   Status post RF ablation, 7/13, Dr. [name]  . Chronic  systolic dysfunction of left ventricle    EF 20-25%, global HK; mild RVD, 2-D echo, 5/13  . Hypertension   . Morbid obesity ([place])   . Nonischemic cardiomyopathy ([place])    Normal coronary arteries, 2008  . OSA (obstructive sleep apnea)    compliant with CPAP  . Pulmonary hypertension    RVSP 50-55 mmHg, 5/13  . RBBB, anterior fascicular block and incomplete LBBB   . Type II or unspecified type diabetes mellitus without mention of complication, not stated as uncontrolled   . Unspecified disorder of kidney and ureter   . Valvular heart disease    Mild MR/TR, 2-D echo, 5/13    Past Surgical History:  Procedure Laterality Date  . AMPUTATION Right 05/21/2015   Procedure: AMPUTATION BELOW KNEE;  Surgeon: Newt Minion, MD;   Location: [place];  Service: Orthopedics;  Laterality: Right;  . ATRIAL FLUTTER ABLATION N/A 03/04/2012   Procedure: ATRIAL FLUTTER ABLATION;  Surgeon: Thompson Grayer, MD;  Location: [facility] CATH LAB;  Service: Cardiovascular;  Laterality: N/A;  . BI-VENTRICULAR IMPLANTABLE CARDIOVERTER DEFIBRILLATOR N/A 10/27/2013   Procedure: BI-VENTRICULAR IMPLANTABLE CARDIOVERTER DEFIBRILLATOR  (CRT-D);  Surgeon: Coralyn Mark, MD;  Location: [facility] CATH LAB;  Service: Cardiovascular;  Laterality: N/A;  . BI-VENTRICULAR IMPLANTABLE CARDIOVERTER DEFIBRILLATOR  (CRT-D)  10/27/2013   MDT [name] XT CRTD implanted by [name] for NICM, CHF  . CATARACT EXTRACTION W/PHACO Right 03/25/2015   Procedure: CATARACT EXTRACTION PHACO AND INTRAOCULAR LENS PLACEMENT RIGHT EYE CDE=12.24;  Surgeon: Tonny Branch, MD;  Location: [facility];  Service: Ophthalmology;  Laterality: Right;  . CYSTOSCOPY/RETROGRADE/URETEROSCOPY Bilateral 10/16/2014   Procedure: CYSTO WITH BILATERAL Freddie Breech WITH GYRUS;  Surgeon: Malka So, MD;  Location[facility];  Service: Urology;  Laterality: Bilateral;  . TRANSURETHRAL RESECTION OF BLADDER TUMOR WITH GYRUS (TURBT-GYRUS) N/A 10/16/2014   Procedure: TRANSURETHRAL RESECTION OF BLADDER TUMOR WITH GYRUS (TURBT-GYRUS);  Surgeon: Malka So, MD;  Location: [facility];  Service: Urology;  Laterality: N/A;    Social History   Social History  . Marital status: Single    Spouse name: N/A  . Number of children: N/A  . Years of education: N/A   Occupational History  . DISABLED    Social History Main Topics  . Smoking status: Never Smoker  . Smokeless tobacco: Never Used  . Alcohol use No  . Drug use: No  . Sexual activity: No   Other Topics Concern  . Not on file   Social History Narrative   Pt lives in [place] alone.  Disabled.  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     Vitals:   11/12/16 1351  BP: 132/71  Pulse: 64  SpO2: 97%  Weight: 294 lb (133.4 kg)  Height: 5\' 11"  (1.803 m)     PHYSICAL EXAM  General: NAD HEENT: Normal. Neck: No JVD, no thyromegaly. Lungs: Clear. CV: Regular rate and rhythm, normal J6/RCVEL S2, 1/6 systolic murmur along left sternal border and over RUSB. Right leg BKA. Left leg with trace edema and erythema. Abdomen: Firm, distended. Neurologic: Alert and oriented x 3.  Psych: Normal affect. Skin: Stasis dermatitis of left leg with chronic erythema. Musculoskeletal: Right BKA.    ECG: Most recent ECG reviewed.      ASSESSMENT AND PLAN: 1. Acute on chronic systolic heart failure/nonischemic cardiomyopathy s/p CRTD, EF 35%: His abdomen is significantly distended and his weight is up 24 pounds. I will have him take metolazone 5 mg daily for the  next 3 days and ask our heart failure surveillance nurse to contact him this Monday. Continue torsemide 20 mg twice daily and spironolactone 25 mg. I asked him to weigh himself daily.  2. Essential HTN: Controlled on present therapy. Continue losartan 25 mg and spironolactone 25 mg.  3. Atrial fibrillation: Symptomatically stable. Continue Pradaxa, digoxin, and metoprolol.  4. CRT-D: Functioning normally.   Dispo: f/u 6 weeks  Kate Sable, M.D., F.A.C.C.

## 2016-11-16 ENCOUNTER — Telehealth: Payer: Self-pay | Admitting: Cardiology

## 2016-11-16 ENCOUNTER — Ambulatory Visit (INDEPENDENT_AMBULATORY_CARE_PROVIDER_SITE_OTHER): Payer: Self-pay

## 2016-11-16 DIAGNOSIS — I5022 Chronic systolic (congestive) heart failure: Secondary | ICD-10-CM

## 2016-11-16 DIAGNOSIS — Z95810 Presence of automatic (implantable) cardiac defibrillator: Secondary | ICD-10-CM

## 2016-11-16 NOTE — Telephone Encounter (Signed)
LMOVM reminding pt to send remote transmission.   

## 2016-11-17 ENCOUNTER — Telehealth: Payer: Self-pay

## 2016-11-17 ENCOUNTER — Telehealth: Payer: Self-pay | Admitting: *Deleted

## 2016-11-17 DIAGNOSIS — I10 Essential (primary) hypertension: Secondary | ICD-10-CM

## 2016-11-17 NOTE — Progress Notes (Signed)
EPIC Encounter for ICM Monitoring  Patient Name: [name] is a 56 y.o. male Date: 11/17/2016 Primary Care Physican: SHAH,ASHISH, MD Primary Cardiologist:Koneswaran Electrophysiologist: Allred Dry Weight:294 lbs  Bi-V Pacing: 98.3%      Heart Failure questions reviewed, pt remains swollen in abdomen area and weight remains unchanged after taking Metolazone x 3 days.   He reported the Metolazone did make him urinate a lot but he is not sure why it did not improve.     Thoracic impedance has improved but still trending under baseline suggesting fluid accumulation after taking Metolazone 5 mg x 3 days as instructed by Dr Koneswaran 11/12/2016 at office visit.  Prescribed and confirmed dosage: Torsemide 20 mg 2 tablets (40 mg total) twice a day.  Metolazone 5 mg as directed  Labs: 10/13/2017Creatinine 1.30, BUN 51, Potassium 4.2, Sodium 138, EGFR 57->60 04/16/2016 Creatinine 1.29, BUN 49, Potassium 4.1, Sodium 136  01/22/2016 Creatinine 1.34, BUN 37, Potassium 4.3, Sodium 136, EGFR 55->60  09/17/2015 Creatinine 0.92, BUN 25, Potassium 4.7, Sodium 137, EGFR >60   Recommendations:  Advised would send copy of ICM to Dr Koneswaran for recommendations.  No changes in symptoms since office visit.   Follow-up plan: ICM clinic phone appointment on 12/07/2016.  Office appointment scheduled on 12/24/2016 with Dr Koneswaran.  Copy of ICM check sent to primary cardiologist and device physician.   3 month ICM trend: 11/16/2016   1 Year ICM trend:      Laurie S Short, RN 11/17/2016 8:01 AM    

## 2016-11-17 NOTE — Telephone Encounter (Signed)
Patient last seen by Dr. Bronson Ing on 11/12/2016.  Weight at OV was 294 lbs.  Was told to take Metolazone 5mg  daily x 3 days, then back to as needed and call with update on weights.  Stated his weights has not gone down any.  Stayed consistently at 294 since that OV.  Message fwd to Dr. Harl Bowie as Dr. Bronson Ing is out of the office today.

## 2016-11-17 NOTE — Telephone Encounter (Signed)
Remote ICM transmission received.  Attempted patient call and left message to return call.   

## 2016-11-17 NOTE — Telephone Encounter (Signed)
He needs a BMET and Mg lab before any additional diuretic changes. If he can do today or tomorrow that would help guide [place]   Zandra Abts MD

## 2016-11-18 NOTE — Telephone Encounter (Signed)
Mailed pt lab orders says he will have done tomorrow - per [name] pt will take metolazone 5 mg for 3 days then call back with an update

## 2016-11-18 NOTE — Progress Notes (Signed)
Please have him take metolazone 5 mg daily for an extra 3 days. If it remains swollen, let me know.

## 2016-11-18 NOTE — Progress Notes (Signed)
Pt voiced understanding says he would take metolazone 5 mg for the next 3 days and call back with an update. Pt weight today 294lbs - will mail lab orders to pt as requested BMP/MG

## 2016-11-19 NOTE — Progress Notes (Signed)
Next ICM remote transmission scheduled for 11/23/2016.

## 2016-11-23 ENCOUNTER — Telehealth: Payer: Self-pay

## 2016-11-23 ENCOUNTER — Ambulatory Visit (INDEPENDENT_AMBULATORY_CARE_PROVIDER_SITE_OTHER): Payer: Medicare Other

## 2016-11-23 DIAGNOSIS — I5022 Chronic systolic (congestive) heart failure: Secondary | ICD-10-CM

## 2016-11-23 DIAGNOSIS — Z95810 Presence of automatic (implantable) cardiac defibrillator: Secondary | ICD-10-CM

## 2016-11-23 NOTE — Progress Notes (Signed)
EPIC Encounter for ICM Monitoring  Patient Name: NIRAV SWEDA is a 56 y.o. male Date: 11/23/2016 Primary Care Physican: [name], MD Primary [place] Electrophysiologist: Allred Dry Weight:279 lbs (dropped from 294 lbs since 4/3) Bi-V Pacing: 94.7%        Heart Failure questions reviewed, pt reported he is feeling so much better.  Weight decreased 15 lbs and swelling in belly has resolved.   Thoracic impedance continues to be abnormal suggesting fluid accumulation after taking Metolazone 5 mg for a total of 6 days.  Prescribed and confirmed dosage: Torsemide 20 mg 2 tablets (40 mg total) twice a day. Metolazone 5 mg as directed.  Labs: 10/13/2017Creatinine 1.30, BUN 51, Potassium 4.2, Sodium 138, EGFR 57->60 04/16/2016 Creatinine 1.29, BUN 49, Potassium 4.1, Sodium 136  01/22/2016 Creatinine 1.34, BUN 37, Potassium 4.3, Sodium 136, EGFR 55->60  09/17/2015 Creatinine 0.92, BUN 25, Potassium 4.7, Sodium 137, EGFR >60   Recommendations:  Copy of ICM check sent to [name] and [name] for review and any further recommendations.   Patient had lab work completed today.     Follow-up plan: ICM clinic phone appointment on 12/07/2016.  Office appointment scheduled on 12/24/2016 with [name].   3 month ICM trend: 11/23/2016   1 Year ICM trend:      Rosalene Billings, RN 11/23/2016 2:09 PM

## 2016-11-23 NOTE — Telephone Encounter (Signed)
ICM call to patient and requested to send remote transmission.

## 2016-11-26 ENCOUNTER — Telehealth: Payer: Self-pay | Admitting: *Deleted

## 2016-11-26 DIAGNOSIS — I428 Other cardiomyopathies: Secondary | ICD-10-CM

## 2016-11-26 NOTE — Telephone Encounter (Signed)
From result note regarding 11/23/16 labs:  Notes recorded by Mechele Dawley, RN on 11/26/2016 at 4:57 PM EDT  Patient reports he stopped taking metolazone on 4/9. Instructed patient to resume metolazone 5mg  tab once daily every Mon, Wed, and Fri. Instructed to continue current torsemide dosage. Pt verbalizes understanding of instructions. Pt requests that BMET be ordered to [facility] for next week. See phone note for details. ------  Notes recorded by Herminio Commons, MD on 11/26/2016 at 11:26 AM EDT How often is he now taking metolazone? I would have him take it every Mon, Wed, and Fri and repeat BMET in one week. Continue current torsemide dosage.

## 2016-11-30 MED ORDER — METOLAZONE 5 MG PO TABS: ORAL_TABLET | ORAL | 6 refills | Status: AC

## 2016-11-30 NOTE — Telephone Encounter (Signed)
ICM follow up call to patient regarding BMET lab needed this week and Metolazone order.  Asked if he received instructions on new order on 11/26/2016 by [name] to take Metolazone 5 mg 1 tablet every Mon, Wed and Fri and he confirmed he did but did not receive new prescription.     Advised would send script today to [facility] in [place].  Also BMET is ordered and he would prefer to have it drawn at [facility] labs in [place] instead of at [facility].  He said it is more difficult for him to go into the lab at the hospital.

## 2016-11-30 NOTE — Telephone Encounter (Signed)
Call back to patient and advised he would need to call [facility] lab and check if his insurance will cover that lab.  He said he does not think it does and will go to [facility] for labs.  Advised will need to be drawn on 12/03/2016 and he agreed.  He will need someone to help him get there but should be fine.  Lab order faxed to [facility].

## 2016-12-03 ENCOUNTER — Other Ambulatory Visit: Payer: Self-pay | Admitting: *Deleted

## 2016-12-03 DIAGNOSIS — I428 Other cardiomyopathies: Secondary | ICD-10-CM

## 2016-12-07 ENCOUNTER — Ambulatory Visit (INDEPENDENT_AMBULATORY_CARE_PROVIDER_SITE_OTHER): Payer: Medicare Other | Admitting: *Deleted

## 2016-12-07 DIAGNOSIS — Z95810 Presence of automatic (implantable) cardiac defibrillator: Secondary | ICD-10-CM | POA: Diagnosis not present

## 2016-12-07 DIAGNOSIS — I428 Other cardiomyopathies: Secondary | ICD-10-CM

## 2016-12-07 DIAGNOSIS — I5022 Chronic systolic (congestive) heart failure: Secondary | ICD-10-CM

## 2016-12-07 NOTE — Progress Notes (Signed)
Remote ICD transmission.   

## 2016-12-07 NOTE — Progress Notes (Signed)
EPIC Encounter for ICM Monitoring  Patient Name: [name] is a 56 y.o. male Date: 12/07/2016 Primary Care Physican: [name], MD Primary [place] Electrophysiologist: Allred Dry Weight:279 lbs (dropped from 294 lbs since 4/3) Bi-V Pacing:  92.2%       Heart Failure questions reviewed, pt asymptomatic.  He reported he is feeling good at this time.  Weight remains stable at 279 lbs.    Thoracic impedance has returned to normal.  Prescribed and confirmed dosage: Torsemide 20 mg 2 tablets (40 mg total) twice a day. Metolazone 5 mg 1 tablet Monday, Wednesday and Friday.  Labs: 12/03/2016 Creatinine 1.63, BUN 50, Potassium 4.7, Sodium 139, EGFR 44-53 11/23/2016 Creatinine 1.68, BUN 50, Potassium 4.7  Sodium 141, EGFR 42-51 10/13/2017Creatinine 1.30, BUN 51, Potassium 4.2, Sodium 138, EGFR 57->60 04/16/2016 Creatinine 1.29, BUN 49, Potassium 4.1, Sodium 136  01/22/2016 Creatinine 1.34, BUN 37, Potassium 4.3, Sodium 136, EGFR 55->60  09/17/2015 Creatinine 0.92, BUN 25, Potassium 4.7, Sodium 137, EGFR >60  Recommendations: No changes.   Encouraged to call for fluid symptoms.  Follow-up plan: ICM clinic phone appointment on 12/22/2016 (before office visit).  Office appointment scheduled on 12/24/2016 with [name].  Copy of ICM check sent to primary cardiologist and device physician.   3 month ICM trend: 12/07/2016   1 Year ICM trend:      Rosalene Billings, RN 12/07/2016 10:58 AM

## 2016-12-08 ENCOUNTER — Telehealth: Payer: Self-pay | Admitting: *Deleted

## 2016-12-08 NOTE — Telephone Encounter (Signed)
Notes recorded by Laurine Blazer, LPN on 03/11/3663 at 4:03 PM EDT Patient notified. Copy to pmd. ------  Notes recorded by Herminio Commons, MD on 12/03/2016 at 4:25 PM EDT Stable from prior test.

## 2016-12-10 ENCOUNTER — Encounter: Payer: Self-pay | Admitting: Cardiology

## 2016-12-10 LAB — CUP PACEART REMOTE DEVICE CHECK
Brady Statistic AP VP Percent: 76.92 %
Brady Statistic AP VS Percent: 0.08 %
Brady Statistic AS VP Percent: 22.85 %
Brady Statistic RA Percent Paced: 74.72 %
Brady Statistic RV Percent Paced: 92.25 %
HighPow Impedance: 58 Ohm
Implantable Lead Implant Date: 20150313
Implantable Lead Implant Date: 20150313
Implantable Lead Location: 753858
Implantable Pulse Generator Implant Date: 20150313
Lead Channel Impedance Value: 361 Ohm
Lead Channel Impedance Value: 361 Ohm
Lead Channel Impedance Value: 399 Ohm
Lead Channel Impedance Value: 418 Ohm
Lead Channel Impedance Value: 456 Ohm
Lead Channel Impedance Value: 665 Ohm
Lead Channel Impedance Value: 665 Ohm
Lead Channel Pacing Threshold Amplitude: 0.875 V
Lead Channel Pacing Threshold Pulse Width: 0.4 ms
Lead Channel Pacing Threshold Pulse Width: 0.4 ms
Lead Channel Sensing Intrinsic Amplitude: 1.25 mV
Lead Channel Sensing Intrinsic Amplitude: 8 mV
Lead Channel Sensing Intrinsic Amplitude: 8 mV
Lead Channel Setting Pacing Amplitude: 2 V
Lead Channel Setting Pacing Amplitude: 2.25 V
Lead Channel Setting Pacing Pulse Width: 0.4 ms
MDC IDC LEAD IMPLANT DT: 20150313
MDC IDC LEAD LOCATION: 753859
MDC IDC LEAD LOCATION: 753860
MDC IDC MSMT BATTERY REMAINING LONGEVITY: 48 mo
MDC IDC MSMT BATTERY VOLTAGE: 2.96 V
MDC IDC MSMT LEADCHNL LV IMPEDANCE VALUE: 532 Ohm
MDC IDC MSMT LEADCHNL LV IMPEDANCE VALUE: 589 Ohm
MDC IDC MSMT LEADCHNL LV IMPEDANCE VALUE: 646 Ohm
MDC IDC MSMT LEADCHNL LV IMPEDANCE VALUE: 703 Ohm
MDC IDC MSMT LEADCHNL LV PACING THRESHOLD AMPLITUDE: 1.25 V
MDC IDC MSMT LEADCHNL LV PACING THRESHOLD PULSEWIDTH: 0.4 ms
MDC IDC MSMT LEADCHNL RA PACING THRESHOLD AMPLITUDE: 1 V
MDC IDC MSMT LEADCHNL RA SENSING INTR AMPL: 1.25 mV
MDC IDC MSMT LEADCHNL RV IMPEDANCE VALUE: 285 Ohm
MDC IDC MSMT LEADCHNL RV IMPEDANCE VALUE: 304 Ohm
MDC IDC SESS DTM: 20180423062602
MDC IDC SET LEADCHNL RV PACING AMPLITUDE: 2.5 V
MDC IDC SET LEADCHNL RV PACING PULSEWIDTH: 0.4 ms
MDC IDC SET LEADCHNL RV SENSING SENSITIVITY: 0.3 mV
MDC IDC STAT BRADY AS VS PERCENT: 0.15 %

## 2016-12-15 ENCOUNTER — Other Ambulatory Visit: Payer: Self-pay | Admitting: Cardiovascular Disease

## 2016-12-22 ENCOUNTER — Ambulatory Visit (INDEPENDENT_AMBULATORY_CARE_PROVIDER_SITE_OTHER): Payer: Medicare Other

## 2016-12-22 DIAGNOSIS — I5022 Chronic systolic (congestive) heart failure: Secondary | ICD-10-CM

## 2016-12-22 DIAGNOSIS — Z95810 Presence of automatic (implantable) cardiac defibrillator: Secondary | ICD-10-CM

## 2016-12-22 NOTE — Progress Notes (Signed)
EPIC Encounter for ICM Monitoring  Patient Name: [name] is a 56 y.o. male Date: 12/22/2016 Primary Care Physican: [name], MD Primary [place] Electrophysiologist: Allred Dry Weight:275lbs  Bi-V Pacing:  98%        Heart Failure questions reviewed, pt asymptomatic.   Thoracic impedance normal.  Prescribed and confirmed dosage: Torsemide 20 mg 2 tablets (40 mg total) twice a day. Metolazone 5 mg 1 tablet Monday, Wednesday and Friday.  Labs: 12/03/2016 Creatinine 1.63, BUN 50, Potassium 4.7, Sodium 139, EGFR 44-53 11/23/2016 Creatinine 1.68, BUN 50, Potassium 4.7  Sodium 141, EGFR 42-51 10/13/2017Creatinine 1.30, BUN 51, Potassium 4.2, Sodium 138, EGFR 57->60 04/16/2016 Creatinine 1.29, BUN 49, Potassium 4.1, Sodium 136  01/22/2016 Creatinine 1.34, BUN 37, Potassium 4.3, Sodium 136, EGFR 55->60  09/17/2015 Creatinine 0.92, BUN 25, Potassium 4.7, Sodium 137, EGFR >60  Recommendations: No changes. Discussed to limit salt intake to 2000 mg/day and fluid intake to < 2 liters/day.  Encouraged to call for fluid symptoms or use local ER for any urgent symptoms.  Follow-up plan: ICM clinic phone appointment on 01/07/2017. Office appointment scheduled on 12/24/2016 with [name].  Copy of ICM check sent to primary cardiologist and device physician.   3 month ICM trend: 12/22/2016   1 Year ICM trend:      Rosalene Billings, RN 12/22/2016 11:59 AM

## 2016-12-23 ENCOUNTER — Encounter: Payer: Self-pay | Admitting: *Deleted

## 2016-12-24 ENCOUNTER — Ambulatory Visit (INDEPENDENT_AMBULATORY_CARE_PROVIDER_SITE_OTHER): Payer: Medicare Other | Admitting: Cardiovascular Disease

## 2016-12-24 ENCOUNTER — Encounter: Payer: Self-pay | Admitting: Cardiovascular Disease

## 2016-12-24 VITALS — BP 148/91 | HR 107 | Ht 71.0 in | Wt 276.0 lb

## 2016-12-24 DIAGNOSIS — I480 Paroxysmal atrial fibrillation: Secondary | ICD-10-CM | POA: Diagnosis not present

## 2016-12-24 DIAGNOSIS — Z95810 Presence of automatic (implantable) cardiac defibrillator: Secondary | ICD-10-CM | POA: Diagnosis not present

## 2016-12-24 DIAGNOSIS — N183 Chronic kidney disease, stage 3 unspecified: Secondary | ICD-10-CM

## 2016-12-24 DIAGNOSIS — I5022 Chronic systolic (congestive) heart failure: Secondary | ICD-10-CM | POA: Diagnosis not present

## 2016-12-24 DIAGNOSIS — I10 Essential (primary) hypertension: Secondary | ICD-10-CM | POA: Diagnosis not present

## 2016-12-24 NOTE — Patient Instructions (Signed)
Medication Instructions:  Continue all current medications.  Labwork: none  Testing/Procedures: none  Follow-Up: 3-4 months   Any Other Special Instructions Will Be Listed Below (If Applicable).  If you need a refill on your cardiac medications before your next appointment, please call your pharmacy.  

## 2016-12-24 NOTE — Progress Notes (Signed)
SUBJECTIVE: The patient presents for follow-up of chronic systolic heart failure and atrial fibrillation. Thoracic impedance was normal on 12/22/16. I reviewed all documentation. He was asymptomatic at that time. He takes 40 mg torsemide twice daily and metolazone 5 mg every Monday, Wednesday, and Friday.  Echocardiogram July 2017, LVEF 30-35%.  Weight is down to 276 pounds. Had been 294 pounds on 11/12/16.  BUN 50, creatinine 1.63 on 12/03/16.  He is feeling well. He has NYHA class II symptoms. He has exertional dyspnea which is mild and recovers fairly quickly with rest.   Review of Systems: As per "subjective", otherwise negative.  No Known Allergies  Current Outpatient Prescriptions  Medication Sig Dispense Refill  . allopurinol (ZYLOPRIM) 300 MG tablet Take 1 tablet (300 mg total) by mouth daily. 30 tablet 5  . atorvastatin (LIPITOR) 10 MG tablet Take 1 tablet (10 mg total) by mouth every morning. 30 tablet 1  . cloNIDine (CATAPRES) 0.2 MG tablet Take 1 tablet (0.2 mg total) by mouth 2 (two) times daily. 60 tablet 1  . COLCRYS 0.6 MG tablet Take 0.6 mg by mouth daily as needed (gout).     . dabigatran (PRADAXA) 150 MG CAPS capsule Take 1 capsule (150 mg total) by mouth 2 (two) times daily. 60 capsule 11  . digoxin (LANOXIN) 0.125 MG tablet Take 1 tablet (0.125 mg total) by mouth daily. 30 tablet 1  . gemfibrozil (LOPID) 600 MG tablet Take 1 tablet (600 mg total) by mouth 2 (two) times daily before a meal. 60 tablet 1  . HYDROcodone-acetaminophen (NORCO/VICODIN) 5-325 MG tablet Take 1-2 tablets by mouth every 4 (four) hours as needed for moderate pain. 90 tablet 0  . insulin detemir (LEVEMIR) 100 UNIT/ML injection Inject 0.5 mLs (50 Units total) into the skin at bedtime. 10 mL 11  . LITE TOUCH PEN NEEDLES 31G X 5 MM MISC USE AS DIRECTED UP TO 5 INJECTIONS DAILY  7  . losartan (COZAAR) 25 MG tablet TAKE ONE TABLET BY MOUTH DAILY 30 tablet 0  . metFORMIN (GLUCOPHAGE) 1000 MG  tablet Take 1,000 mg by mouth 2 (two) times daily.  1  . methocarbamol (ROBAXIN) 500 MG tablet Take 1 tablet (500 mg total) by mouth every 6 (six) hours as needed for muscle spasms. 60 tablet 0  . metolazone (ZAROXOLYN) 5 MG tablet Take 1 tablet (5 mg total) every Monday, Wednesday and Friday. 30 tablet 6  . metoprolol (LOPRESSOR) 50 MG tablet TAKE 1 AND 1/2 TABLETS BY MOUTH TWICE DAILY. 270 tablet 3  . Multiple Vitamin (MULTIVITAMIN WITH MINERALS) TABS tablet Take 1 tablet by mouth daily.    [name] NOVOLOG FLEXPEN 100 UNIT/ML FlexPen INJECT SUBCUTANEOUSLY 25 UNITS IN THE MORNING AND 25 AT NOON AND 35 UNITS AT DINNER  6  . prazosin (MINIPRESS) 1 MG capsule TAKE ONE CAPSULE BY MOUTH TWICE DAILY. 60 capsule 6  . spironolactone (ALDACTONE) 25 MG tablet Take 1 tablet (25 mg total) by mouth daily. 90 tablet 3  . torsemide (DEMADEX) 20 MG tablet TAKE TWO (2) TABLETS BY MOUTH TWICE DAILY. 120 tablet 11  . ULTICARE MINI PEN NEEDLES 31G X 6 MM MISC 1 each by Does not apply route daily.      No current facility-administered medications for this visit.     Past Medical History:  Diagnosis Date  . Atrial flutter ([place])  10/01/2006, 12/30/11   Status post RF ablation, 7/13, Dr. [name]  . Chronic systolic dysfunction of left ventricle  EF 20-25%, global HK; mild RVD, 2-D echo, 5/13  . Hypertension   . Morbid obesity ([place])   . Nonischemic cardiomyopathy ([place])    Normal coronary arteries, 2008  . OSA (obstructive sleep apnea)    compliant with CPAP  . Pulmonary hypertension ([facility])    RVSP 50-55 mmHg, 5/13  . RBBB, anterior fascicular block and incomplete LBBB   . Type II or unspecified type diabetes mellitus without mention of complication, not stated as uncontrolled   . Unspecified disorder of kidney and ureter   . Valvular heart disease    Mild MR/TR, 2-D echo, 5/13    Past Surgical History:  Procedure Laterality Date  . AMPUTATION Right 05/21/2015   Procedure: AMPUTATION BELOW KNEE;  Surgeon:  Newt Minion, MD;  Location: [facility];  Service: Orthopedics;  Laterality: Right;  . ATRIAL FLUTTER ABLATION N/A 03/04/2012   Procedure: ATRIAL FLUTTER ABLATION;  Surgeon: Thompson Grayer, MD;  Location: [facility] CATH LAB;  Service: Cardiovascular;  Laterality: N/A;  . BI-VENTRICULAR IMPLANTABLE CARDIOVERTER DEFIBRILLATOR N/A 10/27/2013   Procedure: BI-VENTRICULAR IMPLANTABLE CARDIOVERTER DEFIBRILLATOR  (CRT-D);  Surgeon: Coralyn Mark, MD;  Location: [facility] CATH LAB;  Service: Cardiovascular;  Laterality: N/A;  . BI-VENTRICULAR IMPLANTABLE CARDIOVERTER DEFIBRILLATOR  (CRT-D)  10/27/2013   MDT [name] XT CRTD implanted by [name] for NICM, CHF  . CATARACT EXTRACTION W/PHACO Right 03/25/2015   Procedure: CATARACT EXTRACTION PHACO AND INTRAOCULAR LENS PLACEMENT RIGHT EYE CDE=12.24;  Surgeon: Tonny Branch, MD;  Location: [facility];  Service: Ophthalmology;  Laterality: Right;  . CYSTOSCOPY/RETROGRADE/URETEROSCOPY Bilateral 10/16/2014   Procedure: CYSTO WITH BILATERAL Freddie Breech WITH GYRUS;  Surgeon: Malka So, MD;  Location: [facility];  Service: Urology;  Laterality: Bilateral;  . TRANSURETHRAL RESECTION OF BLADDER TUMOR WITH GYRUS (TURBT-GYRUS) N/A 10/16/2014   Procedure: TRANSURETHRAL RESECTION OF BLADDER TUMOR WITH GYRUS (TURBT-GYRUS);  Surgeon: Malka So, MD;  Location: [facility];  Service: Urology;  Laterality: N/A;    Social History   Social History  . Marital status: Single    Spouse name: N/A  . Number of children: N/A  . Years of education: N/A   Occupational History  . DISABLED    Social History Main Topics  . Smoking status: Never Smoker  . Smokeless tobacco: Never Used  . Alcohol use No  . Drug use: No  . Sexual activity: No   Other Topics Concern  . Not on file   Social History Narrative   Pt lives in [place] alone.  Disabled.  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     Vitals:   12/24/16 1357  BP: (!) 148/91  Pulse: (!) 107  SpO2: 97%  Weight: 276 lb (125.2 kg)    Height: 5\' 11"  (1.803 m)    Wt Readings from Last 3 Encounters:  12/24/16 276 lb (125.2 kg)  11/12/16 294 lb (133.4 kg)  07/03/16 270 lb (122.5 kg)     PHYSICAL EXAM General: NAD HEENT: Normal. Neck: No JVD, no thyromegaly. Lungs: Clear. CV: Regular rate and rhythm, normal K2/HCWCB S2, 1/6 systolic murmur along left sternal border and over RUSB. Right leg BKA. Left leg with trace edema. Abdomen: Firm, non distended, obese. Neurologic: Alert and oriented x 3.  Psych: Normal affect. Skin: Stasis dermatitis of left leg with chronic erythema. Musculoskeletal: Right BKA.    ECG: Most recent ECG reviewed.   Labs: Lab Results  Component Value Date/Time   K 4.1 04/15/2016 10:06 AM   BUN 49 (  H) 04/15/2016 10:06 AM   CREATININE 1.29 04/15/2016 10:06 AM   ALT 19 05/24/2015 05:20 AM   HGB 9.1 (L) 06/07/2015 05:05 AM     Lipids: No results found for: LDLCALC, LDLDIRECT, CHOL, TRIG, HDL     ASSESSMENT AND PLAN: 1. Chronic systolic heart failure/nonischemic cardiomyopathy s/p CRTD, EF 35%: Appears euvolemic on current diuretic regimen. No changes to therapy.  2. Essential HTN: Mildly elevated. Will monitor.  3. Atrial fibrillation: Heart rate is controlled. He is appropriately anticoagulated. No changes to therapy.  4. CRT-D: Functioning normally.  5. CKD stage 3: Stable.      Disposition: Follow up 3-4 months  Kate Sable, M.D., F.A.C.C.

## 2017-01-04 ENCOUNTER — Other Ambulatory Visit: Payer: Self-pay | Admitting: Cardiovascular Disease

## 2017-01-07 ENCOUNTER — Ambulatory Visit (INDEPENDENT_AMBULATORY_CARE_PROVIDER_SITE_OTHER): Payer: Medicare Other

## 2017-01-07 DIAGNOSIS — Z95810 Presence of automatic (implantable) cardiac defibrillator: Secondary | ICD-10-CM | POA: Diagnosis not present

## 2017-01-07 DIAGNOSIS — I5022 Chronic systolic (congestive) heart failure: Secondary | ICD-10-CM

## 2017-01-07 NOTE — Progress Notes (Signed)
EPIC Encounter for ICM Monitoring  Patient Name: [name] is a 56 y.o. male Date: 01/07/2017 Primary Care Physican: [name], MD Primary [place] Electrophysiologist: Allred Dry Weight:275lbs  Bi-V Pacing: 97.3%           Heart Failure questions reviewed, pt asymptomatic    Thoracic impedance normal   Prescribed and confirmed dosage: Torsemide 20 mg 2 tablets (40 mg total) twice a day. Metolazone 5 mg 1 tablet Monday, Wednesday and Friday.  Labs: 12/03/2016 Creatinine 1.63, BUN 50, Potassium 4.7, Sodium 139, EGFR 44-53 11/23/2016 Creatinine 1.68, BUN 50, Potassium 4.7 Sodium 141, EGFR 42-51 10/13/2017Creatinine 1.30, BUN 51, Potassium 4.2, Sodium 138, EGFR 57->60 04/16/2016 Creatinine 1.29, BUN 49, Potassium 4.1, Sodium 136  01/22/2016 Creatinine 1.34, BUN 37, Potassium 4.3, Sodium 136, EGFR 55->60  09/17/2015 Creatinine 0.92, BUN 25, Potassium 4.7, Sodium 137, EGFR >60  Recommendations: No changes. Discussed to limit salt intake to 2000 mg/day and fluid intake to < 2 liters/day.  Encouraged to call for fluid symptoms or use local ER for any urgent symptoms.  Follow-up plan: ICM clinic phone appointment on 02/09/2017.    Copy of ICM check sent to primary cardiologist and device physician.   3 month ICM trend: 01/07/2017   1 Year ICM trend:      Rosalene Billings, RN 01/07/2017 2:17 PM

## 2017-01-15 ENCOUNTER — Encounter: Payer: Medicare Other | Admitting: Internal Medicine

## 2017-02-09 ENCOUNTER — Ambulatory Visit (INDEPENDENT_AMBULATORY_CARE_PROVIDER_SITE_OTHER): Payer: Medicare Other

## 2017-02-09 DIAGNOSIS — I5022 Chronic systolic (congestive) heart failure: Secondary | ICD-10-CM

## 2017-02-09 DIAGNOSIS — Z95810 Presence of automatic (implantable) cardiac defibrillator: Secondary | ICD-10-CM | POA: Diagnosis not present

## 2017-02-09 NOTE — Progress Notes (Signed)
EPIC Encounter for ICM Monitoring  Patient Name: [name] is a 56 y.o. male Date: 02/09/2017 Primary Care Physican: [name], MD Primary [place] Electrophysiologist: Allred Dry Weight:273lbs  Bi-V Pacing: 93.7%      Heart Failure questions reviewed, pt asymptomatic.   Thoracic impedance normal.  Prescribed dosage: Torsemide 20 mg 2 tablets (40 mg total) twice a day. Metolazone 5 mg 1 tablet Monday, Wednesday and Friday.  Labs: 12/03/2016 Creatinine 1.63, BUN 50, Potassium 4.7, Sodium 139, EGFR 44-53 11/23/2016 Creatinine 1.68, BUN 50, Potassium 4.7 Sodium 141, EGFR 42-51 10/13/2017Creatinine 1.30, BUN 51, Potassium 4.2, Sodium 138, EGFR 57->60 04/16/2016 Creatinine 1.29, BUN 49, Potassium 4.1, Sodium 136  01/22/2016 Creatinine 1.34, BUN 37, Potassium 4.3, Sodium 136, EGFR 55->60  09/17/2015 Creatinine 0.92, BUN 25, Potassium 4.7, Sodium 137, EGFR >60  Recommendations: No changes.   Encouraged to call for fluid symptoms.  Follow-up plan: ICM clinic phone appointment on 03/15/2017.  Office appointment scheduled 04/07/2017 with Dr. Bronson Ing.  Copy of ICM check sent to device physician.   3 month ICM trend: 02/09/2017   1 Year ICM trend:      Rosalene Billings, RN 02/09/2017 9:10 AM

## 2017-03-15 ENCOUNTER — Ambulatory Visit (INDEPENDENT_AMBULATORY_CARE_PROVIDER_SITE_OTHER): Payer: Medicare Other | Admitting: *Deleted

## 2017-03-15 DIAGNOSIS — Z95810 Presence of automatic (implantable) cardiac defibrillator: Secondary | ICD-10-CM | POA: Diagnosis not present

## 2017-03-15 DIAGNOSIS — I5022 Chronic systolic (congestive) heart failure: Secondary | ICD-10-CM

## 2017-03-15 DIAGNOSIS — I428 Other cardiomyopathies: Secondary | ICD-10-CM | POA: Diagnosis not present

## 2017-03-15 NOTE — Progress Notes (Signed)
Remote ICD transmission.   

## 2017-03-16 NOTE — Progress Notes (Signed)
EPIC Encounter for ICM Monitoring  Patient Name: [name] is a 56 y.o. male Date: 03/16/2017 Primary Care Physican: [name], MD Primary [place] Electrophysiologist: Allred Dry Weight:263lbs  Bi-V Pacing: 99.4%        Heart Failure questions reviewed, pt asymptomatic.   Thoracic impedance normal.  Prescribed dosage: Torsemide 20 mg 2 tablets (40 mg total) twice a day. Metolazone 5 mg 1 tablet Monday, Wednesday and Friday.  Labs: 12/03/2016 Creatinine 1.63, BUN 50, Potassium 4.7, Sodium 139, EGFR 44-53 11/23/2016 Creatinine 1.68, BUN 50, Potassium 4.7 Sodium 141, EGFR 42-51 10/13/2017Creatinine 1.30, BUN 51, Potassium 4.2, Sodium 138, EGFR 57->60 04/16/2016 Creatinine 1.29, BUN 49, Potassium 4.1, Sodium 136  01/22/2016 Creatinine 1.34, BUN 37, Potassium 4.3, Sodium 136, EGFR 55->60  09/17/2015 Creatinine 0.92, BUN 25, Potassium 4.7, Sodium 137, EGFR >60  Recommendations: No changes.  Advised to limit salt intake to 2000 mg/day and fluid intake to < 2 liters/day.  Encouraged to call for fluid symptoms.  Follow-up plan: ICM clinic phone appointment on 04/16/2017.  Office appointment scheduled 04/13/2017 with Dr. Bronson Ing.  Copy of ICM check sent to device physician.   3 month ICM trend: 03/16/2017   1 Year ICM trend:      Rosalene Billings, RN 03/16/2017 2:47 PM

## 2017-03-18 LAB — CUP PACEART REMOTE DEVICE CHECK
Battery Voltage: 2.96 V
Brady Statistic AP VP Percent: 89.25 %
Brady Statistic AP VS Percent: 0.04 %
Brady Statistic AS VP Percent: 10.59 %
Brady Statistic AS VS Percent: 0.12 %
Brady Statistic RV Percent Paced: 96.06 %
HighPow Impedance: 59 Ohm
Implantable Lead Implant Date: 20150313
Implantable Lead Implant Date: 20150313
Implantable Lead Location: 753859
Implantable Lead Location: 753860
Implantable Lead Model: 4598
Implantable Lead Model: 5076
Implantable Lead Model: 6935
Implantable Pulse Generator Implant Date: 20150313
Lead Channel Impedance Value: 361 Ohm
Lead Channel Impedance Value: 399 Ohm
Lead Channel Impedance Value: 456 Ohm
Lead Channel Impedance Value: 456 Ohm
Lead Channel Impedance Value: 646 Ohm
Lead Channel Impedance Value: 703 Ohm
Lead Channel Pacing Threshold Amplitude: 0.875 V
Lead Channel Pacing Threshold Amplitude: 1.125 V
Lead Channel Pacing Threshold Amplitude: 1.25 V
Lead Channel Pacing Threshold Pulse Width: 0.4 ms
Lead Channel Sensing Intrinsic Amplitude: 1.125 mV
Lead Channel Sensing Intrinsic Amplitude: 1.125 mV
Lead Channel Sensing Intrinsic Amplitude: 7.375 mV
Lead Channel Setting Pacing Amplitude: 2.25 V
Lead Channel Setting Pacing Pulse Width: 0.4 ms
Lead Channel Setting Sensing Sensitivity: 0.3 mV
MDC IDC LEAD IMPLANT DT: 20150313
MDC IDC LEAD LOCATION: 753858
MDC IDC MSMT BATTERY REMAINING LONGEVITY: 42 mo
MDC IDC MSMT LEADCHNL LV IMPEDANCE VALUE: 589 Ohm
MDC IDC MSMT LEADCHNL LV IMPEDANCE VALUE: 703 Ohm
MDC IDC MSMT LEADCHNL LV IMPEDANCE VALUE: 722 Ohm
MDC IDC MSMT LEADCHNL LV IMPEDANCE VALUE: 779 Ohm
MDC IDC MSMT LEADCHNL LV PACING THRESHOLD PULSEWIDTH: 0.4 ms
MDC IDC MSMT LEADCHNL RA IMPEDANCE VALUE: 418 Ohm
MDC IDC MSMT LEADCHNL RA PACING THRESHOLD PULSEWIDTH: 0.4 ms
MDC IDC MSMT LEADCHNL RV IMPEDANCE VALUE: 285 Ohm
MDC IDC MSMT LEADCHNL RV IMPEDANCE VALUE: 342 Ohm
MDC IDC MSMT LEADCHNL RV SENSING INTR AMPL: 7.375 mV
MDC IDC SESS DTM: 20180730041607
MDC IDC SET LEADCHNL LV PACING PULSEWIDTH: 0.4 ms
MDC IDC SET LEADCHNL RA PACING AMPLITUDE: 2.25 V
MDC IDC SET LEADCHNL RV PACING AMPLITUDE: 2.5 V
MDC IDC STAT BRADY RA PERCENT PACED: 88.13 %

## 2017-03-19 ENCOUNTER — Encounter: Payer: Self-pay | Admitting: Cardiology

## 2017-03-25 ENCOUNTER — Other Ambulatory Visit: Payer: Self-pay | Admitting: Cardiovascular Disease

## 2017-04-07 ENCOUNTER — Ambulatory Visit: Payer: Medicare Other | Admitting: Cardiovascular Disease

## 2017-04-13 ENCOUNTER — Ambulatory Visit (INDEPENDENT_AMBULATORY_CARE_PROVIDER_SITE_OTHER): Payer: Medicare Other | Admitting: Cardiovascular Disease

## 2017-04-13 VITALS — BP 140/74 | HR 105 | Ht 70.0 in | Wt 260.0 lb

## 2017-04-13 DIAGNOSIS — I480 Paroxysmal atrial fibrillation: Secondary | ICD-10-CM | POA: Diagnosis not present

## 2017-04-13 DIAGNOSIS — I5022 Chronic systolic (congestive) heart failure: Secondary | ICD-10-CM | POA: Diagnosis not present

## 2017-04-13 DIAGNOSIS — N183 Chronic kidney disease, stage 3 unspecified: Secondary | ICD-10-CM

## 2017-04-13 DIAGNOSIS — I10 Essential (primary) hypertension: Secondary | ICD-10-CM

## 2017-04-13 DIAGNOSIS — Z95810 Presence of automatic (implantable) cardiac defibrillator: Secondary | ICD-10-CM | POA: Diagnosis not present

## 2017-04-13 NOTE — Progress Notes (Signed)
SUBJECTIVE: The patient presents for follow-up of chronic systolic heart failure and atrial fibrillation.  Echocardiogram July 2017, LVEF 30-35%.  Thoracic impedance was normal on 03/15/17. He takes torsemide 40 mg twice daily and metolazone 5 mg every Monday, Wednesday, and Friday.  Device interrogation on 03/15/17 demonstrated normal device function with 6 beats of NSVT. He has a bi-V ICD.  He is feeling very well and denies chest pain, palpitations, and shortness of breath. He does not complain of abdominal distention.   Review of Systems: As per "subjective", otherwise negative.  No Known Allergies  Current Outpatient Prescriptions  Medication Sig Dispense Refill  . allopurinol (ZYLOPRIM) 300 MG tablet Take 1 tablet (300 mg total) by mouth daily. 30 tablet 5  . atorvastatin (LIPITOR) 10 MG tablet Take 1 tablet (10 mg total) by mouth every morning. 30 tablet 1  . cloNIDine (CATAPRES) 0.2 MG tablet Take 1 tablet (0.2 mg total) by mouth 2 (two) times daily. 60 tablet 1  . COLCRYS 0.6 MG tablet Take 0.6 mg by mouth daily as needed (gout).     . dabigatran (PRADAXA) 150 MG CAPS capsule Take 1 capsule (150 mg total) by mouth 2 (two) times daily. 60 capsule 11  . digoxin (LANOXIN) 0.125 MG tablet Take 1 tablet (0.125 mg total) by mouth daily. 30 tablet 1  . gemfibrozil (LOPID) 600 MG tablet Take 1 tablet (600 mg total) by mouth 2 (two) times daily before a meal. 60 tablet 1  . HYDROcodone-acetaminophen (NORCO/VICODIN) 5-325 MG tablet Take 1-2 tablets by mouth every 4 (four) hours as needed for moderate pain. 90 tablet 0  . insulin detemir (LEVEMIR) 100 UNIT/ML injection Inject 0.5 mLs (50 Units total) into the skin at bedtime. 10 mL 11  . LITE TOUCH PEN NEEDLES 31G X 5 MM MISC USE AS DIRECTED UP TO 5 INJECTIONS DAILY  7  . losartan (COZAAR) 25 MG tablet TAKE ONE TABLET BY MOUTH DAILY 30 tablet 6  . metFORMIN (GLUCOPHAGE) 1000 MG tablet Take 1,000 mg by mouth 2 (two) times daily.  1    . methocarbamol (ROBAXIN) 500 MG tablet Take 1 tablet (500 mg total) by mouth every 6 (six) hours as needed for muscle spasms. 60 tablet 0  . metolazone (ZAROXOLYN) 5 MG tablet Take 1 tablet (5 mg total) every Monday, Wednesday and Friday. 30 tablet 6  . metoprolol (LOPRESSOR) 50 MG tablet TAKE 1 AND 1/2 TABLETS BY MOUTH TWICE DAILY. 270 tablet 3  . Multiple Vitamin (MULTIVITAMIN WITH MINERALS) TABS tablet Take 1 tablet by mouth daily.    [name] NOVOLOG FLEXPEN 100 UNIT/ML FlexPen INJECT SUBCUTANEOUSLY 25 UNITS IN THE MORNING AND 25 AT NOON AND 35 UNITS AT DINNER  6  . prazosin (MINIPRESS) 1 MG capsule TAKE ONE CAPSULE BY MOUTH TWICE DAILY. 60 capsule 6  . spironolactone (ALDACTONE) 25 MG tablet Take 1 tablet (25 mg total) by mouth daily. 90 tablet 3  . torsemide (DEMADEX) 20 MG tablet TAKE TWO (2) TABLETS BY MOUTH TWICE DAILY. 120 tablet 11  . ULTICARE MINI PEN NEEDLES 31G X 6 MM MISC 1 each by Does not apply route daily.      No current facility-administered medications for this visit.     Past Medical History:  Diagnosis Date  . Atrial flutter ([facility])  10/01/2006, 12/30/11   Status post RF ablation, 7/13, Dr. [name]  . Chronic systolic dysfunction of left ventricle    EF 20-25%, global HK; mild RVD, 2-D echo,  5/13  . Hypertension   . Morbid obesity ([facility])   . Nonischemic cardiomyopathy ([facility])    Normal coronary arteries, 2008  . OSA (obstructive sleep apnea)    compliant with CPAP  . Pulmonary hypertension ([facility])    RVSP 50-55 mmHg, 5/13  . RBBB, anterior fascicular block and incomplete LBBB   . Type II or unspecified type diabetes mellitus without mention of complication, not stated as uncontrolled   . Unspecified disorder of kidney and ureter   . Valvular heart disease    Mild MR/TR, 2-D echo, 5/13    Past Surgical History:  Procedure Laterality Date  . AMPUTATION Right 05/21/2015   Procedure: AMPUTATION BELOW KNEE;  Surgeon: Newt Minion, MD;  Location: [facility];  Service: Orthopedics;   Laterality: Right;  . ATRIAL FLUTTER ABLATION N/A 03/04/2012   Procedure: ATRIAL FLUTTER ABLATION;  Surgeon: Thompson Grayer, MD;  Location: [facility] CATH LAB;  Service: Cardiovascular;  Laterality: N/A;  . BI-VENTRICULAR IMPLANTABLE CARDIOVERTER DEFIBRILLATOR N/A 10/27/2013   Procedure: BI-VENTRICULAR IMPLANTABLE CARDIOVERTER DEFIBRILLATOR  (CRT-D);  Surgeon: Coralyn Mark, MD;  Location: [facility] C[facility] LAB;  Service: Cardiovascular;  Laterality: N/A;  . BI-VENTRICULAR IMPLANTABLE CARDIOVERTER DEFIBRILLATOR  (CRT-D)  10/27/2013   MDT [name] XT CRTD implanted by [name] for NICM, CHF  . CATARACT EXTRACTION W/PHACO Right 03/25/2015   Procedure: CATARACT EXTRACTION PHACO AND INTRAOCULAR LENS PLACEMENT RIGHT EYE CDE=12.24;  Surgeon: Tonny Branch, MD;  Location: [facility];  Service: Ophthalmology;  Laterality: Right;  . CYSTOSCOPY/RETROGRADE/URETEROSCOPY Bilateral 10/16/2014   Procedure: CYSTO WITH BILATERAL Freddie Breech WITH GYRUS;  Surgeon: Malka So, MD;  Location: [facility];  Service: Urology;  Laterality: Bilateral;  . TRANSURETHRAL RESECTION OF BLADDER TUMOR WITH GYRUS (TURBT-GYRUS) N/A 10/16/2014   Procedure: TRANSURETHRAL RESECTION OF BLADDER TUMOR WITH GYRUS (TURBT-GYRUS);  Surgeon: Malka So, MD;  Location: [facility];  Service: Urology;  Laterality: N/A;    Social History   Social History  . Marital status: Single    Spouse name: N/A  . Number of children: N/A  . Years of education: N/A   Occupational History  . DISABLED    Social History Main Topics  . Smoking status: Never Smoker  . Smokeless tobacco: Never Used  . Alcohol use No  . Drug use: No  . Sexual activity: No   Other Topics Concern  . Not on file   Social History Narrative   Pt lives in [place] alone.  Disabled.  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     Vitals:   04/13/17 1339  BP: 140/74  Pulse: (!) 105  SpO2: 98%  Weight: 260 lb (117.9 kg)  Height: 5\' 10"  (1.778 m)    Wt Readings from Last 3 Encounters:   04/13/17 260 lb (117.9 kg)  12/24/16 276 lb (125.2 kg)  11/12/16 294 lb (133.4 kg)     PHYSICAL EXAM (unchanged) General: NAD HEENT: Normal. Neck: No JVD, no thyromegaly. Lungs: Clear. CV: Regular rate and rhythm, normal L8/VFIEP S2, 1/6 systolic murmur along left sternal border and over RUSB. Right leg BKA. Left leg with trace edema. Abdomen: Firm, non distended, obese. Neurologic: Alert and oriented x 3.  Psych: Normal affect. Skin: Stasis dermatitis of left leg with chronic erythema. Musculoskeletal: Right BKA.    ECG: Most recent ECG reviewed.   Labs: Lab Results  Component Value Date/Time   K 4.1 04/15/2016 10:06 AM   BUN 49 (H) 04/15/2016 10:06 AM   CREATININE 1.29 04/15/2016  10:06 AM   ALT 19 05/24/2015 05:20 AM   HGB 9.1 (L) 06/07/2015 05:05 AM     Lipids: No results found for: LDLCALC, LDLDIRECT, CHOL, TRIG, HDL     ASSESSMENT AND PLAN:  1. Chronic systolic heart failure/nonischemic cardiomyopathy s/p CRTD, EF 35%: Appears euvolemic on current diuretic regimen. No changes to therapy.  2. Essential HTN: Mildly elevated. Will monitor.  3. Atrial fibrillation: He is appropriately anticoagulated. No changes to therapy.  4. CRT-D: Device interrogation on 03/15/17 demonstrated normal device function with 6 beats of NSVT.  5. CKD stage 3: Stable.     Disposition: Follow up 6 months   Kate Sable, M.D., F.A.C.C.

## 2017-04-13 NOTE — Patient Instructions (Signed)
Medication Instructions:  Your physician recommends that you continue on your current medications as directed. Please refer to the Current Medication list given to you today.  Labwork: none  Testing/Procedures: none  Follow-Up: Your physician wants you to follow-up in: [place] DR. Bronson Ing You will receive a reminder letter in the mail two months in advance. If you don't receive a letter, please call our office to schedule the follow-up appointment.  Any Other Special Instructions Will Be Listed Below (If Applicable).  If you need a refill on your cardiac medications before your next appointment, please call your pharmacy.

## 2017-04-16 ENCOUNTER — Telehealth: Payer: Self-pay

## 2017-04-16 ENCOUNTER — Ambulatory Visit (INDEPENDENT_AMBULATORY_CARE_PROVIDER_SITE_OTHER): Payer: Medicare Other

## 2017-04-16 DIAGNOSIS — I5022 Chronic systolic (congestive) heart failure: Secondary | ICD-10-CM

## 2017-04-16 DIAGNOSIS — Z95810 Presence of automatic (implantable) cardiac defibrillator: Secondary | ICD-10-CM

## 2017-04-16 NOTE — Telephone Encounter (Signed)
Remote ICM transmission received.  Attempted call to patient and no message. 

## 2017-04-16 NOTE — Progress Notes (Signed)
EPIC Encounter for ICM Monitoring  Patient Name: [name] is a 56 y.o. male Date: 04/16/2017 Primary Care Physican: [name], MD Primary [place] Electrophysiologist: Allred Dry Weight:Previous ICM weight 263lbs  Bi-V Pacing: 99.7%      Attempted call to patient and unable to reach.   Transmission reviewed.    Thoracic impedance almost at baseline normal today.  Prescribed dosage: Torsemide 20 mg 2 tablets (40 mg total) twice a day. Metolazone 5 mg 1 tablet Monday, Wednesday and Friday.  Labs: 12/03/2016 Creatinine 1.63, BUN 50, Potassium 4.7, Sodium 139, EGFR 44-53 11/23/2016 Creatinine 1.68, BUN 50, Potassium 4.7 Sodium 141, EGFR 42-51 10/13/2017Creatinine 1.30, BUN 51, Potassium 4.2, Sodium 138, EGFR 57->60 04/16/2016 Creatinine 1.29, BUN 49, Potassium 4.1, Sodium 136  01/22/2016 Creatinine 1.34, BUN 37, Potassium 4.3, Sodium 136, EGFR 55->60  09/17/2015 Creatinine 0.92, BUN 25, Potassium 4.7, Sodium 137, EGFR >60  Recommendations: NONE - Unable to reach patient   Follow-up plan: ICM clinic phone appointment on 06/21/2017.  Office appointment scheduled 05/21/2017 with Dr. [name].  Copy of ICM check sent to Dr. [name]   3 month ICM trend: 04/16/2017   1 Year ICM trend:      Rosalene Billings, RN 04/16/2017 10:13 AM

## 2017-04-20 ENCOUNTER — Other Ambulatory Visit: Payer: Self-pay | Admitting: Cardiovascular Disease

## 2017-04-20 DIAGNOSIS — I5022 Chronic systolic (congestive) heart failure: Secondary | ICD-10-CM

## 2017-04-23 ENCOUNTER — Other Ambulatory Visit: Payer: Self-pay | Admitting: Cardiovascular Disease

## 2017-05-17 DEATH — deceased

## 2017-05-21 ENCOUNTER — Encounter: Payer: Medicare Other | Admitting: Internal Medicine
